# Patient Record
Sex: Female | Born: 1935
Health system: Southern US, Community
[De-identification: ages and names within clinical notes are randomized; demographics above are authoritative.]

## PROBLEM LIST (undated history)

## (undated) DIAGNOSIS — Z13820 Encounter for screening for osteoporosis: Secondary | ICD-10-CM

## (undated) DIAGNOSIS — E039 Hypothyroidism, unspecified: Secondary | ICD-10-CM

## (undated) DIAGNOSIS — J449 Chronic obstructive pulmonary disease, unspecified: Secondary | ICD-10-CM

## (undated) DIAGNOSIS — K219 Gastro-esophageal reflux disease without esophagitis: Secondary | ICD-10-CM

## (undated) DIAGNOSIS — I2510 Atherosclerotic heart disease of native coronary artery without angina pectoris: Secondary | ICD-10-CM

## (undated) DIAGNOSIS — I10 Essential (primary) hypertension: Secondary | ICD-10-CM

## (undated) DIAGNOSIS — D649 Anemia, unspecified: Secondary | ICD-10-CM

## (undated) DIAGNOSIS — K5790 Diverticulosis of intestine, part unspecified, without perforation or abscess without bleeding: Secondary | ICD-10-CM

## (undated) DIAGNOSIS — Z1231 Encounter for screening mammogram for malignant neoplasm of breast: Secondary | ICD-10-CM

## (undated) DIAGNOSIS — R918 Other nonspecific abnormal finding of lung field: Secondary | ICD-10-CM

## (undated) DIAGNOSIS — R0609 Other forms of dyspnea: Secondary | ICD-10-CM

## (undated) DIAGNOSIS — R1033 Periumbilical pain: Secondary | ICD-10-CM

## (undated) DIAGNOSIS — M1990 Unspecified osteoarthritis, unspecified site: Secondary | ICD-10-CM

## (undated) DIAGNOSIS — R059 Cough, unspecified: Secondary | ICD-10-CM

## (undated) HISTORY — DX: Atherosclerotic heart disease of native coronary artery without angina pectoris: I25.10

## (undated) HISTORY — PX: APPENDECTOMY: SHX54

## (undated) HISTORY — DX: Gastro-esophageal reflux disease without esophagitis: K21.9

## (undated) HISTORY — DX: Chronic obstructive pulmonary disease, unspecified: J44.9

## (undated) HISTORY — DX: Essential (primary) hypertension: I10

## (undated) HISTORY — PX: CHOLECYSTECTOMY: SHX55

## (undated) HISTORY — DX: Anemia, unspecified: D64.9

## (undated) HISTORY — DX: Diverticulosis of intestine, part unspecified, without perforation or abscess without bleeding: K57.90

## (undated) HISTORY — PX: TOTAL ABDOMINAL HYSTERECTOMY: SHX209

## (undated) HISTORY — PX: NISSEN FUNDOPLICATION: SHX2091

## (undated) HISTORY — PX: COLON SURGERY: SHX602

## (undated) HISTORY — PX: CORONARY ANGIOPLASTY WITH STENT PLACEMENT: SHX49

## (undated) HISTORY — DX: Hypothyroidism, unspecified: E03.9

---

## 1981-01-31 HISTORY — PX: BREAST SURGERY: SHX581

## 2001-04-09 LAB — HIV ANTIBODY (ROUTINE TESTING W REFLEX): HIV 1&2 Ab, 4th Generation: NEGATIVE

## 2004-02-05 LAB — TSH: TSH: 2.61 (ref <=5.90)

## 2006-07-21 NOTE — H&P (Unsigned)
[facility]   [address]   [place], [place]. [address]   [phone]     HISTORY AND PHYSICAL    NAME: Natalie Moon, Natalie Moon MR: [id number]  [place]  LOC: SEXEduardo Osier: [id number]  DOB: 21-Sep-1929 AGE: 73 PT: I  ADMIT: DSCH: MSV: SUR    DATE OF ADMISSION: 08/07/06    REASON FOR ADMISSION: Dysphagia related to a hiatal hernia with  paraesophageal component.    HISTORY: This is a 73 year old female who was originally referred to  our office by Dr. Jonell Cluck of gastroenterology for evaluation of a hiatal  hernia with a paraesophageal component. The patient had a CT scan done  on 06/14/06 which showed a moderate-sized hiatal hernia with a  paraesophageal component. She had an EGD done by Dr. Jonell Cluck the results  of which showed a hiatal hernia and gastritis. The patient then had a  barium esophagram done on 07/17/06 which showed a moderate-sized hiatal  hernia with paraesophageal component. The patient reports that she has  been having nausea and dysphagia. She says she can only eat a few bites  and then becomes full. She denies vomiting but regurgitates quite a bit.  She says that she eats and then she starts to cough and gets choked up  after eating. She regurgitates when she bends over or at night. She has  several episodes of laryngitis per year. She denies any problem with  bowel movement and apparently had a CT enterography as per Dr. Margarita Rana  office which showed no problems with the small bowel. The patient was  referred to our office for evaluation and I recommended a laparoscopic  possible open hiatal hernia repair as well as floppy Nissen  fundoplication.    PAST MEDICAL HISTORY: Significant for arthritis, gastroesophageal reflux  disease, hypertension, hyperlipidemia. She denies diabetes, coronary  artery disease, CVA or seizure disorder.    PAST SURGICAL HISTORY: She has had a tonsillectomy, hysterectomy, and  both of her ovaries removed later during the 70's. She has had a   cholecystectomy, colon resection in 2004 and surgery for a broken arm in  2006.    ALLERGIES: No known drug allergies. No reaction to penicillin.    MEDICATIONS:   1. Nexium   2. Celebrex   3. Atenolol   4. Fluoxetine   5. Zetia   6. ____________   7. Tramadol    She could not remember any of the doses.    She denies any tobacco or alcohol use.    General appearance is a well-developed, well-nourished elderly white  female presently in no acute distress. Heart sinus rhythm, S1 and S2  normal. The lungs are clear with no rales, rhonchi or wheezing noted at  this time. Abdomen was soft, mild epigastric tenderness. Well-healed  midline incision and well-healed lower abdominal incision. Active bowel  sounds were heard on auscultation.    ASSESSMENT: Hiatal hernia with paraesophageal component which is most  likely producing her nausea, dysphagia and regurgitation.    PLAN: I recommended laparoscopic possible open hiatal hernia repair with  a floppy Nissen fundoplication. I went through the procedures, the risks  of bleeding, infection, anesthesia, injury to the esophagus, stomach,  small bowel, colon and spleen. I told her that if the spleen were  injured it may require removal. I also went through some of the potential  problems after surgery such as gas-bloat syndrome, poor gastric emptying  and the potential need for further surgery. I went through a  post-operative diet recommending six small meals and avoidance of  carbonated beverages. The patient understood and wished to proceed and  the procedure has been scheduled for 08/07/06.                 __________________________________   Milda Smart. Dareen Piano, MD A           This is an unverified document unless signed by physician.    TID: cls DT: 07/21/2006 11:20 A  JOB: [id number] DOC#: [id number] DD: 07/21/2006     cc: Milda Smart. Dareen Piano, MD

## 2006-08-02 LAB — METABOLIC PANEL, BASIC
Anion gap: 7 (ref 7–16)
BUN: 19 MG/DL (ref 8–23)
CO2: 29 MMOL/L (ref 21–32)
Calcium: 9 MG/DL (ref 8.4–10.4)
Chloride: 100 MMOL/L (ref 98–107)
Creatinine: 0.9 MG/DL (ref 0.6–1.0)
GFR est AA: >60 mL/min/{1.73_m2} (ref >60)
GFR est non-AA: >60 mL/min/{1.73_m2} (ref >60)
Glucose: 82 MG/DL (ref 74–106)
Potassium: 3.8 MMOL/L (ref 3.5–5.1)
Sodium: 136 MMOL/L (ref 136–145)

## 2006-08-02 LAB — CBC W/O DIFF
HCT: 36.5 % (ref 35.6–45.0)
HGB: 12.5 g/dL (ref 11.7–15.0)
MCH: 32 PG (ref 26.1–32.9)
MCHC: 34.2 g/dL (ref 31.4–35.0)
MCV: 93.4 FL (ref 79.6–97.8)
MPV: 8 FL — ABNORMAL LOW (ref 9.3–12.9)
PLATELET: 235 10*3/uL (ref 140–440)
RBC: 3.91 M/uL (ref 3.86–5.18)
RDW: 13.4 % (ref 11.9–14.6)
WBC: 4.3 10*3/uL — ABNORMAL LOW (ref 4.5–10.5)

## 2006-08-07 ENCOUNTER — Inpatient Hospital Stay
Admit: 2006-08-07 | Discharge: 2006-08-10 | Disposition: A | Discharge status: Home / Self Care | Source: Home / Self Care | Attending: Surgery | Admitting: Surgery

## 2006-08-07 MED ORDER — HYDROMORPHONE 2 MG/ML INJECTION SOLUTION
2 mg/mL | INTRAMUSCULAR | Status: DC | PRN
Start: 2006-08-07 — End: 2006-08-07
  Administered 2006-08-07 (×4): via INTRAVENOUS

## 2006-08-07 MED ORDER — KETOROLAC TROMETHAMINE 30 MG/ML INJECTION
30 mg/mL (1 mL) | INTRAMUSCULAR | Status: DC | PRN
Start: 2006-08-07 — End: 2006-08-07
  Administered 2006-08-07: 17:00:00 via INTRAVENOUS

## 2006-08-07 MED ORDER — HYDROMORPHONE 2 MG/ML INJECTION SOLUTION
2 mg/mL | INTRAMUSCULAR | Status: DC | PRN
Start: 2006-08-07 — End: 2006-08-07
  Administered 2006-08-07 (×2): via INTRAVENOUS

## 2006-08-07 MED ORDER — KETOROLAC TROMETHAMINE 30 MG/ML INJECTION
30 mg/mL (1 mL) | Freq: Four times a day (QID) | INTRAMUSCULAR | Status: DC | PRN
Start: 2006-08-07 — End: 2006-08-09

## 2006-08-07 MED ORDER — LACTATED RINGERS IV
INTRAVENOUS | Status: DC
Start: 2006-08-07 — End: 2006-08-07

## 2006-08-07 MED ORDER — D5-1/2 NS & POTASSIUM CHLORIDE 20 MEQ/L IV
20 mEq/L | INTRAVENOUS | Status: DC
Start: 2006-08-07 — End: 2006-08-09
  Administered 2006-08-07 – 2006-08-09 (×3): via INTRAVENOUS

## 2006-08-07 MED ORDER — TRAMADOL 50 MG TAB
50 mg | Freq: Every day | ORAL | Status: DC
Start: 2006-08-07 — End: 2006-08-07

## 2006-08-07 MED ORDER — HYDROMORPHONE 2 MG/ML INJECTION SOLUTION
2 mg/mL | INTRAMUSCULAR | Status: DC | PRN
Start: 2006-08-07 — End: 2006-08-10
  Administered 2006-08-07 – 2006-08-09 (×6): via INTRAVENOUS

## 2006-08-07 MED ORDER — PANTOPRAZOLE 40 MG IV SOLR
40 mg | INTRAVENOUS | Status: DC
Start: 2006-08-07 — End: 2006-08-09
  Administered 2006-08-07 – 2006-08-09 (×2): via INTRAVENOUS

## 2006-08-07 MED ORDER — PHENOL-PHENOLATE SODIUM AEROSOL
Status: DC | PRN
Start: 2006-08-07 — End: 2006-08-10
  Administered 2006-08-07: via ORAL

## 2006-08-07 MED ORDER — SYNTHETIC CONJ ESTROGENS A 0.625 MG TAB
0.625 mg | Freq: Every day | ORAL | Status: DC
Start: 2006-08-07 — End: 2006-08-10
  Administered 2006-08-10: 13:00:00 via ORAL

## 2006-08-07 MED ORDER — OXYCODONE-ACETAMINOPHEN 5 MG-325 MG TAB
5-325 mg | ORAL | Status: DC | PRN
Start: 2006-08-07 — End: 2006-08-10
  Administered 2006-08-09 – 2006-08-10 (×3): via ORAL

## 2006-08-07 MED ORDER — ENOXAPARIN 40 MG/0.4 ML SUB-Q SYRINGE
40 mg/0.4 mL | Freq: Every day | SUBCUTANEOUS | Status: DC
Start: 2006-08-07 — End: 2006-08-10
  Administered 2006-08-07 – 2006-08-10 (×4): via SUBCUTANEOUS

## 2006-08-07 MED ORDER — CEFAZOLIN 1 GRAM SOLUTION FOR INJECTION
1 gram | Freq: Three times a day (TID) | INTRAMUSCULAR | Status: AC
Start: 2006-08-07 — End: 2006-08-08
  Administered 2006-08-07 – 2006-08-08 (×3): via INTRAVENOUS

## 2006-08-07 MED ORDER — LIDOCAINE (PF) 20 MG/ML (2 %) IV SYRINGE
100 mg/5 mL (2 %) | INTRAVENOUS | Status: DC | PRN
Start: 2006-08-07 — End: 2006-08-07

## 2006-08-07 MED ORDER — FLUOXETINE 20 MG CAP
20 mg | Freq: Two times a day (BID) | ORAL | Status: DC
Start: 2006-08-07 — End: 2006-08-10
  Administered 2006-08-08 (×2): via ORAL
  Administered 2006-08-09: 22:00:00
  Administered 2006-08-09 – 2006-08-10 (×3): via ORAL

## 2006-08-07 MED ORDER — HYDROCODONE-ACETAMINOPHEN 10 MG-500 MG TAB
10-500 mg | ORAL | Status: DC | PRN
Start: 2006-08-07 — End: 2006-08-07

## 2006-08-07 MED ORDER — OXYCODONE-ACETAMINOPHEN 5 MG-325 MG TAB
5-325 mg | ORAL | Status: DC | PRN
Start: 2006-08-07 — End: 2006-08-10

## 2006-08-07 MED ORDER — ATENOLOL 25 MG TAB
25 mg | Freq: Every day | ORAL | Status: DC
Start: 2006-08-07 — End: 2006-08-09
  Administered 2006-08-08: 14:00:00 via ORAL

## 2006-08-07 MED ORDER — PROMETHAZINE 25 MG/ML INJECTION
25 mg/mL | INTRAMUSCULAR | Status: DC | PRN
Start: 2006-08-07 — End: 2006-08-07

## 2006-08-07 MED ORDER — ATROPINE 0.4 MG/ML IJ SOLN
0.4 mg/mL | INTRAMUSCULAR | Status: DC | PRN
Start: 2006-08-07 — End: 2006-08-07

## 2006-08-07 MED ADMIN — cefazolin (ANCEF) 1 gram/50 mL IVPB: INTRAVENTRICULAR_CARDIAC | @ 12:00:00 | NDC 63323023765

## 2006-08-07 MED ADMIN — midazolam (VERSED) 1 mg/mL injection: INTRAVENOUS | @ 12:00:00 | NDC 66758001802

## 2006-08-07 MED FILL — HYDROMORPHONE 2 MG/ML INJECTION SOLUTION: 2 mg/mL | INTRAMUSCULAR | Qty: 1

## 2006-08-07 MED FILL — TRAMADOL 50 MG TAB: 50 mg | ORAL | Qty: 1

## 2006-08-07 MED FILL — SORE THROAT SPRAY: Qty: 177

## 2006-08-07 MED FILL — D5-1/2 NS & POTASSIUM CHLORIDE 20 MEQ/L IV: 20 mEq/L | INTRAVENOUS | Qty: 1000

## 2006-08-07 MED FILL — LOVENOX 40 MG/0.4 ML SUBCUTANEOUS SYRINGE: 40 mg/0.4 mL | SUBCUTANEOUS | Qty: 0.4

## 2006-08-07 MED FILL — SODIUM CHLORIDE 0.9 % IV PIGGY BACK: INTRAVENOUS | Qty: 50

## 2006-08-07 MED FILL — FLUOXETINE 20 MG CAP: 20 mg | ORAL | Qty: 1

## 2006-08-07 MED FILL — CEFAZOLIN 1 GRAM SOLUTION FOR INJECTION: 1 gram | INTRAMUSCULAR | Qty: 1000

## 2006-08-07 MED FILL — ATENOLOL 25 MG TAB: 25 mg | ORAL | Qty: 2

## 2006-08-07 MED FILL — CEFAZOLIN 1 GRAM/50 ML IN DEXTROSE (ISO-OSMOTIC) IVPB: 1 gram/50 mL | INTRAVENOUS | Qty: 1

## 2006-08-07 MED FILL — MIDAZOLAM 1 MG/ML IJ SOLN: 1 mg/mL | INTRAMUSCULAR | Qty: 2

## 2006-08-07 MED FILL — ROXICET 5 MG-325 MG TABLET: 5-325 mg | ORAL | Qty: 1

## 2006-08-07 MED FILL — PROTONIX 40 MG INTRAVENOUS SOLUTION: 40 mg | INTRAVENOUS | Qty: 40

## 2006-08-07 MED FILL — KETOROLAC TROMETHAMINE 30 MG/ML INJECTION: 30 mg/mL (1 mL) | INTRAMUSCULAR | Qty: 1

## 2006-08-07 MED FILL — ROXICET 5 MG-325 MG TABLET: 5-325 mg | ORAL | Qty: 2

## 2006-08-07 NOTE — Progress Notes (Unsigned)
[facility]   One [address]   [place], [place]. [address]   [phone]     OPERATIVE REPORT    NAME: Genie, Wenke MR: [id number]  Mason  LOC: [id number] SEX: F ACCT: [id number]  DOB: September 12, 1929 AGE: 73 PT: I  ADMIT: 08/07/2006 DSCH: MSV: MED    DATE OF PROCEDURE: 08/07/2006    PREOPERATIVE DIAGNOSIS: Hiatal hernia with a paraesophageal component  plus dysphagia.    PROCEDURE: Laparoscopic hiatal hernia repair with Nissen fundoplication  (floppy Nissen).    SURGEON: Milda Smart. Dareen Piano, MD    ANESTHESIA: General endotracheal anesthesia.    ESTIMATED BLOOD LOSS: 100 mL.    SPECIMENS: None.    INDICATIONS FOR PROCEDURE: This is a 73 year old female who was referred  to my office by Dr. Jonell Cluck of gastroenterology for evaluation of a  hiatal hernia with paraesophageal component. CT scan showed moderate  hiatal hernia with paraesophageal component. She had an  esophagogastroduodenoscopy done which showed a hiatal hernia and  gastritis. Barium swallow done showed a moderate sized hiatal hernia  with paraesophageal component. She had been having nausea as well as  dysphagia. She states she could eat only a few bites and then became  full. She regurgitated quite a bit but denied any vomiting. She was  mostly afraid to eat. I recommended laparoscopic, possible open, hiatal  hernia repair with Nissen fundoplication. I went through the procedure,  the risks of bleeding, infection, anesthesia, and injury to the  esophagus, stomach, spleen, liver, small bowel, and large bowel. I went  through the potential for swelling, distention, and poor emptying of the  stomach. I went through the potential for postoperative nausea and  vomiting. I went through the potential for conversion to an open  procedure. The patient agreed to the procedure, signed a consent form  and was scheduled for today.    DESCRIPTION OF PROCEDURE: The patient was brought to the operating room   and placed on the operating table in the supine position where general  endotracheal anesthesia was administered without complications. She  received a gram of Ancef as prophylactic antibiotic coverage. Foley  catheter was inserted by the nursing staff. The abdomen was prepped and  draped in the usual sterile manner. Incision was made superior into the  patient's left of the umbilicus overlying the left rectus abdominus  muscle. An Optiview trocar with a 0-degree 10 mm scope was placed into  the peritoneal cavity under direct vision. The abdomen was insufflated  to 15 mmHg using CO2 gas after which a 45-degree laparoscope was inserted  into the abdomen. Under direct vision we placed a 5 mm and a 10 mm  trocar in the right upper quadrant. This was followed by a 5 mm trocar  in the left upper quadrant. The 5 mm trocar was placed in the epigastric  region and then removed and through this tract a Nathanson liver  retractor was used to retract the left lobe of the liver throughout the  remainder of the procedure. The [facility] liver retractor was attached  to an Omni self-retaining retracting device. The patient had a large  hiatal hernia with approximately the upper one-third of the stomach up in  the thoracic cavity. Using the 5 mm Harmonic scalpel, I opened the  gastrohepatic ligament. I exposed the right cruz of the diaphragm and  divided the hiatal hernia sac along the 6 to 12 o'clock position using  the 5 mm Harmonic scalpel.    We  then turned our attention to the greater curvature of the stomach  dividing the gastrocolic ligament without problems. This was done with a  5 mm Harmonic scalpel. When we got up to the spleen one of the short  gastrics, even though we had used the Harmonic scalpel, continued to  bleed. I clipped this three times and the bleeding ceased. We then took  down the hiatal hernia sac from the 12 o'clock position down to the 6   o'clock position for circumferential take-down of the hiatal hernia sac.  Both the left and right cruz of the diaphragm were cleared of their  surrounding tissue. A 50 French bougie was placed by the nurse  anesthetist under direct vision. My assistant retracted the esophagus,  and I closed the hiatal hernia repair with 3-0 Ethibond sutures using an  intracorporeal knot tying technique. We then did a 360-degree Nissen  fundoplication. This was done by bringing the fundus posterior and doing  a 360-degree wrap. The first suture was taken with 0-0 Ethibond from the  fundus to the phrenoesophageal membrane to the fundus, and this is the  first suture. Two remaining sutures for a 3 cm wrap were completed all  with 0-0 Ethibond. We did a lap instrument and needle count, and they  were all found to be correct. We irrigated and checked for evidence of  bleeding. No bleeding was noted at any of the sites. The bougie was  removed under direct vision. The [facility] liver retractor was removed  under direct vision without evidence of bleeding. All of the trocars  were removed under direct vision without bleeding at the trocar sites.  The skin incisions were closed with surgical clips. The patient had  Band-Aids placed over each wound. The patient tolerated the entire  procedure well. She was extubated and brought to the recovery room in  stable condition.            ____________________________________David G. Dareen Piano, MD A     This is an unverified document unless signed by physician.    TID: rhj DT: 08/07/2006 7:39 P  JOB: [id number] DOC#: [id number] DD: 08/07/2006    cc: Milda Smart. Dareen Piano, MD   Wilford Corner, MD

## 2006-08-08 LAB — METABOLIC PANEL, BASIC
Anion gap: 4 — ABNORMAL LOW (ref 7–16)
BUN: 13 MG/DL (ref 8–23)
CO2: 31 MMOL/L (ref 21–32)
Calcium: 8.5 MG/DL (ref 8.4–10.4)
Chloride: 102 MMOL/L (ref 98–107)
Creatinine: 0.9 MG/DL (ref 0.6–1.0)
GFR est AA: >60 mL/min/{1.73_m2} (ref >60)
GFR est non-AA: >60 mL/min/{1.73_m2} (ref >60)
Glucose: 114 MG/DL — ABNORMAL HIGH (ref 74–106)
Potassium: 4.7 MMOL/L (ref 3.5–5.1)
Sodium: 137 MMOL/L (ref 136–145)

## 2006-08-08 LAB — CBC W/O DIFF
HCT: 32.6 % — ABNORMAL LOW (ref 35.6–45.0)
HGB: 11 g/dL — ABNORMAL LOW (ref 11.7–15.0)
MCH: 31.2 PG (ref 26.1–32.9)
MCHC: 33.7 g/dL (ref 31.4–35.0)
MCV: 92.8 FL (ref 79.6–97.8)
MPV: 7.9 FL — ABNORMAL LOW (ref 9.3–12.9)
PLATELET: 204 10*3/uL (ref 140–440)
RBC: 3.51 M/uL — ABNORMAL LOW (ref 3.86–5.18)
RDW: 13.5 % (ref 11.9–14.6)
WBC: 7 10*3/uL (ref 4.5–10.5)

## 2006-08-08 MED ORDER — ALPRAZOLAM 0.5 MG TAB
0.5 mg | Freq: Three times a day (TID) | ORAL | Status: DC | PRN
Start: 2006-08-08 — End: 2006-08-10
  Administered 2006-08-08: 17:00:00 via ORAL

## 2006-08-08 MED ORDER — VALSARTAN 80 MG TAB
80 mg | Freq: Every day | ORAL | Status: DC | PRN
Start: 2006-08-08 — End: 2006-08-10

## 2006-08-08 MED FILL — FLUOXETINE 20 MG CAP: 20 mg | ORAL | Qty: 1

## 2006-08-08 MED FILL — CEFAZOLIN 1 GRAM SOLUTION FOR INJECTION: 1 gram | INTRAMUSCULAR | Qty: 1000

## 2006-08-08 MED FILL — ATENOLOL 25 MG TAB: 25 mg | ORAL | Qty: 2

## 2006-08-08 MED FILL — HYDROMORPHONE 2 MG/ML INJECTION SOLUTION: 2 mg/mL | INTRAMUSCULAR | Qty: 1

## 2006-08-08 MED FILL — ALPRAZOLAM 0.5 MG TAB: 0.5 mg | ORAL | Qty: 2

## 2006-08-08 MED FILL — SODIUM CHLORIDE 0.9 % IV PIGGY BACK: INTRAVENOUS | Qty: 50

## 2006-08-08 MED FILL — LACTATED RINGERS IV: INTRAVENOUS | Qty: 1000

## 2006-08-08 MED FILL — D5-1/2 NS & POTASSIUM CHLORIDE 20 MEQ/L IV: 20 mEq/L | INTRAVENOUS | Qty: 1000

## 2006-08-08 MED FILL — BUPIVACAINE (PF) 0.5 % (5 MG/ML) IJ SOLN: 0.5 % (5 mg/mL) | INTRAMUSCULAR | Qty: 30

## 2006-08-08 MED FILL — LOVENOX 40 MG/0.4 ML SUBCUTANEOUS SYRINGE: 40 mg/0.4 mL | SUBCUTANEOUS | Qty: 0.4

## 2006-08-09 LAB — METABOLIC PANEL, BASIC
Anion gap: 7 (ref 7–16)
BUN: 8 MG/DL (ref 8–23)
CO2: 28 MMOL/L (ref 21–32)
Calcium: 8.2 MG/DL — ABNORMAL LOW (ref 8.4–10.4)
Chloride: 103 MMOL/L (ref 98–107)
Creatinine: 0.7 MG/DL (ref 0.6–1.0)
GFR est AA: >60 mL/min/{1.73_m2} (ref >60)
GFR est non-AA: >60 mL/min/{1.73_m2} (ref >60)
Glucose: 112 MG/DL — ABNORMAL HIGH (ref 74–106)
Potassium: 3.8 MMOL/L (ref 3.5–5.1)
Sodium: 138 MMOL/L (ref 136–145)

## 2006-08-09 LAB — CBC W/O DIFF
HCT: 29.4 % — ABNORMAL LOW (ref 35.6–45.0)
HGB: 10 g/dL — ABNORMAL LOW (ref 11.7–15.0)
MCH: 31.6 PG (ref 26.1–32.9)
MCHC: 34.1 g/dL (ref 31.4–35.0)
MCV: 92.8 FL (ref 79.6–97.8)
MPV: 8.3 FL — ABNORMAL LOW (ref 9.3–12.9)
PLATELET: 174 10*3/uL (ref 140–440)
RBC: 3.17 M/uL — ABNORMAL LOW (ref 3.86–5.18)
RDW: 13.6 % (ref 11.9–14.6)
WBC: 5.7 10*3/uL (ref 4.5–10.5)

## 2006-08-09 MED ADMIN — atenolol (TENORMIN) tablet 50 mg: ORAL | @ 13:00:00 | NDC 75834028200

## 2006-08-09 MED FILL — ATENOLOL 50 MG TAB: 50 mg | ORAL | Qty: 1

## 2006-08-09 MED FILL — LOVENOX 40 MG/0.4 ML SUBCUTANEOUS SYRINGE: 40 mg/0.4 mL | SUBCUTANEOUS | Qty: 0.4

## 2006-08-09 MED FILL — PROTONIX 40 MG INTRAVENOUS SOLUTION: 40 mg | INTRAVENOUS | Qty: 40

## 2006-08-09 MED FILL — FLUOXETINE 20 MG CAP: 20 mg | ORAL | Qty: 1

## 2006-08-09 MED FILL — OXYCODONE-ACETAMINOPHEN 5 MG-325 MG TAB: 5-325 mg | ORAL | Qty: 1

## 2006-08-09 MED FILL — ATENOLOL 25 MG TAB: 25 mg | ORAL | Qty: 2

## 2006-08-09 MED FILL — D5-1/2 NS & POTASSIUM CHLORIDE 20 MEQ/L IV: 20 mEq/L | INTRAVENOUS | Qty: 1000

## 2006-08-09 MED FILL — HYDROMORPHONE 2 MG/ML INJECTION SOLUTION: 2 mg/mL | INTRAMUSCULAR | Qty: 1

## 2006-08-10 MED ORDER — PANTOPRAZOLE 40 MG TAB, DELAYED RELEASE
40 mg | Freq: Every day | ORAL | Status: DC
Start: 2006-08-10 — End: 2006-08-10
  Administered 2006-08-10: 12:00:00 via ORAL

## 2006-08-10 MED ADMIN — zolpidem (AMBIEN) tablet 10 mg: ORAL | @ 02:00:00 | NDC 76420023360

## 2006-08-10 MED FILL — ZOLPIDEM 5 MG TAB: 5 mg | ORAL | Qty: 2

## 2006-08-10 MED FILL — LOVENOX 40 MG/0.4 ML SUBCUTANEOUS SYRINGE: 40 mg/0.4 mL | SUBCUTANEOUS | Qty: 0.4

## 2006-08-10 MED FILL — PROTONIX 40 MG TABLET,DELAYED RELEASE: 40 mg | ORAL | Qty: 1

## 2006-08-10 MED FILL — FLUOXETINE 20 MG CAP: 20 mg | ORAL | Qty: 1

## 2006-08-10 MED FILL — ATENOLOL 50 MG TAB: 50 mg | ORAL | Qty: 1

## 2006-08-10 MED FILL — OXYCODONE-ACETAMINOPHEN 5 MG-325 MG TAB: 5-325 mg | ORAL | Qty: 1

## 2006-08-11 LAB — GLUCOSE, POC: Glucose (POC): 110 mg/dL (ref 70–110)

## 2006-09-26 NOTE — Discharge Summary (Unsigned)
[facility]   One [address]   [place], [place]. [address]   [phone]     DISCHARGE SUMMARY    NAME: Natalie Moon, Natalie Moon MR: [id number]  LOC: 02 [id number] SEX: F ACCT: [id number]  DOB: 10/19/1929 AGE: 73 PT: I  ADMIT: 08/07/2006 DSCH: 08/10/2006 MSV: MED    ADMITTING DIAGNOSIS: Hiatal hernia with a paraesophageal component plus  dysphagia.    OPERATIONS/PROCEDURES: Laparoscopic hiatal hernia repair with Nissen  fundoplication.    FINDINGS AT THE TIME OF SURGERY: Hiatal hernia with one-third of the  stomach in the thoracic cavity.    POSTOPERATIVE DIAGNOSIS:  1. Hiatal hernia with paraesophageal component plus dysphagia.  2. Postoperative anxiety.    HISTORY OF PRESENT ILLNESS AND HOSPITAL COURSE: The patient is a  73 year old who I was referred for a large hiatal hernia. She was having  dysphagia. She went through the usual workup of  esophagogastroduodenoscopy, barium swallow, and she also had some other  tests done. I recommended repair of this. It was scheduled for  08/07/2006 and was done without problems. This was laparoscopic hiatal  hernia repair with Nissen fundoplication. After surgery, she reported  some incisional tenderness. There was no dysphagia, nausea, or vomiting  noted. I started her on clear liquids. I discontinued her oxygen. We  got her out of bed. We had her ambulate. Physical therapy consultation  was ordered. We renewed her home medications. She was quite tearful and  anxious the first day after surgery. She thought she was going to die.  We started her on some antianxiety medications. We had the chaplain see  her. I came by and saw her. I asked her family to come in and visit  with her. When I saw her the next day, she said that she felt like she  had a panic attack and felt better after taking some Ativan. On  postoperative day number two, I gave her a soft diet. I discontinued her  intravenous fluids. On postoperative day number three, she felt better.   She wanted to go home. She was sent home on 08/10/2006.    DISCHARGE INSTRUCTIONS: The patient is discharged to home.    DISCHARGE CONDITION: Stable.    DISCHARGE DIET: Six small meals per day.    FOLLOWUP INTERVAL: She is to follow up with me on 08/15/2006.    DISCHARGE MEDICATIONS:  1. Percocet.  2. Phenergan.  3. She was told to resume all of her home medications.    DISCHARGE ACTIVITY: She was told that she could walk, climb stairs, and  shower but no tub baths, swimming, or heavy lifting.                 __________________________________   Milda Smart. Dareen Piano, MD A     This is an unverified document unless signed by physician.    TID: ach DT: 09/26/2006 11:52 A  JOB: [id number] DOC#: [id number] DD: 09/15/2006    cc: Milda Smart. Dareen Piano, MD

## 2007-01-24 NOTE — H&P (Unsigned)
[facility] [place] [facility] [facility]   One [address]   [place], [place]. [address]   [phone]     HISTORY AND PHYSICAL    NAME: Natalie Moon, Natalie Moon MR: [id number]  Natalie Moon  LOC: EN SEXEduardo Osier: [id number]  DOB: [birth year] AGE: 73 PT: X  ADMIT: DSCH: MSV: GI    DATE OF ADMISSION: 02-08-07    REASON FOR ADMISSION: EGD secondary to dysphagia.    HISTORY:  This is a 73 year old female who was admitted in July 2008 secondary to  dysphagia. She had a large hiatal hernia with paraesophageal component  and underwent a laparoscopic hiatal hernia repair, Nissen fundoplication.  Since that time she's reported some intermittent dysphagia and inability  to eat large amounts at any one time. She's become quite concerned and I  recommended an EGD to further evaluate her esophagus and hiatal hernia  repair.    PAST MEDICAL HISTORY:   1. Arthritis.   2. Gastroesophageal reflux disease in the past.   3. Hypertension.   4. Hyperlipidemia.    She denies diabetes, coronary artery disease, CVA or seizure disorder.    PAST SURGICAL HISTORY:   1. Tonsillectomy.   2. Hysterectomy.   3. Both of her ovaries removed in the 1970's.   4. Cholecystectomy.   5. Colon resection in 2004.   6. Surgery for a broken arm in 2006.   7. Laparoscopic Nissen fundoplication with hiatal hernia repair in   July 2008.    ALLERGIES: No known drug allergies. No reaction to penicillin.  MEDICATIONS:  Nexium, Celebrex, atenolol, fluoxetine, Zetia, Tramadol. She did not  remember any of the doses.    SOCIAL HISTORY: She denies any tobacco or alcohol use.    General: well developed, well nourished elderly white female presently in  no acute distress. Heart: sinus rhythm, S1, S2 normal. Lungs are clear.  No rales, rhonchi or wheezes. Abdomen soft, non-tender, non-distended.  Well healed abdominal incisions. She had active bowel sounds on  auscultation.    ASSESSMENT: Dysphagia after hiatal hernia repair and Nissen  fundoplication.    PLAN:   I recommended an EGD to rule out stricture, recurrent hiatal hernia or  other problems with her esophagus and/or stomach. Patient was agreeable  and is scheduled for 02-08-07.                 __________________________________   Milda Smart. Dareen Piano, MD A           This is an unverified document unless signed by physician.    TID: [id number] DT: 01/24/2007 10:07 A  JOB: [id number] DOC#: [id number] DD: 01/24/2007     cc: Milda Smart. Dareen Piano, MD

## 2007-02-08 MED FILL — DEMEROL (PF) 100 MG/ML INJECTION SYRINGE: 100 mg/mL | INTRAMUSCULAR | Qty: 1

## 2007-02-08 NOTE — Op Note (Unsigned)
[facility]   One [address]   [place], [place]. [address]   [phone]     OPERATIVE REPORT    NAME: Natalie Moon, Natalie Moon MR: [id number]  Mason  LOC: EN SEXEduardo Osier: [id number]  DOB: 10-14-1929 AGE: 74 PT: X  ADMIT: DSCH: MSV: GI    DATE OF PROCEDURE: 02/08/2007    PREOPERATIVE DIAGNOSIS: Dysphagia after Nissen fundoplication and hiatal  hernia repair.    POSTOPERATIVE DIAGNOSIS: No evidence of any obstruction, stricture,  problems with the hernia repair, and she also had a gastric polyp in the  prepyloric channel.    SEDATION:  1. Demerol 100 mg.  2. Versed 5 mg.    HISTORY: This is a 74 year old female who I originally saw for a large  hiatal hernia and pain due to this hiatal hernia as well as  gastroesophageal reflux disease. I recommended repair. This was done in  08/2006. Intermittently since that time she has been having some  dysphagia. She says she cannot eat as much as she did prior to surgery  and has been concerned. I recommended that she have a  esophagogastroduodenoscopy to evaluate her hiatal hernia repair and wrap  to see if it was too tight. The patient agreed to the procedure. I went  through the risks of bleeding, infection, anesthesia, injury to the  esophagus or stomach, and potential injury to the duodenum. The patient  agreed to procedure, signed a consent form and was scheduled for  02/08/2007.    PROCEDURE IN DETAIL: The patient was brought to the endoscopy suite.  She was placed in room four in the left lateral decubitus position on the  endoscopy stretcher. The patient had two liters of nasal oxygen applied,  and we monitored her oxygen saturations and vital signs throughout the  procedure. The patient had Cetacaine spray administered to the posterior  pharynx times two. A bite guard was placed. She received sedation with  100 mg of Demerol and 5 mg of Versed given in divided doses. Once  sedation had taken effect, an Olympus GI-160 cm scope was advanced   through the mouth, the posterior pharynx, and esophagus. The scope was  advanced through the hiatal hernia repair and wrap without difficulty.  There was no evidence of obstruction, narrowing or other problems. The  scope was advanced into the stomach. The stomach was insufflated with  air. We then advanced it through the pylorus to the second portion of  the duodenum. There was no evidence of problems in the duodenum. There  was a gastric polyp in the prepyloric channel which was biopsied times  two with biopsy forceps. The scope was then retroflexed. The wrap  appeared to be intact. There was no evidence of any problems with this  on retroflexion of the scope. The scope was withdrawn. Once again it  passed easily through the hiatal hernia repair and fundoplication wrap.  There was no evidence of any abnormalities in the distal esophagus.  Overall everything appeared to be intact except for this gastric polyp.    PLAN: I recommended she follow up with me on 02/15/2007 for the biopsy  results. NO other abnormalities were noted.            ____________________________________David G. Dareen Piano, MD A     This is an unverified document unless signed by physician.    TID: rhj DT: 02/08/2007 9:02 A  JOB: [id number] DOC#: [id number] DD: 02/08/2007    cc: Milda Smart. Dareen Piano, MD  Karn Cassis, DO

## 2008-10-08 ENCOUNTER — Observation Stay
Admit: 2008-10-08 | Discharge: 2008-10-11 | Disposition: A | Discharge status: Home / Self Care | Source: Ambulatory Visit | Attending: Internal Medicine | Admitting: Internal Medicine

## 2008-10-08 LAB — METABOLIC PANEL, BASIC
Anion gap: 6 mmol/L — ABNORMAL LOW (ref 7–16)
BUN: 19 MG/DL (ref 8–23)
CO2: 27 MMOL/L (ref 21–32)
Calcium: 8.8 MG/DL (ref 8.4–10.4)
Chloride: 103 MMOL/L (ref 98–107)
Creatinine: 1 MG/DL (ref 0.6–1.0)
GFR est AA: >60 mL/min/{1.73_m2} (ref >60)
GFR est non-AA: 57 mL/min/{1.73_m2} — ABNORMAL LOW (ref >60)
Glucose: 97 MG/DL (ref 82–115)
Potassium: 4.8 MMOL/L (ref 3.5–5.1)
Sodium: 136 MMOL/L (ref 136–145)

## 2008-10-08 LAB — T4, FREE: T4, Free: 0.9 NG/DL (ref 0.9–1.76)

## 2008-10-08 LAB — CBC WITH AUTOMATED DIFF
ABS. BASOPHILS: 0 10*3/uL (ref 0.0–0.2)
ABS. EOSINOPHILS: 0 10*3/uL (ref 0.0–0.8)
ABS. IMM. GRANS.: 0 10*3/uL (ref 0.0–2.0)
ABS. LYMPHOCYTES: 0.9 10*3/uL (ref 0.5–4.6)
ABS. MONOCYTES: 0.4 10*3/uL (ref 0.1–1.3)
ABS. NEUTROPHILS: 4.8 10*3/uL (ref 1.7–8.2)
BASOPHILS: 0 % (ref 0.0–2.0)
EOSINOPHILS: 1 % (ref 0.5–7.8)
HCT: 37.3 % — ABNORMAL LOW (ref 37.6–48.3)
HGB: 11.9 g/dL (ref 11.7–15.0)
IMMATURE GRANULOCYTES: 0 % (ref 0.0–2.0)
LYMPHOCYTES: 14 % (ref 13–44)
MCH: 30.6 PG (ref 26.1–32.9)
MCHC: 31.9 g/dL (ref 31.4–35.0)
MCV: 95.9 FL (ref 79.6–97.8)
MONOCYTES: 7 % (ref 4.0–12.0)
MPV: 11.2 FL (ref 10.8–14.1)
NEUTROPHILS: 78 % (ref 43–78)
PLATELET: 230 10*3/uL (ref 140–440)
RBC: 3.89 M/uL (ref 3.86–5.18)
RDW: 13.6 % (ref 11.9–14.6)
WBC: 6.1 10*3/uL (ref 4.0–10.5)

## 2008-10-08 LAB — TROPONIN I: Troponin-I, Qt.: <0.04 NG/ML — ABNORMAL LOW (ref 0.04–0.05)

## 2008-10-08 MED ADMIN — pantoprazole (PROTONIX) injection 40 mg: INTRAVENOUS | @ 20:00:00 | NDC 00008092351

## 2008-10-08 MED ADMIN — dextrose 5% - 0.45% NaCl with KCl 30 mEq/L infusion: INTRAVENOUS | @ 20:00:00 | NDC 00409790309

## 2008-10-08 MED ADMIN — ioversol (OPTIRAY) 350 mg/mL contrast solution 70 mL: INTRAVENOUS | @ 21:00:00 | NDC 00019133311

## 2008-10-08 MED ADMIN — fluoxetine (PROZAC) capsule 20 mg: ORAL | @ 22:00:00 | NDC 51079097101

## 2008-10-08 MED ADMIN — cholecalciferol (vitamin d3) (VITAMIN D3) tablet 2,000 Units: ORAL | @ 20:00:00 | NDC 53191040901

## 2008-10-08 MED FILL — DEXTROSE 5%-1/2 NORMAL SALINE IV: INTRAVENOUS | Qty: 1000

## 2008-10-08 MED FILL — FLUOXETINE 20 MG CAP: 20 mg | ORAL | Qty: 1

## 2008-10-08 MED FILL — D5-1/2 NS & POTASSIUM CHLORIDE 30 MEQ/L IV: 30 mEq/L | INTRAVENOUS | Qty: 1000

## 2008-10-08 MED FILL — PROTONIX 40 MG INTRAVENOUS SOLUTION: 40 mg | INTRAVENOUS | Qty: 40

## 2008-10-08 MED FILL — CHOLECALCIFEROL (VITAMIN D3) 1,000 UNIT (25 MCG) TAB: ORAL | Qty: 2

## 2008-10-08 NOTE — H&P (Signed)
[facility] [place] [facility]   [address]   [place], [place]. [address]   [phone]     HISTORY AND PHYSICAL    NAME: Maritta, Kief MR: [id number]  [place]  LOC: [id number] SEX: F ACCT: [id number]  DOB: Oct 11, 1929 AGE: 75 PT: I  ADMIT: 10/08/2008 DSCH: MSV: MED      HISTORY OF PRESENT ILLNESS: The patient is a pleasant but anxious  75 year old Caucasian woman with multiple medical problems with an  overriding generalized anxiety and periodically worsening depressive  symptoms. She appears to have paroxysmal episodes of panic and near  syncopal episodes associated with profound weakness and diaphoresis.  While in the waiting room at [facility], she was noted early  this afternoon to have one of these episodes. During this episode, she  was tremulous, diaphoretic, anxious, with difficulty speaking because of  tremulous and shallow voice. A stat blood sugar done in the office showed  a glucose level of 77. She was given orange juice with little to no  improvement of her symptomatology. Her symptomatology continued until she  left by ambulance for [facility]. She had a gentleman friend  waiting for her in the car and we brought him into the office and  explained the situation and we have been in touch with her daughter as  well. During her evaluation, her blood pressure was normal at 136/82 and  a heart rate of 68. She had no neurological abnormalities noted other  than some tremulousness and hypophonia. She had no neurological deficit.  She had no seizure-like activity. Her cognition seemed to be slowed at  times. She was observed for a period of an hour and examined thoroughly.  She had no evidence of focal neurological deficit and no cardiovascular  signs other than a rapid respiratory rate of 28 and diaphoresis. The  patient had no complaint of anything specific and denies pain, nausea,  and any recent change or discontinuation of medication.     PERSONAL HABITS: No tobacco or alcohol use. No intravenous drug abuse.  Occupational history: Retired, no occupational exposures.    PAST MEDICAL HISTORY: Degenerative arthritis, sleep disturbance,  depression, anxiety, gastroesophageal reflux, hyperlipidemia, hormone  deficiency, diverticulosis, fibrocystic breast disease.    ALLERGIES: NO KNOWN DRUG ALLERGIES. THERE SEEMS TO BE SOME QUESTION AS  TO WHETHER SHE MIGHT BE ALLERGIC TO ASPIRIN OR HAS SOME ADVERSE EFFECT  FROM ASPIRIN. SHE IS NOT TAKING ASPIRIN AT THIS TIME.    She denies any chest pain, shortness of breath, or palpitations at this  time.    PAST SURGICAL HISTORY: Hysterectomy, cholecystectomy, appendectomy,  tonsillectomy, and adenoidectomy, partial colon resection.    FAMILY HISTORY: Noncontributory. No rheumatoid arthritis. No premature  heart disease, no coagulopathy.    REVIEW OF SYSTEMS: The patient experienced abrupt onset of anxiety,  diaphoresis, a sense of panic, and shortness of breath. She has denied  any headache, neck pain, or pain of any type. She denied any recent  change in bowel habits of urinary habits. No history of thyroid disease,  diabetes. No history of lung, liver, or kidney disease. No history of  stroke, seizure, or tremor. No history of dementia. There is psychiatric  history of depression and anxiety. There is a history of gastroesophageal  reflux disease, hyperlipidemia, and depression. She has not noticed any  chills or fever recently. She did have a similar episode approximately 2  to 3 weeks ago per patient that was unexplained. She called EMS and  EMS  recommended her for hospitalization and she declined.    MEDICATIONS  1. Celebrex 200 p.o. b.i.d.  2. Ambien 10 mg 1/2 to 1 p.o. at bedtime.  3. Ultram 50 mg 1 tablet to 2 tablets every 6 hours p.r.n. arthritis  pain.  4. Atenolol 50 mg p.o. daily.  5. Klonopin 0.5 mg p.o. b.i.d.  6. Nexium 40 mg p.o. daily.  7. Prozac 20 mg p.o. b.i.d.  8. Zetia 10 mg p.o. daily.     PHYSICAL EXAMINATION  VITAL SIGNS: Respirations 28, diaphoretic, repeat blood pressure 130/72,  heart rate 64.  GENERAL: No cyanosis. No jaundice. No JVD. Sitting at a 45-degree  angle.  HEART: Regular without rub, gallop, or murmur and no carotid bruits.  NECK: Supple.  HEENT: Pupils are equal and reactive to light. Extraocular muscles  intact. Nares patent without discharge. Oropharynx clear. There are moist  mucous membranes. No thyroid enlargement of tenderness. No cervical,  axillary, or inguinal lymphadenopathy.  ABDOMEN: Soft, nontender, nondistended. Active bowel sounds and no  abdominal bruit. No flank ecchymoses.  MUSCULOSKELETAL: There is no spinal tenderness. No joint swelling.  EXTREMITIES: There is some generalized lower extremity edema. She  complains of right thigh trauma after a fall in the recent past, but no  distal swelling.  SKIN: There is no petechiae. There are no signs of infection.  NEUROLOGIC: She is anxious but no rigors per se. No loss of  consciousness. Cranial nerves 3-7 intact. No focal deficit. Deep tendon  reflexes intact and symmetric. Initial gait and balance appear normal.  Her speech was initially normal and then as her anxiety increased, her  voice seemed to fade in volume.    Again, her blood sugar was 77 during this episode. Her vital signs are  normal except for rapid respiratory rate.      LUNGS: Clear on auscultation bilaterally without rub, wheeze, or  rhonchi.    IMPRESSION  1. Acute onset of presyncope, faintness, weakness and diaphoresis with  dyspnea of uncertain etiology. She has had spells of this in the past.  These paroxysmal episodes usually resolve after an hour. They were  described by the patient as panic and a problem with breathing. She had a  glucose tolerance test to see if she was prone to postprandial  hypoglycemia. There was no hypoglycemia on a 3-hour glucose tolerance  test and there was only minimal elevation of blood sugar at 2 hours of   147. There is no history of coagulopathy. There is a history of fall and  trauma to the right hip and thigh area. Because of her presyncope,  unexplained dyspnea, I have recommended CT of the chest to exclude the  possibility of insidious pulmonary embolism causing paroxysmal episodes  of dyspnea.  2. Panic and anxiety by history. No physiological cause noted at present.  She is on a good deal of serotonin-mimetic agents, which could be  problematic; however, she has been taking this for quite some time and no  dosage changes have occurred. Will consult psychiatry should symptoms  persist.  3. General osteoarthritis, multiple sites. Celebrex 200 mg p.o. b.i.d.  has been well-tolerated in the past and we will continue this as needed.  4. Gastroesophageal reflux, on Nexium 40 mg p.o. daily. This is not  available at the [facility] and we will begin Protonix.  5. Sleep disturbance for which she has been prescribed Ambien as an  outpatient. She may require some anxiolytics such as Ativan  or Atarax  along with her routine medications during hospitalization.  6. Hormone deficiency with a history of hysterectomy and advanced age.  She is not on hormone replacement therapy nor is she on Evista or any  other agents that I would suspect to cause anxiety, diaphoresis. I  suspect some of her symptoms could be related to hormone deficiency  although this would be a late onset and it is more likely that excess  serotonin-mimetic agents might be responsible.  7. History of cholecystectomy, appendectomy, tonsillectomy, and partial  colonic resection. No recent change in bowel habits.  8. See orders for plan of care described in more detail. See supportive  care and testing. I have contacted the nurse on the 5th floor where the  patient is in room 502, and he reports that a CT of the chest to rule out  PE was performed at 4:39 p.m. and there were no results reported on the   system or available for review at this time. I spoke to the nurse about  15 minutes ago and if there is any report soon, he will call. Begin  Lovenox 30 mg subcutaneously q.12 pending results.                Karn Cassis, DO     This is an unverified document unless signed by physician.    TID: [id number] DT: 10/08/2008 11:59 P  JOB: [id number] DOC#: [id number] DD: 10/08/2008     cc: Karn Cassis, DO

## 2008-10-08 NOTE — Progress Notes (Signed)
Contacted Dr. Vear Clock office, spoke with Dr. Vear Clock pertaining status of the patient. Patient has a high level of anxiety as evidenced by intermittent crying and reports from daughter of patient. Dr Vear Clock ordered celebrex 200 mg PO BID for pain, Ultram 50 mg 1 - 2 tablets every 4 hours PRN for pain, Ambien 10 mg PO at bedtime, Atarax 25 mg PO every 6 hours as needed for anxiety, Lovenox sub cutaneous injection 30 mg every 12 hours, and a psychiatric consult for panic and depression.

## 2008-10-09 LAB — FERRITIN: Ferritin: 15 NG/ML (ref 8–388)

## 2008-10-09 LAB — T3, FREE: T3,FREE: 2.7 pg/mL (ref 2.4–4.2)

## 2008-10-09 LAB — PTH INTACT
Calcium: 8.1 MG/DL — ABNORMAL LOW (ref 8.4–10.4)
PTH, Intact: 134.3 pg/mL — ABNORMAL HIGH (ref 14.0–72.0)

## 2008-10-09 LAB — PROGESTERONE: Progesterone: 0.45 ng/mL

## 2008-10-09 LAB — C REACTIVE PROTEIN, QT: C-Reactive protein: <0.2 MG/DL (ref 0.0–0.9)

## 2008-10-09 LAB — ESTRADIOL: Estradiol: 30.59 pg/mL

## 2008-10-09 LAB — IRON: Iron: 93 ug/dL (ref 35–150)

## 2008-10-09 LAB — LD: LD: 128 U/L (ref 110–210)

## 2008-10-09 LAB — PROLACTIN: Prolactin: 7.6 ng/mL

## 2008-10-09 MED ADMIN — enoxaparin (LOVENOX) injection 40 mg: SUBCUTANEOUS | @ 03:00:00 | NDC 00075062040

## 2008-10-09 MED ADMIN — celecoxib (CELEBREX) capsule 200 mg: ORAL | @ 22:00:00 | NDC 00025152534

## 2008-10-09 MED ADMIN — nuclear medicine isotope: @ 12:00:00 | NDC 88888888806

## 2008-10-09 MED ADMIN — tramadol (ULTRAM) tablet 50 mg: ORAL | @ 19:00:00 | NDC 51079099101

## 2008-10-09 MED ADMIN — atenolol (TENORMIN) tablet 50 mg: ORAL | @ 13:00:00 | NDC 62584046711

## 2008-10-09 MED ADMIN — pantoprazole (PROTONIX) injection 40 mg: INTRAVENOUS | @ 03:00:00 | NDC 00008092351

## 2008-10-09 MED ADMIN — cholecalciferol (vitamin d3) (VITAMIN D3) tablet 2,000 Units: ORAL | @ 13:00:00 | NDC 53191040901

## 2008-10-09 MED ADMIN — tramadol (ULTRAM) tablet 50 mg: ORAL | @ 03:00:00 | NDC 51079099101

## 2008-10-09 MED ADMIN — dextrose 5% - 0.45% NaCl with KCl 30 mEq/L infusion: INTRAVENOUS | @ 13:00:00 | NDC 00409790309

## 2008-10-09 MED ADMIN — celecoxib (CELEBREX) capsule 200 mg: ORAL | @ 13:00:00 | NDC 00025152534

## 2008-10-09 MED ADMIN — fluoxetine (PROZAC) capsule 20 mg: ORAL | @ 13:00:00 | NDC 51079097101

## 2008-10-09 MED ADMIN — pantoprazole (PROTONIX) injection 40 mg: INTRAVENOUS | @ 13:00:00 | NDC 00008092351

## 2008-10-09 MED ADMIN — lorazepam (ATIVAN) tablet 0.5 mg: ORAL | @ 03:00:00 | NDC 63739015410

## 2008-10-09 MED FILL — CELEBREX 200 MG CAPSULE: 200 mg | ORAL | Qty: 1

## 2008-10-09 MED FILL — D5-1/2 NS & POTASSIUM CHLORIDE 30 MEQ/L IV: 30 mEq/L | INTRAVENOUS | Qty: 1000

## 2008-10-09 MED FILL — FLUOXETINE 20 MG CAP: 20 mg | ORAL | Qty: 1

## 2008-10-09 MED FILL — LORAZEPAM 0.5 MG TAB: 0.5 mg | ORAL | Qty: 1

## 2008-10-09 MED FILL — LOVENOX 40 MG/0.4 ML SUBCUTANEOUS SYRINGE: 40 mg/0.4 mL | SUBCUTANEOUS | Qty: 1

## 2008-10-09 MED FILL — PROTONIX 40 MG INTRAVENOUS SOLUTION: 40 mg | INTRAVENOUS | Qty: 40

## 2008-10-09 MED FILL — CHOLECALCIFEROL (VITAMIN D3) 1,000 UNIT (25 MCG) TAB: ORAL | Qty: 2

## 2008-10-09 MED FILL — TRAMADOL 50 MG TAB: 50 mg | ORAL | Qty: 1

## 2008-10-09 MED FILL — ZOLPIDEM 10 MG TAB: 10 mg | ORAL | Qty: 1

## 2008-10-09 MED FILL — ATENOLOL 50 MG TAB: 50 mg | ORAL | Qty: 1

## 2008-10-09 NOTE — Progress Notes (Signed)
[facility]    Subjective:     Admission Date: 10/08/2008     Complaint:  Pt admitted yesterday with abrupt onset of anxiety, weakness, dyspnea, and cognitive change with near syncope.  Ct report to r/o PE not available to RN or myself late last night, nor this morning on Connect CC[facility]. Connect Care also shows H and P deficient but this was dictated via phone line yesterday.  Made nursing supervisor aware.  Spoke with pt today re meds annd Psych eval. To review serotonergic agent.  No apparent or reported change in dosing. Last dose of tramadol yesterday was early AM with breakfast and diaphoretic episode occurred early afternoon.  Pt complains of generalized arthralgias in addition to the above.    Current facility-administered medications   Medication Dose Route Frequency   ??? influenza vaccine tr-s 09 (PF) injection 0.5 mL  0.5 mL IntraMUSCular PRIOR TO DISCHARGE   ??? pantoprazole (PROTONIX) injection 40 mg  40 mg IntraVENous Q12H   ??? fluoxetine (PROZAC) capsule 20 mg  20 mg Oral BID   ??? atenolol (TENORMIN) tablet 50 mg  50 mg Oral DAILY   ??? dextrose 5% - 0.45% NaCl with KCl 30 mEq/L infusion    IntraVENous CONTINUOUS   ??? lorazepam (ATIVAN) injection 0.5 mg  0.5 mg IntraVENous Q6H PRN   ??? lorazepam (ATIVAN) tablet 0.5 mg  0.5 mg Oral Q6H PRN   ??? cholecalciferol (vitamin d3) (VITAMIN D3) tablet 2,000 Units  2,000 Units Oral DAILY   ??? ioversol (OPTIRAY) 350 mg/mL contrast solution 70 mL  70 mL IntraVENous RAD ONCE   ??? celecoxib (CELEBREX) capsule 200 mg  200 mg Oral BID   ??? tramadol (ULTRAM) tablet 50 mg  50 mg Oral Q4H PRN   ??? tramadol (ULTRAM) tablet 100 mg  100 mg Oral Q4H PRN   ??? zolpidem (AMBIEN) tablet 10 mg  10 mg Oral QHS PRN   ??? hydrOXYzine (VISTARIL) capsule 25 mg  25 mg Oral Q6H PRN   ??? enoxaparin (LOVENOX) injection 40 mg  40 mg SubCUTAneous Q12H   ??? DISCONTD: dextrose 5 % - 0.45% NaCl 1,000 mL with potassium chloride 30 mEq infusion    IntraVENous CONTINUOUS    ??? DISCONTD: zolpidem (AMBIEN) tablet 5 mg  5 mg Oral QHS PRN         Review of Systems:  Pertinent items are noted in HPI.    Objective:     BP 138/56   Pulse 56   Temp 97.6 ??F (36.4 ??C)   Resp 20   Ht 5\' 5"  (1.651 m)   Wt 163 lb 1.6 oz (73.982 kg)   SpO2 100%    Intake and Output:   In: 720 (225 P.O. 495 I.V.)  Out: 1250 (1250 Urine)         Chest tube IN:    Chest tube OUT    NG Tube IN:    NG Tube OUT:    Urine void OUT: Urine Voided: 150 ml (10/09/08 0357)  Urine cath OUT:    IV IN:  I.V.: 495 mL (10/09/08 0300)  TPN IN:    Feeding tube IN:      Physical Exam:     Data Review:     Recent Results (from the past 24 hour(s))   TROPONIN I    Collection Time    10/08/08  1:00 PM   Component Value Range   ??? Troponin-I, Qt. <0.04 (*) 0.04 - 0.05 (NG/ML)  T4, FREE    Collection Time    10/08/08  1:00 PM   Component Value Range   ??? T4, Free 0.9  0.9 - 1.76 (NG/DL)   CBC WITH AUTOMATED DIFF    Collection Time    10/08/08  1:00 PM   Component Value Range   ??? WBC 6.1  4.0 - 10.5 (K/uL)   ??? RBC 3.89  3.86 - 5.18 (M/uL)   ??? HGB 11.9  11.7 - 15.0 (g/dL)   ??? HCT 37.3 (*) 37.6 - 48.3 (%)   ??? MCV 95.9  79.6 - 97.8 (FL)   ??? MCH 30.6  26.1 - 32.9 (PG)   ??? MCHC 31.9  31.4 - 35.0 (g/dL)   ??? RDW 13.6  11.9 - 14.6 (%)   ??? PLATELET 230  140 - 440 (K/uL)   ??? MPV 11.2  10.8 - 14.1 (FL)   ??? DF AUTOMATED     ??? NEUTROPHILS 78  43 - 78 (%)   ??? LYMPHOCYTES 14  13 - 44 (%)   ??? MONOCYTES 7  4.0 - 12.0 (%)   ??? EOSINOPHILS 1  0.5 - 7.8 (%)   ??? BASOPHILS 0  0.0 - 2.0 (%)   ??? ABSOLUTE NEUTS 4.8  1.7 - 8.2 (K/UL)   ??? ABSOLUTE LYMPHS 0.9  0.5 - 4.6 (K/UL)   ??? ABSOLUTE MONOS 0.4  0.1 - 1.3 (K/UL)   ??? ABSOLUTE EOSINS 0.0  0.0 - 0.8 (K/UL)   ??? ABSOLUTE BASOS 0.0  0.0 - 0.2 (K/UL)   ??? IMM. GRANS. 0.0  0.0 - 2.0 (%)   ??? ABS. IMM. GRANS. 0.0  0.0 - 2.0 (K/UL)   METABOLIC PANEL, BASIC    Collection Time    10/08/08  1:00 PM   Component Value Range   ??? Sodium 136  136 - 145 (MMOL/L)   ??? Potassium 4.8  3.5 - 5.1 (MMOL/L)   ??? Chloride 103  98 - 107 (MMOL/L)    ??? CO2 27  21 - 32 (MMOL/L)   ??? Anion gap 6 (*) 7 - 16 (mmol/L)   ??? Glucose 97  82 - 115 (MG/DL)   ??? BUN 19  8 - 23 (MG/DL)   ??? Creatinine 1.0  0.6 - 1.0 (MG/DL)   ??? GFR est AA >60  >60 (ml/min/1.24m2)   ??? GFR est non-AA 57 (*) >60 (ml/min/1.63m2)   ??? Calcium 8.8  8.4 - 10.4 (MG/DL)         Images: pending      Hemodynamics:       PAP:             Wedge:         CVP:             CO:                CI:                  SVR:             PVR:          Oxygen Therapy:  Oxygen Therapy  Pulse (Heart Rate): 56  (10/09/08 0354)  O2 Sat (%): 100 % (10/09/08 0354)  O2 Device: Nasal cannula (10/09/08 0354)    Ventilator:         Assessment:     Patient Active Hospital Problem List:   * No active hospital problems. *  Plan:     Check labs:  ESR    Check cultures:  Urine    Check radiology:  CT chest with contrast  Therapeutic: Continue current  oxygen  intravenous fluids  medications  sedatives     Consult:  psychiatry    Activity: OOB in Chair    Disposition and Family: Unchanged.           Await study results and resolve problems with reporting , Connect Care, and dictation system.  Contact radiologist when available to retreive urgent testing to r/o PE.  Psych consult.  Still high anxiety but no overt physical signs at bedrest with O2 and supportive care.  Evaluate arthralgias.

## 2008-10-09 NOTE — Progress Notes (Signed)
Problem: Interdisciplinary Rounds  Goal: Interdisciplinary Rounds  Interdisciplinary team rounds were held 10/09/2008 with the following team members:Care Management, Nursing, Nutrition and Pharmacy and the patient.    Plan of care discussed. See clinical pathway and/or care plan for interventions and desired outcomes.  Weakness, Dsypnea, Syncope  Anxiety  Psych consult r/t anxiety  o2 an 2l   CT of chest to r/o P/E; hiatal hernia  Bone scan r/t leg pain

## 2008-10-09 NOTE — Progress Notes (Signed)
Psychiatry  Consult    Subjective:     Date of Evaluation:  10/09/2008    Reason for Referral:  Natalie Moon was referred to the examiners from Dr Shirlee Limerick for depresion.    History of Presenting Problem: 71 ys old widow female,lives alone.Pt has a long history of anxiety and depression,she has been treated with multiple medications.Sh state that during the last 6 months it has worsened,pt has lots of difficulties with daughter who pt states that wants her placed in a nursing home.Pt has been feeling tired tremulous,no energy she feels unwanted unloved and despondent.      Past Medical History   Diagnosis Date   ??? Hypertension    ??? Psychiatric disorder      anxiety and depression   ??? COPD    ??? Arrhythmia         No family history on file.   History   Substance Use Topics   ??? Tobacco Use: Never   ??? Alcohol Use: No       Past Surgical History   Procedure Date   ??? Hx hysterectomy    ??? Abdomen surgery proc unlisted      colon resection   ??? Hx cholecystectomy    ??? Hx appendectomy    ??? Hx tonsillectomy         Prior to Admission medications    Medication Sig Start Date End Date Taking? Authorizing Provider   celecoxib (CELEBREX) 200 mg capsule Take 200 mg by mouth daily. 10/08/08  Yes Historical Provider   clonazepam (KLONOPIN) 0.5 mg tablet Take 0.5 mg by mouth two (2) times a day. 10/08/08  Yes Historical Provider   atenolol (TENORMIN) 50 mg tablet take 50 mg by mouth daily.    Yes Historical Provider   AMBIEN 10 mg tablet Take 5 mg by mouth nightly as needed.   Yes Historical Provider   tramadol (ULTRAM) 50 mg tablet Take 50 mg by mouth every six (6) hours as needed. 10/08/08  Yes Historical Provider   ZETIA 10 mg tablet take 10 mg by mouth daily.    Yes Historical Provider   NEXIUM 40 mg capsule take 40 mg by mouth daily.    Yes Historical Provider   PROZAC 20 mg capsule take 20 mg by mouth two (2) times a day.    Yes Historical Provider       No Known Allergies       Objective:      Patient Vitals in the past 8 hrs:   BP Temp Pulse Resp SpO2   10/09/08 1700 150/70 mmHg 98 ??F (36.7 ??C) 58  20  96 %   10/09/08 1300 157/71 mmHg 97.7 ??F (36.5 ??C) 60  20  94 %         Mental Status exam: WNL except for    Sensorium  oriented to time, place and person   Orientation person, place, time/date and situation   Relations cooperative   Eye Contact appropriate   Appearance:  age appropriate   Motor Behavior:  hypoactive   Speech:  hypoverbal   Vocabulary average   Thought Process: tangential   Thought Content free of delusions, free of hallucinations and preoccupations   Suicidal ideations none   Homicidal ideations none   Mood:  anxious, depressed, labile and sad   Affect:  anxious, depressed and irritable   Memory recent  adequate   Memory remote:  adequate   Concentration:  adequate  Abstraction:  abstract   Insight:  fair   Reliability good   Judgment:  fair       Clinical Interview: Pt is depressed despondant,she feels tired no energy.Pt is however coherent and relevant her thinking is clear.Can not sleep,aprehensivePt is not suicidal.      Impression: Major depression recurrent.     Patient Active Hospital Problem List:   * No active hospital problems. *         Plan:Wellbutrin and celexa.     Recommendations for Treatment/Conditions:  Psychiatric treatment recommended while in hospital    Referral To:    Ofiice Dr Kateri Plummer.    Competency Statement:   At the current time, the patient is competent to make informed consent regarding their current medical care and discharge planning and/or financial decisions.

## 2008-10-09 NOTE — Progress Notes (Signed)
Clinical sent for review with EHR, Dr. Judie Petit. Natalie Moon, patient does not meet inpatient status .  After discussion between Dr. Francoise Ceo and Dr Virgina Norfolk status changed to Observation, Condition Code 44.  Order written and note placed in CNE.  Milford Cage RN

## 2008-10-10 LAB — ANA BY MULTIPLEX FLOW IA, QL
ANA, Direct: NOT DETECTED
ANA: NOT DETECTED

## 2008-10-10 LAB — LYME AB, IGG & IGM BY WB
Lyme Ab, IgG, W. blot: NEGATIVE
Lyme Ab, IgM, W. blot: NEGATIVE

## 2008-10-10 LAB — ALDOLASE: Aldolase: 3.8 U/L (ref 1.5–8.1)

## 2008-10-10 MED ADMIN — buPROPion SR (WELLBUTRIN SR) tablet 100 mg: ORAL | @ 01:00:00 | NDC 00591354060

## 2008-10-10 MED ADMIN — celecoxib (CELEBREX) capsule 200 mg: ORAL | @ 13:00:00 | NDC 00025152534

## 2008-10-10 MED ADMIN — pantoprazole (PROTONIX) injection 40 mg: INTRAVENOUS | @ 01:00:00 | NDC 00008092351

## 2008-10-10 MED ADMIN — citalopram (CELEXA) tablet 10 mg: ORAL | @ 13:00:00 | NDC 00093474119

## 2008-10-10 MED ADMIN — dextrose 5% - 0.45% NaCl with KCl 30 mEq/L infusion: INTRAVENOUS | @ 13:00:00 | NDC 00409790309

## 2008-10-10 MED ADMIN — buPROPion SR (WELLBUTRIN SR) tablet 100 mg: ORAL | @ 23:00:00 | NDC 00591354060

## 2008-10-10 MED ADMIN — celecoxib (CELEBREX) capsule 200 mg: ORAL | @ 23:00:00 | NDC 00025152534

## 2008-10-10 MED ADMIN — enoxaparin (LOVENOX) injection 40 mg: SUBCUTANEOUS | @ 01:00:00 | NDC 00075062040

## 2008-10-10 MED ADMIN — pantoprazole (PROTONIX) injection 40 mg: INTRAVENOUS | @ 13:00:00 | NDC 00008092351

## 2008-10-10 MED ADMIN — buPROPion SR (WELLBUTRIN SR) tablet 100 mg: ORAL | @ 13:00:00 | NDC 00591354060

## 2008-10-10 MED ADMIN — cholecalciferol (vitamin d3) (VITAMIN D3) tablet 2,000 Units: ORAL | @ 13:00:00 | NDC 53191040901

## 2008-10-10 MED ADMIN — atenolol (TENORMIN) tablet 50 mg: ORAL | @ 13:00:00 | NDC 62584046711

## 2008-10-10 MED ADMIN — dextrose 5% - 0.45% NaCl with KCl 30 mEq/L infusion: INTRAVENOUS | @ 01:00:00 | NDC 00409790309

## 2008-10-10 MED FILL — D5-1/2 NS & POTASSIUM CHLORIDE 30 MEQ/L IV: 30 mEq/L | INTRAVENOUS | Qty: 1000

## 2008-10-10 MED FILL — CITALOPRAM 20 MG TAB: 20 mg | ORAL | Qty: 1

## 2008-10-10 MED FILL — PROTONIX 40 MG INTRAVENOUS SOLUTION: 40 mg | INTRAVENOUS | Qty: 40

## 2008-10-10 MED FILL — ATENOLOL 50 MG TAB: 50 mg | ORAL | Qty: 1

## 2008-10-10 MED FILL — CHOLECALCIFEROL (VITAMIN D3) 1,000 UNIT (25 MCG) TAB: ORAL | Qty: 2

## 2008-10-10 MED FILL — BUPROPION SR 100 MG TAB: 100 mg | ORAL | Qty: 1

## 2008-10-10 MED FILL — LOVENOX 40 MG/0.4 ML SUBCUTANEOUS SYRINGE: 40 mg/0.4 mL | SUBCUTANEOUS | Qty: 1

## 2008-10-10 MED FILL — CELEBREX 200 MG CAPSULE: 200 mg | ORAL | Qty: 1

## 2008-10-10 NOTE — Progress Notes (Signed)
[place] Pt. Last Name: [place]  [facility] Pt. First Name: [address] MR#: [id number] / Admit#: [id number]   [place], [place] [address] DOB: 1929-07-25 / Age: 75  Attn.: Karn Cassis  Location: 95 - [id number]        Case Management - Progress Note  Initial Open Date: 10/10/2008   Case Manager: Milford Cage, RN    Initial Open Date: 10/10/2008  Social Worker: Harrold Donath LMSW    Expected Date of Discharge:   Transferred From:   ECF Bed Held Until:   Bed Held By:     Power of Attorney:   POA/Guardian/Conservator Capacity:   Primary Caregiver: Everlene Other [phone]  Living Arrangements: Own Home    Source of Income:   Payee:   Psychosocial History: Behavioral Health History  Cultural/Religious/Language Issues:   Education Level:   ADLS/Current Living Arrangements Issues: Normally manages most ADL's Lives   alone     Past Providers: none reported    Will patient perform self care at discharge? Y    Anticipated Discharge Disposition Goal: Return to admission address    Assessment/Plan:     10/10/2008 12:37P LMSW met with pt yesterday afternoon. Provided pt with   handout about her observation staus for her admission. Pt lives alone.   Normally manages ADL's with minimal assistance per her report. Reported hx of   depression/anxiety that is treated with oral medication. Pt also seen   yesterday by psychiatrist. Reviewed this consult this am and noted his   recommendations/changes in her medication treatment regarding her   depression/anxiety. Pt plan is for D/C home. Will follow and assist with any   supportive referrals as orders/recommendations are recieved form MD or   requested by pt. Harrold Donath, LMSW         Resources at Discharge:           Service Providers at Discharge:

## 2008-10-10 NOTE — Discharge Summary (Signed)
[facility]   [address]   [place], [place]. [address]   [phone]     DISCHARGE SUMMARY    NAME: Natalie Moon, Natalie Moon MR: [id number]  LOC: [id number] SEX: F ACCT: [id number]  DOB: April 04, 1929 AGE: 75 PT: T  ADMIT: 10/08/2008 DSCH: MSV: MED      HISTORY: The patient is a presently anxious Caucasian woman with  multiple medical problems who has been having abrupt paroxysmal spells  causing sudden weakness, anxiety, shortness of breath, cognitive changes  and profound diaphoresis followed by near fainting. These episodes nearly  always include this constellation of symptoms. The onset of symptoms  appear unprovoked, although she does have an overriding general anxiety  and has been diagnosed as having depression with anxiety. During  hospitalization she was evaluated by Psychiatrist Stanford Breed who changed  her medications from Prozac 20 mg twice a day to Wellbutrin SR 100 mg  twice a day along with Celexa. Of note, she also takes some Ultram, and  we discussed precautions with this medication that she apparently has  tolerated and has given her a great deal of relief with minimal  symptomatology for her arthralgias and myalgias that she deals with on a  daily basis. A bone scan during hospitalization showed degenerative  changes of the knees bilaterally, severe degenerative changes of the mid  cervical spine. While she complained of some right hip status post fall,  there was no evidence of fracture or significant degenerative change or  osteolytic or blastic change of the pelvis or hip.    Studies were reviewed relative to the last 30 days via [facility], and  it was noted that the patient had had surgery for hiatal hernia with less  than optimal results as there was persistent small hiatal hernia with  esophageal spasm by upper GI, and this was also noted on her CT scan of  the chest that was performed to rule out pulmonary embolism during  admission.     While she complained of no chest pain, it was apparent that she was short  of breath and diaphoretic. Her symptoms were not precipitated by  exertional activity, however, her troponin level was mildly elevated.  There were no electrocardiographic changes with diagnostic of MI. No  arrhythmias. She has been suspect to have an arrhythmia in the past and  has had event monitoring. She has been placed on Tenormin 50 mg daily yet  continues to have these paroxysmal spells.    One of these spells was witnessed while she was observed for over an hour  in the office prior to admission. She had profound cognitive changes with  slowing of her cognition, tachypnea, and may have had her presyncope due  to hyperventilation. During this episode, she was noted to have a  relatively normal electrocardiogram with no arrhythmia or ST/T changes  diagnostic of ischemia. Her respiratory rate was 28. There was no wheeze  auscultated on exam. She has been diagnosed as having asthma, and has  been on an Advair inhaler on a regular basis. She has never smoked. She  has not had any recent true allergies, although she has been suspect as  an allergy and asthma in the past. She denies any irritation of the  inhaler to her mouth as she has periodic hoarseness. She denies any  dyspeptic symptoms. She denies any post nasal drainage or nasal drainage.      By lab, she was noted to have an elevated PTH  of 134.3 and a low calcium  level of 8.1. Because of her recurrent hoarseness and abnormal  parathyroid findings, I discussed with the patient and family the  possibility of having an ENT evaluation in the near future.    On CT of the chest, she was found to have coronary calcifications. Again,  her troponin was noted to be positive. Aspirin was added to her regimen  upon discharge as Lovenox discontinued. The CT also showed some reticular  nodular changes and right upper lung field scarring. No one recalls a   history of pneumonia. We will place an intradermal PPD in the office upon  follow up.    Recently in the office, she was noted to have a severe vitamin D  deficiency, mild anemia, and a positive D-Dimer. Again, a CT of the chest  to rule out pulmonary embolism was negative. She had no lower extremity  findings to suggest deep venous thrombosis.    During hospitalization, Dr. Allene Dillon recommended that the patient's status  be observation and this was ordered per her prompting and expertise.    DISCHARGE MEDICATIONS  1. Tenormin 50 mg p.o. daily.  2. Ambien 10 mg 1/2 to 1 full tablet at bedtime.  3. Wellbutrin SR 100 p.o. twice a day.  4. Celebrex 100 p.o. twice a day.  5. Vitamin D3 2000 units daily with food.  6. Celexa 10 mg p.o. q.a.m. with food.  7. Nexium 40 mg daily.  8. Enteric coated baby aspirin 3 times weekly on Monday, Wednesday and  Friday.  9. Tramadol 50 mg 1-2 tablets every 6 hours p.r.n. severe pain. I have  asked them to pay particular attention to the patient's response in  regards to any spells that may occur after the ingestion of Tramadol as  there is some suspicion, yet unlikely, that Tramadol may be precipitating  symptoms. The patient believes she has had these spells prior to the  Tramadol use.    Again, as mentioned above, a bone scan was performed, a CT of the chest.  Also on record, is an upper GI/barium swallow that showed failed hiatal  hernia surgery with distal esophageal spasm.    The lab data showed that she did have markedly low progesterone and  estrogen levels as well as a low prolactin level although this level was  not absent. An a.m. cortisol level was normal. Her thyroid function tests  were within normal limits. Again, some lab data was recently performed in  the office prior to admission, and all of this except the elevated  D-Dimer as mentioned was considered noncontributory.    The patient is being discharged with family. The daughter is taking her   to live with them for a week or so. Precautions discussed. Access to care  discussed. She was given an influenza vaccination during her stay. I also  discussed further cardiac testing including repeat event monitoring and  coronary artery assessment in the near future as it appears her symptoms  seem to continue to go unexplained.    Psychiatric evaluation appeared to be somewhat vague. She is suspect to  have depression and anxiety. Panic disorder was not mentioned.    IMPRESSION  1. Suspected subendocardial or myocardial infarction with elevated  troponin level and coronary calcifications noted by CT of the chest.  Aspirin added. It is listed that she has an allergy to aspirin, but she  has been told to avoid aspirin and nonsteroidal agents relative to her  gastrointestinal  problems. She will continue to take her proton pump  inhibitor, which I think will afford her some protection, and the aspirin  will afford her some stroke protection relative to coronary artery  disease and a paroxysmal rhythm should this be her case.  2. Elevated parathyroid hormone level with low serum calcium. We will  repeat this study in the office before pursuing the possibility of one of  her parathyroid being dysfunctional.  3. I believe she carries the diagnosis of osteoporosis but is not on any  oral therapy for this.  4. Diagnosis of asthma for which she is taking an Advair inhaler daily  and apparently tolerating this well without tremulousness or anxiety  relative to her inhaler. She denies Advair inhaler causing her  hoarseness. ENT evaluation with [facility] to be scheduled for  visualization of vocal cords and possibly parathyroid intervention.  5. Hormone deficiency with low estrogen, progesterone and prolactin.  6. Right upper lung field scarring incidentally noted by CT of the chest.  Consider intradermal PPD as an outpatient.  7. Esophageal spasm and hiatal hernia with failed repair of hiatal  hernia.   8. History of mild anemia and gastric biopsy in the past by Dr. Dareen Piano  during hiatal hernia surgery was unremarkable.  9. Degenerative arthritis of the bilateral knees and ankle.  10. Severe degenerative changes of the mid cervical spine, presently  asymptomatic. However, consideration for MRI with premedication might be  considered relative to diaphragmatic innervation.  11. Severe vitamin deficiency.  12. Sleep difficulty for which she has tolerated Ambien well in the  past.                Karn Cassis, DO     This is an unverified document unless signed by physician.    TID: [id number] DT: 10/10/2008 9:33 P  JOB: [id number] DOC#: [id number] DD: 10/10/2008    cc: Karn Cassis, DO

## 2008-10-10 NOTE — Procedures (Signed)
[facility]                            [address]                            [place], [place] [address]                                [phone]                                 PROCEDURE NOTE    NAME:  Natalie Moon, Natalie Moon                              MR:  [id number]  Mason  LOC:  [id number]             SEX:  F                 ACCT:  [id number]  DOB:  02-Jun-1929            AGE:  75                PT:  T  ADMIT:  10/08/2008          DSCH:  10/10/2008       MSV:  MED      REQUESTING PHYSICIAN:  Dr. Venia Carbon.    FINDINGS   Spirometry shows decreased FVC at 70% predicted. FEV1 is decreased at 79% predicted. FEV1/FVC ratio is normal. Spirogram appears normal.    Lung volume shows a normal TLC at 92% predicted. RV is normal at 118%  predicted. FRC is normal at 96% predicted.     Diffusion is normal.    Impression:  Normal  Spirometry,lung olumes and diffusion are normal as well                  Anitra Lauth, MD                This is an unverified document unless signed by physician.    TID:  [id number]              DIC ID:  [id number]          DT:  10/14/2008  9:18 A  JOB:  [id number]        DOC#:  [id number]           DD:  10/13/2008    cc:   Anitra Lauth, MD        Karn Cassis, DO

## 2008-10-11 LAB — RICKETTSIAL FEVER ABS
RMSF, IgG: <1:64 {titer}
RMSF, IgM: <1:64 {titer}

## 2008-10-11 LAB — TESTOSTERONE, FREE, FEMALE/CHILD: Testosterone,Free,Female/Child: 2.4 pg/mL (ref 0.6–3.8)

## 2008-10-11 LAB — CYCLIC CITRUL PEPTIDE AB, IGG: Cyclic Citrullinated Peptide Ab: 2 units (ref <=19)

## 2008-10-14 NOTE — Procedures (Signed)
[facility]                            [address]                            [place] [address]                                [phone]                                 PROCEDURE NOTE    NAME:  Natalie Moon, Natalie Moon                              MR:  [id number]  Mason  LOC:  [id number]  [id number]             SEX:  F                 ACCT:  [id number]  DOB:  03/25/1929            AGE:  75                PT:  T  ADMIT:  10/08/2008          DSCH:  10/10/2008       MSV:  MED      REQUESTING PHYSICIAN:  Dr. Philips.    FINDINGS   Spirometry shows decreased FVC at 70% predicted. FEV1 is decreased at 79% predicted. FEV1/FVC ratio is normal. Spirogram appears normal.    Lung volume shows a normal TLC at 92% predicted. RV is normal at 118%  predicted. FRC is normal at 96% predicted.     Diffusion is normal.    Impression:  Normal  Spirometry,lung olumes and diffusion are normal as well                  Syris Brookens, MD                This is an unverified document unless signed by physician.    TID:  [id number]              DIC ID:  [id number]          DT:  10/14/2008  9:18 A  JOB:  [id number]        DOC#:  [id number]           DD:  10/13/2008    cc:   Stefani Baik, MD        Ronald S Phillips, DO

## 2008-10-15 LAB — CORTISOL, AM: Cortisol, a.m.: 8.3 ug/dL (ref 6–28)

## 2009-07-11 LAB — CBC WITH AUTOMATED DIFF
ABS. BASOPHILS: 0 10*3/uL (ref 0.0–0.2)
ABS. EOSINOPHILS: 0.1 10*3/uL (ref 0.0–0.8)
ABS. IMM. GRANS.: 0 10*3/uL (ref 0.0–2.0)
ABS. LYMPHOCYTES: 1.2 10*3/uL (ref 0.5–4.6)
ABS. MONOCYTES: 0.8 10*3/uL (ref 0.1–1.3)
ABS. NEUTROPHILS: 5.7 10*3/uL (ref 1.7–8.2)
BASOPHILS: 0 % (ref 0.0–2.0)
EOSINOPHILS: 1 % (ref 0.5–7.8)
HCT: 37.6 % (ref 35.8–46.3)
HGB: 12.2 g/dL (ref 11.7–15.4)
IMMATURE GRANULOCYTES: 0.1 % (ref 0.0–2.0)
LYMPHOCYTES: 15 % (ref 13–44)
MCH: 29.5 PG (ref 26.1–32.9)
MCHC: 32.4 g/dL (ref 31.4–35.0)
MCV: 91 FL (ref 79.6–97.8)
MONOCYTES: 10 % (ref 4.0–12.0)
MPV: 11 FL (ref 10.8–14.1)
NEUTROPHILS: 74 % (ref 43–78)
PLATELET: 203 10*3/uL (ref 150–450)
RBC: 4.13 M/uL (ref 4.05–5.25)
RDW: 14.6 % (ref 11.9–14.6)
WBC: 7.7 10*3/uL (ref 4.3–11.1)

## 2009-07-11 LAB — METABOLIC PANEL, COMPREHENSIVE
A-G Ratio: 0.8 — ABNORMAL LOW (ref 1.2–3.5)
ALT (SGPT): 47 U/L (ref 39–65)
AST (SGOT): 45 U/L — ABNORMAL HIGH (ref 15–37)
Albumin: 3.5 g/dL (ref 3.2–4.6)
Alk. phosphatase: 132 U/L (ref 50–136)
Anion gap: 9 mmol/L (ref 7–16)
BUN: 26 MG/DL — ABNORMAL HIGH (ref 8–23)
Bilirubin, total: 0.4 MG/DL (ref 0.2–1.1)
CO2: 25 MMOL/L (ref 23–32)
Calcium: 9 MG/DL (ref 8.3–10.4)
Chloride: 103 MMOL/L (ref 98–107)
Creatinine: 1.1 MG/DL (ref 0.6–1.5)
GFR est AA: >60 mL/min/{1.73_m2} (ref >60)
GFR est non-AA: 51 mL/min/{1.73_m2} — ABNORMAL LOW (ref >60)
Globulin: 4.4 g/dL — ABNORMAL HIGH (ref 2.3–3.5)
Glucose: 98 MG/DL (ref 65–100)
Potassium: 4.6 MMOL/L (ref 3.5–5.1)
Protein, total: 7.9 g/dL (ref 6.3–8.2)
Sodium: 137 MMOL/L (ref 136–145)

## 2009-07-11 LAB — POC CARDIAC MARKERS W BNP
BNP: 94 pg/mL (ref 0.0–100.0)
CK-MB: 1 ng/mL (ref 0.0–8.0)
Myoglobin: 103 ng/mL (ref 0–170)
Troponin-I: <0.05 ng/mL (ref 0.00–0.30)

## 2009-07-11 MED ORDER — HYDROCODONE-ACETAMINOPHEN 5 MG-500 MG TAB
5-500 mg | ORAL_TABLET | Freq: Four times a day (QID) | ORAL | Status: AC | PRN
Start: 2009-07-11 — End: 2009-07-18

## 2009-07-11 NOTE — ED Provider Notes (Addendum)
HPI Comments: 61 wf who lives alone had a bm this am and suddenly became dizzy, sweaty and started to pass out and twisted her right ankle at that time. She denies chest pain, sob coughing or dysuria.    Patient is a 76 y.o. female presenting with dizziness.   Dizziness   Associated symptoms include dizziness.        Past Medical History   Diagnosis Date   ??? Hypertension    ??? Psychiatric disorder      anxiety and depression   ??? COPD    ??? Arrhythmia           Past Surgical History   Procedure Date   ??? Hx tonsillectomy    ??? Hx hysterectomy    ??? Abdomen surgery proc unlisted      colon resection   ??? Hx cholecystectomy    ??? Hx appendectomy            No family history on file.     History   Social History   ??? Marital Status: Widowed     Spouse Name: N/A     Number of Children: N/A   ??? Years of Education: N/A   Occupational History   ??? Not on file.   Social History Main Topics   ??? Smoking status: Never Smoker    ??? Smokeless tobacco: Not on file   ??? Alcohol Use: No   ??? Drug Use: No   ??? Sexually Active:    Other Topics Concern   ??? Not on file   Social History Narrative   ??? No narrative on file                    ALLERGIES: Review of patient's allergies indicates no known allergies.      Review of Systems   Neurological: Positive for dizziness.   All other systems reviewed and are negative.        Filed Vitals:    07/11/09 0940 07/11/09 0947   BP: 132/60 121/57   Pulse: 52 62   Temp: 97.3 ??F (36.3 ??C)    Resp: 22    Height: 5\' 5"  (1.651 m)    Weight: 165 lb (74.844 kg)    SpO2: 95%               Physical Exam   Nursing note and vitals reviewed.  Constitutional: She is oriented to person, place, and time. She appears well-developed and well-nourished.   HENT:   Head: Normocephalic and atraumatic.   Right Ear: External ear normal.   Left Ear: External ear normal.   Nose: Nose normal.   Mouth/Throat: Oropharynx is clear and moist.   Eyes: Conjunctivae and EOM are normal. Pupils are equal, round, and reactive to light.    Neck: Normal range of motion. Neck supple.   Cardiovascular: Normal rate, regular rhythm, normal heart sounds and intact distal pulses.    Pulmonary/Chest: Effort normal and breath sounds normal.   Abdominal: Soft.   Musculoskeletal: Normal range of motion. She exhibits tenderness. She exhibits no edema.        Feet:      Neurological: She is alert and oriented to person, place, and time. She has normal reflexes. She displays normal reflexes. No cranial nerve deficit. She exhibits normal muscle tone. Coordination normal.   Skin: Skin is warm and dry.        MDM    Splint, Short Leg   Date/Time:  07/11/2009 10:53 AM  Performed by: attending  Pre-procedure re-eval: Immediately prior to the procedure, the patient was reevaluated and found suitable for the planned procedure and any planned medications.  Location details: right leg and right foot  Splint type: short leg and ankle stirrup (post and stirrup)  Approach: posterior  Supplies used: Ortho-Glass  Post-procedure: The splinted body part was neurovascularly unchanged following the procedure.  Patient tolerance: Patient tolerated the procedure well with no immediate complications.  My total time at bedside, performing this procedure was 16-30 minutes.

## 2009-07-11 NOTE — ED Notes (Signed)
Pt discharged  to home. Pt teaching rt discharge and followup instructions and rx x 1 provided. Pt voiced understanding.

## 2009-07-11 NOTE — ED Notes (Signed)
Report given to Geanie Berlin, RN

## 2009-07-11 NOTE — ED Notes (Signed)
Distal fibula fracture, non displaced.

## 2009-07-11 NOTE — ED Notes (Signed)
Pt states she has a set of crutches and a walker at home.

## 2009-07-11 NOTE — ED Notes (Signed)
ua collected via I/O cath, pt then placed on bedpan, <10 ml urine returned in bedpan, pt positioned for comfort

## 2009-07-13 LAB — EKG, 12 LEAD, INITIAL
Atrial Rate: 52 {beats}/min
Calculated P Axis: 76 degrees
Calculated R Axis: 63 degrees
Calculated T Axis: 49 degrees
P-R Interval: 184 ms
Q-T Interval: 442 ms
QRS Duration: 82 ms
QTC Calculation (Bezet): 411 ms
Ventricular Rate: 52 {beats}/min

## 2010-04-14 LAB — CBC WITH AUTOMATED DIFF
ABS. BASOPHILS: 0 10*3/uL (ref 0.0–0.2)
ABS. EOSINOPHILS: 0.1 10*3/uL (ref 0.0–0.8)
ABS. LYMPHOCYTES: 1.2 10*3/uL (ref 0.5–4.6)
ABS. MONOCYTES: 0.5 10*3/uL (ref 0.1–1.3)
ABS. NEUTROPHILS: 3.1 10*3/uL (ref 1.7–8.2)
BASOPHILS: 0 % (ref 0.0–2.0)
EOSINOPHILS: 1 % (ref 0.5–7.8)
HCT: 35.2 % — ABNORMAL LOW (ref 35.8–46.3)
HGB: 11.6 g/dL — ABNORMAL LOW (ref 11.7–15.4)
LYMPHOCYTES: 25 % (ref 13–44)
MCH: 30.7 PG (ref 26.1–32.9)
MCHC: 33 g/dL (ref 31.4–35.0)
MCV: 93.1 FL (ref 79.6–97.8)
MONOCYTES: 10 % (ref 4.0–12.0)
MPV: 9.6 FL — ABNORMAL LOW (ref 10.8–14.1)
NEUTROPHILS: 64 % (ref 43–78)
PLATELET: 202 10*3/uL (ref 150–450)
RBC: 3.78 M/uL — ABNORMAL LOW (ref 4.05–5.25)
RDW: 13.9 % (ref 11.9–14.6)
WBC: 4.8 10*3/uL (ref 4.3–11.1)

## 2010-04-14 LAB — METABOLIC PANEL, BASIC
Anion gap: 4 mmol/L — ABNORMAL LOW (ref 7–16)
BUN: 16 MG/DL (ref 8–23)
CO2: 32 MMOL/L (ref 23–32)
Calcium: 8.8 MG/DL (ref 8.3–10.4)
Chloride: 103 MMOL/L (ref 98–107)
Creatinine: 1.1 MG/DL (ref 0.6–1.5)
GFR est AA: >60 mL/min/{1.73_m2} (ref >60)
GFR est non-AA: 51 mL/min/{1.73_m2} — ABNORMAL LOW (ref >60)
Glucose: 93 MG/DL (ref 65–100)
Potassium: 3.9 MMOL/L (ref 3.5–5.1)
Sodium: 139 MMOL/L (ref 136–145)

## 2010-04-14 NOTE — Progress Notes (Signed)
Pre-procedure instructions complete with patient for heart catheterization scheduled for 04-19-10 . Patient verbalizes understanding and all questions answered. Copy of instructions given to patient.    Medications reviewed.Instructed to bring all medications in labeled containers day of procedure.  Instructed to take routine medication am of procedure and aspirin 325mg  .

## 2010-04-19 LAB — METABOLIC PANEL, BASIC
Anion gap: 9 mmol/L (ref 7–16)
BUN: 15 MG/DL (ref 8–23)
CO2: 29 MMOL/L (ref 23–32)
Calcium: 9.3 MG/DL (ref 8.3–10.4)
Chloride: 103 MMOL/L (ref 98–107)
Creatinine: 1 MG/DL (ref 0.6–1.5)
GFR est AA: >60 mL/min/{1.73_m2} (ref >60)
GFR est non-AA: 57 mL/min/{1.73_m2} — ABNORMAL LOW (ref >60)
Glucose: 107 MG/DL — ABNORMAL HIGH (ref 65–100)
Potassium: 4 MMOL/L (ref 3.5–5.1)
Sodium: 141 MMOL/L (ref 136–145)

## 2010-04-19 MED ORDER — NITROGLYCERIN 0.2MG/ML SYRINGE
0.2 mg/mL | Freq: Once | INTRAMUSCULAR | Status: AC
Start: 2010-04-19 — End: 2010-04-19
  Administered 2010-04-19: 19:00:00 via INTRA_ARTERIAL

## 2010-04-19 MED ORDER — FENTANYL CITRATE (PF) 50 MCG/ML IJ SOLN
50 mcg/mL | INTRAMUSCULAR | Status: DC | PRN
Start: 2010-04-19 — End: 2010-04-19
  Administered 2010-04-19 (×3): via INTRAVENOUS

## 2010-04-19 MED ORDER — IODIXANOL 320 MG/ML IV SOLN
320 mg iodine/mL | Freq: Once | INTRAVENOUS | Status: DC
Start: 2010-04-19 — End: 2010-04-19

## 2010-04-19 MED ORDER — VERAPAMIL 2.5 MG/ML IV
2.5 mg/mL | Freq: Once | INTRAVENOUS | Status: AC
Start: 2010-04-19 — End: 2010-04-19
  Administered 2010-04-19: 19:00:00 via INTRA_ARTERIAL

## 2010-04-19 MED ORDER — MIDAZOLAM 1 MG/ML IJ SOLN
1 mg/mL | INTRAMUSCULAR | Status: DC | PRN
Start: 2010-04-19 — End: 2010-04-19

## 2010-04-19 MED ORDER — CLOPIDOGREL 300 MG TAB
300 mg | Freq: Once | ORAL | Status: AC
Start: 2010-04-19 — End: 2010-04-19
  Administered 2010-04-19: 16:00:00 via ORAL

## 2010-04-19 MED ORDER — HEPARIN (PORCINE) IN NS (PF) 2,000 UNIT/1,000 ML IV
2000 unit/1,000 mL | INTRAVENOUS | Status: DC
Start: 2010-04-19 — End: 2010-04-19
  Administered 2010-04-19: 18:00:00 via INTRAVENOUS

## 2010-04-19 MED ORDER — MIDAZOLAM 1 MG/ML IJ SOLN
1 mg/mL | INTRAMUSCULAR | Status: DC | PRN
Start: 2010-04-19 — End: 2010-04-19
  Administered 2010-04-19 (×3): via INTRAVENOUS

## 2010-04-19 MED ORDER — IOVERSOL 350 MG/ML IV SOLN
350 mg iodine/mL | Freq: Once | INTRAVENOUS | Status: AC
Start: 2010-04-19 — End: 2010-04-19
  Administered 2010-04-19: 20:00:00

## 2010-04-19 MED ORDER — SODIUM CHLORIDE 0.9 % IV
INTRAVENOUS | Status: DC
Start: 2010-04-19 — End: 2010-04-19

## 2010-04-19 MED ORDER — HEPARIN (PORCINE) 10,000 UNIT/ML IJ SOLN
10000 unit/mL | Freq: Once | INTRAMUSCULAR | Status: AC
Start: 2010-04-19 — End: 2010-04-19
  Administered 2010-04-19 (×2): via INTRA_ARTERIAL

## 2010-04-19 MED ORDER — DIAZEPAM 5 MG TAB
5 mg | Freq: Once | ORAL | Status: AC
Start: 2010-04-19 — End: 2010-04-19
  Administered 2010-04-19: 16:00:00 via ORAL

## 2010-04-19 MED ORDER — SODIUM CHLORIDE 0.9 % IV
INTRAVENOUS | Status: DC
Start: 2010-04-19 — End: 2010-04-19
  Administered 2010-04-19: 16:00:00 via INTRAVENOUS

## 2010-04-19 MED ADMIN — 0.9% sodium chloride infusion: INTRAVENOUS | @ 22:00:00 | NDC 00409798309

## 2010-04-19 NOTE — Progress Notes (Signed)
Pt admitted to room 2226, alert and oriented x4. Denies needs or complaints. Oriented to room, staff, call light, and bed. NS infusion continues at 75 ml/hr. Right wrist TR Band inflated. Will remove 2 ml and check for bleeding. Dual skin assessment completed with Standley Dakins Rn. Instructed to keep arm straight and still and to call for assistance.

## 2010-04-19 NOTE — Progress Notes (Signed)
Site oozing; 4 ml additional air inflated; oozing stopped; site without bleeding/hematoma

## 2010-04-19 NOTE — Progress Notes (Signed)
Transported to floor by Tawnya Crook and Malachy Mood.

## 2010-04-19 NOTE — Progress Notes (Signed)
TRANSFER - IN REPORT:    Verbal report received from Gabrielle Dare, RN on Natalie Moon  being received from [facility] for routine progression of care      Report consisted of patient???s Situation, Background, Assessment and   Recommendations(SBAR).     Information from the following report(s) SBAR, Kardex, Procedure Summary, Intake/Output, MAR, Accordion and Recent Results was reviewed with the receiving nurse.    Opportunity for questions and clarification was provided.      Assessment completed upon patient???s arrival to unit and care assumed.     Dual skin assessment complete with Chevis Pretty, RN. No redness or breakdown noted.

## 2010-04-19 NOTE — Progress Notes (Signed)
Attempt to remove 3 ml from tr band and pt immediately began to ooze from site. 1 ml replaced and oozing cesed will leave for now. Total of 4 ml removed so far.

## 2010-04-19 NOTE — Progress Notes (Signed)
TRANSFER - OUT REPORT:    Verbal report given to Standley Dakins RN on Natalie Moon  being transferred to stepdown unit room 2226 for routine progression of care       Report consisted of patient???s Situation, Background, Assessment and   Recommendations(SBAR).     Information from the following report(s) Procedure Summary, MAR and Recent Results was reviewed with the receiving nurse.    Opportunity for questions and clarification was provided.

## 2010-04-19 NOTE — Progress Notes (Signed)
TRANSFER - OUT REPORT:    Verbal report given to Angie, RN on Natalie Moon  being transferred to [facility] for routine progression of care       Report consisted of patient???s Situation, Background, Assessment and   Recommendations(SBAR).     Information from the following report(s) SBAR, Procedure Summary, MAR and Recent Results was reviewed with the receiving nurse.    Opportunity for questions and clarification was provided.      LHC/PCI Performed by Dr Daleen Snook  One drug eluting stent placed in the RCA  6 fr short sheath right radial  TR Band with 13 ml's  Versed 4 mg IV  Fentanyl 150 mcg IV  Heparin 4000 units IV  ACT last checked @ 1511 , result greater than 400

## 2010-04-19 NOTE — Procedures (Signed)
Brief Cardiac Catheterization Procedure Note    Patient: Natalie Moon Hearty MRN: [id number]  SSN: [id number]    Date of Birth: 12/27/1929  Age: 77 y.o.  Sex: female      Date of Procedure: 04/19/2010     Pre-procedure Diagnosis: Coronary Artery Disease    Post-procedure Diagnosis: Coronary Artery Disease    Procedure: Left Heart Catheterization with Percutaneous Coronary Intervention    Performed By: Pola Furno E Donjuan Robison, MD     Anesthesia: Moderate Sedation    Estimated Blood Loss: Less than 10 mL      Specimens: None    Findings: LV is normal. LMC minor disease  LAD with mid 40-50% diffuse disease. Circumflex minor disease. RCA ostial 80% with mid 40%.      3.5x15 Xience to ostial RCA   IVUS guidance good angio and ivus appearance.     Complications: None    Implants: None    Recommendations: ASA PLAVIX one year.     Signed By: Salinda Snedeker E Slaton Reaser, MD     April 19, 2010

## 2010-04-19 NOTE — Progress Notes (Signed)
Report received from Tiana Bottoms Cath Lab RN. Procedural findings communicated. Intra procedural  medication administration reviewed. Progression of care discussed.     Patient received into CPRU [place] 1 post sheath removal.     Access site without bleeding or swelling yes    Dressing dry and intact yes    Patient instructed to limit movement to right upper extremity    Routine post procedural vital signs and site assessment initiated yes

## 2010-04-19 NOTE — Progress Notes (Signed)
Patient received into lab for left heart cath. Patient was identified using name and date of birth. Pertinent information was reviewed including allergies, history, medications and lab work. Patient states that she has no questions concerning procedure. Signed consent is on chart as well current history and physical. IV access was checked for patency.

## 2010-04-19 NOTE — Procedures (Signed)
Brief Cardiac Catheterization Procedure Note    Patient: Natalie Moon MRN: [id number]  SSN: [id number]    Date of Birth: August 27, 1929  Age: 77 y.o.  Sex: female      Date of Procedure: 04/19/2010     Pre-procedure Diagnosis: Coronary Artery Disease    Post-procedure Diagnosis: Coronary Artery Disease    Procedure: Left Heart Catheterization with Percutaneous Coronary Intervention    Performed By: Seabron Spates, MD     Anesthesia: Moderate Sedation    Estimated Blood Loss: Less than 10 mL      Specimens: None    Findings: LV is normal. LMC minor disease  LAD with mid 40-50% diffuse disease. Circumflex minor disease. RCA ostial 80% with mid 40%.      3.5x15 Xience to ostial RCA   IVUS guidance good angio and ivus appearance.     Complications: None    Implants: None    Recommendations: ASA PLAVIX one year.     Signed By: Seabron Spates, MD     April 19, 2010

## 2010-04-20 DIAGNOSIS — R9439 Abnormal result of other cardiovascular function study: Secondary | ICD-10-CM | POA: Insufficient documentation

## 2010-04-20 DIAGNOSIS — Z955 Presence of coronary angioplasty implant and graft: Secondary | ICD-10-CM | POA: Insufficient documentation

## 2010-04-20 DIAGNOSIS — I10 Essential (primary) hypertension: Secondary | ICD-10-CM | POA: Insufficient documentation

## 2010-04-20 DIAGNOSIS — I2510 Atherosclerotic heart disease of native coronary artery without angina pectoris: Secondary | ICD-10-CM | POA: Insufficient documentation

## 2010-04-20 DIAGNOSIS — E785 Hyperlipidemia, unspecified: Secondary | ICD-10-CM | POA: Insufficient documentation

## 2010-04-20 LAB — METABOLIC PANEL, BASIC
Anion gap: 2 mmol/L — ABNORMAL LOW (ref 7–16)
BUN: 16 MG/DL (ref 8–23)
CO2: 29 MMOL/L (ref 23–32)
Calcium: 8.5 MG/DL (ref 8.3–10.4)
Chloride: 108 MMOL/L — ABNORMAL HIGH (ref 98–107)
Creatinine: 0.9 MG/DL (ref 0.6–1.5)
GFR est AA: >60 mL/min/{1.73_m2} (ref >60)
GFR est non-AA: >60 mL/min/{1.73_m2} (ref >60)
Glucose: 96 MG/DL (ref 65–100)
Potassium: 3.9 MMOL/L (ref 3.5–5.1)
Sodium: 139 MMOL/L (ref 136–145)

## 2010-04-20 LAB — CBC WITH AUTOMATED DIFF
ABS. BASOPHILS: 0 10*3/uL (ref 0.0–0.2)
ABS. EOSINOPHILS: 0.1 10*3/uL (ref 0.0–0.8)
ABS. IMM. GRANS.: 0 10*3/uL (ref 0.0–0.5)
ABS. LYMPHOCYTES: 1.3 10*3/uL (ref 0.5–4.6)
ABS. MONOCYTES: 0.4 10*3/uL (ref 0.1–1.3)
ABS. NEUTROPHILS: 2.2 10*3/uL (ref 1.7–8.2)
BASOPHILS: 0 % (ref 0.0–2.0)
EOSINOPHILS: 2 % (ref 0.5–7.8)
HCT: 32.9 % — ABNORMAL LOW (ref 35.8–46.3)
HGB: 10.5 g/dL — ABNORMAL LOW (ref 11.7–15.4)
IMMATURE GRANULOCYTES: 0.3 % (ref 0.0–5.0)
LYMPHOCYTES: 33 % (ref 13–44)
MCH: 29.7 PG (ref 26.1–32.9)
MCHC: 31.9 g/dL (ref 31.4–35.0)
MCV: 92.9 FL (ref 79.6–97.8)
MONOCYTES: 10 % (ref 4.0–12.0)
MPV: 10.5 FL — ABNORMAL LOW (ref 10.8–14.1)
NEUTROPHILS: 55 % (ref 43–78)
PLATELET: 188 10*3/uL (ref 150–450)
RBC: 3.54 M/uL — ABNORMAL LOW (ref 4.05–5.25)
RDW: 14.1 % (ref 11.9–14.6)
WBC: 4 10*3/uL — ABNORMAL LOW (ref 4.3–11.1)

## 2010-04-20 LAB — GLUCOSE, POC: Glucose (POC): 145 mg/dL — ABNORMAL HIGH (ref 65–100)

## 2010-04-20 MED ORDER — ASPIRIN 325 MG TAB
325 mg | ORAL_TABLET | Freq: Every day | ORAL | Status: DC
Start: 2010-04-20 — End: 2012-05-10

## 2010-04-20 MED ORDER — NITROGLYCERIN 0.4 MG SUBLINGUAL TAB
0.4 mg | ORAL_TABLET | SUBLINGUAL | Status: DC | PRN
Start: 2010-04-20 — End: 2012-09-07

## 2010-04-20 MED ORDER — CLOPIDOGREL 75 MG TAB
75 mg | ORAL_TABLET | Freq: Every day | ORAL | Status: DC
Start: 2010-04-20 — End: 2013-05-03

## 2010-04-20 MED ADMIN — pantoprazole (PROTONIX) tablet 40 mg: ORAL | @ 12:00:00 | NDC 00008060704

## 2010-04-20 MED ADMIN — HYDROcodone-acetaminophen (NORCO) 5-325 mg per tablet 2 Tab: ORAL | @ 02:00:00 | NDC 00406036562

## 2010-04-20 MED ADMIN — citalopram (CELEXA) tablet 10 mg: ORAL | @ 13:00:00 | NDC 65162005210

## 2010-04-20 MED ADMIN — morphine injection 2 mg: INTRAVENOUS | @ 09:00:00 | NDC 00409176230

## 2010-04-20 MED ADMIN — levothyroxine (SYNTHROID) tablet 50 mcg: ORAL | @ 12:00:00 | NDC 00074455211

## 2010-04-20 MED ADMIN — valsartan (DIOVAN) tablet 80 mg: ORAL | @ 13:00:00 | NDC 00078035861

## 2010-04-20 MED ADMIN — aspirin (ASPIRIN) tablet 325 mg: ORAL | @ 13:00:00 | NDC 66553000101

## 2010-04-20 MED ADMIN — potassium chloride (K-DUR, KLOR-CON) SR tablet 40 mEq: ORAL | @ 13:00:00 | NDC 00245005889

## 2010-04-20 MED ADMIN — clopidogrel (PLAVIX) tablet 75 mg: ORAL | @ 20:00:00 | NDC 82009002190

## 2010-04-20 MED ADMIN — nebivolol (BYSTOLIC) tablet 5 mg: ORAL | @ 13:00:00 | NDC 00456140511

## 2010-04-20 MED ADMIN — clopidogrel (PLAVIX) tablet 75 mg: ORAL | @ 13:00:00 | NDC 63653117103

## 2010-04-20 MED ADMIN — ezetimibe (ZETIA) tablet 10 mg: ORAL | @ 13:00:00 | NDC 82009002405

## 2010-04-20 NOTE — Progress Notes (Signed)
Pain relieved. Instructed to call for reoccurrance. Voiced understanding.

## 2010-04-20 NOTE — Progress Notes (Signed)
Discharge instructions given to patient and family. Education given on anticoagulants, incision care, and post cath plan of care. Informed of follow up appointments. Removed peripheral IV and cardiac monitor. Dressing to right radial cath site intact and clean. Opportunity given for questions. Patient discharged to home with no complaints and no acute distress.

## 2010-04-20 NOTE — Progress Notes (Addendum)
discharge today.   Atypical pain overnight. Feels better this am.   F/U in 2 weeks or so.  EKG good

## 2010-04-20 NOTE — Discharge Summary (Signed)
Physician Discharge Summary     Patient ID:  Natalie Moon  [id number]  77 y.o.  09/30/1929    Admit date: 04/19/2010    Discharge date and time: 3/20 at 3:00pm    Admitting Physician: Seabron Spates, MD     Discharge Physician: Bettey Mare Mykelle Cockerell, PA-C for Dr.Cebe    Admission Diagnoses: ABN NUKE  ABN NUKE    Discharge Diagnoses:   Patient Active Problem List   Diagnoses Date Noted   ??? Abnormal stress test 04/20/2010   ??? CAD (coronary artery disease) 04/20/2010   ??? HTN (hypertension) 04/20/2010   ??? Depression 04/20/2010   ??? Dyslipidemia 04/20/2010   ??? GERD (gastroesophageal reflux disease) 04/20/2010   ??? Hypothyroidism 04/20/2010   ??? Status post coronary artery stent placement 04/20/2010       Cardiology Procedures this admission:  Left heart catheterization with percutaneous coronary intervention          Consults: none    Hospital Course: Pt presented to office to follow up results of carotid duplex and stress test- which was abnormal demonstrating inferolateral ischemia.  She had been having weakness and fatigue with near syncope and postural hypotension.  Cardiac catheterization with possible PCI was felt indicated. Cardiac Risk Factors included hypertension, dyslipidemia and family history of CAD.  EKG was normal. Pt was electively admitted to cardiac telemetry for further evaluation and treatment.     Pt was taken to the cath lab on 3/19  to undergo LHC/PCi by Dr.Cebe.  Radial access was used. The study demonstrated normal LV function, there was minor disease in the LAD with mid 40-50% diffuse disease. Circumflex had minor disease. RCA had an ostial 80% lesion with mid 40%stenosis. A 3.5x15 Xience stent was placed in the ostial RCA using IVUS guidance, which also demonstrated good IVUS and angiographic appearance.      Following the procedure, the patient was transferred back to cardiac telemetry for continued monitoring. Overnight the patient did well with no problems. On cardiac telemetry, sinus  was noted. The patient has increased activity, ambulating in hall without difficulty. PO intake is tolerated. The patient was seen by Dr. Daleen Snook today during rounds and felt to be stable for discharge home.       Discharge Exam: BP 144/64   Pulse 65   Temp 97.1 ??F (36.2 ??C)   Resp 17   Ht 5\' 5"  (1.651 m)   Wt 172 lb (78.019 kg)   BMI 28.62 kg/m2   SpO2 95%       Please see Dr.Cebe's progress note today for exam.  Right radial site clean, dry and intact without hematoma     Recent Results (from the past 24 hour(s))   GLUCOSE, POC    Collection Time    04/19/10  9:13 PM       Component Value Range    POC GLUCOSE 145 (*) 65 - 100 (mg/dL)   METABOLIC PANEL, BASIC    Collection Time    04/20/10  4:56 AM       Component Value Range    Sodium 139  136 - 145 (MMOL/L)    Potassium 3.9  3.5 - 5.1 (MMOL/L)    Chloride 108 (*) 98 - 107 (MMOL/L)    CO2 29  23 - 32 (MMOL/L)    Anion gap 2 (*) 7 - 16 (mmol/L)    Glucose 96  65 - 100 (MG/DL)    BUN 16  8 - 23 (MG/DL)  Creatinine 0.9  0.6 - 1.5 (MG/DL)    GFR est AA >16  >10 (ml/min/1.23m2)    GFR est non-AA >60  >60 (ml/min/1.46m2)    Calcium 8.5  8.3 - 10.4 (MG/DL)   CBC WITH AUTOMATED DIFF    Collection Time    04/20/10  4:56 AM       Component Value Range    WBC 4.0 (*) 4.3 - 11.1 (K/uL)    RBC 3.54 (*) 4.05 - 5.25 (M/uL)    HGB 10.5 (*) 11.7 - 15.4 (g/dL)    HCT 96.0 (*) 45.4 - 46.3 (%)    MCV 92.9  79.6 - 97.8 (FL)    MCH 29.7  26.1 - 32.9 (PG)    MCHC 31.9  31.4 - 35.0 (g/dL)    RDW 09.8  11.9 - 14.7 (%)    PLATELET 188  150 - 450 (K/uL)    MPV 10.5 (*) 10.8 - 14.1 (FL)    DF AUTOMATED      NEUTROPHILS 55  43 - 78 (%)    LYMPHOCYTES 33  13 - 44 (%)    MONOCYTES 10  4.0 - 12.0 (%)    EOSINOPHILS 2  0.5 - 7.8 (%)    BASOPHILS 0  0.0 - 2.0 (%)    IMMATURE GRANULOCYTES 0.3  0.0 - 5.0 (%)     ABS. NEUTROPHILS 2.2  1.7 - 8.2 (K/UL)    ABS. LYMPHOCYTES 1.3  0.5 - 4.6 (K/UL)    ABS. MONOCYTES 0.4  0.1 - 1.3 (K/UL)    ABS. EOSINOPHILS 0.1  0.0 - 0.8 (K/UL)    ABS. BASOPHILS 0.0  0.0 - 0.2 (K/UL)    ABS. IMM. GRANS. 0.0  0.0 - 0.5 (K/UL)     Disposition: home    Patient Instructions:   Patient advised to follow a low cholesterol, low saturated fat diet.  Activity is as tolerated without restriction. Ok to shower. If any bleeding from site, hold pressure x 15 min and call [phone].    Referenced discharge instructions provided by nursing for diet and activity.    Follow-up with [facility], Dr.Cebe on April 9 at 3:45 at [facility] office  Follow-up with PCP, Dr.Patel   In 2-3 weeks.    Current Discharge Medication List      START taking these medications    Details   nitroglycerin (NITROSTAT) 0.4 mg SL tablet 1 Tab by SubLINGual route every five (5) minutes as needed for Chest Pain.  Qty: 100 Tab, Refills: prn      Plavix 75 mg - Take one tablet daily- DO not skip any doses!!!     CONTINUE these medications which have CHANGED    Details   aspirin (ASPIRIN) 325 mg tablet Take 1 Tab by mouth daily.  Qty: 30 Tab, Refills: 11         CONTINUE these medications which have NOT CHANGED    Details   ERGOCALCIFEROL, VITAMIN D2, (VITAMIN D2 PO) Take 1.25 mg by mouth every seven (7) days.        nebivolol (BYSTOLIC) 5 mg tablet Take 5 mg by mouth daily.        valsartan (DIOVAN) 80 mg tablet Take  by mouth daily.        citalopram (CELEXA) 10 mg tablet Take  by mouth daily.        levothyroxine (SYNTHROID) 50 mcg tablet Take  by mouth Daily (before breakfast).        temazepam (  RESTORIL) 15 mg capsule Take  by mouth nightly as needed.        celecoxib (CELEBREX) 200 mg capsule Take 200 mg by mouth daily.      ZETIA 10 mg tablet take 10 mg by mouth daily.       NEXIUM 40 mg capsule take 40 mg by mouth daily.              Prescriptions provided.  SignedJeryl Columbia, PA-C  04/20/2010  2:52 PM     [id number] Zadie Cleverly, MD

## 2010-04-20 NOTE — Procedures (Signed)
[facility] [place]  [facility]                            [address]                           [place], [place]. [address]                                [phone]               [facility] - CATH LAB REPORT    NAME:  Runell, Kovich                          MR:  [id number]  LOC:  SD  [id number]             SEX:  F               ACCT:  [id number]  DOB:  05/03/1929            AGE:  77              PT:  T  ADMIT:  04/19/2010          DSCH:                 MSV:  MED      NAME OF PROCEDURE:  1. Left heart catheterization.  2. Coronary arteriograms.  3. Left ventriculogram.  4. Percutaneous transluminal coronary angioplasty stenting, right  coronary artery.  5. Intravascular ultrasound.    CLINICAL: A 77 year old female with a history of coronary disease who has  had worsening chest pain and an abnormal nuclear medicine stress test  suggesting inferior lateral ischemia. Diagnostic catheterization has  been recommended for further evaluation and possibly coronary  intervention. Risks and options discussed.    PROCEDURE: Brought to the cardiac catheterization lab fasting. A right  radial approach taken after demonstration of satisfactory Allen's test.  Right radial artery was cannulated using modified Seldinger  approach. The catheterization performed using a 6-French Terumo sheath.  The left coronary was injected with a Tiger catheter and the right  coronary with a JR5 catheter. Coronary intervention was done with a  Williams right guide catheter, BHW wire, a 3.0 x 50 Trek  balloon, a 3.5  x 15 Xience drug-eluting stent was deployed to 18 atmospheres at the  ostium of the right coronary artery. Intravascular ultrasound with [facility] system was performed pre and post stent deployment  and revealed a calcific ostial stenosis with a reference lumen of less  than 2 mm and a distal reference vessel 4 mm sq. She tolerated the   procedure well. Contrast was Optiray. Heparin was anticoagulation with  ACT guidance. The patient was pretreated with Plavix and aspirin.    HEMODYNAMICS: Central aortic pressure was 140/80. LV pressure was  140/18.    LEFT VENTRICULOGRAM: Done in RAO 30-degree projection with the JR5  catheter. This revealed normal regional contractility. Ejection fraction  55%.    CORONARY ARTERIOGRAMS: The left and right coronaries were moderately  calcified.    The left main coronary contained minor irregularity, gave rise to the LAD  and circumflex vessels.    There was diffuse calcification and irregularity in the LAD with proximal  eccentric 30% stenosis before the first diagonal. The first diagonal was  moderately large in size. After the first diagonal, there was an  eccentric 30% stenosis. It was free of significant disease. The mid LAD  had a 40% stenosis. It was fairly focal and continued on the cardiac apex  relatively free of significant disease.    The circumflex coronary artery was moderate in size. There was minor  irregularity in the circumflex throughout its course, but no high-grade  stenosis in a moderately large mid posterior lateral system.    Right coronary artery was a dominant vessel supplying the PDA  circulation. There was 80% ostial stenosis. There was diffuse  irregularity throughout the trunk of the right coronary artery, which was  good caliber. There was a focal 40% mid vessel stenosis. There was  moderate calcification at the ostium of the right coronary artery.    In light of the patient's ischemia and her nuclear stress imaging, we  proceeded to coronary intervention. The proximal right coronary artery  was evaluated by intravascular ultrasound and this did reveal an  approximately 80% area of stenosis with moderate calcification, but not  circumferential. It was elected to stent with a 3.5 Xience stent after a  3.0 balloon proved to be satisfactory in expansion of the calcific   segment. We then placed the stent with approximately 2 mm into the aortic  lumen and deployed initially at 10 atmospheres and then withdrew the  balloon and continued to cover the proximal 10 mm of the stent and  inflated up to 18 atmospheres. Intravascular ultrasound was performed and  revealed a marked improvement in the lumen of the right coronary ostium  with a reference lumen of 3.5 to 4 mm and a fairly symmetrical expansion  of the Xience drug-eluting stent. Angiography confirmed a satisfactory  appearance of the stented segment with less than 10% residual stenosis  and good inflow and outflow appearance of the right coronary artery with  TIMI-3 flow to a fairly large distal bed.    IMPRESSION:  1. Coronary disease as described.  2. Successful percutaneous intervention stenting using intravascular  ultrasound guidance of the ostial right coronary artery after  demonstration of the inferior lateral ischemia by nuclear stress testing.    3. Normal left ventricular systolic function.                Seabron Spates, MD                This is an unverified document unless signed by physician.    TID:  [id number]                                      DT:  04/20/2010 12:38 A  JOB:  [id number]        DOC#:  [id number]           DD:  04/19/2010    cc:   Seabron Spates, MD

## 2010-04-20 NOTE — Discharge Summary (Signed)
Patient and discharge formulated with K Mcfadden PA

## 2010-04-20 NOTE — Progress Notes (Signed)
C/O chest discomfort on left side without radiation. Denies any SOB, nausea or EkG changes. Medicated for pain.

## 2010-04-20 NOTE — Procedures (Signed)
[facility] [place]  [facility]                            [address]                           [place], [place] [address]                                [phone]               [facility] - CATH LAB REPORT    NAME:  Natalie Moon, Natalie Moon                          MR:  [id number]  LOC:  SD  [id number]             SEX:  F               ACCT:  [id number]  DOB:  03/20/1929            AGE:  77              PT:  T  ADMIT:  04/19/2010          DSCH:                 MSV:  MED      NAME OF PROCEDURE:  1. Left heart catheterization.  2. Coronary arteriograms.  3. Left ventriculogram.  4. Percutaneous transluminal coronary angioplasty stenting, right  coronary artery.  5. Intravascular ultrasound.    CLINICAL: A 77 year old female with a history of coronary disease who has  had worsening chest pain and an abnormal nuclear medicine stress test  suggesting inferior lateral ischemia. Diagnostic catheterization has  been recommended for further evaluation and possibly coronary  intervention. Risks and options discussed.    PROCEDURE: Brought to the cardiac catheterization lab fasting. A right  radial approach taken after demonstration of satisfactory Allen's test.  Right radial artery was cannulated using modified Seldinger  approach. The catheterization performed using a 6-French Terumo sheath.  The left coronary was injected with a Tiger catheter and the right  coronary with a JR5 catheter. Coronary intervention was done with a  Williams right guide catheter, BHW wire, a 3.0 x 50 Trek  balloon, a 3.5  x 15 Xience drug-eluting stent was deployed to 18 atmospheres at the  ostium of the right coronary artery. Intravascular ultrasound with [facility] system was performed pre and post stent deployment  and revealed a calcific ostial stenosis with a reference lumen of less  than 2 mm and a distal reference vessel 4 mm sq. She tolerated the  procedure well. Contrast was Optiray. Heparin was  anticoagulation with  ACT guidance. The patient was pretreated with Plavix and aspirin.    HEMODYNAMICS: Central aortic pressure was 140/80. LV pressure was  140/18.    LEFT VENTRICULOGRAM: Done in RAO 30-degree projection with the JR5  catheter. This revealed normal regional contractility. Ejection fraction  55%.    CORONARY ARTERIOGRAMS: The left and right coronaries were moderately  calcified.    The left main coronary contained minor irregularity, gave rise to the LAD  and circumflex vessels.    There was diffuse calcification and irregularity in the LAD with proximal  eccentric 30% stenosis before the first diagonal. The first diagonal was  moderately large in size. After the first diagonal, there was an  eccentric 30% stenosis. It was free of significant disease. The mid LAD  had a 40% stenosis. It was fairly focal and continued on the cardiac apex  relatively free of significant disease.    The circumflex coronary artery was moderate in size. There was minor  irregularity in the circumflex throughout its course, but no high-grade  stenosis in a moderately large mid posterior lateral system.    Right coronary artery was a dominant vessel supplying the PDA  circulation. There was 80% ostial stenosis. There was diffuse  irregularity throughout the trunk of the right coronary artery, which was  good caliber. There was a focal 40% mid vessel stenosis. There was  moderate calcification at the ostium of the right coronary artery.    In light of the patient's ischemia and her nuclear stress imaging, we  proceeded to coronary intervention. The proximal right coronary artery  was evaluated by intravascular ultrasound and this did reveal an  approximately 80% area of stenosis with moderate calcification, but not  circumferential. It was elected to stent with a 3.5 Xience stent after a  3.0 balloon proved to be satisfactory in expansion of the calcific  segment. We then placed the stent with approximately 2 mm into the  aortic  lumen and deployed initially at 10 atmospheres and then withdrew the  balloon and continued to cover the proximal 10 mm of the stent and  inflated up to 18 atmospheres. Intravascular ultrasound was performed and  revealed a marked improvement in the lumen of the right coronary ostium  with a reference lumen of 3.5 to 4 mm and a fairly symmetrical expansion  of the Xience drug-eluting stent. Angiography confirmed a satisfactory  appearance of the stented segment with less than 10% residual stenosis  and good inflow and outflow appearance of the right coronary artery with  TIMI-3 flow to a fairly large distal bed.    IMPRESSION:  1. Coronary disease as described.  2. Successful percutaneous intervention stenting using intravascular  ultrasound guidance of the ostial right coronary artery after  demonstration of the inferior lateral ischemia by nuclear stress testing.    3. Normal left ventricular systolic function.                Seabron Spates, MD                This is an unverified document unless signed by physician.    TID:  [id number]                                      DT:  04/20/2010 12:38 A  JOB:  [id number]        DOC#:  [id number]           DD:  04/19/2010    cc:   Seabron Spates, MD

## 2010-04-21 LAB — EKG, 12 LEAD, INITIAL
Atrial Rate: 63 {beats}/min
Atrial Rate: 65 {beats}/min
Calculated P Axis: 61 degrees
Calculated P Axis: 64 degrees
Calculated R Axis: 31 degrees
Calculated R Axis: 49 degrees
Calculated T Axis: 22 degrees
Calculated T Axis: 48 degrees
Diagnosis: NORMAL
Diagnosis: NORMAL
P-R Interval: 160 ms
P-R Interval: 180 ms
Q-T Interval: 422 ms
Q-T Interval: 438 ms
QRS Duration: 82 ms
QRS Duration: 92 ms
QTC Calculation (Bezet): 438 ms
QTC Calculation (Bezet): 448 ms
Ventricular Rate: 63 {beats}/min
Ventricular Rate: 65 {beats}/min

## 2010-05-04 LAB — METABOLIC PANEL, BASIC
Anion gap: 8 mmol/L (ref 7–16)
BUN: 24 MG/DL — ABNORMAL HIGH (ref 8–23)
CO2: 24 MMOL/L (ref 23–32)
Calcium: 8.3 MG/DL (ref 8.3–10.4)
Chloride: 104 MMOL/L (ref 98–107)
Creatinine: 1.3 MG/DL (ref 0.6–1.5)
GFR est AA: 51 mL/min/{1.73_m2} — ABNORMAL LOW (ref >60)
GFR est non-AA: 42 mL/min/{1.73_m2} — ABNORMAL LOW (ref >60)
Glucose: 126 MG/DL — ABNORMAL HIGH (ref 65–100)
Potassium: 4.1 MMOL/L (ref 3.5–5.1)
Sodium: 136 MMOL/L (ref 136–145)

## 2010-05-04 LAB — CBC WITH AUTOMATED DIFF
ABS. BASOPHILS: 0.1 10*3/uL (ref 0.0–0.2)
ABS. EOSINOPHILS: 0.1 10*3/uL (ref 0.0–0.8)
ABS. LYMPHOCYTES: 0.5 10*3/uL (ref 0.5–4.6)
ABS. MONOCYTES: 0.4 10*3/uL (ref 0.1–1.3)
ABS. NEUTROPHILS: 3.9 10*3/uL (ref 1.7–8.2)
BAND NEUTROPHILS: 18 % — ABNORMAL HIGH (ref 0–10)
BASOPHILS: 1 % (ref 0–2)
EOSINOPHILS: 1 % (ref 1–8)
HCT: 36.3 % (ref 35.8–46.3)
HGB: 11.8 g/dL (ref 11.7–15.4)
LYMPHOCYTES: 9 % — ABNORMAL LOW (ref 16–44)
MCH: 30.3 PG (ref 26.1–32.9)
MCHC: 32.5 g/dL (ref 31.4–35.0)
MCV: 93.1 FL (ref 79.6–97.8)
METAMYELOCYTES: 1 %
MONOCYTES: 7 % (ref 3–9)
MPV: 11.2 FL (ref 10.8–14.1)
NEUTROPHILS: 63 % (ref 47–75)
PLATELET COMMENTS: ADEQUATE
PLATELET: 189 10*3/uL (ref 150–450)
RBC: 3.9 M/uL — ABNORMAL LOW (ref 4.05–5.25)
RDW: 14.1 % (ref 11.9–14.6)
WBC: 5 10*3/uL (ref 4.3–11.1)

## 2010-05-04 LAB — URINE MICROSCOPIC
Casts: 0 /LPF
Crystals, urine: 0 /LPF
RBC: 0 /HPF

## 2010-05-04 LAB — POC CARDIAC MARKERS W BNP
BNP: 49 pg/mL (ref 0.0–100.0)
CK-MB: <1 ng/mL (ref 0.0–8.0)
Myoglobin: 46 ng/mL (ref 0–170)
Troponin-I: 0.06 ng/mL (ref 0.00–0.30)

## 2010-05-04 MED ORDER — CEPHALEXIN 500 MG CAP
500 mg | ORAL | Status: AC
Start: 2010-05-04 — End: 2010-05-04
  Administered 2010-05-04: 10:00:00 via ORAL

## 2010-05-04 MED ORDER — CEPHALEXIN 500 MG CAP
500 mg | ORAL_CAPSULE | Freq: Four times a day (QID) | ORAL | Status: AC
Start: 2010-05-04 — End: 2010-05-11

## 2010-05-04 NOTE — ED Notes (Signed)
C/o bilateral arm pain radiating into right shoulder. Stent placed by dr Daleen Snook 2weeks ago. Also c/o feeling "cold" last pm

## 2010-05-04 NOTE — ED Notes (Signed)
States she was feeling cold and unable to get warm.  Then began to breath "real hard", that's when patient noted bilateral arm pain.
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
## 2010-05-04 NOTE — ED Notes (Signed)
Resting quietly.  Family at bedside.  No distress noted.  Report given to Rosezena Sensor RN.

## 2010-05-05 LAB — CULTURE, URINE
Culture result:: >100000
Culture: >100000

## 2010-05-17 LAB — HM COLONOSCOPY

## 2010-09-09 LAB — METABOLIC PANEL, BASIC
Anion gap: 7 mmol/L (ref 7–16)
BUN: 21 MG/DL (ref 8–23)
CO2: 29 MMOL/L (ref 21–32)
Calcium: 8.8 MG/DL (ref 8.3–10.4)
Chloride: 107 MMOL/L (ref 98–107)
Creatinine: 1.2 MG/DL — ABNORMAL HIGH (ref 0.6–1.0)
GFR est AA: 56 mL/min/{1.73_m2} — ABNORMAL LOW (ref >60)
GFR est non-AA: 46 mL/min/{1.73_m2} — ABNORMAL LOW (ref >60)
Glucose: 89 MG/DL (ref 65–100)
Potassium: 3.9 MMOL/L (ref 3.5–5.1)
Sodium: 143 MMOL/L (ref 136–145)

## 2010-09-09 LAB — POC TROPONIN: Troponin-I (POC): 0.07 ng/ml (ref 0.0–0.08)

## 2010-09-09 MED ORDER — AMLODIPINE 10 MG TAB
10 mg | ORAL | Status: AC
Start: 2010-09-09 — End: 2010-09-09
  Administered 2010-09-09: 05:00:00 via ORAL

## 2010-09-09 NOTE — ED Notes (Signed)
.  I have reviewed discharge instructions with the patient.  The patient verbalized understanding. Pt/family advised is s/s are not improved in 12-24 hours, OR if at any time they feel their condition is worsening, they should seek re-evaluation by their PMD or ER.
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
## 2010-09-09 NOTE — ED Notes (Addendum)
C/o left sided nonradiating chest pain and nausea. Onset approx 2200 this evening while playing dice. Denies shortness of breath or diaphoresis. Followed by dr Daleen Snook. Stent placed approx 3months ago. C/o tingling lips and left side. No facial droop slurred speech or weakness noted with grips

## 2010-09-10 LAB — EKG, 12 LEAD, INITIAL
Atrial Rate: 59 {beats}/min
Calculated P Axis: 85 degrees
Calculated R Axis: 64 degrees
Calculated T Axis: 53 degrees
P-R Interval: 158 ms
Q-T Interval: 428 ms
QRS Duration: 84 ms
QTC Calculation (Bezet): 423 ms
Ventricular Rate: 59 {beats}/min

## 2010-12-27 NOTE — Progress Notes (Signed)
Outpatient Rehab at [facility] [address]  [address], [address] [place], [place] [address]  Phone: [phone]   Fax: [phone]  Outpatient PHYSICAL THERAPY: Initial Assessment  Fall Risk Score: 3 (? 5 = High Risk)  Treatment Diagnosis: Abnormal involuntary movements; abnormality of gait  REFERRING PHYSICIAN: Dr. Allena Katz   MD Orders: Evaluate and treat   Return Physician Appointment: Unknown   MEDICAL/REFERRING DIAGNOSIS: Frequent falls  DATE OF ONSET: Chronic- years    PRIOR LEVEL OF FUNCTION: Independent   PRECAUTIONS/ALLERGIES: NKDA  ASSESSMENT:  ????????This section established at most recent assessment??????????  Patient presents with imbalance, decreased lower extremity strength, and impaired gait related to chronic debility.  Patient is at higher fall risk based on Berg Balance Scale.  Patient would benefit from physical therapy to improve strength, improve balance and gait to maximize safety with mobility and activities of daily living.   PROBLEM LIST (Impairments causing functional limitations):  1. Decreased Strength affecting function  2. Other Imbalance and impaired gait affecting function  GOALS: (Goals have been discussed and agreed upon with patient.)  SHORT-TERM FUNCTIONAL GOALS: Time Frame: 4 weeks  1. Patient will be independent with home exercise program to improve balance and decrease fall risk.  2. Patient will increase score on Berg Balance Scale to greater than or equal to 30/56 demonstrating improved balance and decreased fall risk with daily activities.  DISCHARGE GOALS: Time Frame: 12 weeks  1. Patient will increase score on Berg Balance Scale to greater than or equal to 40/56 demonstrating improved balance and decreased fall risk with daily activities.  2. Patient will increase lower extremity strength to greater than or equal to 4/5 to improve ease with mobility.    3. Patient will report increased ease with overall mobility due to improved strength and improved balance.   REHABILITATION POTENTIAL FOR STATED GOALS: GoodPLAN OF CARE:  INTERVENTIONS PLANNED: (Benefits and precautions of physical therapy have been discussed with the patient.)  1. balance exercise  2. gait training  3. therapeutic exercise/strengthening  TREATMENT PLAN EFFECTIVE DATES: 12/27/2010 TO 03/21/2011  FREQUENCY/DURATION: Follow patient twice daily for 12 weeks to address above goals.  Regarding Jenniger Figiel Falco's therapy, I certify that the treatment plan above will be carried out by a therapist or under their direction.  Thank you for this referral,  Atziry Baranski Gevena Barre, PT     Referring Physician Signature:          Date           SUBJECTIVE:  History of Present Injury/Illness (Reason for Referral): Patient complains of imbalance and weakness.  Patient uses a single point cane for ambulation.  Patient has had no falls since March 2012.  Patient reports having falls prior to March due to cardiac issues.  Patient had stent placed in heart in March which helped prevent patient from passing out and falling.  Patient has had no falls since March.  Patient rates bilateral hip pain and back pain as 6/10 due to arthritis.  Patient recently moved into [facility] for assistance.   Present Symptoms: Imbalance, impaired gait, bilateral lower extremity weakness  Pain Intensity 1: 6  Dominant Side:   Past Medical History: Stent in heart, arthritis, HTN, hepatitis C, COPD, appendectomy, cholecystectomy, hysterectomy, part of colon removed, colon cancer, peptic ulcer disease, thyroid disease, GERD, right arm surgery, tonsillectomy.   Current Medications: See medication list in paper chart.    Date Last Reviewed: 12/27/2010  Social History/Home Situation:  Lives alone at [facility] History: Retired   OBJECTIVE:  GROSS PRESENTATION/POSTURE: Within normal limits  FUNCTIONAL MOBILITY:   ?? Bed Mobility:  Independent      ?? Transfers: Independent        LE STRENGTH:         LLE Strength  L Hip Flexion: 4-  L Hip ABduction: 4-  L Knee Flexion: 3+  L Knee Extension: 4  L Ankle Dorsiflexion: 3  L Ankle Plantar Flexion: 4+    RLE Strength  R Hip Flexion: 3+  R Hip ABduction: 2+  R Knee Flexion: 2-  R Knee Extension: 3+  R Ankle Dorsiflexion: 4-  R Ankle Plantar Flexion: 4-     SENSATION: Decreased sensation right lower extremity     POSTURAL CONTROL & BALANCE OBJECTIVE MEASURES:    Berg Balance Scale:  26/56.    (A score less than 45/56 indicates high risk of falls)    Comments: Patient relies on upper extremity support for sit to stand transitions.  Patient unable to pick up an object from the floor or perform alternate foot taps on step without upper extremity support due to imbalance.     Dynamic Gait Index: Not tested    Smart Equitest Sensory Organization Test:  Not tested.    QUALITY OF GAIT: Patient ambulates with single point cane demonstrating decreased heel strike on initial contact with decreased step length and decreased gait speed.  Patient corrected regarding proper sequencing with ambulation and single point cane. Patient had to rest after minimal walk due to shortness of breath and fatigue.  O2:97 and HR:67 after ambulation.     OBJECTIVE MEASURES: see above.   TREATMENT:    (In addition to Assessment/Re-Assessment sessions the following treatments were rendered)  Initial Evaluation: 60 minutes  Manual Therapy (     ):     HEP: Patient given home exercise program consisting of balance exercises.  ___________________________________________________  Treatment Assessment:  Patient fatigues and becomes short of breath very quickly with minimal exertion.  Blood pressure taken before evaluation was 136/52.  Progression/Medical Necessity:   ?? Patient is expected to demonstrate progress in strength and balance to improve safety during activities of daily living.   Compliance with Program/Exercises: Will assess as treatment progresses.   Reason for Continuation of Services/Other Comments:  ?? Patient continues to require skilled intervention due to higher fall risk with daily activities.  Recommendations/Intent for next treatment session: "Treatment next visit will focus on balance exercises and strengthening exercises".    Total Treatment Duration:  PT Patient Time In/Time Out  Time In: 1353  Time Out: 1453    Jaeda Bruso D Teodoro Kil, PT

## 2010-12-28 NOTE — Progress Notes (Signed)
Ambulatory/Rehab Services H2 Model Falls Risk Assessment    Risk Factor Pts. ??   Confusion/Disorientation/Impulsivity  []    4 ??   Symptomatic Depression  []   2 ??   Altered Elimination  []   1 ??   Dizziness/Vertigo  [x]   1 ??   Gender (Female)  []   1 ??   Any administered antiepileptics (anticonvulsants):  []   2 ??   Any administered benzodiazepines:  [x]   1 ??   Visual Impairment (specify):  []   1 ??   Portable Oxygen Use  []   1 ??   Orthostatic ? BP  []   1 ??   History of Recent Falls (within 3 mos.)  []   5     Ability to Rise from Chair (choose one) Pts. ??   Ability to rise in a single movement  []   0 ??   Pushes up, successful in one attempt  [x]   1 ??   Multiple attempts, but successful  []   3 ??   Unable to rise without assistance  []   4   Total: (5 or greater = High Risk) 3     Falls Prevention Plan:   []                Physical Limitations to Exercise (specify):   []                Mobility Assistance Device (type):   []                Exercise/Equipment Adaptation (specify):    ??2010 [employer] All Rights Reserved. [place] Patent #7,[id number]. Federal Law prohibits the replication, distribution or use without written permission from [employer]

## 2010-12-31 NOTE — Progress Notes (Signed)
Outpatient Rehab at [facility] [address]  [address], [address] [place], [place] [address]  Phone: [phone]   Fax: [phone]  Outpatient PHYSICAL THERAPY: Daily Note  Fall Risk Score: 3 (? 5 = High Risk)  Treatment Diagnosis: Abnormal involuntary movements; abnormality of gait   REFERRING PHYSICIAN: Dr. Allena Katz   MD Orders: Evaluate and treat   Return Physician Appointment: Unknown   MEDICAL/REFERRING DIAGNOSIS: Frequent falls  DATE OF ONSET: Chronic- years    PRIOR LEVEL OF FUNCTION: Independent   PRECAUTIONS/ALLERGIES: NKDA  ASSESSMENT:  ????????This section established at most recent assessment??????????  Patient presents with imbalance, decreased lower extremity strength, and impaired gait related to chronic debility.  Patient is at higher fall risk based on Berg Balance Scale.  Patient would benefit from physical therapy to improve strength, improve balance and gait to maximize safety with mobility and activities of daily living.   PROBLEM LIST (Impairments causing functional limitations):  1. Decreased Strength affecting function  2. Other Imbalance and impaired gait affecting function  GOALS: (Goals have been discussed and agreed upon with patient.)  SHORT-TERM FUNCTIONAL GOALS: Time Frame: 4 weeks  1. Patient will be independent with home exercise program to improve balance and decrease fall risk.  2. Patient will increase score on Berg Balance Scale to greater than or equal to 30/56 demonstrating improved balance and decreased fall risk with daily activities.  DISCHARGE GOALS: Time Frame: 12 weeks  1. Patient will increase score on Berg Balance Scale to greater than or equal to 40/56 demonstrating improved balance and decreased fall risk with daily activities.  2. Patient will increase lower extremity strength to greater than or equal to 4/5 to improve ease with mobility.    3. Patient will report increased ease with overall mobility due to improved strength and improved balance.   REHABILITATION POTENTIAL FOR STATED GOALS: GoodPLAN OF CARE:  INTERVENTIONS PLANNED: (Benefits and precautions of physical therapy have been discussed with the patient.)  1. balance exercise  2. gait training  3. therapeutic exercise/strengthening  TREATMENT PLAN EFFECTIVE DATES: 12/27/2010 TO 03/21/2011  FREQUENCY/DURATION: Follow patient twice daily for 12 weeks to address above goals.       SUBJECTIVE:  Patient reports she was standing in line at the grocery store and felt like she was going to pass out.  Patient seeing primary care doctor and cardiologist next week and is going to tell them about this episode.    History of Present Injury/Illness (Reason for Referral): Patient complains of imbalance and weakness.  Patient uses a single point cane for ambulation.  Patient has had no falls since March 2012.  Patient reports having falls prior to March due to cardiac issues.  Patient had stent placed in heart in March which helped prevent patient from passing out and falling.  Patient has had no falls since March.  Patient rates bilateral hip pain and back pain as 6/10 due to arthritis.  Patient recently moved into [facility] for assistance.   Present Symptoms: Imbalance, impaired gait, bilateral lower extremity weakness  Pain Intensity 1: 6  Dominant Side:   Past Medical History: Stent in heart, arthritis, HTN, hepatitis C, COPD, appendectomy, cholecystectomy, hysterectomy, part of colon removed, colon cancer, peptic ulcer disease, thyroid disease, GERD, right arm surgery, tonsillectomy.   Current Medications: See medication list in paper chart.    Date Last Reviewed: 12/31/2010  Social History/Home Situation: Lives alone at [employer] History:  Retired   OBJECTIVE:  GROSS PRESENTATION/POSTURE: Within normal limits  FUNCTIONAL MOBILITY:  ?? Bed Mobility:  Independent       ?? Transfers: Independent        LE STRENGTH:                    SENSATION: Decreased sensation right lower extremity     POSTURAL CONTROL & BALANCE OBJECTIVE MEASURES:    Berg Balance Scale:  26/56.    (A score less than 45/56 indicates high risk of falls)    Comments: Patient relies on upper extremity support for sit to stand transitions.  Patient unable to pick up an object from the floor or perform alternate foot taps on step without upper extremity support due to imbalance.     Dynamic Gait Index: Not tested    Smart Equitest Sensory Organization Test:  Not tested.    QUALITY OF GAIT: Patient ambulates with single point cane demonstrating decreased heel strike on initial contact with decreased step length and decreased gait speed.  Patient corrected regarding proper sequencing with ambulation and single point cane. Patient had to rest after minimal walk due to shortness of breath and fatigue.  O2:97 and HR:67 after ambulation.     OBJECTIVE MEASURES: see above.   TREATMENT:    (In addition to Assessment/Re-Assessment sessions the following treatments were rendered)  Neuromuscular Re-education: (15 Minutes):  Exercise/activities per grid below to improve balance.  Required minimal verbal cues to promote dynamic balance in standing.  Balance/Vestibular Treatment:   Activity   Date  12/31/10 Date Date Date Date Date   Activity/Exercise   Sets/reps/equipment Sets/reps/  equipment Sets/reps/  equipment Sets/reps/  equipment Sets/reps/  equipment Sets/reps/  equipment   Walking with head turns             Walking with head up & down             Step ups             Step taps             Marching   2 laps        Sidestepping   2 laps        Crossovers           [place]           Walking  backwards             Tandem walking           Weaving in/out of cones             Picking up cones             Sports cord             [employer] ball           Figure 8s            Circles right/left            Walking with 360 degree turns           Spirals           Weight shifting:    Left & Right             Weight shifting:  Forward & Backward              Static Standing Balance  Standing with feet apart             Standing with feet together             Standing with feet semitandem           Standing with feet tandem           Single leg stance           X1/X2 Viewing exercises             Hallpike-Dix testing for BPPV (Benign Paroxysmal Positional Vertigo)             Brandt-Daroff exercises           Canalith Repositioning treatment/Epley Maneuver  for BPPV (Benign Paroxysmal Positional Vertigo)           Smart Equitest Training: See scanned report.                   Manual Therapy (     ):     HEP: Patient given home exercise program consisting of balance exercises.  ___________________________________________________  Treatment Assessment:  Patient felt lightheaded and like she was going to pass out after marching and sidestepping.  Blood pressure was taken and it was 125/67.  Patient was sent home early from therapy due to not feeling well.  Progression/Medical Necessity:   ?? Patient is expected to demonstrate progress in strength and balance to improve safety during activities of daily living.  Compliance with Program/Exercises: Will assess as treatment progresses.   Reason for Continuation of Services/Other Comments:  ?? Patient continues to require skilled intervention due to higher fall risk with daily activities.  Recommendations/Intent for next treatment session: "Treatment next visit will focus on balance exercises and strengthening exercises".    Total Treatment Duration:  PT Patient Time In/Time Out  Time In: 1115  Time Out: 1130    Sajan Cheatwood D Teodoro Kil, PT

## 2011-01-04 NOTE — Progress Notes (Signed)
Outpatient Rehab at [facility] [address]  [address], [address] [place], [place] [address]  Phone: [phone]   Fax: [phone]  OUTPATIENT DAILY NOTE    NAME/AGE/GENDER: Natalie Moon is a 77 y.o. female.     DATE: 01/04/2011    Patient cancelled for appointment today due to illness.  Will plan to follow up on next scheduled visit.    Luetta Piazza Gevena Barre, PT

## 2011-01-07 LAB — MRI/CT POC CREATININE
Creatinine (POC): 1.1 MG/DL — ABNORMAL HIGH (ref 0.6–1.0)
GFRAA, POC: >60 mL/min/{1.73_m2} (ref >60)
GFRNA, POC: 51 mL/min/{1.73_m2} — ABNORMAL LOW (ref >60)

## 2011-01-07 MED ORDER — DIATRIZOATE MEGLUMINE & SODIUM 66 %-10 % ORAL SOLN
66-10 % | Freq: Once | ORAL | Status: AC
Start: 2011-01-07 — End: 2011-01-07
  Administered 2011-01-07: 17:00:00 via ORAL

## 2011-01-07 MED ORDER — SODIUM CHLORIDE 0.9% BOLUS IV
0.9 % | Freq: Once | INTRAVENOUS | Status: AC
Start: 2011-01-07 — End: 2011-01-07
  Administered 2011-01-07: 17:00:00 via INTRAVENOUS

## 2011-01-07 MED ORDER — SALINE PERIPHERAL FLUSH PRN
Freq: Once | INTRAMUSCULAR | Status: AC
Start: 2011-01-07 — End: 2011-01-07
  Administered 2011-01-07: 17:00:00

## 2011-01-07 MED ORDER — IOVERSOL 350 MG/ML IV SOLN
350 mg iodine/mL | Freq: Once | INTRAVENOUS | Status: AC
Start: 2011-01-07 — End: 2011-01-07
  Administered 2011-01-07: 17:00:00 via INTRAVENOUS

## 2011-01-07 NOTE — Progress Notes (Signed)
Patient did not receive PT this AM due to patient no showed .  Majel Homer, PT  01/07/2011

## 2011-01-12 NOTE — Progress Notes (Signed)
[facility] [address], [address] [place], [place] [address]  Phone: [phone]   Fax: [phone]  Outpatient PHYSICAL THERAPY: Discharge  Fall Risk Score: 3 (? 5 = High Risk)     REFERRING PHYSICIAN: Dr. Allena Katz   MD Orders: Evaluate and treat   Return Physician Appointment: Unknown   MEDICAL/REFERRING DIAGNOSIS: Frequent falls  DATE OF ONSET: Chronic- years    PRIOR LEVEL OF FUNCTION: Independent   PRECAUTIONS/ALLERGIES: NKDA  ASSESSMENT:  ????????This section established at most recent assessment??????????  Patient attended two scheduled physical therapy appointments from 12/27/10 to 12/31/10.  Patient cancelled next scheduled appointment and then no showed for next appointment.  Patient did not call to schedule any further appointments.  Problems and goals were unable to be reassessed.  Patient discharged from physical therapy at this time.    PROBLEM LIST (Impairments causing functional limitations):  1. Decreased Strength affecting function  2. Other Imbalance and impaired gait affecting function  GOALS: (Goals have been discussed and agreed upon with patient.)  SHORT-TERM FUNCTIONAL GOALS:   1. Patient will be independent with home exercise program to improve balance and decrease fall risk. Goal unable to be reassessed.   2. Patient will increase score on Berg Balance Scale to greater than or equal to 30/56 demonstrating improved balance and decreased fall risk with daily activities. Goal unable to be reassessed.  DISCHARGE GOALS:   1. Patient will increase score on Berg Balance Scale to greater than or equal to 40/56 demonstrating improved balance and decreased fall risk with daily activities.  Goal unable to be reassessed.   2. Patient will increase lower extremity strength to greater than or equal to 4/5 to improve ease with mobility.  Goal unable to be reassessed.    3. Patient will report increased ease with overall mobility due to improved strength and improved balance.  Goal unable to be reassessed.   PLAN OF CARE:  INTERVENTIONS PLANNED: (Benefits and precautions of physical therapy have been discussed with the patient.)  1. balance exercise  2. gait training  3. therapeutic exercise/strengthening  FREQUENCY/DURATION: Patient discharged from physical therapy.      SUBJECTIVE:  History of Present Injury/Illness (Reason for Referral): Patient complains of imbalance and weakness.  Patient uses a single point cane for ambulation.  Patient has had no falls since March 2012.  Patient reports having falls prior to March due to cardiac issues.  Patient had stent placed in heart in March which helped prevent patient from passing out and falling.  Patient has had no falls since March.  Patient rates bilateral hip pain and back pain as 6/10 due to arthritis.  Patient recently moved into [facility] for assistance.   Present Symptoms: Imbalance, impaired gait, bilateral lower extremity weakness     Dominant Side:   Past Medical History: Stent in heart, arthritis, HTN, hepatitis C, COPD, appendectomy, cholecystectomy, hysterectomy, part of colon removed, colon cancer, peptic ulcer disease, thyroid disease, GERD, right arm surgery, tonsillectomy.   Current Medications: See medication list in paper chart.      Social History/Home Situation: Lives alone at [employer] History: Retired   OBJECTIVE:  GROSS PRESENTATION/POSTURE: Within normal limits  FUNCTIONAL MOBILITY:  ?? Bed Mobility:  Independent      ?? Transfers: Independent        LE STRENGTH:  SENSATION: Decreased sensation right lower extremity     POSTURAL CONTROL & BALANCE OBJECTIVE MEASURES:    Berg Balance Scale:  26/56.    (A score less than 45/56 indicates high risk of falls)     Comments: Patient relies on upper extremity support for sit to stand transitions.  Patient unable to pick up an object from the floor or perform alternate foot taps on step without upper extremity support due to imbalance.     Dynamic Gait Index: Not tested    Smart [employer] Sensory Organization Test:  Not tested.    QUALITY OF GAIT: Patient ambulates with single point cane demonstrating decreased heel strike on initial contact with decreased step length and decreased gait speed.  Patient corrected regarding proper sequencing with ambulation and single point cane. Patient had to rest after minimal walk due to shortness of breath and fatigue.  O2:97 and HR:67 after ambulation.     OBJECTIVE MEASURES: see above.   Natalie Moon Natalie Moon, PT

## 2011-03-15 LAB — METABOLIC PANEL, COMPREHENSIVE
A-G Ratio: 0.8 — ABNORMAL LOW (ref 1.2–3.5)
ALT (SGPT): 21 U/L (ref 12–65)
AST (SGOT): 32 U/L (ref 15–37)
Albumin: 3.1 g/dL — ABNORMAL LOW (ref 3.2–4.6)
Alk. phosphatase: 98 U/L (ref 50–136)
Anion gap: 8 mmol/L (ref 7–16)
BUN: 23 MG/DL (ref 8–23)
Bilirubin, total: 0.4 MG/DL (ref 0.2–1.1)
CO2: 25 MMOL/L (ref 21–32)
Calcium: 7.9 MG/DL — ABNORMAL LOW (ref 8.3–10.4)
Chloride: 106 MMOL/L (ref 98–107)
Creatinine: 1.08 MG/DL — ABNORMAL HIGH (ref 0.6–1.0)
GFR est AA: >60 mL/min/{1.73_m2} (ref >60)
GFR est non-AA: 52 mL/min/{1.73_m2} — ABNORMAL LOW (ref >60)
Globulin: 3.7 g/dL — ABNORMAL HIGH (ref 2.3–3.5)
Glucose: 119 MG/DL — ABNORMAL HIGH (ref 65–100)
Potassium: 4.3 MMOL/L (ref 3.5–5.1)
Protein, total: 6.8 g/dL (ref 6.3–8.2)
Sodium: 139 MMOL/L (ref 136–145)

## 2011-03-15 LAB — CBC WITH AUTOMATED DIFF
ABS. BASOPHILS: 0 10*3/uL (ref 0.0–0.2)
ABS. EOSINOPHILS: 0.1 10*3/uL (ref 0.0–0.8)
ABS. IMM. GRANS.: 0 10*3/uL (ref 0.0–0.5)
ABS. LYMPHOCYTES: 0.8 10*3/uL (ref 0.5–4.6)
ABS. MONOCYTES: 0.3 10*3/uL (ref 0.1–1.3)
ABS. NEUTROPHILS: 3 10*3/uL (ref 1.7–8.2)
BASOPHILS: 0 % (ref 0.0–2.0)
EOSINOPHILS: 1 % (ref 0.5–7.8)
HCT: 31.3 % — ABNORMAL LOW (ref 35.8–46.3)
HGB: 10.2 g/dL — ABNORMAL LOW (ref 11.7–15.4)
IMMATURE GRANULOCYTES: 0 % (ref 0.0–5.0)
LYMPHOCYTES: 20 % (ref 13–44)
MCH: 30.5 PG (ref 26.1–32.9)
MCHC: 32.6 g/dL (ref 31.4–35.0)
MCV: 93.7 FL (ref 79.6–97.8)
MONOCYTES: 7 % (ref 4.0–12.0)
MPV: 10.7 FL — ABNORMAL LOW (ref 10.8–14.1)
NEUTROPHILS: 72 % (ref 43–78)
PLATELET: 193 10*3/uL (ref 150–450)
RBC: 3.34 M/uL — ABNORMAL LOW (ref 4.05–5.25)
RDW: 14.4 % (ref 11.9–14.6)
WBC: 4.1 10*3/uL — ABNORMAL LOW (ref 4.3–11.1)

## 2011-03-15 LAB — POC TROPONIN: Troponin-I (POC): 0 ng/ml (ref 0.0–0.08)

## 2011-03-15 NOTE — ED Notes (Signed)
Patient is without distress remains alert discharged to home with family

## 2011-03-15 NOTE — ED Notes (Signed)
Per EMS, patient complaining of dizziness, chest pain, nausea

## 2011-03-15 NOTE — ED Notes (Signed)
Dr Verlon Au at bedside to evaluate patient. No acute distress noted

## 2011-03-15 NOTE — ED Notes (Signed)
Patient voided on the bedpan again spilling over the sides.  Bed changed, patient provided with clean dry sheets and skin care.  Patient will awaiting repeat troponin

## 2011-03-15 NOTE — ED Notes (Signed)
Bedside report to doris rn

## 2011-03-15 NOTE — ED Notes (Signed)
Labs drawn and sent. Assessment complete. No acute distress noted.

## 2011-03-16 LAB — URINALYSIS W/O MICRO
Bilirubin: NEGATIVE
Blood: NEGATIVE
Glucose: NEGATIVE MG/DL
Ketone: NEGATIVE MG/DL
Nitrites: NEGATIVE
Protein: NEGATIVE MG/DL
Specific gravity: 1.01 (ref 1.001–1.023)
Urobilinogen: 0.2 EU/DL (ref 0.2–1.0)
pH (UA): 7 (ref 5.0–9.0)

## 2011-03-16 LAB — URINE MICROSCOPIC
Casts: 0 /LPF
Crystals, urine: 0 /LPF
Mucus: 0 /LPF

## 2011-03-16 LAB — EKG, 12 LEAD, INITIAL
Atrial Rate: 58 {beats}/min
Calculated P Axis: 69 degrees
Calculated R Axis: 47 degrees
Calculated T Axis: 53 degrees
P-R Interval: 172 ms
Q-T Interval: 422 ms
QRS Duration: 78 ms
QTC Calculation (Bezet): 414 ms
Ventricular Rate: 58 {beats}/min

## 2011-03-16 LAB — POC TROPONIN: Troponin-I (POC): 0 ng/ml (ref 0.0–0.08)

## 2011-03-16 MED ORDER — MECLIZINE 25 MG TAB
25 mg | ORAL_TABLET | Freq: Three times a day (TID) | ORAL | Status: AC | PRN
Start: 2011-03-16 — End: 2011-06-13
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
## 2011-06-03 ENCOUNTER — Encounter

## 2011-06-04 LAB — IRON,TIBC AND FERRITIN PANEL: Ferritin: 17

## 2011-11-14 DIAGNOSIS — F419 Anxiety disorder, unspecified: Secondary | ICD-10-CM | POA: Insufficient documentation

## 2011-11-14 DIAGNOSIS — M1990 Unspecified osteoarthritis, unspecified site: Secondary | ICD-10-CM | POA: Insufficient documentation

## 2012-01-11 NOTE — Telephone Encounter (Signed)
Pharmacy called wanting to refill pts Synthriod dr refilled 01/10/12 didn't go through I called pharmacy back to fix per dr.
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
## 2012-06-29 ENCOUNTER — Encounter

## 2012-07-03 NOTE — Telephone Encounter (Signed)
I am faxing over note stating pt has copd ands needs to be relocated at [facility] she currently lives above a smoker.cv

## 2012-07-19 NOTE — ED Notes (Signed)
Reports left hip pain onset about two days ago, states that she does not think she fell or injured it, pain just keeps increasing over past two days, increases with ambulation. Denies urinary symptoms except that it seems darker in color
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
## 2012-07-20 MED ADMIN — HYDROcodone-acetaminophen (NORCO) 7.5-325 mg per tablet 1 Tab: ORAL | @ 04:00:00 | NDC 00406036623

## 2012-07-20 MED FILL — HYDROCODONE-ACETAMINOPHEN 7.5 MG-325 MG TAB: ORAL | Qty: 1

## 2012-07-20 NOTE — ED Notes (Signed)
I have reviewed discharge instructions with the patient.  The patient verbalized understanding.

## 2012-07-20 NOTE — ED Notes (Signed)
No Fx. Does have some DJD L-spine, esp L-4
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
## 2012-08-16 LAB — AMB POC COMPLETE CBC,AUTOMATED ENTER

## 2012-08-16 LAB — AMB POC URINALYSIS DIP STICK AUTO W/O MICRO
Bilirubin (UA POC): NEGATIVE
Blood (UA POC): NEGATIVE
Glucose (UA POC): NEGATIVE mg/dL
Ketones (UA POC): NEGATIVE
Leukocyte esterase (UA POC): NEGATIVE
Nitrites (UA POC): NEGATIVE
Protein (UA POC): NEGATIVE
Specific gravity (UA POC): 1.02 (ref 1.001–1.035)
Urobilinogen (UA POC): 1 (ref 0.2–1)
pH (UA POC): 7 (ref 4.6–8.0)

## 2012-08-17 LAB — METABOLIC PANEL, COMPREHENSIVE
A-G Ratio: 1.4 (ref 1.1–2.5)
ALT (SGPT): 10 IU/L (ref 0–32)
AST (SGOT): 14 IU/L (ref 0–40)
Albumin: 4 g/dL (ref 3.5–4.7)
Alk. phosphatase: 97 IU/L (ref 39–117)
BUN/Creatinine ratio: 19 (ref 11–26)
BUN: 20 mg/dL (ref 8–27)
Bilirubin, total: 0.2 mg/dL (ref 0.0–1.2)
CO2: 26 mmol/L (ref 18–29)
Calcium: 9.2 mg/dL (ref 8.6–10.2)
Chloride: 104 mmol/L (ref 97–108)
Creatinine: 1.08 mg/dL — ABNORMAL HIGH (ref 0.57–1.00)
GFR est AA: 56 mL/min/{1.73_m2} — ABNORMAL LOW (ref >59)
GFR est non-AA: 49 mL/min/{1.73_m2} — ABNORMAL LOW (ref >59)
GLOBULIN, TOTAL: 2.8 g/dL (ref 1.5–4.5)
Glucose: 100 mg/dL — ABNORMAL HIGH (ref 65–99)
Potassium: 4.9 mmol/L (ref 3.5–5.2)
Protein, total: 6.8 g/dL (ref 6.0–8.5)
Sodium: 142 mmol/L (ref 134–144)

## 2012-08-17 LAB — LIPID PANEL
Cholesterol, total: 224 mg/dL — ABNORMAL HIGH (ref 100–199)
HDL Cholesterol: 90 mg/dL (ref >39)
LDL, calculated: 103 mg/dL — ABNORMAL HIGH (ref 0–99)
Triglyceride: 153 mg/dL — ABNORMAL HIGH (ref 0–149)
VLDL, calculated: 31 mg/dL (ref 5–40)

## 2012-08-17 LAB — TSH 3RD GENERATION: TSH: 1.64 u[IU]/mL (ref 0.450–4.500)

## 2012-08-17 LAB — T4 (THYROXINE): T4, Total: 5.2 ug/dL (ref 4.5–12.0)
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
## 2013-02-18 LAB — EKG, 12 LEAD, INITIAL
Atrial Rate: 57 {beats}/min
Calculated P Axis: 66 degrees
Calculated R Axis: 40 degrees
Calculated T Axis: 37 degrees
P-R Interval: 160 ms
Q-T Interval: 442 ms
QRS Duration: 80 ms
QTC Calculation (Bezet): 430 ms
Ventricular Rate: 57 {beats}/min

## 2013-02-18 LAB — CBC WITH AUTOMATED DIFF
ABS. BASOPHILS: 0 10*3/uL (ref 0.0–0.2)
ABS. EOSINOPHILS: 0 10*3/uL (ref 0.0–0.8)
ABS. IMM. GRANS.: 0 10*3/uL (ref 0.0–0.5)
ABS. LYMPHOCYTES: 0.6 10*3/uL (ref 0.5–4.6)
ABS. MONOCYTES: 0.5 10*3/uL (ref 0.1–1.3)
ABS. NEUTROPHILS: 5.3 10*3/uL (ref 1.7–8.2)
BASOPHILS: 0 % (ref 0.0–2.0)
EOSINOPHILS: 1 % (ref 0.5–7.8)
HCT: 33.1 % — ABNORMAL LOW (ref 35.8–46.3)
HGB: 10.4 g/dL — ABNORMAL LOW (ref 11.7–15.4)
IMMATURE GRANULOCYTES: 0.2 % (ref 0.0–5.0)
LYMPHOCYTES: 9 % — ABNORMAL LOW (ref 13–44)
MCH: 30.5 PG (ref 26.1–32.9)
MCHC: 31.4 g/dL (ref 31.4–35.0)
MCV: 97.1 FL (ref 79.6–97.8)
MONOCYTES: 7 % (ref 4.0–12.0)
MPV: 11.1 FL (ref 10.8–14.1)
NEUTROPHILS: 83 % — ABNORMAL HIGH (ref 43–78)
PLATELET: 206 10*3/uL (ref 150–450)
RBC: 3.41 M/uL — ABNORMAL LOW (ref 4.05–5.25)
RDW: 14.9 % — ABNORMAL HIGH (ref 11.9–14.6)
WBC: 6.4 10*3/uL (ref 4.3–11.1)

## 2013-02-18 LAB — METABOLIC PANEL, COMPREHENSIVE
A-G Ratio: 1 — ABNORMAL LOW (ref 1.2–3.5)
ALT (SGPT): 16 U/L (ref 12–65)
AST (SGOT): 19 U/L (ref 15–37)
Albumin: 3.5 g/dL (ref 3.2–4.6)
Alk. phosphatase: 95 U/L (ref 50–136)
Anion gap: 5 mmol/L — ABNORMAL LOW (ref 7–16)
BUN: 17 MG/DL (ref 8–23)
Bilirubin, total: 0.3 MG/DL (ref 0.2–1.1)
CO2: 30 mmol/L (ref 21–32)
Calcium: 9 MG/DL (ref 8.3–10.4)
Chloride: 105 mmol/L (ref 98–107)
Creatinine: 1.2 MG/DL — ABNORMAL HIGH (ref 0.6–1.0)
GFR est AA: 55 mL/min/{1.73_m2} — ABNORMAL LOW (ref >60)
GFR est non-AA: 46 mL/min/{1.73_m2} — ABNORMAL LOW (ref >60)
Globulin: 3.5 g/dL (ref 2.3–3.5)
Glucose: 99 mg/dL (ref 65–100)
Potassium: 4.5 mmol/L (ref 3.5–5.1)
Protein, total: 7 g/dL (ref 6.3–8.2)
Sodium: 140 mmol/L (ref 136–145)
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
## 2013-02-18 NOTE — ED Notes (Signed)
Back from Radiology at this time.

## 2013-02-18 NOTE — ED Notes (Signed)
The patient was given their discharge instructions and  was not given prescriptions.   The  patient verbalized understanding and had no additional questions. The patient was alert and was discharged via Ambulatory, without additional complaints at time of discharge.  No apparent distress noted
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
## 2013-03-25 ENCOUNTER — Encounter

## 2013-03-25 NOTE — Progress Notes (Signed)
bmd only,cv

## 2013-03-26 NOTE — Progress Notes (Signed)
[name]  Evans Levee C. Allena KatzPatel, M.D.  Deloris PingSiddesha Arashinagundi, M.D.  [address]  [place] [id number]  Ph No:  [phone]  Fax:  [phone]      Office Note      Patient: Natalie Moon  MRN:  [id number]  Date: 03/26/2013        Bone density was performed on 03/25/2013. Patient's  Dual femur score is  -0.3  AP spine  -0.2.  Bone density reportedly normal   She  is    advised to stay with the calcium, vitamin D supplement and exercise program.  Please see scanned copy of this document.        Natalie KernSudhirkumar C Artyom Stencel, MD

## 2013-05-03 MED ORDER — PRAVASTATIN 40 MG TAB
40 mg | ORAL_TABLET | Freq: Every evening | ORAL | Status: DC
Start: 2013-05-03 — End: 2014-05-28

## 2013-05-03 MED ORDER — TEMAZEPAM 15 MG CAP
15 mg | ORAL_CAPSULE | Freq: Every evening | ORAL | Status: DC | PRN
Start: 2013-05-03 — End: 2013-05-03

## 2013-05-03 MED ORDER — CLOPIDOGREL 75 MG TAB
75 mg | ORAL_TABLET | Freq: Every day | ORAL | Status: DC
Start: 2013-05-03 — End: 2014-05-28

## 2013-05-03 MED ORDER — ESOMEPRAZOLE MAGNESIUM 40 MG CAP, DELAYED RELEASE
40 mg | ORAL_CAPSULE | Freq: Every day | ORAL | Status: DC
Start: 2013-05-03 — End: 2014-03-12

## 2013-05-03 MED ORDER — VALSARTAN 160 MG TAB
160 mg | ORAL_TABLET | Freq: Every day | ORAL | Status: DC
Start: 2013-05-03 — End: 2014-02-07

## 2013-05-03 MED ORDER — CELECOXIB 200 MG CAP
200 mg | ORAL_CAPSULE | ORAL | Status: DC
Start: 2013-05-03 — End: 2013-07-11

## 2013-05-03 MED ORDER — TEMAZEPAM 15 MG CAP
15 mg | ORAL_CAPSULE | Freq: Every evening | ORAL | Status: DC | PRN
Start: 2013-05-03 — End: 2013-07-11

## 2013-05-03 MED ORDER — CITALOPRAM 20 MG TAB
20 mg | ORAL_TABLET | Freq: Every day | ORAL | Status: DC
Start: 2013-05-03 — End: 2013-07-11

## 2013-05-03 MED ORDER — LEVOTHYROXINE 50 MCG TAB
50 mcg | ORAL_TABLET | ORAL | Status: DC
Start: 2013-05-03 — End: 2013-08-09
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
## 2013-05-08 LAB — AMB POC URINALYSIS DIP STICK AUTO W/O MICRO
Bilirubin (UA POC): NEGATIVE
Blood (UA POC): NEGATIVE
Glucose (UA POC): NEGATIVE mg/dL
Ketones (UA POC): NEGATIVE
Nitrites (UA POC): NEGATIVE
Protein (UA POC): 30
Protein (UA POC): NORMAL mg/dL
Protein Total Urine: NEGATIVE
Protein-Creat Ratio: 300 mg/g
Specific gravity (UA POC): 1.025 (ref 1.001–1.035)
Urobilinogen (UA POC): 0.2 (ref 0.2–1)
pH (UA POC): 6.5 (ref 4.6–8.0)

## 2013-05-08 LAB — AMB POC COMPLETE CBC,AUTOMATED ENTER
ABS. GRANS (POC): 2.9 10*3/uL (ref 1.4–6.5)
ABS. LYMPHS (POC): 1.1 10*3/uL — AB (ref 1.2–3.4)
ABS. MONOS (POC): 0.3 10*3/uL (ref 0.1–0.6)
GRANULOCYTES (POC): 67.3 % (ref 42.2–75.2)
HCT (POC): 35.4 % (ref 35–60)
HGB (POC): 11.5 g/dL (ref 11–18)
LYMPHOCYTES (POC): 24.8 % (ref 20.5–51.1)
MCH (POC): 30.5 pg (ref 27–31)
MCHC (POC): 32.5 g/dL — AB (ref 33–37)
MCV (POC): 94.1 fL (ref 80–99.9)
MONOCYTES (POC): 7.9 % (ref 1.7–9.3)
MPV (POC): 8.7 fL (ref 7.8–11)
PLATELET (POC): 205 10*3/uL (ref 150–450)
RBC (POC): 3.76 10*6/uL — AB (ref 4–6)
RDW (POC): 13.9 % — AB (ref 11.6–13.7)
WBC (POC): 4.3 10*3/uL — AB (ref 4.5–10.5)

## 2013-05-09 LAB — LIPID PANEL
Cholesterol, total: 195 mg/dL (ref 100–199)
HDL Cholesterol: 81 mg/dL (ref >39)
LDL, calculated: 87 mg/dL (ref 0–99)
Triglyceride: 136 mg/dL (ref 0–149)
VLDL, calculated: 27 mg/dL (ref 5–40)

## 2013-05-09 LAB — METABOLIC PANEL, COMPREHENSIVE
A-G Ratio: 1.5 (ref 1.1–2.5)
ALT (SGPT): 8 IU/L (ref 0–32)
AST (SGOT): 18 IU/L (ref 0–40)
Albumin: 4.1 g/dL (ref 3.5–4.7)
Alk. phosphatase: 89 IU/L (ref 39–117)
BUN/Creatinine ratio: 18 (ref 11–26)
BUN: 20 mg/dL (ref 8–27)
Bilirubin, total: 0.3 mg/dL (ref 0.0–1.2)
CO2: 28 mmol/L (ref 18–29)
Calcium: 9.3 mg/dL (ref 8.7–10.3)
Chloride: 103 mmol/L (ref 97–108)
Creatinine: 1.12 mg/dL — ABNORMAL HIGH (ref 0.57–1.00)
GFR est AA: 53 mL/min/{1.73_m2} — ABNORMAL LOW (ref >59)
GFR est non-AA: 46 mL/min/{1.73_m2} — ABNORMAL LOW (ref >59)
GLOBULIN, TOTAL: 2.8 g/dL (ref 1.5–4.5)
Glucose: 105 mg/dL — ABNORMAL HIGH (ref 65–99)
Potassium: 4.6 mmol/L (ref 3.5–5.2)
Protein, total: 6.9 g/dL (ref 6.0–8.5)
Sodium: 142 mmol/L (ref 134–144)

## 2013-05-09 LAB — T4 (THYROXINE): T4, Total: 4.8 ug/dL (ref 4.5–12.0)

## 2013-05-09 LAB — TSH 3RD GENERATION: TSH: 2.48 u[IU]/mL (ref 0.450–4.500)

## 2013-07-11 MED ORDER — TEMAZEPAM 15 MG CAP
15 mg | ORAL_CAPSULE | Freq: Every evening | ORAL | Status: DC | PRN
Start: 2013-07-11 — End: 2013-08-01

## 2013-07-11 MED ORDER — DULOXETINE 30 MG CAP, DELAYED RELEASE
30 mg | ORAL_CAPSULE | Freq: Every day | ORAL | Status: DC
Start: 2013-07-11 — End: 2013-07-12

## 2013-07-11 MED ORDER — CELECOXIB 200 MG CAP
200 mg | ORAL_CAPSULE | ORAL | Status: DC
Start: 2013-07-11 — End: 2013-10-11
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
## 2013-07-12 MED ORDER — DULOXETINE 30 MG CAP, DELAYED RELEASE
30 mg | ORAL_CAPSULE | Freq: Every day | ORAL | Status: DC
Start: 2013-07-12 — End: 2013-08-09

## 2013-08-01 MED ORDER — TEMAZEPAM 15 MG CAP
15 mg | ORAL_CAPSULE | ORAL | Status: DC
Start: 2013-08-01 — End: 2013-10-11

## 2013-08-01 NOTE — Telephone Encounter (Signed)
Orders Placed This Encounter   ??? temazepam (RESTORIL) 15 mg capsule     Sig: TAKE ONE CAPSULE BY MOUTH EVERY NIGHT AS NEEDED FOR SLEEP     Dispense:  30 Cap     Refill:  2     Not to exceed 3 additional fills before 10/30/2013     Per dr

## 2013-08-09 MED ORDER — DULOXETINE 60 MG CAP, DELAYED RELEASE
60 mg | ORAL_CAPSULE | Freq: Every day | ORAL | Status: DC
Start: 2013-08-09 — End: 2014-02-07

## 2013-08-09 MED ORDER — LEVOTHYROXINE 50 MCG TAB
50 mcg | ORAL_TABLET | ORAL | Status: DC
Start: 2013-08-09 — End: 2013-10-11

## 2013-08-09 NOTE — Progress Notes (Signed)
[name]  Yerlin Gasparyan C. Posey Pronto, M.D.  [name], M.D.  [place], [place]  Ph No:  [phone]  Fax:  [phone]      CHIEF COMPLAINT  Follow-up visit      HISTORY OF PRESENT ILLNESS: Ms. Kangas is a 80 y.o. female with past medical history hypertension and DJD, depression, anxiety.  She is seen in office today stating that she is worried about her son's illness.  She also had some joint pain.  She was given Celebrex as well as Cymbalta, but she says Cymbalta has not helped her.  She would like to increase the dose of medication to see if it helps her control her depression.  She is taking Celexa.  That has not been working very well.  No chest pain, no nausea, no vomiting.        Past Medical History   Diagnosis Date   ??? Hypertension    ??? COPD    ??? Arrhythmia    ??? GERD (gastroesophageal reflux disease)    ??? CAD (coronary artery disease) 04/20/2010   ??? PUD (peptic ulcer disease)    ??? Thyroid disease    ??? Cancer ([place])      colon cancer with resection   ??? Psychiatric disorder      anxiety and depression   ??? Anemia 11/14/2011   ??? DJD (degenerative joint disease) 11/14/2011   ??? Depression      Family History   Problem Relation Age of Onset   ??? Heart Disease Mother    ??? Stroke Father    ??? Bleeding Prob Maternal Aunt    ??? Heart Disease Maternal Aunt    ??? Breast Cancer Maternal Aunt    ??? Bleeding Prob Maternal Uncle    ??? Heart Disease Maternal Uncle    ??? Heart Disease Maternal Grandmother    ??? Heart Disease Maternal Grandfather    ??? Breast Cancer Sister 33   ??? Breast Cancer Child 69   ??? Breast Cancer Paternal Aunt    ??? Breast Cancer Other 34     her son   ??? Breast Cancer Maternal Aunt    ??? Breast Cancer Maternal Aunt    ??? Breast Cancer Paternal Aunt    ??? Breast Cancer Paternal Aunt      History     Social History   ??? Marital Status: WIDOWED     Spouse Name: N/A     Number of Children: N/A   ??? Years of Education: N/A      Social History Main Topics   ??? Smoking status: Never Smoker    ??? Smokeless tobacco: Not on file   ??? Alcohol Use: No   ??? Drug Use: No   ??? Sexual Activity: Not on file     Other Topics Concern   ??? Not on file     Social History Narrative         MEDICATIONS: Current outpatient prescriptions: DULoxetine (CYMBALTA) 60 mg capsule, Take 1 Cap by mouth daily., Disp: 90 Cap, Rfl: 2;  levothyroxine (SYNTHROID) 50 mcg tablet, TAKE 1 TABLET BY MOUTH EVERY DAY, Disp: 90 Tab, Rfl: 3;  temazepam (RESTORIL) 15 mg capsule, TAKE ONE CAPSULE BY MOUTH EVERY NIGHT AS NEEDED FOR SLEEP, Disp: 30 Cap, Rfl: 2;  celecoxib (CELEBREX) 200 mg capsule, TAKE ONE CAPSULE BY MOUTH EVERY DAY, Disp: 90 Cap, Rfl: 3  valsartan (DIOVAN) 160 mg tablet, Take  1 Tab by mouth daily., Disp: 90 Tab, Rfl: 3;  esomeprazole (NEXIUM) 40 mg capsule, Take 1 Cap by mouth daily., Disp: 90 Cap, Rfl: 3;  clopidogrel (PLAVIX) 75 mg tablet, Take 1 Tab by mouth daily., Disp: 30 Tab, Rfl: 11;  nitroglycerin (NITROSTAT) 0.4 mg SL tablet, 1 Tab by SubLINGual route every five (5) minutes as needed for Chest Pain., Disp: 100 Tab, Rfl: prn  pravastatin (PRAVACHOL) 40 mg tablet, Take 1 Tab by mouth nightly., Disp: 90 Tab, Rfl: 3      REVIEW OF SYSTEMS:  GENERAL: Negative weakness, negative fatigue, native malaise, negative chills, negative fever, negative night sweats, negative allergies.  INTEGUMENTARY: Negative rash, negative jaundice.  HEMATOPOIETIC: Negative bleeding, negative lymph node enlargement, negative bruisability.  NEUROLOGIC: Negative headaches, negative syncope, negative seizures, negative weakness, negative tremor. No history of strokes, no history of other neurologic conditions.  EYES: Negative visual changes, negative diplopia, negative scotomata, negative impaired vision.  EARS: Negative tinnitus, negative vertigo, negative hearing impairment.  NOSE AND THROAT: Negative postnasal drip, negative sore throat.   CARDIOVASCULAR: Negative chest pain, negative dyspnea on exertion, negative palpations, negative edema. No history of heart attack, no history of arrhythmias.  RESPIRATORY: No history of shortness of breath, no history of asthma, no history of chronic obstructive pulmonary disease, no history of obstructive sleep apnea.  GASTROINTESTINAL: Negative dysphagia, negative nausea, negative vomiting, negative hematemesis, negative abdominal pain.  GENITOURINARY: Negative frequency, negative urgency, negative dysuria, negative incontinence. No history of STDs.   MUSCULOSKELETAL: Negative myalgia, negative joint pain, negative stiffness, negative weakness, negative back pain.  PSYCHIATRIC: No history of psychiatric disorder.  ENDOCRINE: No history of alopecia, palpitations, sweats, dry skin, muscle weakness, fatigue.        PHYSICAL EXAM  Vitals - BP 119/65 mmHg   Pulse 80   Ht _0  (1.651 m)   Wt 180 lb (81.647 kg)   BMI 29.95 kg/m2   General appearance - alert, well appearing, and in no distress  Mental status - alert, oriented to person, place, and time  Eyes - pupils equal and reactive, extraocular eye movements intact  Nose - normal and patent, no erythema, discharge or polyps  Mouth - mucous membranes moist, pharynx normal without lesions  Neck - supple, no significant adenopathy  Chest - clear to auscultation, no wheezes, rales or rhonchi, symmetric air entry  Heart - normal rate, regular rhythm, normal S1, S2,systolic murmur   Abdomen - soft, nontender, liver and spleen nonpalpable  Back exam - range of motion, limited secondary to DJD  Neurological - alert, oriented, normal speech, no focal findings or movement disorder noted  Musculoskeletal - DJD changes noted  Extremities - no edema noted  Skin - normal coloration and turgor, no rashes, no suspicious skin lesions noted    LABS   Results for orders placed or performed in visit on 05/08/13   LIPID PANEL   Result Value Ref Range     Cholesterol, total 195 100 - 199 mg/dL    Triglyceride 136 0 - 149 mg/dL    HDL Cholesterol 81 >39 mg/dL    VLDL, calculated 27 5 - 40 mg/dL    LDL, calculated 87 0 - 99 mg/dL   METABOLIC PANEL, COMPREHENSIVE   Result Value Ref Range    Glucose 105 (H) 65 - 99 mg/dL    BUN 20 8 - 27 mg/dL    Creatinine 1.12 (H) 0.57 - 1.00 mg/dL    GFR est non-AA 46 (L) >59  mL/min/1.73    GFR est AA 53 (L) >59 mL/min/1.73    BUN/Creatinine ratio 18 11 - 26    Sodium 142 134 - 144 mmol/L    Potassium 4.6 3.5 - 5.2 mmol/L    Chloride 103 97 - 108 mmol/L    CO2 28 18 - 29 mmol/L    Calcium 9.3 8.7 - 10.3 mg/dL    Protein, total 6.9 6.0 - 8.5 g/dL    Albumin 4.1 3.5 - 4.7 g/dL    GLOBULIN, TOTAL 2.8 1.5 - 4.5 g/dL    A-G Ratio 1.5 1.1 - 2.5    Bilirubin, total 0.3 0.0 - 1.2 mg/dL    Alk. phosphatase 89 39 - 117 IU/L    AST 18 0 - 40 IU/L    ALT 8 0 - 32 IU/L   T4   Result Value Ref Range    T4 4.8 4.5 - 12.0 ug/dL   TSH, 3RD GENERATION   Result Value Ref Range    TSH 2.480 0.450 - 4.500 uIU/mL   AMB POC COMPLETE CBC,AUTOMATED ENTER   Result Value Ref Range    WBC (POC) 4.3 (A) 4.5 - 10.5 10^3/ul    LYMPHOCYTES (POC) 24.8 20.5 - 51.1 %    MONOCYTES (POC) 7.9 1.7 - 9.3 %    GRANULOCYTES (POC) 67.3 42.2 - 75.2 %    ABS. LYMPHS (POC) 1.1 (A) 1.2 - 3.4 10^3/ul    ABS. MONOS (POC) 0.3 0.1 - 0.6 10^3/ul    ABS. GRANS (POC) 2.9 1.4 - 6.5 10^3/ul    RBC (POC) 3.76 (A) 4 - 6 10^6/ul    HGB (POC) 11.5 11 - 18 g/dL    HCT (POC) 35.4 35 - 60 %    MCV (POC) 94.1 80 - 99.9 fL    MCH (POC) 30.5 27 - 31 pg    MCHC (POC) 32.5 (A) 33 - 37 g/dL    RDW (POC) 13.9 (A) 11.6 - 13.7 %    PLATELET (POC) 205 150 - 450 10^3/ul    MPV (POC) 8.7 7.8 - 11 fL   AMB POC URINALYSIS DIP STICK AUTO W/O MICRO (CIM)   Result Value Ref Range    Color (UA POC) Yellow     Clarity (UA POC) Clear     Glucose (UA POC) Negative Negative mg/dL    Bilirubin (UA POC) Negative Negative    Ketones (UA POC) Negative Negative    Specific gravity (UA POC) 1.025 1.001 - 1.035     Blood (UA POC) Negative Negative    pH (UA POC) 6.5 4.6 - 8.0    Protein (UA POC) 30  Negative    Urobilinogen (UA POC) 0.2 mg/dL 0.2 - 1    Nitrites (UA POC) Negative Negative    Leukocyte esterase (UA POC) Trace Negative    Protein Total Urine negative     Protein-Creat Ratio 300 mg/g    Protein (UA POC) normal Negative mg/dL            IMPRESSION     ICD-9-CM    1. Essential hypertension 401.9    2. Unspecified hypothyroidism 244.9    3. Depression 311    4. Dyslipidemia 272.4    5. Gastroesophageal reflux disease without esophagitis 530.81          PLAN : I discussed with patient.  I have started her on Cymbalta during last visit, she was taking 30 mg daily.  I have increased the dose of  Cymbalta 60 mg daily.  She is advised to stop her Celexa.  She is also advised to monitor her blood pressure reading.  She will continue the diet and exercise program.  She orders and medication list.  We need to have better control her blood pressure reading.  She will continue with thyroid medication.  Continue with her Synthroid therapy.        Zenovia Jarred, MD          Dictated using voice recognition software. Proofread, but unrecognized voice recognition errors may exist.

## 2013-10-11 LAB — AMB POC COMPLETE CBC,AUTOMATED ENTER
ABS. GRANS (POC): 3.4 10*3/uL (ref 1.4–6.5)
ABS. LYMPHS (POC): 0.7 10*3/uL — AB (ref 1.2–3.4)
ABS. MONOS (POC): 0.2 10*3/uL (ref 0.1–0.6)
GRANULOCYTES (POC): 78.6 % — AB (ref 42.2–75.2)
HCT (POC): 34.4 % — AB (ref 35–60)
HGB (POC): 11.2 g/dL (ref 11–18)
LYMPHOCYTES (POC): 15.9 % — AB (ref 20.5–51.1)
MCH (POC): 30 pg (ref 27–31)
MCHC (POC): 32.4 g/dL — AB (ref 33–37)
MCV (POC): 92.5 fL (ref 80–99.9)
MONOCYTES (POC): 5.5 % (ref 1.7–9.3)
MPV (POC): 8.2 fL (ref 7.8–11)
PLATELET (POC): 204 10*3/uL (ref 150–450)
RBC (POC): 3.72 10*6/uL — AB (ref 4–6)
RDW (POC): 14.3 % — AB (ref 11.6–13.7)
WBC (POC): 4.3 10*3/uL — AB (ref 4.5–10.5)

## 2013-10-11 MED ORDER — TEMAZEPAM 15 MG CAP
15 mg | ORAL_CAPSULE | ORAL | Status: DC
Start: 2013-10-11 — End: 2014-02-07

## 2013-10-11 MED ORDER — LEVOTHYROXINE 50 MCG TAB
50 mcg | ORAL_TABLET | ORAL | Status: DC
Start: 2013-10-11 — End: 2014-06-26

## 2013-10-11 MED ORDER — CELECOXIB 200 MG CAP
200 mg | ORAL_CAPSULE | ORAL | Status: DC
Start: 2013-10-11 — End: 2014-06-26
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
## 2013-10-12 LAB — METABOLIC PANEL, COMPREHENSIVE
A-G Ratio: 1.6 (ref 1.1–2.5)
ALT (SGPT): 8 IU/L (ref 0–32)
AST (SGOT): 18 IU/L (ref 0–40)
Albumin: 4.2 g/dL (ref 3.5–4.7)
Alk. phosphatase: 90 IU/L (ref 39–117)
BUN/Creatinine ratio: 11 (ref 11–26)
BUN: 13 mg/dL (ref 8–27)
Bilirubin, total: 0.2 mg/dL (ref 0.0–1.2)
CO2: 25 mmol/L (ref 18–29)
Calcium: 9.2 mg/dL (ref 8.7–10.3)
Chloride: 101 mmol/L (ref 97–108)
Creatinine: 1.18 mg/dL — ABNORMAL HIGH (ref 0.57–1.00)
GFR est AA: 50 mL/min/{1.73_m2} — ABNORMAL LOW (ref >59)
GFR est non-AA: 43 mL/min/{1.73_m2} — ABNORMAL LOW (ref >59)
GLOBULIN, TOTAL: 2.7 g/dL (ref 1.5–4.5)
Glucose: 71 mg/dL (ref 65–99)
Potassium: 4.1 mmol/L (ref 3.5–5.2)
Protein, total: 6.9 g/dL (ref 6.0–8.5)
Sodium: 142 mmol/L (ref 134–144)

## 2013-10-12 LAB — SED RATE (ESR): Sed rate (ESR): 23 mm/hr (ref 0–40)

## 2013-10-12 LAB — LIPID PANEL
Cholesterol, total: 190 mg/dL (ref 100–199)
HDL Cholesterol: 88 mg/dL (ref >39)
LDL, calculated: 78 mg/dL (ref 0–99)
Triglyceride: 121 mg/dL (ref 0–149)
VLDL, calculated: 24 mg/dL (ref 5–40)

## 2013-10-12 LAB — TSH 3RD GENERATION: TSH: 1.7 u[IU]/mL (ref 0.450–4.500)

## 2013-10-12 LAB — T4 (THYROXINE): T4, Total: 5.9 ug/dL (ref 4.5–12.0)

## 2013-10-12 LAB — T3, FREE: Triiodothyronine (T3), free: 2.2 pg/mL (ref 2.0–4.4)

## 2013-10-14 LAB — AMB POC URINALYSIS DIP STICK AUTO W/O MICRO
Bilirubin (UA POC): NEGATIVE
Blood (UA POC): NEGATIVE
Glucose (UA POC): NEGATIVE mg/dL
Ketones (UA POC): NEGATIVE
Nitrites (UA POC): NEGATIVE
Protein (UA POC): NEGATIVE
Specific gravity (UA POC): 1.01 (ref 1.001–1.035)
Urobilinogen (UA POC): 0.2 (ref 0.2–1)
pH (UA POC): 5 (ref 4.6–8.0)

## 2013-10-28 MED ORDER — LEVOTHYROXINE 50 MCG TAB
50 mcg | ORAL_TABLET | ORAL | Status: DC
Start: 2013-10-28 — End: 2013-11-01

## 2013-11-01 ENCOUNTER — Ambulatory Visit: Admit: 2013-11-01 | Discharge: 2013-11-01 | Payer: MEDICARE | Attending: Specialist | Primary: Specialist

## 2013-11-01 DIAGNOSIS — E785 Hyperlipidemia, unspecified: Secondary | ICD-10-CM

## 2013-11-01 NOTE — Progress Notes (Signed)
Are you allergic to eggs? NO    Have you ever had a serious reaction to any vaccine in the past? NO    Allergies on file:  No Known Allergies    Was the patient given a printed handout on the flu vaccine? YES
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
## 2013-12-24 ENCOUNTER — Inpatient Hospital Stay: Admit: 2013-12-24 | Payer: MEDICARE | Primary: Specialist

## 2013-12-24 DIAGNOSIS — R0600 Dyspnea, unspecified: Secondary | ICD-10-CM

## 2013-12-24 LAB — CBC WITH AUTOMATED DIFF
ABS. BASOPHILS: 0 10*3/uL (ref 0.0–0.2)
ABS. EOSINOPHILS: 0.1 10*3/uL (ref 0.0–0.8)
ABS. LYMPHOCYTES: 1 10*3/uL (ref 0.5–4.6)
ABS. MONOCYTES: 0.4 10*3/uL (ref 0.1–1.3)
ABS. NEUTROPHILS: 3.4 10*3/uL (ref 1.7–8.2)
BASOPHILS: 0 % (ref 0.0–2.0)
EOSINOPHILS: 2 % (ref 0.5–7.8)
HCT: 35.1 % — ABNORMAL LOW (ref 35.8–46.3)
HGB: 11.1 g/dL — ABNORMAL LOW (ref 11.7–15.4)
LYMPHOCYTES: 20 % (ref 13–44)
MCH: 30.2 PG (ref 26.1–32.9)
MCHC: 31.6 g/dL (ref 31.4–35.0)
MCV: 95.6 FL (ref 79.6–97.8)
MONOCYTES: 9 % (ref 4.0–12.0)
MPV: 10.4 FL — ABNORMAL LOW (ref 10.8–14.1)
NEUTROPHILS: 69 % (ref 43–78)
PLATELET: 224 10*3/uL (ref 150–450)
RBC: 3.67 M/uL — ABNORMAL LOW (ref 4.05–5.25)
RDW: 14.4 % (ref 11.9–14.6)
WBC: 4.9 10*3/uL (ref 4.3–11.1)

## 2013-12-24 LAB — METABOLIC PANEL, BASIC
Anion gap: 4 mmol/L — ABNORMAL LOW (ref 7–16)
BUN: 20 MG/DL (ref 8–23)
CO2: 30 mmol/L (ref 21–32)
Calcium: 8.5 MG/DL (ref 8.3–10.4)
Chloride: 104 mmol/L (ref 98–107)
Creatinine: 1.21 MG/DL (ref 0.6–1.5)
GFR est AA: 55 mL/min/{1.73_m2} — ABNORMAL LOW (ref >60)
GFR est non-AA: 45 mL/min/{1.73_m2} — ABNORMAL LOW (ref >60)
Glucose: 106 mg/dL — ABNORMAL HIGH (ref 65–100)
Potassium: 4.1 mmol/L (ref 3.5–5.1)
Sodium: 138 mmol/L (ref 136–145)

## 2013-12-24 LAB — BNP: BNP: 20 pg/mL

## 2014-01-03 NOTE — Progress Notes (Signed)
Patient pre-assessment complete for heart cath/poss scheduled for 01/06/14 arrival time 0800 for hydration. Patient verified with dob.  Patient instructed to bring all home meds with her the day of procedure in their labeled containers. NPO status reinforced. Patient instructed to take plavix, valsartan, cymbalta, and levothyroxine the morning of her procedure. Patient verbalizes understanding of all instructions. Patient also given contact info for the Cath Lab should they have any further questions. Pt has no other questions at this time.

## 2014-01-06 ENCOUNTER — Inpatient Hospital Stay: Admit: 2014-01-06 | Payer: MEDICARE | Attending: Cardiovascular Disease | Primary: Specialist

## 2014-01-06 DIAGNOSIS — I2510 Atherosclerotic heart disease of native coronary artery without angina pectoris: Secondary | ICD-10-CM

## 2014-01-06 LAB — METABOLIC PANEL, COMPREHENSIVE
A-G Ratio: 0.9 — ABNORMAL LOW (ref 1.2–3.5)
ALT (SGPT): 25 U/L (ref 12–65)
AST (SGOT): 27 U/L (ref 15–37)
Albumin: 3.6 g/dL (ref 3.2–4.6)
Alk. phosphatase: 140 U/L — ABNORMAL HIGH (ref 50–136)
Anion gap: 6 mmol/L — ABNORMAL LOW (ref 7–16)
BUN: 17 MG/DL (ref 8–23)
Bilirubin, total: 0.4 MG/DL (ref 0.2–1.1)
CO2: 28 mmol/L (ref 21–32)
Calcium: 8.3 MG/DL (ref 8.3–10.4)
Chloride: 107 mmol/L (ref 98–107)
Creatinine: 1.19 MG/DL — ABNORMAL HIGH (ref 0.6–1.0)
GFR est AA: 56 mL/min/{1.73_m2} — ABNORMAL LOW (ref >60)
GFR est non-AA: 46 mL/min/{1.73_m2} — ABNORMAL LOW (ref >60)
Globulin: 4.2 g/dL — ABNORMAL HIGH (ref 2.3–3.5)
Glucose: 103 mg/dL — ABNORMAL HIGH (ref 65–100)
Potassium: 4.1 mmol/L (ref 3.5–5.1)
Protein, total: 7.8 g/dL (ref 6.3–8.2)
Sodium: 141 mmol/L (ref 136–145)

## 2014-01-06 LAB — CBC W/O DIFF
HCT: 35.5 % — ABNORMAL LOW (ref 35.8–46.3)
HGB: 11.3 g/dL — ABNORMAL LOW (ref 11.7–15.4)
MCH: 30.1 PG (ref 26.1–32.9)
MCHC: 31.8 g/dL (ref 31.4–35.0)
MCV: 94.7 FL (ref 79.6–97.8)
MPV: 10.4 FL — ABNORMAL LOW (ref 10.8–14.1)
PLATELET: 217 10*3/uL (ref 150–450)
RBC: 3.75 M/uL — ABNORMAL LOW (ref 4.05–5.25)
RDW: 14.5 % (ref 11.9–14.6)
WBC: 4.5 10*3/uL (ref 4.3–11.1)

## 2014-01-06 LAB — EKG, 12 LEAD, INITIAL
Atrial Rate: 74 {beats}/min
Calculated P Axis: 76 degrees
Calculated R Axis: 40 degrees
Calculated T Axis: 59 degrees
Diagnosis: NORMAL
P-R Interval: 148 ms
Q-T Interval: 386 ms
QRS Duration: 72 ms
QTC Calculation (Bezet): 428 ms
Ventricular Rate: 74 {beats}/min

## 2014-01-06 MED ORDER — SODIUM CHLORIDE 0.9 % IV
INTRAVENOUS | Status: DC
Start: 2014-01-06 — End: 2014-01-06
  Administered 2014-01-06: 14:00:00 via INTRAVENOUS

## 2014-01-06 MED ORDER — NITROGLYCERIN 0.2MG/ML SYRINGE
0.2 mg/mL | Freq: Once | INTRAMUSCULAR | Status: AC | PRN
Start: 2014-01-06 — End: 2014-01-06
  Administered 2014-01-06: 17:00:00 via INTRACORONARY

## 2014-01-06 MED ORDER — ASPIRIN 81 MG CHEWABLE TAB
81 mg | Freq: Once | ORAL | Status: AC
Start: 2014-01-06 — End: 2014-01-06
  Administered 2014-01-06: 14:00:00 via ORAL

## 2014-01-06 MED ORDER — NITROGLYCERIN 0.2MG/ML SYRINGE
0.2 mg/mL | INTRAMUSCULAR | Status: AC
Start: 2014-01-06 — End: ?

## 2014-01-06 MED ORDER — MIDAZOLAM 1 MG/ML IJ SOLN
1 mg/mL | Freq: Once | INTRAMUSCULAR | Status: DC | PRN
Start: 2014-01-06 — End: 2014-01-06
  Administered 2014-01-06 (×3): via INTRAVENOUS

## 2014-01-06 MED ORDER — SODIUM CHLORIDE 0.9 % IJ SYRG
INTRAMUSCULAR | Status: DC | PRN
Start: 2014-01-06 — End: 2014-01-06

## 2014-01-06 MED ORDER — IOPAMIDOL 76 % IV SOLN
370 mg iodine /mL (76 %) | Freq: Once | INTRAVENOUS | Status: AC
Start: 2014-01-06 — End: 2014-01-06
  Administered 2014-01-06: 17:00:00 via INTRAVENOUS

## 2014-01-06 MED ORDER — DIAZEPAM 5 MG TAB
5 mg | Freq: Once | ORAL | Status: AC
Start: 2014-01-06 — End: 2014-01-06
  Administered 2014-01-06: 14:00:00 via ORAL

## 2014-01-06 MED ORDER — FENTANYL CITRATE (PF) 50 MCG/ML IJ SOLN
50 mcg/mL | INTRAMUSCULAR | Status: AC
Start: 2014-01-06 — End: ?

## 2014-01-06 MED ORDER — FENTANYL CITRATE (PF) 50 MCG/ML IJ SOLN
50 mcg/mL | INTRAMUSCULAR | Status: DC | PRN
Start: 2014-01-06 — End: 2014-01-06
  Administered 2014-01-06: 17:00:00 via INTRAVENOUS

## 2014-01-06 MED ORDER — DIAZEPAM 5 MG TAB
5 mg | ORAL | Status: AC
Start: 2014-01-06 — End: ?

## 2014-01-06 MED ORDER — SODIUM CHLORIDE 0.9 % IJ SYRG
Freq: Three times a day (TID) | INTRAMUSCULAR | Status: DC
Start: 2014-01-06 — End: 2014-01-06

## 2014-01-06 MED ORDER — HEPARIN (PORCINE) 10,000 UNIT/ML IJ SOLN
10000 unit/mL | INTRAMUSCULAR | Status: AC
Start: 2014-01-06 — End: ?

## 2014-01-06 MED ORDER — NITROGLYCERIN 0.2MG/ML SYRINGE
0.2 mg/mL | Freq: Once | INTRAMUSCULAR | Status: AC
Start: 2014-01-06 — End: 2014-01-06
  Administered 2014-01-06: 17:00:00 via INTRA_ARTERIAL

## 2014-01-06 MED ORDER — HEPARIN (PORCINE) IN NS (PF) 2,000 UNIT/1,000 ML IV
2000 unit/1,000 mL | INTRAVENOUS | Status: DC
Start: 2014-01-06 — End: 2014-01-06
  Administered 2014-01-06: 16:00:00 via INTRA_ARTERIAL

## 2014-01-06 MED ORDER — HEPARIN (PORCINE) IN NS (PF) 2,000 UNIT/1,000 ML IV
2000 unit/1,000 mL | INTRAVENOUS | Status: AC
Start: 2014-01-06 — End: ?

## 2014-01-06 MED ORDER — ACETAMINOPHEN 325 MG TABLET
325 mg | ORAL | Status: DC | PRN
Start: 2014-01-06 — End: 2014-01-06

## 2014-01-06 MED ORDER — MIDAZOLAM 1 MG/ML IJ SOLN
1 mg/mL | INTRAMUSCULAR | Status: AC
Start: 2014-01-06 — End: ?

## 2014-01-06 MED ORDER — ASPIRIN 81 MG CHEWABLE TAB
81 mg | ORAL | Status: AC
Start: 2014-01-06 — End: ?

## 2014-01-06 MED ORDER — LIDOCAINE HCL 2 % (20 MG/ML) IJ SOLN
20 mg/mL (2 %) | INTRAMUSCULAR | Status: DC | PRN
Start: 2014-01-06 — End: 2014-01-06
  Administered 2014-01-06: 17:00:00 via INTRADERMAL

## 2014-01-06 MED ORDER — LIDOCAINE HCL 2 % (20 MG/ML) IJ SOLN
20 mg/mL (2 %) | INTRAMUSCULAR | Status: AC
Start: 2014-01-06 — End: ?

## 2014-01-06 MED ORDER — SODIUM CHLORIDE 0.9 % IV
INTRAVENOUS | Status: DC
Start: 2014-01-06 — End: 2014-01-06

## 2014-01-06 MED ORDER — HEPARIN (PORCINE) 10,000 UNIT/ML IJ SOLN
10000 unit/mL | Freq: Once | INTRAMUSCULAR | Status: AC
Start: 2014-01-06 — End: 2014-01-06
  Administered 2014-01-06: 17:00:00 via INTRAVENOUS

## 2014-01-06 MED ORDER — HYDROMORPHONE (PF) 1 MG/ML IJ SOLN
1 mg/mL | INTRAMUSCULAR | Status: DC | PRN
Start: 2014-01-06 — End: 2014-01-06

## 2014-01-06 MED FILL — XYLOCAINE 20 MG/ML (2 %) INJECTION SOLUTION: 20 mg/mL (2 %) | INTRAMUSCULAR | Qty: 20

## 2014-01-06 MED FILL — ASPIRIN 81 MG CHEWABLE TAB: 81 mg | ORAL | Qty: 4

## 2014-01-06 MED FILL — HEPARIN (PORCINE) IN NS (PF) 2,000 UNIT/1,000 ML IV: 2000 unit/1,000 mL | INTRAVENOUS | Qty: 1000

## 2014-01-06 MED FILL — NITROGLYCERIN 0.2MG/ML SYRINGE: 0.2 mg/mL | INTRAMUSCULAR | Qty: 4

## 2014-01-06 MED FILL — MIDAZOLAM 1 MG/ML IJ SOLN: 1 mg/mL | INTRAMUSCULAR | Qty: 2

## 2014-01-06 MED FILL — FENTANYL CITRATE (PF) 50 MCG/ML IJ SOLN: 50 mcg/mL | INTRAMUSCULAR | Qty: 2

## 2014-01-06 MED FILL — DIAZEPAM 5 MG TAB: 5 mg | ORAL | Qty: 1

## 2014-01-06 MED FILL — HEPARIN (PORCINE) 10,000 UNIT/ML IJ SOLN: 10000 unit/mL | INTRAMUSCULAR | Qty: 1

## 2014-01-06 NOTE — Procedures (Signed)
Brief Cardiac Procedure Note    Patient: Natalie Moon MRN: [id number]  SSN: [id number]    Date of Birth: 09/21/1929  Age: 80 y.o.  Sex: female      Date of Procedure: 01/06/2014     Pre-procedure Diagnosis: Coronary Artery Disease    Post-procedure Diagnosis: Coronary Artery Disease    Procedure: Left Heart Catheterization    -IFR   RCA     Brief Description of Procedure: LV and Cors   IFR Verada wire   RCA  ostial; and mid     Performed By: Tyshea Imel E Shaquille Murdy, MD     Assistants: no    Anesthesia: Moderate Sedation    Estimated Blood Loss: Less than 10 mL      Specimens: None    Implants: None    Findings: LV nl     LAD 30% proximal and 30% midvessel   RCA ostial 50% instent      40-50% mid        Verada .96 proximal   and   .94 mid         Complications: None    Recommendations: Continue medical therapy.    Signed By: Dashae Wilcher E Zoltan Genest, MD     January 06, 2014

## 2014-01-06 NOTE — Procedures (Signed)
[facility]                            [address]                           [place] [id number]                                [phone]               [facility]    NAME:  Natalie Moon, Natalie Moon                          MR:  [id number]  LOC:  CCL [id number]            SEX:  F               ACCT:  [id number]  DOB:  [birth year]            AGE:  80              PT:  O  ADMIT:  01/06/2014          DSCH:  01/06/2014     MSV:        DATE OF PROCEDURE: 01/06/2014    CLINICAL: This is an 80 year old female with a history of coronary  disease, coronary stenting. She has had a progressive pattern of  shortness of breath, weakness, and fatigue that has been severely  limiting, now disabling, also has been responsive to nitroglycerin. A  diagnostic catheterization has been recommended for further evaluation  and possibly a coronary intervention. Risks and options discussed.    PROCEDURE: Brought to the cardiac catheterization lab fasting. A right  radial approach was taken after demonstration of satisfactory Allen's  test. The right radial artery was cannulated using modified Seldinger  approach. Catheterization was performed with a 6-French Terumo sheath, a  multipurpose catheter, standard radial artery cocktail, additional 3000  units of heparin were administered for the iFR Verrata wire assessment.  This was in the right coronary artery. Contrast was Isovue 370. Catheters  were a multipurpose catheter, JR4 catheter and a 5-French Launcher right  guide. She tolerated the procedure well. At the conclusion, the radial  artery sheath was withdrawn. Local hemostasis obtained with a radial  artery compression band. She was returned to the recovery area in stable  and good condition. Distal perfusion intact.    HEMODYNAMICS: Central aortic pressure was 140/80. LV pressure was  140/12.     A left ventriculogram done in RAO 30-degree projection revealed normal  contractility, regional wall motion, and left ventricular ejection  fraction at 55%.    CORONARY ARTERIOGRAMS: The left main coronary was normal. There was  moderate calcification in the LAD. There was minor irregularity, less  than 20% stenosis proximally. The first diagonal was free of significant  disease. There was another 20% to 30% stenosis in the LAD which was a  satisfactory caliber after the diagonal. The LAD continued to the cardiac  apex free of significant disease.    The circumflex coronary artery was a large system a 20 large caliber  vessel, 20% to 30% ostial narrowing that was focal, then minor  irregularity in the trunk, very  large distal posterolateral branch and a  smaller continuation of the circumflex were free of significant disease.    The right coronary artery had evidence of proximal stenting. This was  ostial, there appeared to be some stent extruding from the ostium. There  was a 50% ostial narrowing, then diffuse irregularity in the mid stent,  30% stenosis. Distally there are minor irregularity in a smaller PDA type  system and also 40% stenosis in the smaller vessel which was codominant  with the circumflex.    In light of the findings, the patient's symptoms, right coronary was  further assessed with an iFR wire. This was provided with a 5-French  guide catheter, Launcher, and then ostial iFR was 0.96 and the distal iFR  after the mid vessel 30% to 50% lesion was also 0.94. This did not  suggest hemodynamic flow limitation.    IMPRESSION:  1. Coronary disease as described with a negative iFR evaluation of the  right coronary ostial stenting and mid vessel stenosis.  2. Normal left ventricular systolic function.    PLAN: Recommend risk factor modification and medical management.                Seabron SpatesJohn E Aleiyah Halpin, MD                This is an unverified document unless signed by physician.     TID:  [id number]                                      DT:  01/06/2014 04:23 P  JOB:  [id number]           DOC#:  [id number]           DD:  01/06/2014    cc:   Seabron SpatesJohn E Ashlee Bewley, MD        Stevie KernSudhirkumar C Patel, MD

## 2014-01-06 NOTE — Progress Notes (Signed)
Patient up to bedside, vital signs stable. Patient ambulated to bathroom without difficulty. Patient voided without difficulty.     [id number] Discharge instructions and home medications reviewed with patient. Time allowed for questions and answers.    1420  Peripheral IV site dc'd without difficulty with tip intact.     1433 Patient discharged to home with family.

## 2014-01-06 NOTE — Progress Notes (Signed)
Pt arrived, ambulated to room with no visible problems, planned LHC for Dr Cebe.  Consent signed, Procedure discussed with pt all questions answered voiced understanding.     Medications and history discussed with pt.    Pt prepped per orders

## 2014-01-06 NOTE — Progress Notes (Signed)
Report received from Rachel Lee Cath Lab RN. Procedural findings communicated. Intra procedural  medication administration reviewed. Progression of care discussed.     Patient received into [facility] [place] [facility] post sheath removal.     Access site without bleeding or swelling yes    Dressing dry and intact yes    Patient instructed to limit movement to right upper extremity    Routine post procedural vital signs and site assessment initiated yes

## 2014-01-06 NOTE — Other (Signed)
TRANSFER - OUT REPORT:    LHC  Dr. Daleen Snookebe  RRA access    Versed 4mg , fentanyl 50mcg, heparin 3000 units IV  iFR of RCA, negative.  Medical management  R band with 12ml air right wrist    Verbal report given to Angie RN(name) on Kelli ChurnDorothy M Heizer  being transferred to cpru(unit) for routine progression of care       Report consisted of patient???s Situation, Background, Assessment and   Recommendations(SBAR).     Information from the following report(s) Procedure Summary was reviewed with the receiving nurse.    Lines:   Peripheral IV 01/06/14 Right Antecubital (Active)       Peripheral IV 01/06/14 Left Antecubital (Active)        Opportunity for questions and clarification was provided.      Patient transported with:   [employer]

## 2014-01-06 NOTE — Procedures (Signed)
[facility]                            [address]                           [place], [place]. [id number]                                [phone]               [facility]    NAME:  Natalie Moon, Natalie Moon                          MR:  [id number]  LOC:  CCL [id number]            SEX:  F               ACCT:  [id number]  DOB:  1929-08-03            AGE:  80              PT:  O  ADMIT:  01/06/2014          DSCH:  01/06/2014     MSV:        DATE OF PROCEDURE: 01/06/2014    CLINICAL: This is an 80 year old female with a history of coronary  disease, coronary stenting. She has had a progressive pattern of  shortness of breath, weakness, and fatigue that has been severely  limiting, now disabling, also has been responsive to nitroglycerin. A  diagnostic catheterization has been recommended for further evaluation  and possibly a coronary intervention. Risks and options discussed.    PROCEDURE: Brought to the cardiac catheterization lab fasting. A right  radial approach was taken after demonstration of satisfactory Allen's  test. The right radial artery was cannulated using modified Seldinger  approach. Catheterization was performed with a 6-French Terumo sheath, a  multipurpose catheter, standard radial artery cocktail, additional 3000  units of heparin were administered for the iFR Verrata wire assessment.  This was in the right coronary artery. Contrast was Isovue 370. Catheters  were a multipurpose catheter, JR4 catheter and a 5-French Launcher right  guide. She tolerated the procedure well. At the conclusion, the radial  artery sheath was withdrawn. Local hemostasis obtained with a radial  artery compression band. She was returned to the recovery area in stable  and good condition. Distal perfusion intact.    HEMODYNAMICS: Central aortic pressure was 140/80. LV pressure was  140/12.    A left ventriculogram done in RAO 30-degree projection  revealed normal  contractility, regional wall motion, and left ventricular ejection  fraction at 55%.    CORONARY ARTERIOGRAMS: The left main coronary was normal. There was  moderate calcification in the LAD. There was minor irregularity, less  than 20% stenosis proximally. The first diagonal was free of significant  disease. There was another 20% to 30% stenosis in the LAD which was a  satisfactory caliber after the diagonal. The LAD continued to the cardiac  apex free of significant disease.    The circumflex coronary artery was a large system a 20 large caliber  vessel, 20% to 30% ostial narrowing that was focal, then minor  irregularity in the trunk, very  large distal posterolateral branch and a  smaller continuation of the circumflex were free of significant disease.    The right coronary artery had evidence of proximal stenting. This was  ostial, there appeared to be some stent extruding from the ostium. There  was a 50% ostial narrowing, then diffuse irregularity in the mid stent,  30% stenosis. Distally there are minor irregularity in a smaller PDA type  system and also 40% stenosis in the smaller vessel which was codominant  with the circumflex.    In light of the findings, the patient's symptoms, right coronary was  further assessed with an iFR wire. This was provided with a 5-French  guide catheter, Launcher, and then ostial iFR was 0.96 and the distal iFR  after the mid vessel 30% to 50% lesion was also 0.94. This did not  suggest hemodynamic flow limitation.    IMPRESSION:  1. Coronary disease as described with a negative iFR evaluation of the  right coronary ostial stenting and mid vessel stenosis.  2. Normal left ventricular systolic function.    PLAN: Recommend risk factor modification and medical management.                Seabron SpatesJohn E Keyshla Tunison, MD                This is an unverified document unless signed by physician.    TID:  [id number]                                      DT:  01/06/2014 04:23 P  JOB:  [id number]            DOC#:  [id number]           DD:  01/06/2014    cc:   Seabron SpatesJohn E Telisha Zawadzki, MD        Stevie KernSudhirkumar C Patel, MD

## 2014-01-06 NOTE — Procedures (Signed)
Brief Cardiac Procedure Note    Patient: Natalie BoysDorothy M Wey MRN: [id number]  SSN: [id number]    Date of Birth: 03/12/1929  Age: 80 y.o.  Sex: female      Date of Procedure: 01/06/2014     Pre-procedure Diagnosis: Coronary Artery Disease    Post-procedure Diagnosis: Coronary Artery Disease    Procedure: Left Heart Catheterization    -IFR   RCA     Brief Description of Procedure: LV and Cors   IFR Verada wire   RCA  ostial; and mid     Performed By: Seabron SpatesJOHN E Faviola Klare, MD     Assistants: no    Anesthesia: Moderate Sedation    Estimated Blood Loss: Less than 10 mL      Specimens: None    Implants: None    Findings: LV nl     LAD 30% proximal and 30% midvessel   RCA ostial 50% instent      40-50% mid        [place] .96 proximal   and   .94 mid         Complications: None    Recommendations: Continue medical therapy.    Signed By: Seabron SpatesJOHN E Ramsie Ostrander, MD     January 06, 2014

## 2014-02-07 ENCOUNTER — Ambulatory Visit: Admit: 2014-02-07 | Discharge: 2014-02-07 | Payer: MEDICARE | Attending: Specialist | Primary: Specialist

## 2014-02-07 DIAGNOSIS — R1013 Epigastric pain: Secondary | ICD-10-CM

## 2014-02-07 LAB — AMB POC URINALYSIS DIP STICK AUTO W/O MICRO
Bilirubin (UA POC): NEGATIVE
Blood (UA POC): NEGATIVE
Glucose (UA POC): NEGATIVE mg/dL
Ketones (UA POC): NEGATIVE
Nitrites (UA POC): NEGATIVE
Protein (UA POC): NEGATIVE
Specific gravity (UA POC): 1.02 (ref 1.001–1.035)
Urobilinogen (UA POC): 0.2 (ref 0.2–1)
pH (UA POC): 5.5 (ref 4.6–8.0)

## 2014-02-07 LAB — AMB POC COMPLETE CBC,AUTOMATED ENTER
ABS. GRANS (POC): 3.7 10*3/uL (ref 1.4–6.5)
ABS. LYMPHS (POC): 1 10*3/uL — AB (ref 1.2–3.4)
ABS. MONOS (POC): 0.4 10*3/uL (ref 0.1–0.6)
GRANULOCYTES (POC): 71.9 % (ref 42.2–75.2)
HCT (POC): 35.3 % (ref 35–60)
HGB (POC): 11.4 g/dL (ref 11–18)
LYMPHOCYTES (POC): 20.1 % — AB (ref 20.5–51.1)
MCH (POC): 29.8 pg (ref 27–31)
MCHC (POC): 32.4 g/dL — AB (ref 33–37)
MCV (POC): 91.9 fL (ref 80–99.9)
MONOCYTES (POC): 8 % (ref 1.7–9.3)
MPV (POC): 8.6 fL (ref 7.8–11)
PLATELET (POC): 217 10*3/uL (ref 150–450)
RBC (POC): 3.84 10*6/uL — AB (ref 4–6)
RDW (POC): 14.9 % — AB (ref 11.6–13.7)
WBC (POC): 5.1 10*3/uL (ref 4.5–10.5)

## 2014-02-07 LAB — AMB POC HELICOBACTER PYLORI: H. pylori (POC): POSITIVE

## 2014-02-07 MED ORDER — TEMAZEPAM 15 MG CAP
15 mg | ORAL_CAPSULE | ORAL | Status: DC
Start: 2014-02-07 — End: 2014-05-28

## 2014-02-07 MED ORDER — VALSARTAN 160 MG TAB
160 mg | ORAL_TABLET | Freq: Every day | ORAL | Status: DC
Start: 2014-02-07 — End: 2014-03-12

## 2014-02-07 MED ORDER — SUCRALFATE 1 GRAM TAB
1 gram | ORAL_TABLET | Freq: Four times a day (QID) | ORAL | Status: DC
Start: 2014-02-07 — End: 2014-12-05

## 2014-02-07 MED ORDER — DULOXETINE 60 MG CAP, DELAYED RELEASE
60 mg | ORAL_CAPSULE | Freq: Every day | ORAL | Status: DC
Start: 2014-02-07 — End: 2014-03-12

## 2014-02-07 NOTE — Progress Notes (Signed)
[name]  Jenai Scaletta C. Posey Pronto, M.D.  [name], M.D.  [place], [place]  Ph No:  [phone]  Fax:  [phone]      CHIEF COMPLAINT  Follow-up      HISTORY OF PRESENT ILLNESS: Ms. Oyster is a 81 y.o. female with past medical history COPD, hypertension, GERD, history of hypothyroidism, history of anxiety, depression.  She is seen in office today stating that she is here for checkup.  She is still worried about her son.  She also had a cardiac catheterization recently.  No chest pain, no nausea, no vomiting or diarrhea.  No palpitation.    She is also having pain in her epigastric area.  She says not getting better.  She has been taking her Nexium on a daily basis        Past Medical History   Diagnosis Date   ??? Hypertension    ??? COPD    ??? Arrhythmia    ??? GERD (gastroesophageal reflux disease)    ??? CAD (coronary artery disease) 04/20/2010   ??? PUD (peptic ulcer disease)    ??? Thyroid disease    ??? Cancer ([place])      colon cancer with resection   ??? Psychiatric disorder      anxiety and depression   ??? Anemia 11/14/2011   ??? DJD (degenerative joint disease) 11/14/2011   ??? Depression    ??? Asthma    ??? Seizures ([place])      Family History   Problem Relation Age of Onset   ??? Heart Disease Mother    ??? Stroke Father    ??? Bleeding Prob Maternal Aunt    ??? Heart Disease Maternal Aunt    ??? Breast Cancer Maternal Aunt    ??? Bleeding Prob Maternal Uncle    ??? Heart Disease Maternal Uncle    ??? Heart Disease Maternal Grandmother    ??? Heart Disease Maternal Grandfather    ??? Breast Cancer Sister 23   ??? Breast Cancer Child 65   ??? Breast Cancer Paternal Aunt    ??? Breast Cancer Other 23     her son   ??? Breast Cancer Maternal Aunt    ??? Breast Cancer Maternal Aunt    ??? Breast Cancer Paternal Aunt    ??? Breast Cancer Paternal Aunt      History     Social History   ??? Marital Status: WIDOWED     Spouse Name: N/A     Number of Children: N/A   ??? Years of Education: N/A      Social History Main Topics   ??? Smoking status: Never Smoker    ??? Smokeless tobacco: Not on file   ??? Alcohol Use: No   ??? Drug Use: No   ??? Sexual Activity: Not on file     Other Topics Concern   ??? Not on file     Social History Narrative         MEDICATIONS:   Current outpatient prescriptions:   ???  DULoxetine (CYMBALTA) 60 mg capsule, Take 1 Cap by mouth daily., Disp: 90 Cap, Rfl: 2  ???  valsartan (DIOVAN) 160 mg tablet, Take 1 Tab by mouth daily., Disp: 90 Tab, Rfl: 3  ???  temazepam (RESTORIL) 15 mg capsule, TAKE ONE CAPSULE BY MOUTH EVERY NIGHT AS NEEDED FOR SLEEP, Disp: 30 Cap, Rfl: 2  ???  levothyroxine (SYNTHROID) 50 mcg tablet, TAKE  1 TABLET BY MOUTH EVERY DAY, Disp: 90 Tab, Rfl: 3  ???  celecoxib (CELEBREX) 200 mg capsule, TAKE ONE CAPSULE BY MOUTH EVERY DAY, Disp: 90 Cap, Rfl: 3  ???  esomeprazole (NEXIUM) 40 mg capsule, Take 1 Cap by mouth daily., Disp: 90 Cap, Rfl: 3  ???  pravastatin (PRAVACHOL) 40 mg tablet, Take 1 Tab by mouth nightly., Disp: 90 Tab, Rfl: 3  ???  clopidogrel (PLAVIX) 75 mg tablet, Take 1 Tab by mouth daily., Disp: 30 Tab, Rfl: 11  ???  nitroglycerin (NITROSTAT) 0.4 mg SL tablet, 1 Tab by SubLINGual route every five (5) minutes as needed for Chest Pain., Disp: 100 Tab, Rfl: prn      REVIEW OF SYSTEMS:  GENERAL: Negative weakness, negative fatigue, native malaise, negative chills, negative fever, negative night sweats, negative allergies.  INTEGUMENTARY: Negative rash, negative jaundice.  HEMATOPOIETIC: Negative bleeding, negative lymph node enlargement, negative bruisability.  NEUROLOGIC: Negative headaches, negative syncope, negative seizures, negative weakness, negative tremor. No history of strokes, no history of other neurologic conditions.  EYES: Negative visual changes, negative diplopia, negative scotomata, negative impaired vision.  EARS: Negative tinnitus, negative vertigo, negative hearing impairment.  NOSE AND THROAT: Negative postnasal drip, negative sore throat.   CARDIOVASCULAR: Negative chest pain, negative dyspnea on exertion, negative palpations, negative edema. No history of heart attack, no history of arrhythmias.  RESPIRATORY: No history of shortness of breath, no history of asthma, no history of chronic obstructive pulmonary disease, no history of obstructive sleep apnea.  GASTROINTESTINAL: positive for epigastric pain  GENITOURINARY: Negative frequency, negative urgency, negative dysuria, negative incontinence. No history of STDs.   MUSCULOSKELETAL: Negative myalgia, negative joint pain, negative stiffness, negative weakness, negative back pain.  PSYCHIATRIC: No history of psychiatric disorder.  ENDOCRINE: No history of alopecia, palpitations, sweats, dry skin, muscle weakness, fatigue.        PHYSICAL EXAM  Vitals - BP 136/74 mmHg   Ht _0  (1.651 m)   Wt 180 lb (81.647 kg)   BMI 29.95 kg/m2   General appearance - alert, well appearing, and in no distress  Mental status - alert, oriented to person, place, and time  Eyes - pupils equal and reactive, extraocular eye movements intact  Nose - normal and patent, no erythema, discharge or polyps  Mouth - mucous membranes moist, pharynx normal without lesions  Neck - supple, no significant adenopathy  Chest - clear to auscultation, no wheezes, rales or rhonchi, symmetric air entry  Heart - normal rate, regular rhythm, normal S1, S2,systolic murmur   Abdomen - soft, nontender, obese.  Liver, spleen not palpable  Back exam - range of motion limited  Neurological - alert, oriented, normal speech, no focal findings or movement disorder noted  Musculoskeletal - DJD changes noted  Extremities - no edema noted  Skin - normal coloration and turgor, no rashes, no suspicious skin lesions noted    LABS   Results for orders placed or performed during the hospital encounter of 16/10/96   METABOLIC PANEL, COMPREHENSIVE   Result Value Ref Range    Sodium 141 136 - 145 mmol/L    Potassium 4.1 3.5 - 5.1 mmol/L     Chloride 107 98 - 107 mmol/L    CO2 28 21 - 32 mmol/L    Anion gap 6 (L) 7 - 16 mmol/L    Glucose 103 (H) 65 - 100 mg/dL    BUN 17 8 - 23 MG/DL    Creatinine 1.19 (H) 0.6 - 1.0 MG/DL  GFR est AA 56 (L) >60 ml/min/1.12m    GFR est non-AA 46 (L) >60 ml/min/1.730m   Calcium 8.3 8.3 - 10.4 MG/DL    Bilirubin, total 0.4 0.2 - 1.1 MG/DL    ALT 25 12 - 65 U/L    AST 27 15 - 37 U/L    Alk. phosphatase 140 (H) 50 - 136 U/L    Protein, total 7.8 6.3 - 8.2 g/dL    Albumin 3.6 3.2 - 4.6 g/dL    Globulin 4.2 (H) 2.3 - 3.5 g/dL    A-G Ratio 0.9 (L) 1.2 - 3.5     CBC W/O DIFF   Result Value Ref Range    WBC 4.5 4.3 - 11.1 K/uL    RBC 3.75 (L) 4.05 - 5.25 M/uL    HGB 11.3 (L) 11.7 - 15.4 g/dL    HCT 35.5 (L) 35.8 - 46.3 %    MCV 94.7 79.6 - 97.8 FL    MCH 30.1 26.1 - 32.9 PG    MCHC 31.8 31.4 - 35.0 g/dL    RDW 14.5 11.9 - 14.6 %    PLATELET 217 150 - 450 K/uL    MPV 10.4 (L) 10.8 - 14.1 FL   EKG, 12 LEAD, INITIAL   Result Value Ref Range    Systolic BP  mmHg    Diastolic BP  mmHg    Ventricular Rate 74 BPM    Atrial Rate 74 BPM    P-R Interval 148 ms    QRS Duration 72 ms    Q-T Interval 386 ms    QTC Calculation (Bezet) 428 ms    Calculated P Axis 76 degrees    Calculated R Axis 40 degrees    Calculated T Axis 59 degrees    Diagnosis       Normal sinus rhythm  Normal ECG  When compared with ECG of 18-Feb-2013 13:36,  No significant change was found  Confirmed by GRABARCZYK  MD (UC), MARK A ([id number]) on 01/06/2014 12:48:32 PM             IMPRESSION     ICD-10-CM ICD-9-CM    1. Abdominal pain, epigastric R10.13 789.06 AMB POC COMPLETE CBC,AUTOMATED ENTER      AMB POC URINALYSIS DIP STICK AUTO W/O MICRO (CIM)      COLLECTION VENOUS BLOOD,VENIPUNCTURE      AMB POC HELICOBACTER PYLORI      METABOLIC PANEL, COMPREHENSIVE      MAGNESIUM      AMYLASE      LIPASE   2. Benign essential HTN I10 401.1 MAGNESIUM   3. Insomnia G47.00 780.52    4. Depression F32.9 311    5. Dyslipidemia E78.5 272.4     6. Coronary artery disease involving native coronary artery without angina pectoris I25.10 414.01          PLAN : I discussed with patient, reviewed the findings, will have blood drawn including amylase, mild lipase and magnesium.  We will have follow-up visit with me.  I have also added Carafate 1 g 3-4 times in a daily along with her Nexium.  She is also advised to monitor dietary regimen.  Avoid all spicy food.  Follow-up is recommended.  Continue the medication.  See orders.        SUZenovia JarredMD          Dictated using voice recognition software. Proofread, but unrecognized voice recognition errors may exist.

## 2014-02-08 LAB — METABOLIC PANEL, COMPREHENSIVE
A-G Ratio: 1.2 (ref 1.1–2.5)
ALT (SGPT): 22 IU/L (ref 0–32)
AST (SGOT): 30 IU/L (ref 0–40)
Albumin: 4.1 g/dL (ref 3.5–4.7)
Alk. phosphatase: 137 IU/L — ABNORMAL HIGH (ref 39–117)
BUN/Creatinine ratio: 19 (ref 11–26)
BUN: 21 mg/dL (ref 8–27)
Bilirubin, total: 0.2 mg/dL (ref 0.0–1.2)
CO2: 25 mmol/L (ref 18–29)
Calcium: 9.3 mg/dL (ref 8.7–10.3)
Chloride: 101 mmol/L (ref 97–108)
Creatinine: 1.11 mg/dL — ABNORMAL HIGH (ref 0.57–1.00)
GFR est AA: 53 mL/min/{1.73_m2} — ABNORMAL LOW (ref >59)
GFR est non-AA: 46 mL/min/{1.73_m2} — ABNORMAL LOW (ref >59)
GLOBULIN, TOTAL: 3.4 g/dL (ref 1.5–4.5)
Glucose: 70 mg/dL (ref 65–99)
Potassium: 4.7 mmol/L (ref 3.5–5.2)
Protein, total: 7.5 g/dL (ref 6.0–8.5)
Sodium: 139 mmol/L (ref 134–144)

## 2014-02-08 LAB — MAGNESIUM: Magnesium: 2.6 mg/dL — ABNORMAL HIGH (ref 1.6–2.3)

## 2014-02-08 LAB — AMYLASE: Amylase: 61 U/L (ref 31–124)

## 2014-02-08 LAB — LIPASE: Lipase: 27 U/L (ref 0–59)

## 2014-02-12 MED ORDER — AMOXICILLIN 500 MG CAP
500 mg | ORAL_CAPSULE | Freq: Three times a day (TID) | ORAL | Status: AC
Start: 2014-02-12 — End: 2014-02-22

## 2014-02-12 MED ORDER — CLARITHROMYCIN 500 MG TAB
500 mg | ORAL_TABLET | Freq: Two times a day (BID) | ORAL | Status: AC
Start: 2014-02-12 — End: 2014-02-22

## 2014-02-12 NOTE — Telephone Encounter (Signed)
Orders Placed This Encounter   ??? clarithromycin (BIAXIN) 500 mg tablet     Sig: Take 1 Tab by mouth two (2) times a day for 10 days.     Dispense:  20 Tab     Refill:  0   ??? amoxicillin (AMOXIL) 500 mg capsule     Sig: Take 1 Cap by mouth three (3) times daily for 10 days.     Dispense:  30 Cap     Refill:  0     Per dr, I spoke to pt,cv

## 2014-03-12 ENCOUNTER — Ambulatory Visit: Admit: 2014-03-12 | Discharge: 2014-03-12 | Payer: MEDICARE | Attending: Specialist | Primary: Specialist

## 2014-03-12 DIAGNOSIS — I10 Essential (primary) hypertension: Secondary | ICD-10-CM

## 2014-03-12 MED ORDER — DULOXETINE 60 MG CAP, DELAYED RELEASE
60 mg | ORAL_CAPSULE | Freq: Every day | ORAL | Status: DC
Start: 2014-03-12 — End: 2014-06-26

## 2014-03-12 MED ORDER — VALSARTAN 160 MG TAB
160 mg | ORAL_TABLET | Freq: Every day | ORAL | Status: DC
Start: 2014-03-12 — End: 2014-12-05
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
## 2014-05-28 ENCOUNTER — Ambulatory Visit: Admit: 2014-05-28 | Discharge: 2014-05-28 | Payer: MEDICARE | Attending: Specialist | Primary: Specialist

## 2014-05-28 DIAGNOSIS — E785 Hyperlipidemia, unspecified: Secondary | ICD-10-CM

## 2014-05-28 MED ORDER — PRAVASTATIN 40 MG TAB
40 mg | ORAL_TABLET | Freq: Every evening | ORAL | Status: DC
Start: 2014-05-28 — End: 2014-12-05

## 2014-05-28 MED ORDER — TEMAZEPAM 15 MG CAP
15 mg | ORAL_CAPSULE | ORAL | Status: DC
Start: 2014-05-28 — End: 2014-06-26

## 2014-05-28 MED ORDER — CLOPIDOGREL 75 MG TAB
75 mg | ORAL_TABLET | Freq: Every day | ORAL | Status: DC
Start: 2014-05-28 — End: 2014-12-05
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
## 2014-06-11 ENCOUNTER — Encounter: Attending: Specialist | Primary: Specialist

## 2014-06-13 ENCOUNTER — Ambulatory Visit: Admit: 2014-06-13 | Discharge: 2014-06-13 | Payer: MEDICARE | Attending: Specialist | Primary: Specialist

## 2014-06-13 DIAGNOSIS — M79605 Pain in left leg: Secondary | ICD-10-CM

## 2014-06-13 MED ORDER — METHYLPREDNISOLONE 80 MG/ML SUSP FOR INJECTION
80 mg/mL | Freq: Once | INTRAMUSCULAR | Status: AC
Start: 2014-06-13 — End: 2014-06-13
  Administered 2014-06-13: 18:00:00 via INTRAMUSCULAR
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
## 2014-06-26 ENCOUNTER — Ambulatory Visit: Admit: 2014-06-26 | Discharge: 2014-06-26 | Payer: MEDICARE | Attending: Specialist | Primary: Specialist

## 2014-06-26 DIAGNOSIS — Z0000 Encounter for general adult medical examination without abnormal findings: Secondary | ICD-10-CM

## 2014-06-26 MED ORDER — DULOXETINE 60 MG CAP, DELAYED RELEASE
60 mg | ORAL_CAPSULE | Freq: Every day | ORAL | Status: DC
Start: 2014-06-26 — End: 2014-12-05

## 2014-06-26 MED ORDER — CELECOXIB 200 MG CAP
200 mg | ORAL_CAPSULE | ORAL | Status: DC
Start: 2014-06-26 — End: 2014-12-05

## 2014-06-26 MED ORDER — LEVOTHYROXINE 50 MCG TAB
50 mcg | ORAL_TABLET | ORAL | Status: DC
Start: 2014-06-26 — End: 2014-12-05

## 2014-06-26 MED ORDER — TEMAZEPAM 15 MG CAP
15 mg | ORAL_CAPSULE | ORAL | Status: DC
Start: 2014-06-26 — End: 2014-12-05
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
## 2014-06-26 NOTE — Progress Notes (Signed)
I called rx into [facility] in [place]  cv

## 2014-06-28 ENCOUNTER — Emergency Department: Admit: 2014-06-29 | Payer: MEDICARE | Primary: Specialist

## 2014-06-28 ENCOUNTER — Emergency Department: Payer: MEDICARE | Primary: Specialist

## 2014-06-28 DIAGNOSIS — R042 Hemoptysis: Secondary | ICD-10-CM

## 2014-06-28 NOTE — ED Notes (Signed)
C/o coughing up bright red blood. Onset yesterday. Denies hx of same. Denies smoking. Hx copd. sats 98% on arrival. Denies pain on arrival. Admits to shortness of breath however states is normal.

## 2014-06-29 ENCOUNTER — Emergency Department: Admit: 2014-06-29 | Payer: MEDICARE | Primary: Specialist

## 2014-06-29 ENCOUNTER — Inpatient Hospital Stay
Admit: 2014-06-29 | Discharge: 2014-06-29 | Disposition: A | Discharge status: Home / Self Care | Payer: MEDICARE | Attending: Emergency Medicine

## 2014-06-29 LAB — METABOLIC PANEL, COMPREHENSIVE
A-G Ratio: 0.8 — ABNORMAL LOW (ref 1.2–3.5)
ALT (SGPT): 19 U/L (ref 12–65)
AST (SGOT): 22 U/L (ref 15–37)
Albumin: 3.4 g/dL (ref 3.2–4.6)
Alk. phosphatase: 115 U/L (ref 50–136)
Anion gap: 3 mmol/L — ABNORMAL LOW (ref 7–16)
BUN: 26 MG/DL — ABNORMAL HIGH (ref 8–23)
Bilirubin, total: 0.4 MG/DL (ref 0.2–1.1)
CO2: 29 mmol/L (ref 21–32)
Calcium: 8.4 MG/DL (ref 8.3–10.4)
Chloride: 107 mmol/L (ref 98–107)
Creatinine: 1.13 MG/DL — ABNORMAL HIGH (ref 0.6–1.0)
GFR est AA: 59 mL/min/{1.73_m2} — ABNORMAL LOW (ref >60)
GFR est non-AA: 49 mL/min/{1.73_m2} — ABNORMAL LOW (ref >60)
Globulin: 4.1 g/dL — ABNORMAL HIGH (ref 2.3–3.5)
Glucose: 114 mg/dL — ABNORMAL HIGH (ref 65–100)
Potassium: 4.7 mmol/L (ref 3.5–5.1)
Protein, total: 7.5 g/dL (ref 6.3–8.2)
Sodium: 139 mmol/L (ref 136–145)

## 2014-06-29 LAB — PROTHROMBIN TIME + INR
INR: 0.9 (ref 0.9–1.2)
Prothrombin time: 10 s (ref 9.6–12.0)

## 2014-06-29 LAB — CBC WITH AUTOMATED DIFF
ABS. BASOPHILS: 0 10*3/uL (ref 0.0–0.2)
ABS. EOSINOPHILS: 0.1 10*3/uL (ref 0.0–0.8)
ABS. IMM. GRANS.: 0 10*3/uL (ref 0.0–0.5)
ABS. LYMPHOCYTES: 1.1 10*3/uL (ref 0.5–4.6)
ABS. MONOCYTES: 0.6 10*3/uL (ref 0.1–1.3)
ABS. NEUTROPHILS: 5.4 10*3/uL (ref 1.7–8.2)
BASOPHILS: 0 % (ref 0.0–2.0)
EOSINOPHILS: 1 % (ref 0.5–7.8)
HCT: 35.6 % — ABNORMAL LOW (ref 35.8–46.3)
HGB: 11.3 g/dL — ABNORMAL LOW (ref 11.7–15.4)
IMMATURE GRANULOCYTES: 0.4 % (ref 0.0–5.0)
LYMPHOCYTES: 16 % (ref 13–44)
MCH: 30.1 PG (ref 26.1–32.9)
MCHC: 31.7 g/dL (ref 31.4–35.0)
MCV: 94.9 FL (ref 79.6–97.8)
MONOCYTES: 9 % (ref 4.0–12.0)
MPV: 10.9 FL (ref 10.8–14.1)
NEUTROPHILS: 74 % (ref 43–78)
PLATELET: 215 10*3/uL (ref 150–450)
RBC: 3.75 M/uL — ABNORMAL LOW (ref 4.05–5.25)
RDW: 15 % — ABNORMAL HIGH (ref 11.9–14.6)
WBC: 7.3 10*3/uL (ref 4.3–11.1)

## 2014-06-29 LAB — PTT: aPTT: 23.3 s — ABNORMAL LOW (ref 23.5–31.7)

## 2014-06-29 MED ORDER — ONDANSETRON 8 MG TAB, RAPID DISSOLVE
8 mg | ORAL | Status: AC
Start: 2014-06-29 — End: 2014-06-29
  Administered 2014-06-29: 05:00:00 via ORAL

## 2014-06-29 MED ORDER — LEVOFLOXACIN 750 MG TAB
750 mg | ORAL_TABLET | Freq: Every day | ORAL | Status: DC
Start: 2014-06-29 — End: 2014-07-14

## 2014-06-29 MED ORDER — OXYCODONE 5 MG TAB
5 mg | ORAL | Status: AC
Start: 2014-06-29 — End: 2014-06-29
  Administered 2014-06-29: 05:00:00 via ORAL

## 2014-06-29 MED ORDER — BENZONATATE 200 MG CAP
200 mg | ORAL_CAPSULE | Freq: Three times a day (TID) | ORAL | Status: AC | PRN
Start: 2014-06-29 — End: 2014-07-06

## 2014-06-29 MED ORDER — LEVOFLOXACIN 250 MG TAB
250 mg | ORAL | Status: AC
Start: 2014-06-29 — End: 2014-06-29
  Administered 2014-06-29: 07:00:00 via ORAL

## 2014-06-29 MED ORDER — SALINE PERIPHERAL FLUSH PRN
Freq: Once | INTRAMUSCULAR | Status: AC
Start: 2014-06-29 — End: 2014-06-29
  Administered 2014-06-29: 06:00:00

## 2014-06-29 MED ORDER — HYDROCODONE 10 MG-CHLORPHENIRAMINE 8 MG/5 ML ORAL SUSP EXTEND.REL 12HR
10-8 mg/5 mL | Freq: Two times a day (BID) | ORAL | Status: DC | PRN
Start: 2014-06-29 — End: 2014-07-14

## 2014-06-29 MED ORDER — IOPAMIDOL 76 % IV SOLN
370 mg iodine /mL (76 %) | Freq: Once | INTRAVENOUS | Status: AC
Start: 2014-06-29 — End: 2014-06-29
  Administered 2014-06-29: 06:00:00 via INTRAVENOUS

## 2014-06-29 MED ORDER — SODIUM CHLORIDE 0.9% BOLUS IV
0.9 % | Freq: Once | INTRAVENOUS | Status: AC
Start: 2014-06-29 — End: 2014-06-29
  Administered 2014-06-29: 06:00:00 via INTRAVENOUS

## 2014-06-29 MED ORDER — BENZONATATE 100 MG CAP
100 mg | ORAL | Status: AC
Start: 2014-06-29 — End: 2014-06-29
  Administered 2014-06-29: 07:00:00 via ORAL

## 2014-06-29 MED FILL — LEVOFLOXACIN 500 MG TAB: 500 mg | ORAL | Qty: 1

## 2014-06-29 MED FILL — OXYCODONE 5 MG TAB: 5 mg | ORAL | Qty: 2

## 2014-06-29 MED FILL — BENZONATATE 100 MG CAP: 100 mg | ORAL | Qty: 2

## 2014-06-29 MED FILL — ONDANSETRON 8 MG TAB, RAPID DISSOLVE: 8 mg | ORAL | Qty: 1

## 2014-06-29 NOTE — ED Notes (Signed)
Report received from Lorrie RN

## 2014-06-29 NOTE — ED Notes (Signed)
Report given to Ashley Rn
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
## 2014-07-03 ENCOUNTER — Ambulatory Visit: Admit: 2014-07-03 | Discharge: 2014-07-03 | Payer: MEDICARE | Attending: Specialist | Primary: Specialist

## 2014-07-03 DIAGNOSIS — R918 Other nonspecific abnormal finding of lung field: Secondary | ICD-10-CM
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
## 2014-07-11 ENCOUNTER — Encounter

## 2014-07-14 ENCOUNTER — Encounter: Primary: Specialist

## 2014-07-14 ENCOUNTER — Ambulatory Visit: Admit: 2014-07-14 | Discharge: 2014-07-14 | Payer: MEDICARE | Attending: Internal Medicine | Primary: Specialist

## 2014-07-14 DIAGNOSIS — R918 Other nonspecific abnormal finding of lung field: Secondary | ICD-10-CM

## 2014-07-14 LAB — AMB POC SPIROMETRY
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
## 2014-07-14 NOTE — Addendum Note (Signed)
Addended by: Lyda Jester on: 07/14/2014 02:13 PM      Modules accepted: Orders

## 2014-07-25 ENCOUNTER — Inpatient Hospital Stay: Admit: 2014-07-25 | Payer: MEDICARE | Attending: Internal Medicine | Primary: Specialist

## 2014-07-25 DIAGNOSIS — R918 Other nonspecific abnormal finding of lung field: Secondary | ICD-10-CM

## 2014-07-28 ENCOUNTER — Encounter: Attending: Specialist | Primary: Specialist

## 2014-07-28 NOTE — Telephone Encounter (Signed)
CT reviewed from 6/24, 1 month s/p antibiotics and stopping Plavix.  Opacity was in area of bronchiectasis in right upper lobe and is improving.  If patient is still coughing, I would obtain sputum cultures prior to next appointment.  I called and left a message for her to call us back to review results of this scan.  Harriett RushJulia G Merdith Boyd, MD  May CT (hemoptysis)      June CT (no hemoptysis, still with cough)

## 2014-07-30 ENCOUNTER — Inpatient Hospital Stay: Admit: 2014-07-30 | Payer: MEDICARE | Primary: Specialist

## 2014-07-30 ENCOUNTER — Encounter

## 2014-07-30 DIAGNOSIS — R05 Cough: Secondary | ICD-10-CM

## 2014-08-01 ENCOUNTER — Ambulatory Visit: Admit: 2014-08-01 | Discharge: 2014-08-01 | Payer: MEDICARE | Attending: Specialist | Primary: Specialist

## 2014-08-01 DIAGNOSIS — I10 Essential (primary) hypertension: Secondary | ICD-10-CM

## 2014-08-01 LAB — CULTURE, RESPIRATORY/SPUTUM/BRONCH W GRAM STAIN
GRAM STAIN: 0
GRAM STAIN: 0
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
## 2014-08-08 ENCOUNTER — Ambulatory Visit: Admit: 2014-08-08 | Discharge: 2014-08-08 | Payer: MEDICARE | Attending: Internal Medicine | Primary: Specialist

## 2014-08-08 DIAGNOSIS — R059 Cough, unspecified: Secondary | ICD-10-CM

## 2014-08-08 MED ORDER — HYDROCODONE-HOMATROPINE 5 MG-1.5 MG/5 ML (5 ML) ORAL SOLUTION
Freq: Two times a day (BID) | ORAL | Status: DC | PRN
Start: 2014-08-08 — End: 2014-12-05

## 2014-08-08 MED ORDER — NITROGLYCERIN 0.4 MG SUBLINGUAL TAB
0.4 mg | ORAL_TABLET | SUBLINGUAL | Status: DC | PRN
Start: 2014-08-08 — End: 2015-04-20
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
## 2014-08-14 NOTE — Telephone Encounter (Signed)
Called to notify patient of PI*MZ result.  She was not home, so message left.  Harriett RushJulia G Laylia Mui, MD

## 2014-08-14 NOTE — Telephone Encounter (Signed)
Spoke w patient who called back about result.  Harriett RushJulia G Nahom Carfagno, MD

## 2014-08-14 NOTE — Progress Notes (Signed)
Quick Note:        Note that patient is MZ and has normal alpha-1 antitrypsin protein levels (limited risk for lung disease). The Z allele is associated with cirrhosis, but patients with PI*ZZ are at greatest risk. Should make sure GI doc is aware of her PI*MZ status in light of cirrhotic changes on CT Chest.    Natalie RushJulia G Kalecia Hartney, MD        ______

## 2014-08-14 NOTE — Progress Notes (Signed)
After the phone conversation between the patient and Dr. Suzie PortelaPayne regarding her Alpha-1 results, I have printed her results and a Alpha-1 brochure and mailed to the patient per Dr. Rhunette CroftPayne's request. Luan MooreKrystal L Jeromey Kruer, RMA

## 2014-08-14 NOTE — Progress Notes (Signed)
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  This may contribute to her risk for cirrhosis as the Z allele is known to misfold in the ER of hepatocytes.    Harriett RushJulia G Jakaiden Fill, MD

## 2014-12-05 ENCOUNTER — Ambulatory Visit: Admit: 2014-12-05 | Discharge: 2014-12-05 | Payer: MEDICARE | Attending: Specialist | Primary: Specialist

## 2014-12-05 DIAGNOSIS — I10 Essential (primary) hypertension: Secondary | ICD-10-CM

## 2014-12-05 LAB — AMB POC COMPLETE CBC,AUTOMATED ENTER
ABS. GRANS (POC): 6.4 10*3/uL (ref 1.4–6.5)
ABS. LYMPHS (POC): 1.9 10*3/uL (ref 1.2–3.4)
ABS. MONOS (POC): 0.7 10*3/uL — AB (ref 0.1–0.6)
GRANULOCYTES (POC): 70.9 % (ref 42.2–75.2)
HCT (POC): 34.3 % — AB (ref 35–60)
HGB (POC): 10.8 g/dL — AB (ref 11–18)
LYMPHOCYTES (POC): 21.5 % (ref 20.5–51.1)
MCH (POC): 28.2 pg (ref 27–31)
MCHC (POC): 31.5 g/dL — AB (ref 33–37)
MCV (POC): 89.6 fL (ref 80–99.9)
MONOCYTES (POC): 7.6 % (ref 1.7–9.3)
MPV (POC): 8 fL (ref 7.8–11)
PLATELET (POC): 216 10*3/uL (ref 150–450)
RBC (POC): 3.83 10*6/uL — AB (ref 4–6)
RDW (POC): 15.8 % — AB (ref 11.6–13.7)
WBC (POC): 9 10*3/uL (ref 4.5–10.5)

## 2014-12-05 LAB — AMB POC URINALYSIS DIP STICK AUTO W/O MICRO
Bilirubin (UA POC): NEGATIVE
Blood (UA POC): NEGATIVE
Glucose (UA POC): NEGATIVE mg/dL
Ketones (UA POC): NEGATIVE
Nitrites (UA POC): NEGATIVE
Protein (UA POC): NEGATIVE
Specific gravity (UA POC): 1.02 (ref 1.001–1.035)
Urobilinogen (UA POC): 0.2 (ref 0.2–1)
pH (UA POC): 5.5 (ref 4.6–8.0)

## 2014-12-05 MED ORDER — VALSARTAN 160 MG TAB
160 mg | ORAL_TABLET | Freq: Every day | ORAL | 3 refills | Status: DC
Start: 2014-12-05 — End: 2015-02-06

## 2014-12-05 MED ORDER — PRAVASTATIN 40 MG TAB
40 mg | ORAL_TABLET | Freq: Every evening | ORAL | 3 refills | Status: DC
Start: 2014-12-05 — End: 2015-02-06

## 2014-12-05 MED ORDER — TEMAZEPAM 15 MG CAP
15 mg | ORAL_CAPSULE | ORAL | 3 refills | Status: DC
Start: 2014-12-05 — End: 2015-02-06

## 2014-12-05 MED ORDER — LEVOTHYROXINE 50 MCG TAB
50 mcg | ORAL_TABLET | ORAL | 3 refills | Status: DC
Start: 2014-12-05 — End: 2015-02-06

## 2014-12-05 MED ORDER — CELECOXIB 200 MG CAP
200 mg | ORAL_CAPSULE | ORAL | 3 refills | Status: DC
Start: 2014-12-05 — End: 2015-02-06

## 2014-12-05 MED ORDER — DULOXETINE 60 MG CAP, DELAYED RELEASE
60 mg | ORAL_CAPSULE | Freq: Every day | ORAL | 3 refills | Status: DC
Start: 2014-12-05 — End: 2015-02-06

## 2014-12-05 MED ORDER — CLOPIDOGREL 75 MG TAB
75 mg | ORAL_TABLET | Freq: Every day | ORAL | 3 refills | Status: DC
Start: 2014-12-05 — End: 2015-02-06
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
## 2014-12-05 NOTE — Progress Notes (Signed)
Are you allergic to eggs? NO   Have you ever had a serious reaction to any vaccine in the past? NO   Allergies on file:   No Known Allergies   Was the patient given a printed handout on the flu vaccine? YES

## 2014-12-06 LAB — LIPID PANEL
Cholesterol, total: 235 mg/dL — ABNORMAL HIGH (ref 100–199)
HDL Cholesterol: 79 mg/dL (ref >39)
LDL, calculated: 121 mg/dL — ABNORMAL HIGH (ref 0–99)
Triglyceride: 175 mg/dL — ABNORMAL HIGH (ref 0–149)
VLDL, calculated: 35 mg/dL (ref 5–40)

## 2014-12-06 LAB — METABOLIC PANEL, COMPREHENSIVE
A-G Ratio: 1.3 (ref 1.1–2.5)
ALT (SGPT): 8 IU/L (ref 0–32)
AST (SGOT): 20 IU/L (ref 0–40)
Albumin: 3.9 g/dL (ref 3.5–4.7)
Alk. phosphatase: 102 IU/L (ref 39–117)
BUN/Creatinine ratio: 18 (ref 11–26)
BUN: 24 mg/dL (ref 8–27)
Bilirubin, total: 0.2 mg/dL (ref 0.0–1.2)
CO2: 26 mmol/L (ref 18–29)
Calcium: 9.2 mg/dL (ref 8.7–10.3)
Chloride: 100 mmol/L (ref 97–106)
Creatinine: 1.31 mg/dL — ABNORMAL HIGH (ref 0.57–1.00)
GFR est AA: 43 mL/min/{1.73_m2} — ABNORMAL LOW (ref >59)
GFR est non-AA: 38 mL/min/{1.73_m2} — ABNORMAL LOW (ref >59)
GLOBULIN, TOTAL: 2.9 g/dL (ref 1.5–4.5)
Glucose: 103 mg/dL — ABNORMAL HIGH (ref 65–99)
Potassium: 4.6 mmol/L (ref 3.5–5.2)
Protein, total: 6.8 g/dL (ref 6.0–8.5)
Sodium: 140 mmol/L (ref 136–144)

## 2014-12-15 LAB — PROTIME-INR: INR: 1 (ref 0.9–1.1)

## 2015-02-06 ENCOUNTER — Ambulatory Visit: Admit: 2015-02-06 | Discharge: 2015-02-06 | Payer: MEDICARE | Attending: Specialist | Primary: Specialist

## 2015-02-06 DIAGNOSIS — I10 Essential (primary) hypertension: Secondary | ICD-10-CM

## 2015-02-06 MED ORDER — DULOXETINE 60 MG CAP, DELAYED RELEASE
60 mg | ORAL_CAPSULE | Freq: Every day | ORAL | 3 refills | Status: DC
Start: 2015-02-06 — End: 2015-09-11

## 2015-02-06 MED ORDER — VALSARTAN 160 MG TAB
160 mg | ORAL_TABLET | Freq: Every day | ORAL | 3 refills | Status: DC
Start: 2015-02-06 — End: 2015-09-11

## 2015-02-06 MED ORDER — TEMAZEPAM 15 MG CAP
15 mg | ORAL_CAPSULE | ORAL | 3 refills | Status: DC
Start: 2015-02-06 — End: 2015-04-20

## 2015-02-06 MED ORDER — PRAVASTATIN 40 MG TAB
40 mg | ORAL_TABLET | Freq: Every evening | ORAL | 3 refills | Status: DC
Start: 2015-02-06 — End: 2015-09-11

## 2015-02-06 MED ORDER — LEVOTHYROXINE 50 MCG TAB
50 mcg | ORAL_TABLET | ORAL | 3 refills | Status: DC
Start: 2015-02-06 — End: 2016-03-05

## 2015-02-06 MED ORDER — CLOPIDOGREL 75 MG TAB
75 mg | ORAL_TABLET | Freq: Every day | ORAL | 3 refills | Status: DC
Start: 2015-02-06 — End: 2015-09-11

## 2015-02-06 MED ORDER — CELECOXIB 200 MG CAP
200 mg | ORAL_CAPSULE | ORAL | 3 refills | Status: DC
Start: 2015-02-06 — End: 2015-05-27
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
## 2015-03-03 ENCOUNTER — Ambulatory Visit: Admit: 2015-03-03 | Discharge: 2015-03-03 | Payer: MEDICARE | Attending: Internal Medicine | Primary: Specialist

## 2015-03-03 DIAGNOSIS — J479 Bronchiectasis, uncomplicated: Secondary | ICD-10-CM

## 2015-03-03 LAB — AMB POC SPIROMETRY

## 2015-03-03 MED ORDER — ALBUTEROL SULFATE HFA 90 MCG/ACTUATION AEROSOL INHALER
90 mcg/actuation | RESPIRATORY_TRACT | 11 refills | Status: DC | PRN
Start: 2015-03-03 — End: 2017-04-25

## 2015-03-03 MED ORDER — UMECLIDINIUM 62.5 MCG-VILANTEROL 25 MCG/ACTUATION POWDR FOR INHALATION
Freq: Every day | RESPIRATORY_TRACT | 5 refills | Status: DC
Start: 2015-03-03 — End: 2016-09-19
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
## 2015-03-04 ENCOUNTER — Encounter

## 2015-03-05 ENCOUNTER — Encounter

## 2015-03-24 ENCOUNTER — Inpatient Hospital Stay: Admit: 2015-03-24 | Payer: MEDICARE | Attending: Internal Medicine | Primary: Specialist

## 2015-03-24 DIAGNOSIS — R0609 Other forms of dyspnea: Secondary | ICD-10-CM

## 2015-04-07 LAB — PROTIME-INR: PROTIME: 1 — AB (ref 10.0–13.8)

## 2015-04-09 LAB — IRON,TIBC AND FERRITIN PANEL
%SAT: 15
Iron: 51
TIBC: 349
UIBC: 298

## 2015-04-09 LAB — VITAMIN B12: Vitamin B-12: 476

## 2015-04-10 ENCOUNTER — Encounter: Attending: Specialist | Primary: Specialist

## 2015-04-20 ENCOUNTER — Ambulatory Visit: Admit: 2015-04-20 | Discharge: 2015-04-20 | Payer: MEDICARE | Attending: Specialist | Primary: Specialist

## 2015-04-20 DIAGNOSIS — I10 Essential (primary) hypertension: Secondary | ICD-10-CM

## 2015-04-20 MED ORDER — TEMAZEPAM 15 MG CAP
15 mg | ORAL_CAPSULE | Freq: Every evening | ORAL | 3 refills | Status: DC
Start: 2015-04-20 — End: 2015-07-29

## 2015-04-20 MED ORDER — DIPHTH,PERTUSSIS(ACELL),TETANUS 2.5 LF UNIT-8 MCG-5 LF/0.5 ML IM SUSP
Freq: Once | INTRAMUSCULAR | 0 refills | Status: AC
Start: 2015-04-20 — End: 2015-04-20

## 2015-04-20 MED ORDER — FERROUS FUMARATE 324 MG (106 MG IRON) TABLET
324 mg (106 mg iron) | ORAL_TABLET | Freq: Every day | ORAL | 4 refills | Status: DC
Start: 2015-04-20 — End: 2016-05-16

## 2015-04-20 MED ORDER — NITROGLYCERIN 0.4 MG SUBLINGUAL TAB
0.4 mg | ORAL_TABLET | SUBLINGUAL | 99 refills | Status: DC | PRN
Start: 2015-04-20 — End: 2016-05-16

## 2015-04-20 MED ORDER — ZOSTER VACCINE LIVE (PF) 19,400 UNIT SUB-Q SOLN
19400 unit/0.65 mL | Freq: Once | SUBCUTANEOUS | 0 refills | Status: AC
Start: 2015-04-20 — End: 2015-04-20
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
## 2015-05-15 NOTE — Telephone Encounter (Signed)
Last office visit was 04/29/13. Pt will need to schedule appointment for surgical clearance. I called and lmom for pt to call back.

## 2015-05-15 NOTE — Telephone Encounter (Signed)
Cardiac Clearance      Surgical, Procedural, or Medication Clearance      Physician or Practice Requesting Clearance: Dr Lenon CurtMichael Rickoff / [facility]  Contact Person: only Dr Jonell Cluckickoff listed  Contact Phone Number: [phone]  Fax Number: [phone]  Date of Surgery/Procedure: not scheduled yet  Type of Surgery or Procedure:  Colonoscopy / liver biopsy  Type of Anesthesia: ?   Medication to Hold: plavix  Days to Hold: 5 days prior, 14 day post pending biopsy

## 2015-05-18 DIAGNOSIS — R0600 Dyspnea, unspecified: Secondary | ICD-10-CM | POA: Insufficient documentation

## 2015-05-25 ENCOUNTER — Ambulatory Visit: Admit: 2015-05-25 | Discharge: 2015-05-25 | Payer: MEDICARE | Attending: Cardiovascular Disease | Primary: Specialist

## 2015-05-25 DIAGNOSIS — Z01818 Encounter for other preprocedural examination: Secondary | ICD-10-CM
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
## 2015-05-27 ENCOUNTER — Ambulatory Visit: Admit: 2015-05-27 | Discharge: 2015-05-27 | Payer: MEDICARE | Attending: Specialist | Primary: Specialist

## 2015-05-27 DIAGNOSIS — I2510 Atherosclerotic heart disease of native coronary artery without angina pectoris: Secondary | ICD-10-CM

## 2015-05-27 LAB — AMB POC COMPLETE CBC,AUTOMATED ENTER
ABS. GRANS (POC): 3.4 10*3/uL (ref 1.4–6.5)
ABS. LYMPHS (POC): 0.9 10*3/uL — AB (ref 1.2–3.4)
ABS. MONOS (POC): 0.2 10*3/uL (ref 0.1–0.6)
GRANULOCYTES (POC): 75.7 % — AB (ref 42.2–75.2)
HCT (POC): 37 % (ref 35–60)
HGB (POC): 11.3 g/dL (ref 11–18)
LYMPHOCYTES (POC): 19.1 % — AB (ref 20.5–51.1)
MCH (POC): 28.5 pg (ref 27–31)
MCHC (POC): 30.7 g/dL — AB (ref 33–37)
MCV (POC): 93 fL (ref 80–99.9)
MONOCYTES (POC): 5.2 % (ref 1.7–9.3)
MPV (POC): 8.5 fL (ref 7.8–11)
PLATELET (POC): 186 10*3/uL (ref 150–450)
RBC (POC): 3.98 10*6/uL — AB (ref 4–6)
RDW (POC): 17 % — AB (ref 11.6–13.7)
WBC (POC): 4.5 10*3/uL (ref 4.5–10.5)

## 2015-05-27 LAB — AMB POC URINALYSIS DIP STICK AUTO W/O MICRO
Bilirubin (UA POC): NEGATIVE
Glucose (UA POC): NEGATIVE mg/dL
Ketones (UA POC): NEGATIVE
Nitrites (UA POC): NEGATIVE
Specific gravity (UA POC): 1.02 (ref 1.001–1.035)
Urobilinogen (UA POC): 0.2 (ref 0.2–1)
pH (UA POC): 7 (ref 4.6–8.0)
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
## 2015-05-28 LAB — METABOLIC PANEL, COMPREHENSIVE
A-G Ratio: 1.4 (ref 1.2–2.2)
ALT (SGPT): 8 IU/L (ref 0–32)
AST (SGOT): 19 IU/L (ref 0–40)
Albumin: 4.3 g/dL (ref 3.5–4.7)
Alk. phosphatase: 92 IU/L (ref 39–117)
BUN/Creatinine ratio: 15 (ref 12–28)
BUN: 17 mg/dL (ref 8–27)
Bilirubin, total: 0.2 mg/dL (ref 0.0–1.2)
CO2: 25 mmol/L (ref 18–29)
Calcium: 9.3 mg/dL (ref 8.7–10.3)
Chloride: 103 mmol/L (ref 96–106)
Creatinine: 1.11 mg/dL — ABNORMAL HIGH (ref 0.57–1.00)
GFR est AA: 53 mL/min/{1.73_m2} — ABNORMAL LOW (ref >59)
GFR est non-AA: 46 mL/min/{1.73_m2} — ABNORMAL LOW (ref >59)
GLOBULIN, TOTAL: 3 g/dL (ref 1.5–4.5)
Glucose: 92 mg/dL (ref 65–99)
Potassium: 5.1 mmol/L (ref 3.5–5.2)
Protein, total: 7.3 g/dL (ref 6.0–8.5)
Sodium: 141 mmol/L (ref 134–144)

## 2015-06-18 NOTE — Telephone Encounter (Signed)
Calling to get clearance from note on 05/15/15 , patient was seen on 05/25/15 and Plavix was not addressed.[place] call or fax [phone]

## 2015-07-29 ENCOUNTER — Ambulatory Visit: Admit: 2015-07-29 | Discharge: 2015-07-29 | Payer: MEDICARE | Attending: Specialist | Primary: Specialist

## 2015-07-29 DIAGNOSIS — I10 Essential (primary) hypertension: Secondary | ICD-10-CM

## 2015-07-29 MED ORDER — TEMAZEPAM 15 MG CAP
15 mg | ORAL_CAPSULE | Freq: Every evening | ORAL | 3 refills | Status: DC
Start: 2015-07-29 — End: 2015-09-11
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
## 2015-08-25 ENCOUNTER — Encounter: Attending: Specialist | Primary: Specialist

## 2015-09-11 ENCOUNTER — Ambulatory Visit: Admit: 2015-09-11 | Discharge: 2015-09-11 | Payer: MEDICARE | Attending: Specialist | Primary: Specialist

## 2015-09-11 DIAGNOSIS — Z0000 Encounter for general adult medical examination without abnormal findings: Secondary | ICD-10-CM

## 2015-09-11 MED ORDER — TEMAZEPAM 15 MG CAP
15 mg | ORAL_CAPSULE | Freq: Every evening | ORAL | 3 refills | Status: DC
Start: 2015-09-11 — End: 2016-01-12

## 2015-09-11 MED ORDER — VALSARTAN 160 MG TAB
160 mg | ORAL_TABLET | Freq: Every day | ORAL | 3 refills | Status: DC
Start: 2015-09-11 — End: 2016-04-14

## 2015-09-11 MED ORDER — DULOXETINE 60 MG CAP, DELAYED RELEASE
60 mg | ORAL_CAPSULE | Freq: Every day | ORAL | 3 refills | Status: DC
Start: 2015-09-11 — End: 2016-01-12

## 2015-09-11 MED ORDER — CLOPIDOGREL 75 MG TAB
75 mg | ORAL_TABLET | Freq: Every day | ORAL | 3 refills | Status: DC
Start: 2015-09-11 — End: 2016-04-14
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
## 2015-09-14 ENCOUNTER — Emergency Department: Admit: 2015-09-15 | Payer: MEDICARE | Primary: Specialist

## 2015-09-14 DIAGNOSIS — K5733 Diverticulitis of large intestine without perforation or abscess with bleeding: Principal | ICD-10-CM

## 2015-09-14 LAB — LIPID PANEL
Cholesterol: 235 — AB (ref 0–200)
HDL: 79 — AB (ref 35–70)
LDL Cholesterol: 121
Triglycerides: 175 — AB (ref 40–160)

## 2015-09-14 NOTE — ED Notes (Signed)
"  I just went to the bathroom and there was blood .  My son has cancer and I just dont know.  My husband dies 15 years ago.  My stomach just aches a little"
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
## 2015-09-14 NOTE — ED Notes (Signed)
Patient's cousin Olegario MessierKathy requests a call at [phone] if patient is to be DCed.

## 2015-09-14 NOTE — ED Triage Notes (Signed)
PT arrived to ED c/o bright red bleeding in her stool. Pt states she has a Hx of polyps in her colon. Pt states she has been having discomfort in her abdomen for the past 3 weeks.

## 2015-09-15 ENCOUNTER — Encounter: Payer: MEDICARE | Primary: Specialist

## 2015-09-15 ENCOUNTER — Inpatient Hospital Stay
Admit: 2015-09-15 | Discharge: 2015-09-16 | Disposition: A | Discharge status: Home / Self Care | Payer: MEDICARE | Attending: Nephrology | Admitting: Nephrology

## 2015-09-15 DIAGNOSIS — K921 Melena: Secondary | ICD-10-CM | POA: Insufficient documentation

## 2015-09-15 DIAGNOSIS — Z7902 Long term (current) use of antithrombotics/antiplatelets: Secondary | ICD-10-CM | POA: Insufficient documentation

## 2015-09-15 DIAGNOSIS — R1032 Left lower quadrant pain: Secondary | ICD-10-CM | POA: Insufficient documentation

## 2015-09-15 LAB — TYPE AND SCREEN
ABO/Rh: O POS
Antibody Screen: NEGATIVE

## 2015-09-15 LAB — METABOLIC PANEL, COMPREHENSIVE
A-G Ratio: 0.8 — ABNORMAL LOW (ref 1.2–3.5)
ALT (SGPT): 18 U/L (ref 12–65)
AST (SGOT): 36 U/L (ref 15–37)
Albumin: 3.3 g/dL (ref 3.2–4.6)
Alk. phosphatase: 110 U/L (ref 50–136)
Anion gap: 8 mmol/L (ref 7–16)
BUN: 24 MG/DL — ABNORMAL HIGH (ref 8–23)
Bilirubin, total: 0.5 MG/DL (ref 0.2–1.1)
CO2: 28 mmol/L (ref 21–32)
Calcium: 8.8 MG/DL (ref 8.3–10.4)
Chloride: 105 mmol/L (ref 98–107)
Creatinine: 1.12 MG/DL — ABNORMAL HIGH (ref 0.6–1.0)
GFR est AA: 60 mL/min/{1.73_m2} — ABNORMAL LOW (ref >60)
GFR est non-AA: 49 mL/min/{1.73_m2} — ABNORMAL LOW (ref >60)
Globulin: 4.1 g/dL — ABNORMAL HIGH (ref 2.3–3.5)
Glucose: 103 mg/dL — ABNORMAL HIGH (ref 65–100)
Potassium: 5.1 mmol/L (ref 3.5–5.1)
Protein, total: 7.4 g/dL (ref 6.3–8.2)
Sodium: 141 mmol/L (ref 136–145)

## 2015-09-15 LAB — CBC WITH AUTOMATED DIFF
ABS. BASOPHILS: 0 10*3/uL (ref 0.0–0.2)
ABS. BASOPHILS: 0 10*3/uL (ref 0.0–0.2)
ABS. EOSINOPHILS: 0.2 10*3/uL (ref 0.0–0.8)
ABS. EOSINOPHILS: 0.1 10*3/uL (ref 0.0–0.8)
ABS. IMM. GRANS.: 0 10*3/uL (ref 0.0–0.5)
ABS. IMM. GRANS.: 0 10*3/uL (ref 0.0–0.5)
ABS. LYMPHOCYTES: 1.4 10*3/uL (ref 0.5–4.6)
ABS. LYMPHOCYTES: 1 10*3/uL (ref 0.5–4.6)
ABS. MONOCYTES: 0.5 10*3/uL (ref 0.1–1.3)
ABS. MONOCYTES: 0.5 10*3/uL (ref 0.1–1.3)
ABS. NEUTROPHILS: 3.1 10*3/uL (ref 1.7–8.2)
ABS. NEUTROPHILS: 2.6 10*3/uL (ref 1.7–8.2)
BASOPHILS: 0 % (ref 0.0–2.0)
BASOPHILS: 1 % (ref 0.0–2.0)
EOSINOPHILS: 3 % (ref 0.5–7.8)
EOSINOPHILS: 3 % (ref 0.5–7.8)
HCT: 32.9 % — ABNORMAL LOW (ref 35.8–46.3)
HCT: 31.6 % — ABNORMAL LOW (ref 35.8–46.3)
HGB: 11.1 g/dL — ABNORMAL LOW (ref 11.7–15.4)
HGB: 10.3 g/dL — ABNORMAL LOW (ref 11.7–15.4)
IMMATURE GRANULOCYTES: 0.4 % (ref 0.0–5.0)
IMMATURE GRANULOCYTES: 0.2 % (ref 0.0–5.0)
LYMPHOCYTES: 26 % (ref 13–44)
LYMPHOCYTES: 24 % (ref 13–44)
MCH: 31.6 PG (ref 26.1–32.9)
MCH: 30.5 PG (ref 26.1–32.9)
MCHC: 33.7 g/dL (ref 31.4–35.0)
MCHC: 32.6 g/dL (ref 31.4–35.0)
MCV: 93.7 FL (ref 79.6–97.8)
MCV: 93.5 FL (ref 79.6–97.8)
MONOCYTES: 9 % (ref 4.0–12.0)
MONOCYTES: 11 % (ref 4.0–12.0)
MPV: 11.1 FL (ref 10.8–14.1)
MPV: 10.5 FL — ABNORMAL LOW (ref 10.8–14.1)
NEUTROPHILS: 62 % (ref 43–78)
NEUTROPHILS: 61 % (ref 43–78)
PLATELET: 191 10*3/uL (ref 150–450)
PLATELET: 159 10*3/uL (ref 150–450)
RBC: 3.51 M/uL — ABNORMAL LOW (ref 4.05–5.25)
RBC: 3.38 M/uL — ABNORMAL LOW (ref 4.05–5.25)
RDW: 13.8 % (ref 11.9–14.6)
RDW: 13.7 % (ref 11.9–14.6)
WBC: 5.1 10*3/uL (ref 4.3–11.1)
WBC: 4.2 10*3/uL — ABNORMAL LOW (ref 4.3–11.1)

## 2015-09-15 LAB — TYPE & SCREEN
ABO/Rh(D): O POS
Antibody screen: NEGATIVE

## 2015-09-15 LAB — HGB & HCT
HCT: 32 % — ABNORMAL LOW (ref 35.8–46.3)
HGB: 10.6 g/dL — ABNORMAL LOW (ref 11.7–15.4)

## 2015-09-15 LAB — URINE MICROSCOPIC
Casts: 0 /lpf
Crystals, urine: 0 /LPF
Mucus: 0 /lpf
RBC: 0 /hpf

## 2015-09-15 LAB — IRON: Iron: 59 ug/dL (ref 35–150)

## 2015-09-15 LAB — PROTHROMBIN TIME + INR
INR: 0.9 (ref 0.9–1.2)
Prothrombin time: 10 s (ref 9.6–12.0)

## 2015-09-15 MED ORDER — UMECLIDINIUM 62.5 MCG-VILANTEROL 25 MCG/ACTUATION POWDR FOR INHALATION
Freq: Every day | RESPIRATORY_TRACT | Status: DC
Start: 2015-09-15 — End: 2015-09-15
  Administered 2015-09-15: 13:00:00 via RESPIRATORY_TRACT

## 2015-09-15 MED ORDER — LACTOBACILLUS ACIDOPH & BULGAR 1 MILLION CELL TAB
1 million cell | Freq: Every day | ORAL | Status: DC
Start: 2015-09-15 — End: 2015-09-16
  Administered 2015-09-15 – 2015-09-16 (×2): via ORAL

## 2015-09-15 MED ORDER — ALBUTEROL SULFATE 0.083 % (0.83 MG/ML) SOLN FOR INHALATION
2.5 mg /3 mL (0.083 %) | Freq: Four times a day (QID) | RESPIRATORY_TRACT | Status: DC
Start: 2015-09-15 — End: 2015-09-16
  Administered 2015-09-15 – 2015-09-16 (×3): via RESPIRATORY_TRACT

## 2015-09-15 MED ORDER — METRONIDAZOLE IN SODIUM CHLORIDE (ISO-OSM) 500 MG/100 ML IV PIGGY BACK
500 mg/100 mL | Freq: Three times a day (TID) | INTRAVENOUS | Status: DC
Start: 2015-09-15 — End: 2015-09-15

## 2015-09-15 MED ORDER — TIOTROPIUM BROMIDE 18 MCG CAPS WITH INHALATION DEVICE
18 mcg | Freq: Every day | RESPIRATORY_TRACT | Status: DC
Start: 2015-09-15 — End: 2015-09-16
  Administered 2015-09-16 (×2): via RESPIRATORY_TRACT

## 2015-09-15 MED ORDER — IOPAMIDOL 76 % IV SOLN
370 mg iodine /mL (76 %) | Freq: Once | INTRAVENOUS | Status: AC
Start: 2015-09-15 — End: 2015-09-14
  Administered 2015-09-15: 03:00:00 via INTRAVENOUS

## 2015-09-15 MED ORDER — SODIUM CHLORIDE 0.9 % IJ SYRG
Freq: Three times a day (TID) | INTRAMUSCULAR | Status: DC
Start: 2015-09-15 — End: 2015-09-16
  Administered 2015-09-15 – 2015-09-16 (×2): via INTRAVENOUS

## 2015-09-15 MED ORDER — ZOLPIDEM 5 MG TAB
5 mg | Freq: Every evening | ORAL | Status: DC | PRN
Start: 2015-09-15 — End: 2015-09-16
  Administered 2015-09-15: 07:00:00 via ORAL

## 2015-09-15 MED ORDER — SODIUM CHLORIDE 0.9% BOLUS IV
0.9 % | Freq: Once | INTRAVENOUS | Status: AC
Start: 2015-09-15 — End: 2015-09-15
  Administered 2015-09-15: 03:00:00 via INTRAVENOUS

## 2015-09-15 MED ORDER — ALBUTEROL SULFATE HFA 90 MCG/ACTUATION AEROSOL INHALER
90 mcg/actuation | RESPIRATORY_TRACT | Status: DC | PRN
Start: 2015-09-15 — End: 2015-09-16

## 2015-09-15 MED ORDER — LORAZEPAM 1 MG TAB
1 mg | ORAL | Status: DC | PRN
Start: 2015-09-15 — End: 2015-09-16
  Administered 2015-09-15: 22:00:00 via ORAL

## 2015-09-15 MED ORDER — SODIUM CHLORIDE 0.9 % IJ SYRG
INTRAMUSCULAR | Status: DC | PRN
Start: 2015-09-15 — End: 2015-09-16

## 2015-09-15 MED ORDER — LEVOFLOXACIN IN D5W 750 MG/150 ML IV PIGGY BACK
750 mg/150 mL | Freq: Every day | INTRAVENOUS | Status: DC
Start: 2015-09-15 — End: 2015-09-15
  Administered 2015-09-15: 14:00:00 via INTRAVENOUS

## 2015-09-15 MED ORDER — ONDANSETRON (PF) 4 MG/2 ML INJECTION
4 mg/2 mL | INTRAMUSCULAR | Status: DC | PRN
Start: 2015-09-15 — End: 2015-09-16

## 2015-09-15 MED ORDER — NALOXONE 0.4 MG/ML INJECTION
0.4 mg/mL | INTRAMUSCULAR | Status: DC | PRN
Start: 2015-09-15 — End: 2015-09-16

## 2015-09-15 MED ORDER — METRONIDAZOLE IN SODIUM CHLORIDE (ISO-OSM) 500 MG/100 ML IV PIGGY BACK
500 mg/100 mL | Freq: Two times a day (BID) | INTRAVENOUS | Status: DC
Start: 2015-09-15 — End: 2015-09-15

## 2015-09-15 MED ORDER — METRONIDAZOLE 500 MG TAB
500 mg | Freq: Three times a day (TID) | ORAL | Status: DC
Start: 2015-09-15 — End: 2015-09-16
  Administered 2015-09-15 – 2015-09-16 (×4): via ORAL

## 2015-09-15 MED ORDER — SODIUM CHLORIDE 0.9 % IV
INTRAVENOUS | Status: DC
Start: 2015-09-15 — End: 2015-09-16
  Administered 2015-09-15 – 2015-09-16 (×2): via INTRAVENOUS

## 2015-09-15 MED ORDER — DIPHENHYDRAMINE HCL 50 MG/ML IJ SOLN
50 mg/mL | INTRAMUSCULAR | Status: DC | PRN
Start: 2015-09-15 — End: 2015-09-16

## 2015-09-15 MED ORDER — METRONIDAZOLE IN SODIUM CHLORIDE (ISO-OSM) 500 MG/100 ML IV PIGGY BACK
500 mg/100 mL | INTRAVENOUS | Status: AC
Start: 2015-09-15 — End: 2015-09-15
  Administered 2015-09-15: 05:00:00 via INTRAVENOUS

## 2015-09-15 MED ORDER — SALINE PERIPHERAL FLUSH PRN
Freq: Once | INTRAMUSCULAR | Status: AC
Start: 2015-09-15 — End: 2015-09-14
  Administered 2015-09-15: 03:00:00

## 2015-09-15 MED ORDER — LEVOFLOXACIN IN D5W 750 MG/150 ML IV PIGGY BACK
750 mg/150 mL | INTRAVENOUS | Status: DC
Start: 2015-09-15 — End: 2015-09-16

## 2015-09-15 MED FILL — METRONIDAZOLE IN SODIUM CHLORIDE (ISO-OSM) 500 MG/100 ML IV PIGGY BACK: 500 mg/100 mL | INTRAVENOUS | Qty: 100

## 2015-09-15 MED FILL — ALBUTEROL SULFATE 0.083 % (0.83 MG/ML) SOLN FOR INHALATION: 2.5 mg /3 mL (0.083 %) | RESPIRATORY_TRACT | Qty: 1

## 2015-09-15 MED FILL — METRONIDAZOLE 500 MG TAB: 500 mg | ORAL | Qty: 1

## 2015-09-15 MED FILL — SODIUM CHLORIDE 0.9 % IV: INTRAVENOUS | Qty: 1000

## 2015-09-15 MED FILL — LEVOFLOXACIN IN D5W 750 MG/150 ML IV PIGGY BACK: 750 mg/150 mL | INTRAVENOUS | Qty: 150

## 2015-09-15 MED FILL — ZOLPIDEM 5 MG TAB: 5 mg | ORAL | Qty: 1

## 2015-09-15 MED FILL — LORAZEPAM 1 MG TAB: 1 mg | ORAL | Qty: 1

## 2015-09-15 MED FILL — FLORANEX 1 MILLION CELL TABLET: 1 million cell | ORAL | Qty: 2

## 2015-09-15 NOTE — Progress Notes (Signed)
Problem: Falls - Risk of  Goal: *Absence of Falls  Document Schmid Fall Risk and appropriate interventions in the flowsheet.   Outcome: Progressing Towards Goal  Fall Risk Interventions:  Mobility Interventions: Patient to call before getting OOB           Medication Interventions: Bed/chair exit alarm, Patient to call before getting OOB     Elimination Interventions: Call light in reach, Patient to call for help with toileting needs     History of Falls Interventions: Door open when patient unattended, Evaluate medications/consider consulting pharmacy
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
## 2015-09-15 NOTE — Progress Notes (Signed)
QUICK NOTE on ADMIT Day:  Patient seen on admit day. Has Lower abdominal pain.  Patient seen by GI. No plans to scope in immediate future. Outpatient GI f/u in 6-8 weeks for colonoscopy.  - clear diet, advance as tolerated  - Abx levaquin IV and flagyl PO for diverticulitis  - dispo pending tolerating diet with minimal pain

## 2015-09-15 NOTE — ED Notes (Signed)
Resting quietly with eyes closed.   No distress noted.
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
## 2015-09-15 NOTE — ED Notes (Signed)
Pt ambulated to restroom with nurse assistance.  Pt back in bed with family at bedside.

## 2015-09-15 NOTE — Progress Notes (Signed)
Primary Nurse Lysle RubensJessica E Ayres and Carolin GuernseyF. Perkins, RN performed a dual skin assessment on this patient. Ecchymosis on patients R knee r t fall a few weeks ago (per patient). All other skin CDI  Braden score is 21

## 2015-09-15 NOTE — Other (Signed)
TRANSFER - OUT REPORT:    Verbal report given to receiving RN on Kelli ChurnDorothy M Scherman  being transferred to room 219 for routine progression of care       Report consisted of patient???s Situation, Background, Assessment and   Recommendations(SBAR).     Information from the following report(s) SBAR, ED Summary and Recent Results was reviewed with the receiving nurse.    Lines:   Peripheral IV 09/15/15 Right Antecubital (Active)   Site Assessment Clean, dry, & intact 09/15/2015 11:10 AM   Phlebitis Assessment 0 09/15/2015 11:10 AM   Infiltration Assessment 0 09/15/2015 11:10 AM   Dressing Status Clean, dry, & intact 09/15/2015 11:10 AM        Opportunity for questions and clarification was provided.      Patient transported with:   [employer]

## 2015-09-15 NOTE — Progress Notes (Signed)
TRANSFER - IN REPORT:    Verbal report received from Jaymie RN(name) on Natalie Moon  being received from ED(unit) for routine progression of care      Report consisted of patient???s Situation, Background, Assessment and   Recommendations(SBAR).     Information from the following report(s) SBAR, Kardex, ED Summary, [facility] and Recent Results was reviewed with the receiving nurse.    Opportunity for questions and clarification was provided.      Assessment completed upon patient???s arrival to unit and care assumed.

## 2015-09-15 NOTE — ED Notes (Signed)
Dr. Salley HewsFyock at pt bedside.
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
## 2015-09-16 LAB — CBC WITH AUTOMATED DIFF
ABS. BASOPHILS: 0 10*3/uL (ref 0.0–0.2)
ABS. EOSINOPHILS: 0.1 10*3/uL (ref 0.0–0.8)
ABS. IMM. GRANS.: 0 10*3/uL (ref 0.0–0.5)
ABS. LYMPHOCYTES: 0.6 10*3/uL (ref 0.5–4.6)
ABS. MONOCYTES: 0.4 10*3/uL (ref 0.1–1.3)
ABS. NEUTROPHILS: 3 10*3/uL (ref 1.7–8.2)
BASOPHILS: 1 % (ref 0.0–2.0)
EOSINOPHILS: 2 % (ref 0.5–7.8)
HCT: 31.8 % — ABNORMAL LOW (ref 35.8–46.3)
HGB: 10.4 g/dL — ABNORMAL LOW (ref 11.7–15.4)
IMMATURE GRANULOCYTES: 0 % (ref 0.0–5.0)
LYMPHOCYTES: 14 % (ref 13–44)
MCH: 30.6 PG (ref 26.1–32.9)
MCHC: 32.7 g/dL (ref 31.4–35.0)
MCV: 93.5 FL (ref 79.6–97.8)
MONOCYTES: 11 % (ref 4.0–12.0)
MPV: 10.5 FL — ABNORMAL LOW (ref 10.8–14.1)
NEUTROPHILS: 72 % (ref 43–78)
PLATELET: 183 10*3/uL (ref 150–450)
RBC: 3.4 M/uL — ABNORMAL LOW (ref 4.05–5.25)
RDW: 13.9 % (ref 11.9–14.6)
WBC: 4.2 10*3/uL — ABNORMAL LOW (ref 4.3–11.1)

## 2015-09-16 LAB — METABOLIC PANEL, BASIC
Anion gap: 6 mmol/L — ABNORMAL LOW (ref 7–16)
BUN: 13 MG/DL (ref 8–23)
CO2: 27 mmol/L (ref 21–32)
Calcium: 8.3 MG/DL (ref 8.3–10.4)
Chloride: 112 mmol/L — ABNORMAL HIGH (ref 98–107)
Creatinine: 0.99 MG/DL (ref 0.6–1.0)
GFR est AA: >60 mL/min/{1.73_m2} (ref >60)
GFR est non-AA: 57 mL/min/{1.73_m2} — ABNORMAL LOW (ref >60)
Glucose: 100 mg/dL (ref 65–100)
Potassium: 4.1 mmol/L (ref 3.5–5.1)
Sodium: 145 mmol/L (ref 136–145)

## 2015-09-16 MED ORDER — LACTOBACILLUS ACIDOPH & BULGAR 1 MILLION CELL TAB
1 million cell | ORAL_TABLET | Freq: Every day | ORAL | 1 refills | Status: DC
Start: 2015-09-16 — End: 2016-01-12

## 2015-09-16 MED ORDER — CIPROFLOXACIN 500 MG TAB
500 mg | ORAL_TABLET | Freq: Two times a day (BID) | ORAL | 0 refills | Status: DC
Start: 2015-09-16 — End: 2015-11-12

## 2015-09-16 MED ORDER — METRONIDAZOLE 500 MG TAB
500 mg | ORAL_TABLET | Freq: Three times a day (TID) | ORAL | 0 refills | Status: DC
Start: 2015-09-16 — End: 2015-11-12

## 2015-09-16 MED ORDER — CIPROFLOXACIN 500 MG TAB
500 mg | Freq: Two times a day (BID) | ORAL | Status: DC
Start: 2015-09-16 — End: 2015-09-16
  Administered 2015-09-16: 14:00:00 via ORAL

## 2015-09-16 MED FILL — ALBUTEROL SULFATE 0.083 % (0.83 MG/ML) SOLN FOR INHALATION: 2.5 mg /3 mL (0.083 %) | RESPIRATORY_TRACT | Qty: 1

## 2015-09-16 MED FILL — METRONIDAZOLE 500 MG TAB: 500 mg | ORAL | Qty: 1

## 2015-09-16 MED FILL — FLORANEX 1 MILLION CELL TABLET: 1 million cell | ORAL | Qty: 2

## 2015-09-16 MED FILL — CIPROFLOXACIN 500 MG TAB: 500 mg | ORAL | Qty: 1

## 2015-09-16 MED FILL — SPIRIVA WITH HANDIHALER 18 MCG AND INHALATION CAPSULES: 18 mcg | RESPIRATORY_TRACT | Qty: 5

## 2015-09-16 NOTE — Progress Notes (Signed)
GI DAILY PROGRESS NOTE    Admit Date:  09/14/2015    Today's Date:  09/16/2015    CC:  Rectal bleeding    Subjective:     Patient denies passing any blood per rectum overnight. She is asking to go home today. Reports some abdominal discomfort. Denies any nausea/vomiting. Tolerated clears yesterday.     Medications:   Current Facility-Administered Medications   Medication Dose Route Frequency   ??? sodium chloride (NS) flush 5-10 mL  5-10 mL IntraVENous Q8H   ??? sodium chloride (NS) flush 5-10 mL  5-10 mL IntraVENous PRN   ??? naloxone (NARCAN) injection 0.4 mg  0.4 mg IntraVENous PRN   ??? diphenhydrAMINE (BENADRYL) injection 12.5 mg  12.5 mg IntraVENous Q4H PRN   ??? ondansetron (ZOFRAN) injection 4 mg  4 mg IntraVENous Q4H PRN   ??? LORazepam (ATIVAN) tablet 1 mg  1 mg Oral Q4H PRN   ??? zolpidem (AMBIEN) tablet 5 mg  5 mg Oral QHS PRN   ??? 0.9% sodium chloride infusion  75 mL/hr IntraVENous CONTINUOUS   ??? albuterol (PROVENTIL HFA, VENTOLIN HFA, PROAIR HFA) inhaler 2 Puff  2 Puff Inhalation Q4H PRN   ??? Lactobacillus Acidoph & Bulgar (FLORANEX) tablet 2 Tab  2 Tab Oral DAILY   ??? metroNIDAZOLE (FLAGYL) tablet 500 mg  500 mg Oral TID   ??? tiotropium (SPIRIVA) inhalation capsule 18 mcg  1 Cap Inhalation DAILY    And   ??? albuterol (PROVENTIL VENTOLIN) nebulizer solution 2.5 mg  2.5 mg Nebulization Q6H RT   ??? [START ON 09/17/2015] levoFLOXacin (LEVAQUIN) 750 mg in D5W IVPB  750 mg IntraVENous Q48H       Review of Systems:  ROS was obtained, with pertinent positives as listed above.  No chest pain or SOB.    Diet: NPO     Objective:   Vitals:  Visit Vitals   ??? BP 131/64   ??? Pulse 74   ??? Temp 98.2 ??F (36.8 ??C)   ??? Resp 17   ??? Ht 5\' 3"  (1.6 m)   ??? Wt 81.6 kg (180 lb)   ??? SpO2 96%   ??? Breastfeeding No   ??? BMI 31.89 kg/m2     Intake/Output:  08/16 0701 - 08/16 1900  In: 151 [I.V.:151]  Out: -   08/14 1901 - 08/16 0700  In: 1003 [I.V.:1003]  Out: 300 [Urine:300]  Exam:  General appearance: alert, cooperative, no distress   Lungs: clear to auscultation bilaterally anteriorly  Heart: regular rate and rhythm  Abdomen: soft, non-tender. Bowel sounds normal. No masses, no organomegaly  Extremities: extremities normal, atraumatic, no cyanosis or edema  Neuro:  alert and oriented    Data Review (Labs):    Recent Labs      09/16/15   0622  09/15/15   1604  09/15/15   0438  09/14/15   2115   WBC  4.2*   --   4.2*  5.1   HGB  10.4*  10.6*  10.3*  11.1*   HCT  31.8*  32.0*  31.6*  32.9*   PLT  183   --   159  191   MCV  93.5   --   93.5  93.7   NA   --    --    --   141   K   --    --    --   5.1   CL   --    --    --  105   CO2   --    --    --   28   BUN   --    --    --   24*   CREA   --    --    --   1.12*   CA   --    --    --   8.8   GLU   --    --    --   103*   AP   --    --    --   110   SGOT   --    --    --   36   ALT   --    --    --   18   TBILI   --    --    --   0.5   ALB   --    --    --   3.3   TP   --    --    --   7.4   PTP   --    --    --   10.0   INR   --    --    --   0.9     CT of A/P 09/14/15  IMPRESSION  IMPRESSION:  ??  1. Sigmoid diverticulosis. Minimal fat stranding adjacent to the sigmoid colon,  raising the question of mild diverticulitis. No perforation or abscess.  ??  2. Moderate stool volume in the colon. Hiatal hernia. No bowel obstruction.  ??  3. No hydronephrosis.    Assessment:     Principal Problem:    Left lower quadrant pain (09/15/2015)    Active Problems:    Bloody stool (09/15/2015)      Long term (current) use of antithrombotics/antiplatelets (09/15/2015)      Personal history of colon cancer (09/15/2015)    82 yo female with personal hx of colon cancer is seen today in consultation for evaluation of a 3 day hx of diffuse abdominal pain with red rectal bleeding which began last evening.  Hgb has dropped one gram overnight and she reports one additional stool with blood since arrival to the hospital.  CT scan was suggestive of a mild sigmoid diverticulitis and  she has been placed on Flagyl.  Plavix is on hold.  Last colonoscopy was in 2013 with repeat exam recommended for 3 years.  ??  09/16/15: HGB is stable at 10.4 compared to 10.6 on 8/15.  Plan:   1. We have arranged for outpatient colonoscopy in 6-8 weeks with Dr. Jonell Cluckickoff, her primary GI.   2. 7 days of antibiotics for diverticulitis.  3. Discharge soon    Horald Polleniffany N Tywan Siever, NP    Patient is seen and examined in collaboration with Dr. Salley HewsFyock  Assessment and plan as per Dr. Salley HewsFyock.

## 2015-09-16 NOTE — Progress Notes (Signed)
END OF SHIFT NOTE:    INTAKE/OUTPUT  08/15 0701 - 08/16 0700  In: 1003 [I.V.:1003]  Out: 300 [Urine:300]  Voiding: YES  Catheter: NO  Drain:              Flatus: Patient does have flatus present.    Stool:  0 occurrences.    Characteristics:  Stool Assessment  Stool Appearance: Bloody  Stool Amount: Small    Emesis: 0 occurrences.    Characteristics:        VITAL SIGNS  Patient Vitals for the past 12 hrs:   Temp Pulse Resp BP SpO2   09/16/15 0400 98.1 ??F (36.7 ??C) 65 18 138/64 95 %   09/16/15 0000 97.8 ??F (36.6 ??C) 78 18 134/62 93 %   09/15/15 2000 98.1 ??F (36.7 ??C) 83 18 145/68 92 %   09/15/15 1952 - - - - 94 %       Pain Assessment  Pain Intensity 1: 5 (09/15/15 1315)  Pain Location 1: Abdomen  Pain Intervention(s) 1: Declines, Rest, Emotional support  Patient Stated Pain Goal: 0    Ambulating  Yes    Shift report will be given to oncoming nurse at the bedside.    Elsie Saashristine A Tull, RN

## 2015-09-16 NOTE — Telephone Encounter (Signed)
Cardiac Clearance      Surgical, Procedural, or Medication Clearance      Physician or Practice Requesting Clearance: Dr. Carola Frostickogg  Contact Person: Gabriel EaringNicole  Contact Phone Number: [phone]  Fax Number: [phone]  Date of Surgery/Procedure: TBD  Type of Surgery or Procedure:  colonoscopy  Type of Anesthesia: unknown   Medication to Hold: plavix  Days to Hold: 5 days prior and up to 14 post

## 2015-09-16 NOTE — Telephone Encounter (Signed)
OK to hold plavix as outlined for procedure .  resume as as safe post

## 2015-09-16 NOTE — Progress Notes (Signed)
Discharge instructions and prescriptions given and reviewed with pt, verbalizes understanding, pt discharged home with family.

## 2015-09-16 NOTE — Discharge Summary (Signed)
Hospitalist Discharge Summary     Patient ID:  Natalie Moon  [id number]  82 y.o.  [birth year]  Admit date: 09/14/2015  9:07 PM  Discharge date and time: 09/16/2015  Attending: Jacinto Halim, MD  PCP:  Stevie Kern, MD  Treatment Team: Attending Provider: Jacinto Halim, MD; Consulting Provider: Shon Millet, MD; Utilization Review: Camille Bal, RN; Care Manager: Jeannie Fend, RN    Principal Diagnosis Left lower quadrant pain   Principal Problem:    Left lower quadrant pain (09/15/2015)    Active Problems:    Bloody stool (09/15/2015)      Long term (current) use of antithrombotics/antiplatelets (09/15/2015)      Personal history of colon cancer (09/15/2015)         From H&P:  "82 y.o. female who presents with bloody stool x 1 episode. She reports this is her first instance of same. She has an extensive history of abdominal surgery, but had been without recent problems. She has underwent colonoscopy procedure before. She takes Plavix, but hadn't had any issues. She is accompanied by her female friend in ED at time of exam."    Hospital Course:  The patient was admitted to the floor and started on IV Levaquin and Flagyl. Plavix was held. GI evaluated the patient and recommend monitoring and an outpatient colonoscopy in 6-8 weeks once the diverticulitis was resolved. The patient had not more bloody stools and felt good. At that point, she was discharged in stable condition to complete 7 days of antibiotics.    Significant Diagnostic Studies:       Labs: Results:       Chemistry Recent Labs      09/16/15   0622  09/14/15   2115   GLU  100  103*   NA  145  141   K  4.1  5.1   CL  112*  105   CO2  27  28   BUN  13  24*   CREA  0.99  1.12*   CA  8.3  8.8   AGAP  6*  8   AP   --   110   TP   --   7.4   ALB   --   3.3   GLOB   --   4.1*   AGRAT   --   0.8*      CBC w/Diff Recent Labs      09/16/15   0622  09/15/15   1604  09/15/15   0438  09/14/15   2115   WBC  4.2*   --   4.2*  5.1    RBC  3.40*   --   3.38*  3.51*   HGB  10.4*  10.6*  10.3*  11.1*   HCT  31.8*  32.0*  31.6*  32.9*   PLT  183   --   159  191   GRANS  72   --   61  62   LYMPH  14   --   24  26   EOS  2   --   3  3      Cardiac Enzymes No results for input(s): CPK, CKND1, MYO in the last 72 hours.    No lab exists for component: CKRMB, TROIP   Coagulation Recent Labs      09/14/15   2115   PTP  10.0   INR  0.9  Lipid Panel Lab Results   Component Value Date/Time    Cholesterol, total 235 12/05/2014 01:37 PM    HDL Cholesterol 79 12/05/2014 01:37 PM    LDL, calculated 121 12/05/2014 01:37 PM    VLDL, calculated 35 12/05/2014 01:37 PM    Triglyceride 175 12/05/2014 01:37 PM      BNP No results for input(s): BNPP in the last 72 hours.   Liver Enzymes Recent Labs      09/14/15   2115   TP  7.4   ALB  3.3   AP  110   SGOT  36      Thyroid Studies Lab Results   Component Value Date/Time    T4, Total 5.9 10/11/2013 10:04 AM    TSH 1.700 10/11/2013 10:04 AM            Discharge Exam:  Visit Vitals   ??? BP 131/64   ??? Pulse 74   ??? Temp 98.2 ??F (36.8 ??C)   ??? Resp 17   ??? Ht 5\' 3"  (1.6 m)   ??? Wt 81.6 kg (180 lb)   ??? SpO2 96%   ??? Breastfeeding No   ??? BMI 31.89 kg/m2     General appearance: alert, cooperative, no distress, appears stated age  Lungs: clear to auscultation bilaterally  Heart: regular rate and rhythm, S1, S2 normal, no murmur, click, rub or gallop  Abdomen: soft, non-tender. Bowel sounds normal. No masses,  no organomegaly  Extremities: no cyanosis or edema  Neurologic: Grossly normal    Disposition: home  Discharge Condition: stable  Patient Instructions:   Current Discharge Medication List      START taking these medications    Details   ciprofloxacin HCl (CIPRO) 500 mg tablet Take 1 Tab by mouth every twelve (12) hours.  Qty: 12 Tab, Refills: 0      Lactobacillus Acidoph & Bulgar (FLORANEX) 1 million cell tab tablet Take 2 Tabs by mouth daily.  Qty: 60 Tab, Refills: 1       metroNIDAZOLE (FLAGYL) 500 mg tablet Take 1 Tab by mouth three (3) times daily.  Qty: 18 Tab, Refills: 0         CONTINUE these medications which have NOT CHANGED    Details   temazepam (RESTORIL) 15 mg capsule Take 1 Cap by mouth nightly. Max Daily Amount: 15 mg.  Qty: 30 Cap, Refills: 3    Comments: Not to exceed 3 additional fills before 10/30/2013      valsartan (DIOVAN) 160 mg tablet Take 1 Tab by mouth daily.  Qty: 90 Tab, Refills: 3      clopidogrel (PLAVIX) 75 mg tab Take 1 Tab by mouth daily.  Qty: 90 Tab, Refills: 3    Associated Diagnoses: Coronary artery disease involving native coronary artery of native heart without angina pectoris      DULoxetine (CYMBALTA) 60 mg capsule Take 1 Cap by mouth daily.  Qty: 90 Cap, Refills: 3      celecoxib (CELEBREX) 200 mg capsule Take 200 mg by mouth daily.      nitroglycerin (NITROSTAT) 0.4 mg SL tablet 1 Tab by SubLINGual route every five (5) minutes as needed for Chest Pain.  Qty: 100 Tab, Refills: prn      ferrous fumarate (HEMOCYTE) 324 mg (106 mg iron) tab Take 1 Each by mouth daily (after dinner).  Qty: 90 Tab, Refills: 4      umeclidinium-vilanterol (ANORO ELLIPTA) 62.5-25 mcg/actuation inhaler Take 1 Puff by inhalation daily.  Qty: 1 Inhaler, Refills: 5      albuterol (PROVENTIL HFA, VENTOLIN HFA, PROAIR HFA) 90 mcg/actuation inhaler Take 2 Puffs by inhalation every four (4) hours as needed for Wheezing.  Qty: 1 Inhaler, Refills: 11      levothyroxine (SYNTHROID) 50 mcg tablet TAKE 1 TABLET BY MOUTH EVERY DAY  Qty: 90 Tab, Refills: 3      esomeprazole (NEXIUM) 20 mg capsule Take  by mouth daily.             Activity: Activity as tolerated  Diet: Cardiac Diet  Wound Care: None needed    Follow-up  ??   Dr. Allena KatzPatel in one week  ?? Dr. Jonell Cluckickoff in 4 weeks  Time spent to discharge patient 35 minutes  Signed:  Cammy CopaWoodson E Myrtha Tonkovich, DO  09/16/2015  9:58 AM

## 2015-09-16 NOTE — Progress Notes (Addendum)
Spoke to [name] in room 219 about discharge planning.  Her son is here to pick her up.  No discharge needs identified.  Previous note about MOON letter and CC 44 was in error.  [name] is inpatient status.

## 2015-09-16 NOTE — Discharge Summary (Signed)
Discharge Summary by Cammy Coparenshaw, Chyrl Elwell E, DO at 09/16/15 [id number]                Author: Cammy Coparenshaw, Hillman Attig E, DO  Service: Internal Medicine  Author Type: Physician       Filed: 09/16/15 1005  Date of Service: 09/16/15 0958  Status: Signed          Editor: Cammy Coparenshaw, Lakeva Hollon E, DO (Physician)                         Hospitalist Discharge Summary        Patient ID:   Natalie Moon   [id number]   82 y.o.   01/17/1930   Admit date: 09/14/2015  9:07 PM   Discharge date and time: 09/16/2015   Attending: Jacinto HalimJonathan W McNeely, MD   PCP:  Stevie KernSudhirkumar C Patel, MD   Treatment Team: Attending Provider: Jacinto HalimJonathan W McNeely, MD; Consulting Provider: Shon Millethristopher J Fyock, MD; Utilization Review: Camille BalSusan M Holliday, RN; Care Manager: Jeannie FendJeffery L Jackson,  RN      Principal Diagnosis Left lower quadrant pain    Principal Problem:     Left lower quadrant pain (09/15/2015)      Active Problems:     Bloody stool (09/15/2015)        Long term (current) use of antithrombotics/antiplatelets (09/15/2015)        Personal history of colon cancer (09/15/2015)             From H&P:   "82 y.o. female who presents with bloody stool x 1 episode. She reports this is her first instance of same. She has an extensive history of abdominal surgery, but had been without recent problems.  She has underwent colonoscopy procedure before. She takes Plavix, but hadn't had any issues. She is accompanied by her female friend in ED at time of exam."      Hospital Course:   The patient was admitted to the floor and started on IV Levaquin and Flagyl. Plavix was held. GI evaluated the patient and recommend monitoring and an outpatient colonoscopy  in 6-8 weeks once the diverticulitis was resolved. The patient had not more bloody stools and felt good. At that point, she was discharged in stable condition to complete 7 days of antibiotics.      Significant Diagnostic Studies:             Labs:  Results:                  Chemistry  Recent Labs          09/16/15    0622    09/14/15    2115      GLU   100   103*      NA   145   141      K   4.1   5.1      CL   112*   105      CO2   27   28      BUN   13   24*      CREA   0.99   1.12*      CA   8.3   8.8      AGAP   6*   8      AP    --    110      TP    --    7.4  ALB    --    3.3      GLOB    --    4.1*      AGRAT    --    0.8*                 CBC w/Diff  Recent Labs          09/16/15    0622   09/15/15    1604   09/15/15    0438   09/14/15    2115      WBC   4.2*    --    4.2*   5.1      RBC   3.40*    --    3.38*   3.51*      HGB   10.4*   10.6*   10.3*   11.1*      HCT   31.8*   32.0*   31.6*   32.9*      PLT   183    --    159   191      GRANS   72    --    61   62      LYMPH   14    --    24   26      EOS   2    --    3   3                 Cardiac Enzymes  No results for input(s): CPK, CKND1, MYO in the last 72 hours.      No lab exists for component: CKRMB, TROIP        Coagulation  Recent Labs          09/14/15    2115      PTP   10.0      INR   0.9               Lipid Panel  Lab Results      Component  Value  Date/Time        Cholesterol, total  235  12/05/2014 01:37 PM        HDL Cholesterol  79  12/05/2014 01:37 PM        LDL, calculated  121  12/05/2014 01:37 PM        VLDL, calculated  35  12/05/2014 01:37 PM        Triglyceride  175  12/05/2014 01:37 PM              BNP  No results for input(s): BNPP in the last 72 hours.        Liver Enzymes  Recent Labs          09/14/15    2115      TP   7.4      ALB   3.3      AP   110      SGOT   36              Thyroid Studies  Lab Results      Component  Value  Date/Time        T4, Total  5.9  10/11/2013 10:04 AM        TSH  1.700  10/11/2013 10:04 AM  Discharge Exam:     Visit Vitals         ?  BP  131/64     ?  Pulse  74     ?  Temp  98.2 ??F (36.8 ??C)     ?  Resp  17     ?  Ht  5\' 3"  (1.6 m)     ?  Wt  81.6 kg (180 lb)     ?  SpO2  96%     ?  Breastfeeding  No         ?  BMI  31.89 kg/m2        General appearance: alert, cooperative, no distress, appears  stated age   Lungs: clear to auscultation bilaterally   Heart: regular rate and rhythm, S1, S2 normal, no murmur, click, rub or gallop   Abdomen: soft, non-tender. Bowel sounds normal. No masses,  no organomegaly   Extremities: no cyanosis or edema   Neurologic: Grossly normal      Disposition: home   Discharge Condition: stable   Patient Instructions:      Current Discharge Medication List              START taking these medications          Details        ciprofloxacin HCl (CIPRO) 500 mg tablet  Take 1 Tab by mouth every twelve (12) hours.   Qty: 12 Tab, Refills:  0               Lactobacillus Acidoph & Bulgar (FLORANEX) 1 million cell tab tablet  Take 2 Tabs by mouth daily.   Qty: 60 Tab, Refills:  1               metroNIDAZOLE (FLAGYL) 500 mg tablet  Take 1 Tab by mouth three (3) times daily.   Qty: 18 Tab, Refills:  0                     CONTINUE these medications which have NOT CHANGED          Details        temazepam (RESTORIL) 15 mg capsule  Take 1 Cap by mouth nightly. Max Daily Amount: 15 mg.   Qty: 30 Cap, Refills:  3          Comments: Not to exceed 3 additional fills before 10/30/2013               valsartan (DIOVAN) 160 mg tablet  Take 1 Tab by mouth daily.   Qty: 90 Tab, Refills:  3               clopidogrel (PLAVIX) 75 mg tab  Take 1 Tab by mouth daily.   Qty: 90 Tab, Refills:  3          Associated Diagnoses: Coronary artery disease involving native coronary artery of native heart  without angina pectoris               DULoxetine (CYMBALTA) 60 mg capsule  Take 1 Cap by mouth daily.   Qty: 90 Cap, Refills:  3               celecoxib (CELEBREX) 200 mg capsule  Take 200 mg by mouth daily.               nitroglycerin (NITROSTAT) 0.4 mg SL tablet  1 Tab by SubLINGual route every five (5)  minutes as needed for Chest Pain.   Qty: 100 Tab, Refills:  prn               ferrous fumarate (HEMOCYTE) 324 mg (106 mg iron) tab  Take 1 Each by mouth daily (after dinner).   Qty: 90 Tab, Refills:  4                umeclidinium-vilanterol (ANORO ELLIPTA) 62.5-25 mcg/actuation inhaler  Take 1 Puff by inhalation daily.   Qty: 1 Inhaler, Refills:  5               albuterol (PROVENTIL HFA, VENTOLIN HFA, PROAIR HFA) 90 mcg/actuation inhaler  Take 2 Puffs by inhalation every four (4) hours as needed for Wheezing.   Qty: 1 Inhaler, Refills:  11               levothyroxine (SYNTHROID) 50 mcg tablet  TAKE 1 TABLET BY MOUTH EVERY DAY   Qty: 90 Tab, Refills:  3               esomeprazole (NEXIUM) 20 mg capsule  Take  by mouth daily.                         Activity: Activity as tolerated   Diet: Cardiac Diet   Wound Care: None needed      Follow-up   ??    Dr. Allena Katz in one week   ??  Dr. Jonell Cluck in 4 weeks   Time spent to discharge patient 35 minutes   Signed:   Cammy Copa, DO   09/16/2015   9:58 AM

## 2015-09-17 NOTE — Telephone Encounter (Signed)
I called and explained Dr.Cebe's response.

## 2015-09-17 NOTE — Telephone Encounter (Signed)
CALLED PT TO FLU AFTER D/C FROM [facility] FOR BLOOD IN STOOL . PT SAYS SHE IS DOING PRETTY GOOD , STOMACH IS STILL BOTHERING HER BUT SHE IS ON 3 ABX FROM HOSP. AND WILL KEEP FLU APPT ON 23RD

## 2015-09-23 ENCOUNTER — Ambulatory Visit: Admit: 2015-09-23 | Discharge: 2015-09-23 | Payer: MEDICARE | Attending: Specialist | Primary: Specialist

## 2015-09-23 DIAGNOSIS — K5731 Diverticulosis of large intestine without perforation or abscess with bleeding: Secondary | ICD-10-CM

## 2015-09-23 LAB — AMB POC COMPLETE CBC,AUTOMATED ENTER
ABS. GRANS (POC): 3.6 10*3/uL (ref 1.4–6.5)
ABS. LYMPHS (POC): 0.9 10*3/uL — AB (ref 1.2–3.4)
ABS. MONOS (POC): 0.3 10*3/uL (ref 0.1–0.6)
GRANULOCYTES (POC): 75.1 % (ref 42.2–75.2)
HCT (POC): 34.3 % — AB (ref 35–60)
HGB (POC): 11.8 g/dL (ref 11–18)
LYMPHOCYTES (POC): 19.5 % — AB (ref 20.5–51.1)
MCH (POC): 32.4 pg — AB (ref 27–31)
MCHC (POC): 34.5 g/dL (ref 33–37)
MCV (POC): 93.9 fL (ref 80–99.9)
MONOCYTES (POC): 5.4 % (ref 1.7–9.3)
MPV (POC): 8.6 fL (ref 7.8–11)
PLATELET (POC): 216 10*3/uL (ref 150–450)
RBC (POC): 3.65 10*6/uL — AB (ref 4–6)
RDW (POC): 14.1 % — AB (ref 11.6–13.7)
WBC (POC): 4.8 10*3/uL (ref 4.5–10.5)
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
## 2015-09-29 ENCOUNTER — Encounter: Attending: Psychiatry | Primary: Specialist

## 2015-11-04 ENCOUNTER — Inpatient Hospital Stay: Admit: 2015-11-04 | Primary: Specialist

## 2015-11-04 HISTORY — PX: COLONOSCOPY: SHX5424

## 2015-11-12 ENCOUNTER — Ambulatory Visit: Admit: 2015-11-12 | Discharge: 2015-11-12 | Payer: MEDICARE | Attending: Specialist | Primary: Specialist

## 2015-11-12 DIAGNOSIS — I10 Essential (primary) hypertension: Secondary | ICD-10-CM
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
## 2015-12-01 LAB — POCT ERYTHROCYTE SEDIMENTATION RATE, NON-AUTOMATED: Sed Rate: 20

## 2016-01-12 ENCOUNTER — Ambulatory Visit: Admit: 2016-01-12 | Discharge: 2016-01-12 | Payer: MEDICARE | Attending: Specialist | Primary: Specialist

## 2016-01-12 DIAGNOSIS — I10 Essential (primary) hypertension: Secondary | ICD-10-CM

## 2016-01-12 MED ORDER — LACTOBACILLUS ACIDOPH & BULGAR 1 MILLION CELL TAB
1 million cell | ORAL_TABLET | Freq: Every day | ORAL | 1 refills | Status: DC
Start: 2016-01-12 — End: 2016-04-14

## 2016-01-12 MED ORDER — DULOXETINE 60 MG CAP, DELAYED RELEASE
60 mg | ORAL_CAPSULE | Freq: Every day | ORAL | 3 refills | Status: DC
Start: 2016-01-12 — End: 2016-04-14

## 2016-01-12 MED ORDER — TEMAZEPAM 15 MG CAP
15 mg | ORAL_CAPSULE | Freq: Every evening | ORAL | 3 refills | Status: DC
Start: 2016-01-12 — End: 2016-04-14

## 2016-01-12 NOTE — ACP (Advance Care Planning) (Signed)
Advanced Care Planning was discussed with patient.  YES   Patient states that they have a Living Will.  YES  Patient advised to bring a copy of Living Will for Medical Records.  YES  Patient advised on the advantages of having a Living Will and having a copy in their Medical Records.  YES  Patient provided with Advanced Care Planning information.   NO

## 2016-01-12 NOTE — Progress Notes (Signed)
Temazepam Rx left here, I called into [facility]   cv
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
## 2016-02-26 ENCOUNTER — Encounter

## 2016-02-26 MED ORDER — CELECOXIB 200 MG CAP
200 mg | ORAL_CAPSULE | Freq: Every day | ORAL | 0 refills | Status: DC
Start: 2016-02-26 — End: 2016-04-14

## 2016-03-07 MED ORDER — LEVOTHYROXINE 50 MCG TAB
50 mcg | ORAL_TABLET | ORAL | 1 refills | Status: DC
Start: 2016-03-07 — End: 2017-04-25

## 2016-04-14 ENCOUNTER — Ambulatory Visit: Admit: 2016-04-14 | Discharge: 2016-04-14 | Payer: MEDICARE | Attending: Specialist | Primary: Specialist

## 2016-04-14 DIAGNOSIS — F331 Major depressive disorder, recurrent, moderate: Secondary | ICD-10-CM | POA: Insufficient documentation

## 2016-04-14 DIAGNOSIS — I2510 Atherosclerotic heart disease of native coronary artery without angina pectoris: Secondary | ICD-10-CM

## 2016-04-14 LAB — AMB POC URINALYSIS DIP STICK AUTO W/O MICRO
Glucose (UA POC): NEGATIVE mg/dL
Nitrites (UA POC): NEGATIVE
Specific gravity (UA POC): 1.03 (ref 1.001–1.035)
Urobilinogen (UA POC): 0.2 (ref 0.2–1)
pH (UA POC): 5.5 (ref 4.6–8.0)

## 2016-04-14 LAB — AMB POC COMPLETE CBC,AUTOMATED ENTER
ABS. GRANS (POC): 3.8 10*3/uL (ref 1.4–6.5)
ABS. LYMPHS (POC): 1.2 10*3/uL (ref 1.2–3.4)
ABS. MONOS (POC): 0.2 10*3/uL (ref 0.1–0.6)
GRANULOCYTES (POC): 73.7 % (ref 42.2–75.2)
HCT (POC): 36.4 % (ref 35–60)
HGB (POC): 11.7 g/dL (ref 11–18)
LYMPHOCYTES (POC): 23 % (ref 20.5–51.1)
MCH (POC): 30.1 pg (ref 27–31)
MCHC (POC): 32.3 g/dL — AB (ref 33–37)
MCV (POC): 93.4 fL (ref 80–99.9)
MONOCYTES (POC): 3.3 % (ref 1.7–9.3)
MPV (POC): 8.9 fL (ref 7.8–11)
PLATELET (POC): 215 10*3/uL (ref 150–450)
RBC (POC): 3.9 10*6/uL — AB (ref 4–6)
RDW (POC): 15.3 % — AB (ref 11.6–13.7)
WBC (POC): 5.1 10*3/uL (ref 4.5–10.5)

## 2016-04-14 MED ORDER — TEMAZEPAM 15 MG CAP
15 mg | ORAL_CAPSULE | Freq: Every evening | ORAL | 3 refills | Status: DC
Start: 2016-04-14 — End: 2017-04-25

## 2016-04-14 MED ORDER — LACTOBACILLUS ACIDOPH & BULGAR 1 MILLION CELL TAB
1 million cell | ORAL_TABLET | Freq: Every day | ORAL | 1 refills | Status: DC
Start: 2016-04-14 — End: 2016-09-19

## 2016-04-14 MED ORDER — CLOPIDOGREL 75 MG TAB
75 mg | ORAL_TABLET | Freq: Every day | ORAL | 3 refills | Status: DC
Start: 2016-04-14 — End: 2017-03-15

## 2016-04-14 MED ORDER — VALSARTAN 160 MG TAB
160 mg | ORAL_TABLET | Freq: Every day | ORAL | 3 refills | Status: DC
Start: 2016-04-14 — End: 2016-09-13

## 2016-04-14 MED ORDER — DULOXETINE 60 MG CAP, DELAYED RELEASE
60 mg | ORAL_CAPSULE | Freq: Every day | ORAL | 3 refills | Status: DC
Start: 2016-04-14 — End: 2017-04-25

## 2016-04-14 MED ORDER — CELECOXIB 200 MG CAP
200 mg | ORAL_CAPSULE | Freq: Every day | ORAL | 0 refills | Status: DC
Start: 2016-04-14 — End: 2016-08-25
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
## 2016-04-15 LAB — METABOLIC PANEL, COMPREHENSIVE
A-G Ratio: 1.5 (ref 1.2–2.2)
ALT (SGPT): 12 IU/L (ref 0–32)
AST (SGOT): 18 IU/L (ref 0–40)
Albumin: 4.2 g/dL (ref 3.5–4.7)
Alk. phosphatase: 103 IU/L (ref 39–117)
BUN/Creatinine ratio: 14 (ref 12–28)
BUN: 17 mg/dL (ref 8–27)
Bilirubin, total: 0.2 mg/dL (ref 0.0–1.2)
CO2: 23 mmol/L (ref 18–29)
Calcium: 8.7 mg/dL (ref 8.7–10.3)
Chloride: 100 mmol/L (ref 96–106)
Creatinine: 1.25 mg/dL — ABNORMAL HIGH (ref 0.57–1.00)
GFR est AA: 46 mL/min/{1.73_m2} — ABNORMAL LOW (ref >59)
GFR est non-AA: 40 mL/min/{1.73_m2} — ABNORMAL LOW (ref >59)
GLOBULIN, TOTAL: 2.8 g/dL (ref 1.5–4.5)
Glucose: 100 mg/dL — ABNORMAL HIGH (ref 65–99)
Potassium: 4.3 mmol/L (ref 3.5–5.2)
Protein, total: 7 g/dL (ref 6.0–8.5)
Sodium: 140 mmol/L (ref 134–144)

## 2016-04-15 LAB — LIPID PANEL
Cholesterol, total: 240 mg/dL — ABNORMAL HIGH (ref 100–199)
HDL Cholesterol: 85 mg/dL (ref >39)
LDL, calculated: 117 mg/dL — ABNORMAL HIGH (ref 0–99)
Triglyceride: 188 mg/dL — ABNORMAL HIGH (ref 0–149)
VLDL, calculated: 38 mg/dL (ref 5–40)

## 2016-04-15 LAB — TSH 3RD GENERATION: TSH: 3.98 u[IU]/mL (ref 0.450–4.500)

## 2016-04-15 LAB — MAGNESIUM: Magnesium: 2.3 mg/dL (ref 1.6–2.3)

## 2016-04-15 LAB — T4 (THYROXINE): T4, Total: 5.1 ug/dL (ref 4.5–12.0)

## 2016-05-16 ENCOUNTER — Ambulatory Visit: Admit: 2016-05-16 | Discharge: 2016-05-16 | Payer: MEDICARE | Attending: Specialist | Primary: Specialist

## 2016-05-16 DIAGNOSIS — J479 Bronchiectasis, uncomplicated: Secondary | ICD-10-CM | POA: Insufficient documentation

## 2016-05-16 DIAGNOSIS — I10 Essential (primary) hypertension: Secondary | ICD-10-CM

## 2016-05-16 MED ORDER — FERROUS FUMARATE 324 MG (106 MG IRON) TABLET
324 mg (106 mg iron) | ORAL_TABLET | Freq: Every day | ORAL | 4 refills | Status: DC
Start: 2016-05-16 — End: 2017-04-25

## 2016-05-16 MED ORDER — NITROGLYCERIN 0.4 MG SUBLINGUAL TAB
0.4 mg | SUBLINGUAL | 5 refills | Status: AC | PRN
Start: 2016-05-16 — End: ?
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
## 2016-05-25 ENCOUNTER — Ambulatory Visit: Admit: 2016-05-25 | Discharge: 2016-05-25 | Payer: MEDICARE | Attending: Cardiovascular Disease | Primary: Specialist

## 2016-05-25 DIAGNOSIS — I10 Essential (primary) hypertension: Secondary | ICD-10-CM
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
## 2016-08-23 ENCOUNTER — Encounter

## 2016-08-23 LAB — BASIC METABOLIC PANEL
Creatinine: 1.1 (ref 0.5–1.1)
Glucose: 9410
Potassium: 4.8 (ref 3.4–5.3)
Sodium: 141 (ref 137–147)

## 2016-08-23 LAB — HEPATIC FUNCTION PANEL
ALT: 10 (ref 7–35)
AST: 21 (ref 13–35)
Alkaline Phosphatase: 103 (ref 25–125)
Bilirubin, Total: 0.3

## 2016-08-23 LAB — CBC AND DIFFERENTIAL
HCT: 36 (ref 36–46)
Hemoglobin: 11.3 — AB (ref 12.0–16.0)
Neutrophils Absolute: 3
Platelets: 225 (ref 150–399)
WBC: 5

## 2016-08-25 MED ORDER — CELECOXIB 200 MG CAP
200 mg | ORAL_CAPSULE | Freq: Every day | ORAL | 0 refills | Status: DC
Start: 2016-08-25 — End: 2016-09-19

## 2016-09-03 ENCOUNTER — Inpatient Hospital Stay: Admit: 2016-09-03 | Payer: MEDICARE | Attending: Gastroenterology | Primary: Specialist

## 2016-09-03 DIAGNOSIS — R1033 Periumbilical pain: Secondary | ICD-10-CM

## 2016-09-03 LAB — CREATININE, POC
Creatinine (POC): 1.1 mg/dL (ref 0.8–1.5)
GFRAA, POC: >60 mL/min/{1.73_m2} (ref >60)
GFRNA, POC: 50 mL/min/{1.73_m2} — ABNORMAL LOW (ref >60)

## 2016-09-03 MED ORDER — DIATRIZOATE MEGLUMINE & SODIUM 66 %-10 % ORAL SOLN
66-10 % | Freq: Once | ORAL | Status: AC
Start: 2016-09-03 — End: 2016-09-03
  Administered 2016-09-03: 15:00:00 via ORAL

## 2016-09-03 MED ORDER — SALINE PERIPHERAL FLUSH PRN
Freq: Once | INTRAMUSCULAR | Status: AC
Start: 2016-09-03 — End: 2016-09-03
  Administered 2016-09-03: 15:00:00

## 2016-09-03 MED ORDER — IOPAMIDOL 76 % IV SOLN
370 mg iodine /mL (76 %) | Freq: Once | INTRAVENOUS | Status: AC
Start: 2016-09-03 — End: 2016-09-03
  Administered 2016-09-03: 15:00:00 via INTRAVENOUS

## 2016-09-03 MED ORDER — SODIUM CHLORIDE 0.9% BOLUS IV
0.9 % | Freq: Once | INTRAVENOUS | Status: AC
Start: 2016-09-03 — End: 2016-09-03
  Administered 2016-09-03: 15:00:00 via INTRAVENOUS

## 2016-09-12 ENCOUNTER — Encounter: Payer: MEDICARE | Attending: Specialist | Primary: Specialist

## 2016-09-13 MED ORDER — LOSARTAN 100 MG TAB
100 mg | ORAL_TABLET | Freq: Every day | ORAL | 0 refills | Status: DC
Start: 2016-09-13 — End: 2016-09-19

## 2016-09-19 ENCOUNTER — Ambulatory Visit: Admit: 2016-09-19 | Discharge: 2016-09-19 | Payer: MEDICARE | Attending: Specialist | Primary: Specialist

## 2016-09-19 DIAGNOSIS — Z0000 Encounter for general adult medical examination without abnormal findings: Secondary | ICD-10-CM

## 2016-09-19 MED ORDER — LACTOBACILLUS ACIDOPH & BULGAR 1 MILLION CELL TAB
1 million cell | ORAL_TABLET | Freq: Every day | ORAL | 1 refills | Status: DC
Start: 2016-09-19 — End: 2017-04-25

## 2016-09-19 MED ORDER — CELECOXIB 200 MG CAP
200 mg | ORAL_CAPSULE | Freq: Every day | ORAL | 0 refills | Status: DC
Start: 2016-09-19 — End: 2017-02-02

## 2016-09-19 MED ORDER — UMECLIDINIUM 62.5 MCG-VILANTEROL 25 MCG/ACTUATION POWDR FOR INHALATION
Freq: Every day | RESPIRATORY_TRACT | 5 refills | Status: AC
Start: 2016-09-19 — End: ?

## 2016-09-19 MED ORDER — LOSARTAN 100 MG TAB
100 mg | ORAL_TABLET | Freq: Every day | ORAL | 4 refills | Status: DC
Start: 2016-09-19 — End: 2017-11-06

## 2016-09-19 NOTE — Patient Instructions (Signed)
Medicare Wellness Visit, Female     The best way to live healthy is to have a lifestyle where you eat a well-balanced diet, exercise regularly, limit alcohol use, and quit all forms of tobacco/nicotine, if applicable.   Regular preventive services are another way to keep healthy. Preventive services (vaccines, screening tests, monitoring & exams) can help personalize your care plan, which helps you manage your own care. Screening tests can find health problems at the earliest stages, when they are easiest to treat.   [facility] follows the current, evidence-based guidelines published by the [employer] [employer] (USPSTF) when recommending preventive services for our patients. Because we follow these guidelines, sometimes recommendations change over time as research supports it. (For example, mammograms used to be recommended annually. Even though Medicare will still pay for an annual mammogram, the newer guidelines recommend a mammogram every two years for women of average risk.)  Of course, you and your doctor may decide to screen more often for some diseases, based on your risk and your health status.   Preventive services for you include:  - Medicare offers their members a free annual wellness visit, which is time for you and your primary care provider to discuss and plan for your preventive service needs. Take advantage of this benefit every year!  -All adults over the age of 74 should receive the recommended pneumonia vaccines. Current USPSTF guidelines recommend a series of two vaccines for the best pneumonia protection.   -All adults should have a flu vaccine yearly and a tetanus vaccine every 10 years. All adults age 55 and older should receive a shingles vaccine once in their lifetime.    -A bone mass density test is recommended when a woman turns 65 to screen for osteoporosis. This test is only recommended one time, as a screening.  Some providers will use this same test as a disease monitoring tool if you already have osteoporosis.  -All adults age 79-70 who are overweight should have a diabetes screening test once every three years.   -Other screening tests and preventive services for persons with diabetes include: an eye exam to screen for diabetic retinopathy, a kidney function test, a foot exam, and stricter control over your cholesterol.   -Cardiovascular screening for adults with routine risk involves an electrocardiogram (ECG) at intervals determined by your doctor.   -Colorectal cancer screenings should be done for adults age 6-75 with no increased risk factors for colorectal cancer.  There are a number of acceptable methods of screening for this type of cancer. Each test has its own benefits and drawbacks. Discuss with your doctor what is most appropriate for you during your annual wellness visit. The different tests include: colonoscopy (considered the best screening method), a fecal occult blood test, a fecal DNA test, and sigmoidoscopy.  -Breast cancer screenings are recommended every other year for women of normal risk, age 69-74.  -Cervical cancer screenings for women over age 60 are only recommended with certain risk factors.   -All adults born between [birth year] and 1963 should be screened once for Hepatitis C.     Here is a list of your current Health Maintenance items (your personalized list of preventive services) with a due date:  Health Maintenance Due   Topic Date Due   ??? Flu Vaccine  08/31/2016   ??? Annual Well Visit  09/11/2016
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
## 2016-12-01 ENCOUNTER — Ambulatory Visit: Admit: 2016-12-01 | Discharge: 2016-12-01 | Payer: MEDICARE | Attending: Specialist | Primary: Specialist

## 2016-12-01 DIAGNOSIS — T148XXA Other injury of unspecified body region, initial encounter: Secondary | ICD-10-CM

## 2016-12-01 LAB — AMB POC COMPLETE CBC,AUTOMATED ENTER
ABS. GRANS (POC): 3.1 10*3/uL (ref 1.4–6.5)
ABS. LYMPHS (POC): 1.2 10*3/uL (ref 1.2–3.4)
ABS. MONOS (POC): 0.3 10*3/uL (ref 0.1–0.6)
GRANULOCYTES (POC): 68.3 % (ref 42.2–75.2)
HCT (POC): 37.2 % (ref 35–60)
HGB (POC): 11.6 g/dL (ref 11–18)
LYMPHOCYTES (POC): 25.9 % (ref 20.5–51.1)
MCH (POC): 29 pg (ref 27–31)
MCHC (POC): 31.3 g/dL — AB (ref 33–37)
MCV (POC): 92.8 fL (ref 80–99.9)
MONOCYTES (POC): 5.8 % (ref 1.7–9.3)
MPV (POC): 8.6 fL (ref 7.8–11)
PLATELET (POC): 268 10*3/uL (ref 150–450)
RBC (POC): 4.01 10*6/uL (ref 4–6)
RDW (POC): 14.4 % — AB (ref 11.6–13.7)
WBC (POC): 4.5 10*3/uL (ref 4.5–10.5)

## 2016-12-01 LAB — AMB POC URINALYSIS DIP STICK AUTO W/O MICRO
Bilirubin (UA POC): NEGATIVE
Glucose (UA POC): NEGATIVE mg/dL
Ketones (UA POC): NEGATIVE
Nitrites (UA POC): NEGATIVE
Protein (UA POC): NEGATIVE
Specific gravity (UA POC): 1.015 (ref 1.001–1.035)
Urobilinogen (UA POC): 0.2 (ref 0.2–1)
pH (UA POC): 5.5 (ref 4.6–8.0)
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
## 2016-12-02 LAB — MAGNESIUM: Magnesium: 2.2 mg/dL (ref 1.6–2.3)

## 2016-12-02 LAB — METABOLIC PANEL, COMPREHENSIVE
A-G Ratio: 1.3 (ref 1.2–2.2)
ALT (SGPT): 11 IU/L (ref 0–32)
AST (SGOT): 19 IU/L (ref 0–40)
Albumin: 3.9 g/dL (ref 3.5–4.7)
Alk. phosphatase: 102 IU/L (ref 39–117)
BUN/Creatinine ratio: 17 (ref 12–28)
BUN: 19 mg/dL (ref 8–27)
Bilirubin, total: 0.4 mg/dL (ref 0.0–1.2)
CO2: 24 mmol/L (ref 20–29)
Calcium: 8.9 mg/dL (ref 8.7–10.3)
Chloride: 104 mmol/L (ref 96–106)
Creatinine: 1.12 mg/dL — ABNORMAL HIGH (ref 0.57–1.00)
GFR est AA: 52 mL/min/{1.73_m2} — ABNORMAL LOW (ref >59)
GFR est non-AA: 45 mL/min/{1.73_m2} — ABNORMAL LOW (ref >59)
GLOBULIN, TOTAL: 3 g/dL (ref 1.5–4.5)
Glucose: 91 mg/dL (ref 65–99)
Potassium: 4.5 mmol/L (ref 3.5–5.2)
Protein, total: 6.9 g/dL (ref 6.0–8.5)
Sodium: 142 mmol/L (ref 134–144)

## 2016-12-08 ENCOUNTER — Inpatient Hospital Stay: Admit: 2016-12-08 | Payer: MEDICARE | Primary: Specialist

## 2016-12-08 DIAGNOSIS — R2689 Other abnormalities of gait and mobility: Secondary | ICD-10-CM

## 2016-12-08 DIAGNOSIS — T148XXA Other injury of unspecified body region, initial encounter: Secondary | ICD-10-CM

## 2016-12-08 NOTE — Other (Signed)
Natalie Moon  DOB: 1929-11-26  [place]  Secondary: [name] Medicare [facility]* [facility] at [facility]  [address], [address], [place] [address]  Phone:[phone]   Fax:[phone]          OUTPATIENT PHYSICAL THERAPY:Initial Assessment 12/08/2016   ICD-10: Treatment Diagnosis:   ?? Other abnormalities of gait and mobility (R26.89)  ?? Difficulty in walking, not elsewhere classified (R26.2)  Precautions/Allergies:   Shellfish derived   Blood clots, COPD, falling, and severe arthritis  MD Orders: Evaluate and Treat MEDICAL/REFERRING DIAGNOSIS:  Hematoma of skin [T14.8XXA]  History of fall [Z91.81]   DATE OF ONSET: Chronic  REFERRING PHYSICIAN: Stevie Kern, MD  RETURN PHYSICIAN APPOINTMENT: TBD   INITIAL ASSESSMENT:  Natalie Moon presents with decreased mobility and fall risk.  After discussing with patient, she agreed she would benefit from physical therapy to improve above deficits.  Please sign this plan of treatment if you concur.  Thank you for the opportunity to serve this patient.       PROBLEM LIST (Impacting functional limitations):  1. Decreased Strength  2. Decreased ADL/Functional Activities  3. Decreased Ambulation Ability/Technique  4. Decreased Balance  5. Decreased Activity Tolerance INTERVENTIONS PLANNED:  1. Balance Exercise  2. Gait Training  3. Home Exercise Program (HEP)  4. Neuromuscular Re-education/Strengthening  5. Range of Motion (ROM)  6. Therapeutic Activites  7. Therapeutic Exercise/Strengthening   TREATMENT PLAN:  Effective Dates: 12/08/2016 TO 03/08/2017 (90 days).  Frequency/Duration: 2 times a week for 90 Day(s)  GOALS: (Goals have been discussed and agreed upon with patient.)  Short-Term Functional Goals: Time Frame: 30 days  1. Patient will be independent with home exercise program without exacerbation of symptoms or cueing needed.  Discharge Goals: Time Frame: 90 days   1. Patient will be independent with all ADLs with minimal fear of falling and loss of balance with daily tasks.  2. Patient will report no fear avoidance with social or recreational activities due to fear of falling.  3. Patient will score greater than or equal to 39/56 on Berg Balance Scale with minimal effect of imbalance or fatigue on patient's ability to manage every day life activities.   4. Patient will score greater than or equal to 19/24 on Dynamic Gait Index with minimal effect of imbalance or fatigue on patient's ability to manage every day life activities.  Rehabilitation Potential For Stated Goals: Good  Regarding Natalie Moon's therapy, I certify that the treatment plan above will be carried out by a therapist or under their direction.  Thank you for this referral,  Allene Dillon, PT     Referring Physician Signature: Stevie Kern, MD            Date                  The information in this section was collected on 12/08/2016 (except where otherwise noted).  HISTORY:   History of Present Injury/Illness (Reason for Referral):  Patient reports she has a history of blood clots, COPD, falling, and severe arthritis.  She reports that her most recent fall was about a week ago. She reports she fell into the TV table and then onto the floor.  She reports she had to have help getting up. She reports that if she stands for too long, like waiting in line at the grocery store, she might pass out.  She report she has to have  help getting up from the dinning chair at her assisted living community.  She reports she would like to improve her overall endurance and mobility/strength.  Past Medical History/Comorbidities:   Natalie Moon  has a past medical history of Anemia, Arrhythmia, Asthma, CAD (coronary artery disease), Cancer (HCC), Cirrhosis of liver without ascites (HCC), COPD, Depression, DJD (degenerative joint disease), Dyspnea, GERD (gastroesophageal reflux disease), Hypertension, Psychiatric  disorder, PUD (peptic ulcer disease), Seizures (HCC), and Thyroid disease.  Natalie Moon  has a past surgical history that includes hx orthopaedic; hx tonsillectomy; pr abdomen surgery proc unlisted; hx cholecystectomy; hx appendectomy; hx hysterectomy; hx other surgical; hx heart catheterization; and hx colonoscopy (11/04/2015).  Social History/Living Environment:   Home Environment: Private residence  Living Alone: No  Support Systems: Family member(s), Friends \\ neighbors  Prior Level of Function/Work/Activity:  Independent  Dominant Side:         RIGHT  Personal Factors:          Sex:  female        Age:  83 y.o.     Ambulatory/Rehab Services H2 Model Falls Risk Assessment    Risk Factors:       (5)  History of Recent Falls [w/in 3 months] Ability to Rise from Chair:       (1)  Pushes up, successful in one attempt    Falls Prevention Plan:       No modifications necessary   Total: (5 or greater = High Risk): 6    ??2010 [employer] All Rights Reserved. [employer] Patent [phone]#7,282,031. Federal Law prohibits the replication, distribution or use without written permission from [employer]     Current Medications:       Current Outpatient Medications:   ???  Lactobacillus Acidoph & Bulgar (FLORANEX) 1 million cell tab tablet, Take 2 Tabs by mouth daily., Disp: 60 Tab, Rfl: 1  ???  celecoxib (CELEBREX) 200 mg capsule, Take 1 Cap by mouth daily., Disp: 90 Cap, Rfl: 0  ???  umeclidinium-vilanterol (ANORO ELLIPTA) 62.5-25 mcg/actuation inhaler, Take 1 Puff by inhalation daily., Disp: 1 Inhaler, Rfl: 5  ???  losartan (COZAAR) 100 mg tablet, Take 1 Tab by mouth daily. Needs appt for additional refills, Disp: 90 Tab, Rfl: 4  ???  ferrous fumarate (HEMOCYTE) 324 mg (106 mg iron) tab, Take 1 Each by mouth daily (after dinner)., Disp: 90 Tab, Rfl: 4  ???  nitroglycerin (NITROSTAT) 0.4 mg SL tablet, 1 Tab by SubLINGual route every five (5) minutes as needed for Chest Pain., Disp: 1 Bottle, Rfl: 5   ???  DULoxetine (CYMBALTA) 60 mg capsule, Take 1 Cap by mouth daily., Disp: 90 Cap, Rfl: 3  ???  clopidogrel (PLAVIX) 75 mg tab, Take 1 Tab by mouth daily., Disp: 90 Tab, Rfl: 3  ???  temazepam (RESTORIL) 15 mg capsule, Take 1 Cap by mouth nightly. Max Daily Amount: 15 mg., Disp: 30 Cap, Rfl: 3  ???  levothyroxine (SYNTHROID) 50 mcg tablet, TAKE 1 TABLET BY MOUTH EVERY DAY, Disp: 90 Tab, Rfl: 1  ???  albuterol (PROVENTIL HFA, VENTOLIN HFA, PROAIR HFA) 90 mcg/actuation inhaler, Take 2 Puffs by inhalation every four (4) hours as needed for Wheezing., Disp: 1 Inhaler, Rfl: 11  ???  esomeprazole (NEXIUM) 20 mg capsule, Take  by mouth daily., Disp: , Rfl:    Date Last Reviewed:  12/08/2016    Number of Personal Factors/Comorbidities that affect the Plan of Care: 1-2: MODERATE COMPLEXITY  EXAMINATION:   Observation/Orthostatic Postural Assessment:          Posture Assessment: Rounded shoulders, Forward head   Functional Mobility:         Gait/Mobility:    Base of Support: Center of gravity altered  Speed/Cadence: Pace decreased (<100 feet/min)  Step Length: Left shortened, Right shortened  Swing Pattern: Left asymmetrical, Right asymmetrical  Stance: Left increased, Right increased  Gait Abnormalities: Antalgic  Ambulation - Level of Assistance: Modified independent  Assistive Device: Cane, straight  ?? Interventions: Verbal cues, Safety awareness training          Transfers:     Sit to Stand: Modified independent  Stand to Sit: Modified independent  Stand Pivot Transfers: Modified independent  Bed to Chair: Modified independent  Lateral Transfers: Modified independent         Bed Mobility:     Rolling: Modified independent  Supine to Sit: Modified independent  Sit to Supine: Modified independent  Scooting: Modified independent      Strength:          UE STRENGTH: 3-/5 shoulder flexion, 3-/5 shoulder abduction, 3-/5 shoulder extension, 3-/5 elbow flexion, 3-/5 elbow extension.    LE STRENGTH:  3-/5 hip flexion, 3-/5 hip abduction, 3-/5 hip adduction, 3-/5 hip extension, 3-/5 knee extension, 3-/5 knee flexion, 3-/5 ankle dorsiflexion, 3-/5 ankle plantarflexion, 3-/5 ankle inversion, 3-/5 ankle eversion.  Sensation:         Decreased light touch sensation noted on right side versus left in UE and LE  Postural Control & Balance:?? Berg Balance Scale:  19/56.   (A score less than 45/56 indicates high risk of falls)     ?? Dynamic Gait Index:  9/24.   (A score less than or equal to19/24 is abnormal and predictive of falls)        Body Structures Involved:  1. Nerves  2. Muscles Body Functions Affected:  1. Neuromusculoskeletal  2. Movement Related Activities and Participation Affected:  1. Mobility  2. Self Care   Number of elements (examined above) that affect the Plan of Care: 4+: HIGH COMPLEXITY   CLINICAL PRESENTATION:   Presentation: Evolving clinical presentation with changing clinical characteristics: MODERATE COMPLEXITY   CLINICAL DECISION MAKING:   Outcome Measure:   Tool Used: Berg Balance Scale  Score:  Initial: 19/56 Most Recent: X/56 (Date: -- )   Interpretation of Score: Each section is scored on a 0-4 scale, 0 representing the patient???s inability to perform the task and 4 representing independence.  The scores of each section are added together for a total score of 56.  The higher the patient???s score, the more independent the patient is.  Any score below 45 indicates increased risk for falls.  Score 56 55-45 44-34 33-23 22-12 11-1 0   Modifier CH CI CJ CK CL CM CN     Tool Used: Dynamic Gait Index  Score:  Initial: 9/24 Most Recent: X/24 (Date: -- )   Interpretation of Score: Each section is scored on a 0-3 scale, 0 representing the patient???s inability to perform the task and 3 representing independence.  The scores of each section are added together for a total score of 24.  Any score below 19 indicates increased risk for falls.  Score 24 23-19 18-15 14-10 9-5 4-1 0    Modifier CH CI CJ CK CL CM CN     ? Mobility - Walking and Moving Around:    Z6109 - CURRENT STATUS: CL - 60%-79% impaired,  limited or restricted   G8979 - GOAL STATUS: CJ - 20%-39% impaired, limited or restricted   [id number] - D/C STATUS:  ---------------To be determined---------------    Medical Necessity:   ?? Patient is expected to demonstrate progress in strength, range of motion, balance, coordination and functional technique to improve safety during daily activities.  ?? Patient demonstrates good rehab potential due to higher previous functional level.  ?? Skilled intervention continues to be required due to decreased mobility.  Reason for Services/Other Comments:  ?? Patient continues to demonstrate capacity to improve overall mobility which will increase independence and increase safety.   Use of outcome tool(s) and clinical judgement create a POC that gives a: Questionable prediction of patient's progress: MODERATE COMPLEXITY            TREATMENT:   (In addition to Assessment/Re-Assessment sessions the following treatments were rendered)  Pre-treatment Symptoms/Complaints:  12/08/2016: Patient reports she is already tired today.   Pain: Initial:   Pain Intensity 1: 0  Post Session:  0/10     THERAPEUTIC EXERCISE: (15 minutes):  Exercises per grid below to improve mobility, strength, balance and coordination.  Required minimal visual, verbal and manual cues to promote proper body alignment, promote proper body posture, promote proper body mechanics and promote proper body breathing techniques.  Progressed resistance, range, repetitions and complexity of movement as indicated.   Date:  12/08/2016   Activity/Exercise Parameters   Bridging 10 reps  HEP   Seated LAQ 10 reps  B LE  HEP       Treatment/Session Assessment:    ?? Response to Treatment:  Patient tolerated assessment with complaints of increased imbalance or fatigue.  Patient verbalized and demonstrated understanding of HEP.   ?? Compliance with Program/Exercises: Will assess as treatment progresses.  ?? Recommendations/Intent for next treatment session: "Next visit will focus on advancements to more challenging activities".  Total Treatment Duration:  PT Patient Time In/Time Out  Time In: 0945  Time Out: 1030    Allene DillonJennifer N Brenae Lasecki, [place]    Future Appointments   Date Time Provider Department Center   12/15/2016 10:30 AM Allene DillonSams, [facility]inz[facility] Dalia N[facility]   12/19/2016 10:30 AM Allene DillonSams, Erving Sassano N, PT [facility]   12/20/2016  1:45 PM Stevie KernPatel, Sudhirkumar C, MD [facility]   12/28/2016 11:15 AM Allene DillonSams, Cortny Bambach N, PT [facility]   01/04/2017 10:30 AM Allene DillonSams, Dnasia Gauna N, PT [facility]

## 2016-12-15 NOTE — Progress Notes (Signed)
[facility]rapy Center at [facility]   [facility] [address]  [address], [address] [place], [place] [id number]  Phone: [phone]   Fax: [phone]    OUTPATIENT DAILY NOTE    NAME/AGE/GENDER: Natalie Moon is a 83 y.o. female.     DATE: 12/15/2016    Patient cancelled her appointment for today due to weather.  Will plan to follow up on next scheduled visit.    Allene DillonJennifer N Joely Losier, PT

## 2016-12-16 ENCOUNTER — Inpatient Hospital Stay: Payer: MEDICARE | Primary: Specialist

## 2016-12-19 ENCOUNTER — Inpatient Hospital Stay: Admit: 2016-12-19 | Payer: MEDICARE | Primary: Specialist

## 2016-12-19 NOTE — Progress Notes (Signed)
Natalie Moon  DOB: [birth year]  [place]  Secondary: [name]  [address], [place] [address]  Phone:[phone]   Fax:[phone]          OUTPATIENT PHYSICAL THERAPY:Daily Note 12/19/2016   ICD-10: Treatment Diagnosis:   ?? Other abnormalities of gait and mobility (R26.89)  ?? Difficulty in walking, not elsewhere classified (R26.2)  Precautions/Allergies:   Shellfish derived   Blood clots, COPD, falling, and severe arthritis  MD Orders: Evaluate and Treat MEDICAL/REFERRING DIAGNOSIS:  Hematoma of skin [T14.8XXA]  History of fall [Z91.81]   DATE OF ONSET: Chronic  REFERRING PHYSICIAN: Stevie Kern, MD  RETURN PHYSICIAN APPOINTMENT: TBD   ASSESSMENT:  Natalie Moon presents with decreased mobility and fall risk. Patient has attended a total of 2 scheduled physical therapy visits including initial evaluation on 12/08/2016.  Treatment has consisted of balance and strengthening activiteies to improve overall mobility and performance with activities of daily living.       PROBLEM LIST (Impacting functional limitations):  1. Decreased Strength  2. Decreased ADL/Functional Activities  3. Decreased Ambulation Ability/Technique  4. Decreased Balance  5. Decreased Activity Tolerance INTERVENTIONS PLANNED:  1. Balance Exercise  2. Gait Training  3. Home Exercise Program (HEP)  4. Neuromuscular Re-education/Strengthening  5. Range of Motion (ROM)  6. Therapeutic Activites  7. Therapeutic Exercise/Strengthening   TREATMENT PLAN:  Effective Dates: 12/08/2016 TO 03/08/2017 (90 days).  Frequency/Duration: 2 times a week for 90 Day(s)  GOALS: (Goals have been discussed and agreed upon with patient.)  Short-Term Functional Goals: Time Frame: 30 days  1. Patient will be independent with home exercise program without exacerbation of symptoms or cueing needed.  Discharge Goals: Time Frame: 90 days   1. Patient will be independent with all ADLs with minimal fear of falling and loss of balance with daily tasks.  2. Patient will report no fear avoidance with social or recreational activities due to fear of falling.  3. Patient will score greater than or equal to 39/56 on Berg Balance Scale with minimal effect of imbalance or fatigue on patient's ability to manage every day life activities.   4. Patient will score greater than or equal to 19/24 on Dynamic Gait Index with minimal effect of imbalance or fatigue on patient's ability to manage every day life activities.  Rehabilitation Potential For Stated Goals: Good            The information in this section was collected on 12/08/2016 (except where otherwise noted).  HISTORY:   History of Present Injury/Illness (Reason for Referral):  Patient reports she has a history of blood clots, COPD, falling, and severe arthritis.  She reports that her most recent fall was about a week ago. She reports she fell into the TV table and then onto the floor.  She reports she had to have help getting up. She reports that if she stands for too long, like waiting in line at the grocery store, she might pass out.  She report she has to have help getting up from the dinning chair at her assisted living community.  She reports she would like to improve her overall endurance and mobility/strength.  Past Medical History/Comorbidities:   Natalie Moon  has a past medical history of Anemia, Arrhythmia, Asthma, CAD (coronary artery disease), Cancer (HCC), Cirrhosis of liver without ascites (HCC), COPD, Depression, DJD (degenerative joint disease), Dyspnea, GERD (gastroesophageal reflux disease),  Hypertension, Psychiatric disorder, PUD (peptic ulcer disease), Seizures (HCC), and Thyroid disease.  Natalie Moon  has a past surgical history that includes hx orthopaedic; hx tonsillectomy; pr abdomen surgery proc unlisted; hx cholecystectomy; hx appendectomy; hx hysterectomy; hx other surgical; hx heart  catheterization; and hx colonoscopy (11/04/2015).  Social History/Living Environment:   Home Environment: Private residence  Living Alone: No  Support Systems: Family member(s), Friends \\ neighbors  Prior Level of Function/Work/Activity:  Independent  Dominant Side:         RIGHT  Personal Factors:          Sex:  female        Age:  83 y.o.     Ambulatory/Rehab Services H2 Model Falls Risk Assessment    Risk Factors:       (5)  History of Recent Falls [w/in 3 months] Ability to Rise from Chair:       (1)  Pushes up, successful in one attempt    Falls Prevention Plan:       No modifications necessary   Total: (5 or greater = High Risk): 6    ??2010 [employer] All Rights Reserved. [employer] Patent [phone],282,031. Federal Law prohibits the replication, distribution or use without written permission from [employer]     Current Medications:       Current Outpatient Medications:   ???  Lactobacillus Acidoph & Bulgar (FLORANEX) 1 million cell tab tablet, Take 2 Tabs by mouth daily., Disp: 60 Tab, Rfl: 1  ???  celecoxib (CELEBREX) 200 mg capsule, Take 1 Cap by mouth daily., Disp: 90 Cap, Rfl: 0  ???  umeclidinium-vilanterol (ANORO ELLIPTA) 62.5-25 mcg/actuation inhaler, Take 1 Puff by inhalation daily., Disp: 1 Inhaler, Rfl: 5  ???  losartan (COZAAR) 100 mg tablet, Take 1 Tab by mouth daily. Needs appt for additional refills, Disp: 90 Tab, Rfl: 4  ???  ferrous fumarate (HEMOCYTE) 324 mg (106 mg iron) tab, Take 1 Each by mouth daily (after dinner)., Disp: 90 Tab, Rfl: 4  ???  nitroglycerin (NITROSTAT) 0.4 mg SL tablet, 1 Tab by SubLINGual route every five (5) minutes as needed for Chest Pain., Disp: 1 Bottle, Rfl: 5  ???  DULoxetine (CYMBALTA) 60 mg capsule, Take 1 Cap by mouth daily., Disp: 90 Cap, Rfl: 3  ???  clopidogrel (PLAVIX) 75 mg tab, Take 1 Tab by mouth daily., Disp: 90 Tab, Rfl: 3  ???  temazepam (RESTORIL) 15 mg capsule, Take 1 Cap by mouth nightly. Max Daily Amount: 15 mg., Disp: 30 Cap, Rfl: 3   ???  levothyroxine (SYNTHROID) 50 mcg tablet, TAKE 1 TABLET BY MOUTH EVERY DAY, Disp: 90 Tab, Rfl: 1  ???  albuterol (PROVENTIL HFA, VENTOLIN HFA, PROAIR HFA) 90 mcg/actuation inhaler, Take 2 Puffs by inhalation every four (4) hours as needed for Wheezing., Disp: 1 Inhaler, Rfl: 11  ???  esomeprazole (NEXIUM) 20 mg capsule, Take  by mouth daily., Disp: , Rfl:    Date Last Reviewed:  12/19/2016    EXAMINATION:   Observation/Orthostatic Postural Assessment:          Posture Assessment: Rounded shoulders, Forward head   Functional Mobility:         Gait/Mobility:    Base of Support: Center of gravity altered  Speed/Cadence: Pace decreased (<100 feet/min)  Step Length: Left shortened, Right shortened  Swing Pattern: Left asymmetrical, Right asymmetrical  Stance: Left increased, Right increased  Gait Abnormalities: Antalgic  Ambulation - Level of Assistance: Modified  independent  Assistive Device: Cane, straight  ?? Interventions: Verbal cues, Safety awareness training          Transfers:     Sit to Stand: Modified independent  Stand to Sit: Modified independent  Stand Pivot Transfers: Modified independent  Bed to Chair: Modified independent  Lateral Transfers: Modified independent         Bed Mobility:     Rolling: Modified independent  Supine to Sit: Modified independent  Sit to Supine: Modified independent  Scooting: Modified independent      Strength:          UE STRENGTH: 3-/5 shoulder flexion, 3-/5 shoulder abduction, 3-/5 shoulder extension, 3-/5 elbow flexion, 3-/5 elbow extension.   LE STRENGTH:  3-/5 hip flexion, 3-/5 hip abduction, 3-/5 hip adduction, 3-/5 hip extension, 3-/5 knee extension, 3-/5 knee flexion, 3-/5 ankle dorsiflexion, 3-/5 ankle plantarflexion, 3-/5 ankle inversion, 3-/5 ankle eversion.  Sensation:         Decreased light touch sensation noted on right side versus left in UE and LE  Postural Control & Balance:?? Berg Balance Scale:  19/56.   (A score less than 45/56 indicates high risk of falls)      ?? Dynamic Gait Index:  9/24.   (A score less than or equal to19/24 is abnormal and predictive of falls)        CLINICAL DECISION MAKING:   Outcome Measure:   Tool Used: Berg Balance Scale  Score:  Initial: 19/56 Most Recent: X/56 (Date: -- )   Interpretation of Score: Each section is scored on a 0-4 scale, 0 representing the patient???s inability to perform the task and 4 representing independence.  The scores of each section are added together for a total score of 56.  The higher the patient???s score, the more independent the patient is.  Any score below 45 indicates increased risk for falls.  Score 56 55-45 44-34 33-23 22-12 11-1 0   Modifier CH CI CJ CK CL CM CN     Tool Used: Dynamic Gait Index  Score:  Initial: 9/24 Most Recent: X/24 (Date: -- )   Interpretation of Score: Each section is scored on a 0-3 scale, 0 representing the patient???s inability to perform the task and 3 representing independence.  The scores of each section are added together for a total score of 24.  Any score below 19 indicates increased risk for falls.  Score 24 23-19 18-15 14-10 9-5 4-1 0   Modifier CH CI CJ CK CL CM CN     ? Mobility - Walking and Moving Around:    Z6109 - CURRENT STATUS: CL - 60%-79% impaired, limited or restricted   U0454 - GOAL STATUS: CJ - 20%-39% impaired, limited or restricted   U9811 - D/C STATUS:  ---------------To be determined---------------    Medical Necessity:   ?? Patient is expected to demonstrate progress in strength, range of motion, balance, coordination and functional technique to improve safety during daily activities.  ?? Patient demonstrates good rehab potential due to higher previous functional level.  ?? Skilled intervention continues to be required due to decreased mobility.  Reason for Services/Other Comments:  ?? Patient continues to demonstrate capacity to improve overall mobility which will increase independence and increase safety.            TREATMENT:    (In addition to Assessment/Re-Assessment sessions the following treatments were rendered)  Pre-treatment Symptoms/Complaints:  12/19/2016: Patient reports she is feeling ok right now.  Pain: Initial:  Pain Intensity 1: 0  Post Session:  0/10     THERAPEUTIC EXERCISE: (20 minutes):  Exercises per grid below to improve mobility, strength, balance and coordination.  Required minimal visual, verbal and manual cues to promote proper body alignment, promote proper body posture, promote proper body mechanics and promote proper body breathing techniques.  Progressed resistance, range, repetitions and complexity of movement as indicated.   Date:  12/09/2016   Activity/Exercise Parameters   Standing hip flexion 10 reps  B LE   Standing hip extension 10 reps  B LE   Standing hip abduction 10 reps  B LE   Standing knee flexion 10 reps  B LE   Seated LAQ 10 reps  B LE   Seated heel raises 10 reps  B LE   Seated toe raises 10 reps  B LE   Step ups in parallel bars 4 inch  5 reps   B LE     NEUROMUSCULAR RE-EDUCATION: (25 minutes):  Exercise/activities per grid below to improve balance and coordination.  Required minimal verbal and manual cues to promote static and dynamic balance in standing.    Date:  12/19/2016   Activity/Exercise Parameters   Marching in parallel bars 4 laps   Side stepping in parallel bars 4 laps   Walking backward in parallel bars 4 laps   Step taps 4 inch  10 reps  B LE   Step overs 1/2 foam roll  10 reps  B LE   Side step overs 1/2 foam roll  10 reps  B LE   Static stance with feet apart Eyes open  Eyes closed  10 seconds   Staggered stance Eyes open  10 second hold  B LE   Romberg stance Eyes open  5 seconds       Treatment/Session Assessment:    ?? Response to Treatment:  Patient tolerated treatment with complaints of increased imbalance or fatigue.  Patient needed multiple rest breaks due to fatigue.  ?? Compliance with Program/Exercises: Will assess as treatment progresses.   ?? Recommendations/Intent for next treatment session: "Next visit will focus on advancements to more challenging activities".  Total Treatment Duration:  PT Patient Time In/Time Out  Time In: 1030  Time Out: 1115    Allene DillonJennifer N Genevie Elman, PT    Future Appointments   Date Time Provider Department Center   12/19/2016 10:30 AM Allene DillonSams, Eulalah Rupert N[facility]   12/20/2016  1:45 PM Stevie KernPatel, Sudhirkumar C, MD [facility]   12/28/2016 11:15 AM Allene DillonSams, Carliss Porcaro N, PT [facility]   01/04/2017 10:30 AM Allene DillonSams, Alpheus Stiff N, PT [facility]

## 2016-12-20 ENCOUNTER — Ambulatory Visit: Admit: 2016-12-20 | Discharge: 2016-12-20 | Payer: MEDICARE | Attending: Specialist | Primary: Specialist

## 2016-12-20 DIAGNOSIS — I10 Essential (primary) hypertension: Secondary | ICD-10-CM

## 2016-12-20 MED ORDER — PRASTERONE (DHEA) 6.5 MG VAGINAL INSERT
6.5 mg | VAGINAL | 4 refills | Status: DC
Start: 2016-12-20 — End: 2017-04-25
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
## 2016-12-27 ENCOUNTER — Encounter: Attending: Specialist | Primary: Specialist

## 2016-12-28 ENCOUNTER — Inpatient Hospital Stay: Admit: 2016-12-28 | Payer: MEDICARE | Primary: Specialist

## 2016-12-28 NOTE — Progress Notes (Signed)
Natalie Moon  DOB: 06/21/1929  [place]  Secondary: Natalie Moon [employer] S* [facility]  [address], [address], [place] [id number]  Phone:[phone]   Fax:[phone]          OUTPATIENT PHYSICAL THERAPY:Daily Note 12/28/2016   ICD-10: Treatment Diagnosis:   ?? Other abnormalities of gait and mobility (R26.89)  ?? Difficulty in walking, not elsewhere classified (R26.2)  Precautions/Allergies:   Shellfish derived   Blood clots, COPD, falling, and severe arthritis  MD Orders: Evaluate and Treat MEDICAL/REFERRING DIAGNOSIS:  Hematoma of skin [T14.8XXA]  History of fall [Z91.81]   DATE OF ONSET: Chronic  REFERRING PHYSICIAN: Stevie KernPatel, Sudhirkumar C, MD  RETURN PHYSICIAN APPOINTMENT: TBD   ASSESSMENT:  [name] presents with decreased mobility and fall risk. Patient has attended a total of 3 scheduled physical therapy visits including initial evaluation on 12/08/2016.  Treatment has consisted of balance and strengthening activiteies to improve overall mobility and performance with activities of daily living.       PROBLEM LIST (Impacting functional limitations):  1. Decreased Strength  2. Decreased ADL/Functional Activities  3. Decreased Ambulation Ability/Technique  4. Decreased Balance  5. Decreased Activity Tolerance INTERVENTIONS PLANNED:  1. Balance Exercise  2. Gait Training  3. Home Exercise Program (HEP)  4. Neuromuscular Re-education/Strengthening  5. Range of Motion (ROM)  6. Therapeutic Activites  7. Therapeutic Exercise/Strengthening   TREATMENT PLAN:  Effective Dates: 12/08/2016 TO 03/08/2017 (90 days).  Frequency/Duration: 2 times a week for 90 Day(s)  GOALS: (Goals have been discussed and agreed upon with patient.)  Short-Term Functional Goals: Time Frame: 30 days  1. Patient will be independent with home exercise program without exacerbation of symptoms or cueing needed.  Discharge Goals: Time Frame: 90 days   1. Patient will be independent with all ADLs with minimal fear of falling and loss of balance with daily tasks.  2. Patient will report no fear avoidance with social or recreational activities due to fear of falling.  3. Patient will score greater than or equal to 39/56 on Berg Balance Scale with minimal effect of imbalance or fatigue on patient's ability to manage every day life activities.   4. Patient will score greater than or equal to 19/24 on Dynamic Gait Index with minimal effect of imbalance or fatigue on patient's ability to manage every day life activities.  Rehabilitation Potential For Stated Goals: Good            The information in this section was collected on 12/08/2016 (except where otherwise noted).  HISTORY:   History of Present Injury/Illness (Reason for Referral):  Patient reports she has a history of blood clots, COPD, falling, and severe arthritis.  She reports that her most recent fall was about a week ago. She reports she fell into the TV table and then onto the floor.  She reports she had to have help getting up. She reports that if she stands for too long, like waiting in line at the grocery store, she might pass out.  She report she has to have help getting up from the dinning chair at her assisted living community.  She reports she would like to improve her overall endurance and mobility/strength.  Past Medical History/Comorbidities:   [name]  has a past medical history of Anemia, Arrhythmia, Asthma, CAD (coronary artery disease), Cancer (HCC), Cirrhosis of liver without ascites (HCC), COPD, Depression, DJD (degenerative joint disease), Dyspnea, GERD (gastroesophageal reflux disease),  Hypertension, Psychiatric disorder, PUD (peptic ulcer disease), Seizures (HCC), and Thyroid disease.  [name]  has a past surgical history that includes hx orthopaedic; hx tonsillectomy; pr abdomen surgery proc unlisted; hx cholecystectomy; hx appendectomy; hx hysterectomy; hx other surgical; hx heart  catheterization; and hx colonoscopy (11/04/2015).  Social History/Living Environment:   Home Environment: Private residence  Living Alone: No  Support Systems: Family member(s), Friends \\ neighbors  Prior Level of Function/Work/Activity:  Independent  Dominant Side:         RIGHT  Personal Factors:          Sex:  female        Age:  83 y.o.     Ambulatory/Rehab Services H2 Model Falls Risk Assessment    Risk Factors:       (5)  History of Recent Falls [w/in 3 months] Ability to Rise from Chair:       (1)  Pushes up, successful in one attempt    Falls Prevention Plan:       No modifications necessary   Total: (5 or greater = High Risk): 6    ??2010 [employer]. All Rights Reserved. [place] [employer] services engineer [phone]. Federal Law prohibits the replication, distribution or use without written permission from [employer]     Current Medications:       Current Outpatient Medications:   ???  prasterone, dhea, (INTRAROSA) 6.5 mg inst, One as directed, Disp: 30 Each, Rfl: 4  ???  Lactobacillus Acidoph & Bulgar (FLORANEX) 1 million cell tab tablet, Take 2 Tabs by mouth daily., Disp: 60 Tab, Rfl: 1  ???  celecoxib (CELEBREX) 200 mg capsule, Take 1 Cap by mouth daily., Disp: 90 Cap, Rfl: 0  ???  umeclidinium-vilanterol (ANORO ELLIPTA) 62.5-25 mcg/actuation inhaler, Take 1 Puff by inhalation daily., Disp: 1 Inhaler, Rfl: 5  ???  losartan (COZAAR) 100 mg tablet, Take 1 Tab by mouth daily. Needs appt for additional refills, Disp: 90 Tab, Rfl: 4  ???  ferrous fumarate (HEMOCYTE) 324 mg (106 mg iron) tab, Take 1 Each by mouth daily (after dinner)., Disp: 90 Tab, Rfl: 4  ???  nitroglycerin (NITROSTAT) 0.4 mg SL tablet, 1 Tab by SubLINGual route every five (5) minutes as needed for Chest Pain., Disp: 1 Bottle, Rfl: 5  ???  DULoxetine (CYMBALTA) 60 mg capsule, Take 1 Cap by mouth daily., Disp: 90 Cap, Rfl: 3  ???  clopidogrel (PLAVIX) 75 mg tab, Take 1 Tab by mouth daily., Disp: 90 Tab, Rfl: 3   ???  temazepam (RESTORIL) 15 mg capsule, Take 1 Cap by mouth nightly. Max Daily Amount: 15 mg., Disp: 30 Cap, Rfl: 3  ???  levothyroxine (SYNTHROID) 50 mcg tablet, TAKE 1 TABLET BY MOUTH EVERY DAY, Disp: 90 Tab, Rfl: 1  ???  albuterol (PROVENTIL HFA, VENTOLIN HFA, PROAIR HFA) 90 mcg/actuation inhaler, Take 2 Puffs by inhalation every four (4) hours as needed for Wheezing., Disp: 1 Inhaler, Rfl: 11  ???  esomeprazole (NEXIUM) 20 mg capsule, Take  by mouth daily., Disp: , Rfl:    Date Last Reviewed:  12/28/2016    EXAMINATION:   Observation/Orthostatic Postural Assessment:          Posture Assessment: Rounded shoulders, Forward head   Functional Mobility:         Gait/Mobility:    Base of Support: Center of gravity altered  Speed/Cadence: Pace decreased (<100 feet/min)  Step Length: Left shortened, Right shortened  Swing Pattern: Left asymmetrical, Right asymmetrical  Stance: Left increased, Right increased  Gait Abnormalities: Antalgic  Ambulation - Level of Assistance: Modified independent  Assistive Device: Cane, straight  ?? Interventions: Verbal cues, Safety awareness training          Transfers:     Sit to Stand: Modified independent  Stand to Sit: Modified independent  Stand Pivot Transfers: Modified independent  Bed to Chair: Modified independent  Lateral Transfers: Modified independent         Bed Mobility:     Rolling: Modified independent  Supine to Sit: Modified independent  Sit to Supine: Modified independent  Scooting: Modified independent      Strength:          UE STRENGTH: 3-/5 shoulder flexion, 3-/5 shoulder abduction, 3-/5 shoulder extension, 3-/5 elbow flexion, 3-/5 elbow extension.   LE STRENGTH:  3-/5 hip flexion, 3-/5 hip abduction, 3-/5 hip adduction, 3-/5 hip extension, 3-/5 knee extension, 3-/5 knee flexion, 3-/5 ankle dorsiflexion, 3-/5 ankle plantarflexion, 3-/5 ankle inversion, 3-/5 ankle eversion.  Sensation:         Decreased light touch sensation noted on right side versus left in UE and LE   Postural Control & Balance:?? Berg Balance Scale:  19/56.   (A score less than 45/56 indicates high risk of falls)     ?? Dynamic Gait Index:  9/24.   (A score less than or equal to19/24 is abnormal and predictive of falls)        CLINICAL DECISION MAKING:   Outcome Measure:   Tool Used: Berg Balance Scale  Score:  Initial: 19/56 Most Recent: X/56 (Date: -- )   Interpretation of Score: Each section is scored on a 0-4 scale, 0 representing the patient???s inability to perform the task and 4 representing independence.  The scores of each section are added together for a total score of 56.  The higher the patient???s score, the more independent the patient is.  Any score below 45 indicates increased risk for falls.  Score 56 55-45 44-34 33-23 22-12 11-1 0   Modifier CH CI CJ CK CL CM CN     Tool Used: Dynamic Gait Index  Score:  Initial: 9/24 Most Recent: X/24 (Date: -- )   Interpretation of Score: Each section is scored on a 0-3 scale, 0 representing the patient???s inability to perform the task and 3 representing independence.  The scores of each section are added together for a total score of 24.  Any score below 19 indicates increased risk for falls.  Score 24 23-19 18-15 14-10 9-5 4-1 0   Modifier CH CI CJ CK CL CM CN     ? Mobility - Walking and Moving Around:    [id number] - CURRENT STATUS: CL - 60%-79% impaired, limited or restricted   U0454 - GOAL STATUS: CJ - 20%-39% impaired, limited or restricted   U9811 - D/Moon STATUS:  ---------------To be determined---------------    Medical Necessity:   ?? Patient is expected to demonstrate progress in strength, range of motion, balance, coordination and functional technique to improve safety during daily activities.  ?? Patient demonstrates good rehab potential due to higher previous functional level.  ?? Skilled intervention continues to be required due to decreased mobility.  Reason for Services/Other Comments:  ?? Patient continues to demonstrate capacity to improve overall mobility  which will increase independence and increase safety.            TREATMENT:   (In addition to Assessment/Re-Assessment sessions the following treatments were rendered)  Pre-treatment Symptoms/Complaints:  12/28/2016: Patient reports she is doing ok today, just a little tired.  Pain: Initial:   Pain Intensity 1: 0  Post Session:  0/10     THERAPEUTIC EXERCISE: (20 minutes):  Exercises per grid below to improve mobility, strength, balance and coordination.  Required minimal visual, verbal and manual cues to promote proper body alignment, promote proper body posture, promote proper body mechanics and promote proper body breathing techniques.  Progressed resistance, range, repetitions and complexity of movement as indicated.   Date:  12/28/2016   Activity/Exercise Parameters   Standing hip flexion 10 reps  B LE   Standing hip extension 10 reps  B LE   Standing hip abduction 10 reps  B LE   Standing knee flexion 10 reps  B LE   Seated LAQ 10 reps  B LE   Seated heel raises 10 reps  B LE   Seated toe raises 10 reps  B LE   Step ups in parallel bars 4 inch  5 reps   B LE     NEUROMUSCULAR RE-EDUCATION: (25 minutes):  Exercise/activities per grid below to improve balance and coordination.  Required minimal verbal and manual cues to promote static and dynamic balance in standing.    Date:  12/28/2016   Activity/Exercise Parameters   Marching in parallel bars 4 laps   Side stepping in parallel bars 4 laps   Walking backward in parallel bars 4 laps   Step taps 4 inch  10 reps  B LE   Step overs 1/2 foam roll  10 reps  B LE   Side step overs 1/2 foam roll  10 reps  B LE   Static stance with feet apart Eyes open  Eyes closed  10 seconds   Staggered stance Eyes open  10 second hold  B LE   Romberg stance Eyes open  5 seconds       Treatment/Session Assessment:    ?? Response to Treatment:  Patient tolerated treatment with complaints of increased imbalance or fatigue.  Patient needed multiple rest breaks due to fatigue.     ?? Compliance with Program/Exercises: Will assess as treatment progresses.  ?? Recommendations/Intent for next treatment session: "Next visit will focus on advancements to more challenging activities".  Total Treatment Duration:  PT Patient Time In/Time Out  Time In: 1115  Time Out: 1200    Allene DillonJennifer N Denvil Canning, PT    Future Appointments   Date Time Provider Department Center   12/28/2016 11:15 AM Allene DillonSams, Doloras Tellado N, PT [facility]   01/04/2017 10:30 AM Allene DillonSams, Cem Kosman N, PT [facility]   03/24/2017 11:00 AM Natalie KernPatel, Sudhirkumar C, MD [facility]

## 2017-01-04 ENCOUNTER — Inpatient Hospital Stay: Admit: 2017-01-04 | Payer: MEDICARE | Primary: Specialist

## 2017-01-04 DIAGNOSIS — R2689 Other abnormalities of gait and mobility: Secondary | ICD-10-CM

## 2017-01-04 NOTE — Progress Notes (Signed)
Natalie Moon  DOB: Mar 30, 1929  [place]  Secondary: [name] Senior Medicare S* Therapy Center at [facility]  [address], [address], [place] [address]  [phone]   [phone]          OUTPATIENT PHYSICAL THERAPY:Daily Note 01/04/2017   ICD-10: Treatment Diagnosis:   ?? Other abnormalities of gait and mobility (R26.89)  ?? Difficulty in walking, not elsewhere classified (R26.2)  Precautions/Allergies:   Shellfish derived   Blood clots, COPD, falling, and severe arthritis  MD Orders: Evaluate and Treat MEDICAL/REFERRING DIAGNOSIS:  Hematoma of skin [T14.8XXA]  History of fall [Z91.81]   DATE OF ONSET: Chronic  REFERRING PHYSICIAN: Stevie Kern, MD  RETURN PHYSICIAN APPOINTMENT: TBD   ASSESSMENT:  Natalie Moon presents with decreased mobility and fall risk. Patient has attended a total of 4 scheduled physical therapy visits including initial evaluation on 12/08/2016.  Treatment has consisted of balance and strengthening activiteies to improve overall mobility and performance with activities of daily living.       PROBLEM LIST (Impacting functional limitations):  1. Decreased Strength  2. Decreased ADL/Functional Activities  3. Decreased Ambulation Ability/Technique  4. Decreased Balance  5. Decreased Activity Tolerance INTERVENTIONS PLANNED:  1. Balance Exercise  2. Gait Training  3. Home Exercise Program (HEP)  4. Neuromuscular Re-education/Strengthening  5. Range of Motion (ROM)  6. Therapeutic Activites  7. Therapeutic Exercise/Strengthening   TREATMENT PLAN:  Effective Dates: 12/08/2016 TO 03/08/2017 (90 days).  Frequency/Duration: 2 times a week for 90 Day(s)  GOALS: (Goals have been discussed and agreed upon with patient.)  Short-Term Functional Goals: Time Frame: 30 days  1. Patient will be independent with home exercise program without exacerbation of symptoms or cueing needed.  Discharge Goals: Time Frame: 90 days   1. Patient will be independent with all ADLs with minimal fear of falling and loss of balance with daily tasks.  2. Patient will report no fear avoidance with social or recreational activities due to fear of falling.  3. Patient will score greater than or equal to 39/56 on Berg Balance Scale with minimal effect of imbalance or fatigue on patient's ability to manage every day life activities.   4. Patient will score greater than or equal to 19/24 on Dynamic Gait Index with minimal effect of imbalance or fatigue on patient's ability to manage every day life activities.  Rehabilitation Potential For Stated Goals: Good            The information in this section was collected on 12/08/2016 (except where otherwise noted).  HISTORY:   History of Present Injury/Illness (Reason for Referral):  Patient reports she has a history of blood clots, COPD, falling, and severe arthritis.  She reports that her most recent fall was about a week ago. She reports she fell into the TV table and then onto the floor.  She reports she had to have help getting up. She reports that if she stands for too long, like waiting in line at the grocery store, she might pass out.  She report she has to have help getting up from the dinning chair at her assisted living community.  She reports she would like to improve her overall endurance and mobility/strength.  Past Medical History/Comorbidities:   Natalie Moon  has a past medical history of Anemia, Arrhythmia, Asthma, CAD (coronary artery disease), Cancer (HCC), Cirrhosis of liver without ascites (HCC), COPD, Depression, DJD (degenerative joint disease), Dyspnea, GERD (gastroesophageal reflux disease),  Hypertension, Psychiatric disorder, PUD (peptic ulcer disease), Seizures (HCC), and Thyroid disease.  [name]  has a past surgical history that includes hx orthopaedic; hx tonsillectomy; pr abdomen surgery proc unlisted; hx cholecystectomy; hx appendectomy; hx hysterectomy; hx other surgical; hx heart  catheterization; and hx colonoscopy (11/04/2015).  Social History/Living Environment:   Home Environment: Private residence  Living Alone: No  Support Systems: Family member(s), Friends \\ neighbors  Prior Level of Function/Work/Activity:  Independent  Dominant Side:         RIGHT  Personal Factors:          Sex:  female        Age:  83 y.o.     Ambulatory/Rehab Services H2 Model Falls Risk Assessment    Risk Factors:       (5)  History of Recent Falls [w/in 3 months] Ability to Rise from Chair:       (1)  Pushes up, successful in one attempt    Falls Prevention Plan:       No modifications necessary   Total: (5 or greater = High Risk): 6    ??2010 [employer] All Rights Reserved. [employer] Patent [phone]#[id number]. Federal Law prohibits the replication, distribution or use without written permission from [employer]     Current Medications:       Current Outpatient Medications:   ???  prasterone, dhea, (INTRAROSA) 6.5 mg inst, One as directed, Disp: 30 Each, Rfl: 4  ???  Lactobacillus Acidoph & Bulgar (FLORANEX) 1 million cell tab tablet, Take 2 Tabs by mouth daily., Disp: 60 Tab, Rfl: 1  ???  celecoxib (CELEBREX) 200 mg capsule, Take 1 Cap by mouth daily., Disp: 90 Cap, Rfl: 0  ???  umeclidinium-vilanterol (ANORO ELLIPTA) 62.5-25 mcg/actuation inhaler, Take 1 Puff by inhalation daily., Disp: 1 Inhaler, Rfl: 5  ???  losartan (COZAAR) 100 mg tablet, Take 1 Tab by mouth daily. Needs appt for additional refills, Disp: 90 Tab, Rfl: 4  ???  ferrous fumarate (HEMOCYTE) 324 mg (106 mg iron) tab, Take 1 Each by mouth daily (after dinner)., Disp: 90 Tab, Rfl: 4  ???  nitroglycerin (NITROSTAT) 0.4 mg SL tablet, 1 Tab by SubLINGual route every five (5) minutes as needed for Chest Pain., Disp: 1 Bottle, Rfl: 5  ???  DULoxetine (CYMBALTA) 60 mg capsule, Take 1 Cap by mouth daily., Disp: 90 Cap, Rfl: 3  ???  clopidogrel (PLAVIX) 75 mg tab, Take 1 Tab by mouth daily., Disp: 90 Tab, Rfl: 3   ???  temazepam (RESTORIL) 15 mg capsule, Take 1 Cap by mouth nightly. Max Daily Amount: 15 mg., Disp: 30 Cap, Rfl: 3  ???  levothyroxine (SYNTHROID) 50 mcg tablet, TAKE 1 TABLET BY MOUTH EVERY DAY, Disp: 90 Tab, Rfl: 1  ???  albuterol (PROVENTIL HFA, VENTOLIN HFA, PROAIR HFA) 90 mcg/actuation inhaler, Take 2 Puffs by inhalation every four (4) hours as needed for Wheezing., Disp: 1 Inhaler, Rfl: 11  ???  esomeprazole (NEXIUM) 20 mg capsule, Take  by mouth daily., Disp: , Rfl:    Date Last Reviewed:  01/04/2017    EXAMINATION:   Observation/Orthostatic Postural Assessment:          Posture Assessment: Rounded shoulders, Forward head   Functional Mobility:         Gait/Mobility:    Base of Support: Center of gravity altered  Speed/Cadence: Pace decreased (<100 feet/min)  Step Length: Left shortened, Right shortened  Swing Pattern: Left asymmetrical, Right asymmetrical  Stance: Left increased, Right increased  Gait Abnormalities: Antalgic  Ambulation - Level of Assistance: Modified independent  Assistive Device: Cane, straight  ?? Interventions: Verbal cues, Safety awareness training          Transfers:     Sit to Stand: Modified independent  Stand to Sit: Modified independent  Stand Pivot Transfers: Modified independent  Bed to Chair: Modified independent  Lateral Transfers: Modified independent         Bed Mobility:     Rolling: Modified independent  Supine to Sit: Modified independent  Sit to Supine: Modified independent  Scooting: Modified independent      Strength:          UE STRENGTH: 3-/5 shoulder flexion, 3-/5 shoulder abduction, 3-/5 shoulder extension, 3-/5 elbow flexion, 3-/5 elbow extension.   LE STRENGTH:  3-/5 hip flexion, 3-/5 hip abduction, 3-/5 hip adduction, 3-/5 hip extension, 3-/5 knee extension, 3-/5 knee flexion, 3-/5 ankle dorsiflexion, 3-/5 ankle plantarflexion, 3-/5 ankle inversion, 3-/5 ankle eversion.  Sensation:         Decreased light touch sensation noted on right side versus left in UE and LE   Postural Control & Balance:?? Berg Balance Scale:  19/56.   (A score less than 45/56 indicates high risk of falls)     ?? Dynamic Gait Index:  9/24.   (A score less than or equal to19/24 is abnormal and predictive of falls)        CLINICAL DECISION MAKING:   Outcome Measure:   Tool Used: Berg Balance Scale  Score:  Initial: 19/56 Most Recent: X/56 (Date: -- )   Interpretation of Score: Each section is scored on a 0-4 scale, 0 representing the patient???s inability to perform the task and 4 representing independence.  The scores of each section are added together for a total score of 56.  The higher the patient???s score, the more independent the patient is.  Any score below 45 indicates increased risk for falls.  Score 56 55-45 44-34 33-23 22-12 11-1 0   Modifier CH CI CJ CK CL CM CN     Tool Used: Dynamic Gait Index  Score:  Initial: 9/24 Most Recent: X/24 (Date: -- )   Interpretation of Score: Each section is scored on a 0-3 scale, 0 representing the patient???s inability to perform the task and 3 representing independence.  The scores of each section are added together for a total score of 24.  Any score below 19 indicates increased risk for falls.  Score 24 23-19 18-15 14-10 9-5 4-1 0   Modifier CH CI CJ CK CL CM CN     ? Mobility - Walking and Moving Around:    E4540 - CURRENT STATUS: CL - 60%-79% impaired, limited or restricted   J8119 - GOAL STATUS: CJ - 20%-39% impaired, limited or restricted   J4782 - D/C STATUS:  ---------------To be determined---------------    Medical Necessity:   ?? Patient is expected to demonstrate progress in strength, range of motion, balance, coordination and functional technique to improve safety during daily activities.  ?? Patient demonstrates good rehab potential due to higher previous functional level.  ?? Skilled intervention continues to be required due to decreased mobility.  Reason for Services/Other Comments:  ?? Patient continues to demonstrate capacity to improve overall mobility  which will increase independence and increase safety.            TREATMENT:   (In addition to Assessment/Re-Assessment sessions the following treatments were rendered)  Pre-treatment Symptoms/Complaints:  01/04/2017: Patient reports she is tired today.  She reports she is going to need to leave a little early because her son is having a test and she wants to see the results.  Pain: Initial:   Pain Intensity 1: 0  Post Session:  0/10     THERAPEUTIC EXERCISE: (20 minutes):  Exercises per grid below to improve mobility, strength, balance and coordination.  Required minimal visual, verbal and manual cues to promote proper body alignment, promote proper body posture, promote proper body mechanics and promote proper body breathing techniques.  Progressed resistance, range, repetitions and complexity of movement as indicated.   Date:  01/04/2017   Activity/Exercise Parameters   Standing hip flexion 15 reps  B LE   Standing hip extension 15 reps  B LE   Standing hip abduction 15 reps  B LE   Standing knee flexion 15 reps  B LE   Seated LAQ 15 reps  B LE   Seated heel raises 15 reps  B LE   Seated toe raises 15 reps  B LE   Step ups in parallel bars 4 inch  5 reps   B LE     NEUROMUSCULAR RE-EDUCATION: (10 minutes):  Exercise/activities per grid below to improve balance and coordination.  Required minimal verbal and manual cues to promote static and dynamic balance in standing.    Date:  01/04/2017   Activity/Exercise Parameters   Marching in parallel bars    Side stepping in parallel bars    Walking backward in parallel bars    Step taps 4 inch  105reps  B LE   Step overs    Side step overs    Static stance with feet apart Eyes open  Eyes closed  10 seconds   Staggered stance Eyes open  10 second hold  B LE   Romberg stance        Treatment/Session Assessment:    ?? Response to Treatment:  Patient tolerated treatment with complaints of increased imbalance or fatigue.  Patient needed multiple rest breaks due to fatigue.     ?? Compliance with Program/Exercises: Will assess as treatment progresses.  ?? Recommendations/Intent for next treatment session: "Next visit will focus on advancements to more challenging activities".  Total Treatment Duration:  PT Patient Time In/Time Out  Time In: 1030  Time Out: 1115    Allene DillonJennifer N Anterrio Mccleery, PT    Future Appointments   Date Time Provider Department Center   03/24/2017 11:00 AM Stevie KernPatel, Sudhirkumar C, MD [facility]

## 2017-01-04 NOTE — Other (Signed)
Natalie Moon  DOB: 03/31/1929  [place]  Secondary: Emelda FearBshsi Aetna Senior Medicare S* [facility]  [address], [address], [place] [id number]  Phone:[phone]   Fax:[phone]          OUTPATIENT PHYSICAL THERAPY:Discontinuation Summary    ICD-10: Treatment Diagnosis:   ?? Other abnormalities of gait and mobility (R26.89)  ?? Difficulty in walking, not elsewhere classified (R26.2)  Precautions/Allergies:   Shellfish derived   Blood clots, COPD, falling, and severe arthritis  MD Orders: Evaluate and Treat MEDICAL/REFERRING DIAGNOSIS:  Hematoma of skin [T14.8XXA]  History of fall [Z91.81]   DATE OF ONSET: Chronic  REFERRING PHYSICIAN: Stevie KernPatel, Sudhirkumar C, MD  RETURN PHYSICIAN APPOINTMENT: TBD   ASSESSMENT:  [name] presented with decreased mobility and fall risk. Patient has attended a total of 4 scheduled physical therapy visits including initial evaluation on 12/08/2016.  Treatment consisted of balance and strengthening activiteies to improve overall mobility and performance with activities of daily living. Patient did not attend any additional physical therapy visits secondary to unknown reasons.  Patient's therapy will be discontinued at this time.  We will be happy to re-assess her with a change in her status and a new order from her doctor.  Thank you for the opportunity to serve this patient.       GOALS: (Goals have been discussed and agreed upon with patient.)  Short-Term Functional Goals: Time Frame: 30 days  1. Patient will be independent with home exercise program without exacerbation of symptoms or cueing needed--goal unable to be assessed.  Discharge Goals: Time Frame: 90 days  1. Patient will be independent with all ADLs with minimal fear of falling and loss of balance with daily tasks--goal unable to be assessed.  2. Patient will report no fear avoidance with social or recreational  activities due to fear of falling--goal unable to be assessed.  3. Patient will score greater than or equal to 39/56 on Berg Balance Scale with minimal effect of imbalance or fatigue on patient's ability to manage every day life activities--goal unable to be assessed.   4. Patient will score greater than or equal to 19/24 on Dynamic Gait Index with minimal effect of imbalance or fatigue on patient's ability to manage every day life activities--goal unable to be assessed.              EXAMINATION:   Observation/Orthostatic Postural Assessment:          Posture Assessment: Rounded shoulders, Forward head   Functional Mobility:         Gait/Mobility:    Base of Support: Center of gravity altered  Speed/Cadence: Pace decreased (<100 feet/min)  Step Length: Left shortened, Right shortened  Swing Pattern: Left asymmetrical, Right asymmetrical  Stance: Left increased, Right increased  Gait Abnormalities: Antalgic  Ambulation - Level of Assistance: Modified independent  Assistive Device: Cane, straight  ?? Interventions: Verbal cues, Safety awareness training          Transfers:     Sit to Stand: Modified independent  Stand to Sit: Modified independent  Stand Pivot Transfers: Modified independent  Bed to Chair: Modified independent  Lateral Transfers: Modified independent         Bed Mobility:     Rolling: Modified independent  Supine to Sit: Modified independent  Sit to Supine: Modified independent  Scooting: Modified independent      Strength:          UE  STRENGTH: 3-/5 shoulder flexion, 3-/5 shoulder abduction, 3-/5 shoulder extension, 3-/5 elbow flexion, 3-/5 elbow extension.   LE STRENGTH:  3-/5 hip flexion, 3-/5 hip abduction, 3-/5 hip adduction, 3-/5 hip extension, 3-/5 knee extension, 3-/5 knee flexion, 3-/5 ankle dorsiflexion, 3-/5 ankle plantarflexion, 3-/5 ankle inversion, 3-/5 ankle eversion.  Sensation:         Decreased light touch sensation noted on right side versus left in UE and LE   Postural Control & Balance:?? Berg Balance Scale:  19/56.   (A score less than 45/56 indicates high risk of falls)     ?? Dynamic Gait Index:  9/24.   (A score less than or equal to19/24 is abnormal and predictive of falls)        CLINICAL DECISION MAKING:   Outcome Measure:   Tool Used: Berg Balance Scale  Score:  Initial: 19/56 Most Recent: X/56 (Date: -- )   Interpretation of Score: Each section is scored on a 0-4 scale, 0 representing the patient???s inability to perform the task and 4 representing independence.  The scores of each section are added together for a total score of 56.  The higher the patient???s score, the more independent the patient is.  Any score below 45 indicates increased risk for falls.    Tool Used: Dynamic Gait Index  Score:  Initial: 9/24 Most Recent: X/24 (Date: -- )   Interpretation of Score: Each section is scored on a 0-3 scale, 0 representing the patient???s inability to perform the task and 3 representing independence.  The scores of each section are added together for a total score of 24.  Any score below 19 indicates increased risk for falls.

## 2017-01-04 NOTE — Discharge Instructions (Signed)
Therapy Discharge by Allene DillonSams, Jennifer N, PT at 01/04/17 2359                Author: Allene DillonSams, Jennifer N, PT  Service: --  Author Type: Physical Therapist       Filed: 07/06/17 1604  Date of Service: 01/04/17 2359  Status: Signed          Editor: Allene DillonSams, Jennifer N, PT (Physical Therapist)                  Kelli Churnorothy M Balinski   DOB: 01/29/1930   [place]   Secondary: Bsh[place] Aetna Senior Medicare S*  [facility]   [address], [address], [place] [id number]   Phone:[phone]   Fax:[phone]                      OUTPATIENT PHYSICAL THERAPY:Discontinuation Summary          ICD-10: Treatment Diagnosis:      Other abnormalities of gait and mobility (R26.89)     Difficulty in walking, not elsewhere classified (R26.2)   Precautions /Allergies:    Shellfish derived    Blood clots, COPD, falling, and severe arthritis   MD Orders:  Evaluate and Treat  MEDICAL/REFERRING DIAGNOSIS:   Hematoma of skin [T14.8XXA]   History of fall [Z91.81]    DATE OF ONSET:  Chronic   REFERRING PHYSICIAN: Stevie KernPatel, Sudhirkumar C, MD   RETURN PHYSICIAN APPOINTMENT:  TBD          ASSESSMENT:  Natalie Moon  presented with decreased mobility and fall risk. Patient has attended a total of 4 scheduled physical therapy visits including initial evaluation  on 12/08/2016.  Treatment consisted of balance and strengthening activiteies to improve overall mobility and performance with activities of daily living. Patient did not attend any additional physical therapy visits secondary to unknown reasons.  Patient's  therapy will be discontinued at this time.  We will be happy to re-assess her with a change in  her status and a new order from her doctor.  Thank you for the opportunity to serve this  patient.            GOALS: (Goals have been discussed and agreed  upon with patient.)   Short-Term Functional Goals : Time Frame: 30 days   1.  Patient will be independent with home exercise program without  exacerbation of symptoms or cueing needed--goal unable to be assessed.   Discharge Goals : Time Frame: 90 days   1.  Patient will be independent with all ADLs with minimal fear of falling and loss of balance with daily tasks--goal unable to be  assessed.   2.  Patient will report no fear avoidance with social or recreational activities due to fear of falling--goal unable to be assessed.   3.  Patient will score greater than or equal to 39/56 on Berg Balance Scale  with minimal effect of imbalance or fatigue  on patient's ability to manage every day life activities--goal unable to be assessed.    4.  Patient will score greater than or equal to 19/24 on Dynamic Gait Index  with minimal effect of imbalance or fatigue  on patient's ability to manage every day life activities--goal unable to be assessed.                             EXAMINATION:  Observation/Orthostatic Postural Assessment:           Posture Assessment: Rounded shoulders, Forward head    Functional Mobility:          Gait/Mobility:      Base of Support: Center of gravity altered   Speed/Cadence: Pace decreased (<100 feet/min)   Step Length: Left shortened, Right shortened   Swing Pattern: Left asymmetrical, Right asymmetrical   Stance: Left increased, Right increased   Gait Abnormalities: Antalgic   Ambulation - Level of Assistance: Modified independent   Assistive Device: Cane, straight     Interventions: Verbal cues, Safety awareness training             Transfers:       Sit to Stand: Modified independent   Stand to Sit: Modified independent   Stand Pivot Transfers: Modified independent   Bed to Chair: Modified independent   Lateral Transfers: Modified independent            Bed Mobility:       Rolling: Modified independent   Supine to Sit: Modified independent   Sit to Supine: Modified independent   Scooting: Modified independent          Strength:           UE STRENGTH: 3-/5 shoulder flexion, 3-/5 shoulder abduction,  3-/5 shoulder extension,  3-/5 elbow flexion, 3-/5 elbow extension.    LE STRENGTH:  3- /5 hip flexion, 3-/5 hip abduction, 3- /5 hip adduction, 3-/5 hip extension, 3- /5 knee extension, 3-/5 knee flexion, 3- /5 ankle dorsiflexion, 3-/5 ankle plantarflexion, 3- /5 ankle inversion, 3-/5 ankle eversion.   Sensation:          Decreased light touch sensation noted on right side versus left in UE and LE   Postural Control & Balance :     Berg Balance Scale:   19/56.   (A score  less than 45/56 indicates high risk of falls)        Dynamic Gait Index:   9/24.   (A score less than or equal to19/24 is abnormal and predictive of  falls)             CLINICAL DECISION MAKING:      Outcome Measure:    Tool Used: Berg Balance Scale   Score:   Initial: 19 /56  Most Recent: X /56 (Date: -- )      Interpretation of Score: Each section is scored on a 0-4 scale,  0 representing the patients inability to perform the task and 4 representing independence.  The scores of each section are added together for a total score of 56.  The higher the patients score, the more independent the patient is.   Any score below 45 indicates increased risk for falls.      Tool Used: Dynamic Gait Index   Score:   Initial: 9 /24  Most Recent: X /24 (Date: -- )      Interpretation of Score: Each section is scored on a 0-3 scale,  0 representing the patients inability to perform the task and 3 representing independence.  The scores of each section are added together for a total score of 24.  Any score below 19 indicates increased risk for falls.

## 2017-01-19 NOTE — Progress Notes (Signed)
[facility]rapy Center at [facility]   [facility] [address]  [address], [address] [place], [place] [address]  Phone: [phone]   Fax: [phone]    OUTPATIENT DAILY NOTE    NAME/AGE/GENDER: Natalie Moon is a 83 y.o. female.     DATE: 01/19/2017    Patient cancelled her appointment for today due to unknown reasons.  Will plan to follow up on next scheduled visit.    Allene DillonJennifer N Guilianna Mckoy, PT

## 2017-01-20 ENCOUNTER — Inpatient Hospital Stay: Payer: MEDICARE | Primary: Specialist

## 2017-01-26 NOTE — Progress Notes (Signed)
[facility]   Medical Office Building [address], [address] [place], [place] [id number]  Phone: [phone]   Fax: [phone]    OUTPATIENT DAILY NOTE    NAME/AGE/GENDER: Natalie Moon is a 83 y.o. female.     DATE: 01/26/2017    Patient did not show for her appointment for today due to unknown reasons.  Will plan to follow up on next scheduled visit.    Allene DillonJennifer N Chianna Spirito, PT

## 2017-01-27 ENCOUNTER — Inpatient Hospital Stay: Payer: MEDICARE | Primary: Specialist

## 2017-02-02 MED ORDER — CELECOXIB 200 MG CAP
200 mg | ORAL_CAPSULE | Freq: Every day | ORAL | 3 refills | Status: AC
Start: 2017-02-02 — End: ?

## 2017-02-02 NOTE — Telephone Encounter (Signed)
Refilled per Dr. Patel.

## 2017-03-13 ENCOUNTER — Encounter

## 2017-03-15 ENCOUNTER — Encounter

## 2017-03-15 MED ORDER — CLOPIDOGREL 75 MG TAB
75 mg | ORAL_TABLET | Freq: Every day | ORAL | 3 refills | Status: AC
Start: 2017-03-15 — End: ?

## 2017-03-15 NOTE — Telephone Encounter (Signed)
Orders Placed This Encounter   ??? clopidogrel (PLAVIX) 75 mg tab     Sig: Take 1 Tab by mouth daily.     Dispense:  90 Tab     Refill:  3     Per dr

## 2017-03-24 ENCOUNTER — Ambulatory Visit: Admit: 2017-03-24 | Discharge: 2017-03-24 | Payer: MEDICARE | Attending: Specialist | Primary: Specialist

## 2017-03-24 DIAGNOSIS — J209 Acute bronchitis, unspecified: Secondary | ICD-10-CM

## 2017-03-24 MED ORDER — DEXTROMETHORPHAN-GUAIFENESIN SR 30 MG-600 MG 12 HR TAB
600-30 mg | ORAL_TABLET | Freq: Two times a day (BID) | ORAL | 1 refills | Status: DC
Start: 2017-03-24 — End: 2017-04-25

## 2017-03-24 MED ORDER — AMOXICILLIN CLAVULANATE 875 MG-125 MG TAB
875-125 mg | ORAL_TABLET | Freq: Two times a day (BID) | ORAL | 0 refills | Status: AC
Start: 2017-03-24 — End: 2017-03-31

## 2017-03-24 NOTE — Progress Notes (Signed)
Patient has an ACP.  I asked patient to bring a copy with her to her next appointment.
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
## 2017-04-20 NOTE — Telephone Encounter (Signed)
[facility] called and asked for another order for PT for pt, due to a problem with her insurance. I gave message to Dr.Patel and he said okay. I called [facility] back and gave the VO and they said they would fax paper for Dr.Patel to sign.

## 2017-04-25 ENCOUNTER — Ambulatory Visit: Admit: 2017-04-25 | Discharge: 2017-04-25 | Payer: MEDICARE | Attending: Specialist | Primary: Specialist

## 2017-04-25 DIAGNOSIS — I10 Essential (primary) hypertension: Secondary | ICD-10-CM

## 2017-04-25 MED ORDER — DULOXETINE 60 MG CAP, DELAYED RELEASE
60 mg | ORAL_CAPSULE | Freq: Every day | ORAL | 3 refills | Status: AC
Start: 2017-04-25 — End: ?

## 2017-04-25 MED ORDER — FERROUS FUMARATE 324 MG (106 MG IRON) TABLET
324 mg (106 mg iron) | ORAL_TABLET | Freq: Every day | ORAL | 4 refills | Status: AC
Start: 2017-04-25 — End: ?
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
## 2017-05-23 ENCOUNTER — Ambulatory Visit: Admit: 2017-05-23 | Discharge: 2017-05-23 | Payer: MEDICARE | Attending: Cardiovascular Disease | Primary: Specialist

## 2017-05-23 DIAGNOSIS — I2510 Atherosclerotic heart disease of native coronary artery without angina pectoris: Secondary | ICD-10-CM

## 2017-05-23 MED ORDER — ISOSORBIDE MONONITRATE SR 30 MG 24 HR TAB
30 mg | ORAL_TABLET | ORAL | 11 refills | Status: DC
Start: 2017-05-23 — End: 2017-12-11
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
## 2017-06-27 ENCOUNTER — Encounter: Payer: MEDICARE | Attending: Specialist | Primary: Specialist

## 2017-06-27 ENCOUNTER — Encounter: Attending: Specialist | Primary: Specialist

## 2017-10-22 ENCOUNTER — Inpatient Hospital Stay
Admit: 2017-10-22 | Discharge: 2017-10-22 | Disposition: A | Discharge status: Home / Self Care | Payer: MEDICARE | Attending: Emergency Medicine

## 2017-10-22 DIAGNOSIS — M62838 Other muscle spasm: Secondary | ICD-10-CM

## 2017-10-22 LAB — CBC WITH AUTO DIFFERENTIAL
Basophils %: 1 % (ref 0.0–2.0)
Basophils Absolute: 0 10*3/uL (ref 0.0–0.2)
Eosinophils %: 2 % (ref 0.5–7.8)
Eosinophils Absolute: 0.1 10*3/uL (ref 0.0–0.8)
Granulocyte Absolute Count: 0 10*3/uL (ref 0.0–0.5)
Hematocrit: 34.7 % — ABNORMAL LOW (ref 35.8–46.3)
Hemoglobin: 11.3 g/dL — ABNORMAL LOW (ref 11.7–15.4)
Immature Granulocytes: 1 % (ref 0.0–5.0)
Lymphocytes %: 21 % (ref 13–44)
Lymphocytes Absolute: 0.9 10*3/uL (ref 0.5–4.6)
MCH: 31.4 PG (ref 26.1–32.9)
MCHC: 32.6 g/dL (ref 31.4–35.0)
MCV: 96.4 FL (ref 79.6–97.8)
MPV: 10.7 FL (ref 9.4–12.3)
Monocytes %: 9 % (ref 4.0–12.0)
Monocytes Absolute: 0.4 10*3/uL (ref 0.1–1.3)
NRBC Absolute: 0 10*3/uL (ref 0.0–0.2)
Neutrophils %: 67 % (ref 43–78)
Neutrophils Absolute: 2.8 10*3/uL (ref 1.7–8.2)
Platelets: 209 10*3/uL (ref 150–450)
RBC: 3.6 M/uL — ABNORMAL LOW (ref 4.05–5.2)
RDW: 13.9 % (ref 11.9–14.6)
WBC: 4.1 10*3/uL — ABNORMAL LOW (ref 4.3–11.1)

## 2017-10-22 LAB — COMPREHENSIVE METABOLIC PANEL
ALT: 16 U/L (ref 12–65)
AST: 18 U/L (ref 15–37)
Albumin/Globulin Ratio: 0.8 — ABNORMAL LOW (ref 1.2–3.5)
Albumin: 3.3 g/dL (ref 3.2–4.6)
Alkaline Phosphatase: 107 U/L (ref 50–136)
Anion Gap: 6 mmol/L — ABNORMAL LOW (ref 7–16)
BUN: 20 MG/DL (ref 8–23)
CO2: 29 mmol/L (ref 21–32)
Calcium: 8.8 MG/DL (ref 8.3–10.4)
Chloride: 107 mmol/L (ref 98–107)
Creatinine: 1.15 MG/DL — ABNORMAL HIGH (ref 0.6–1.0)
EGFR IF NonAfrican American: 48 mL/min/{1.73_m2} — ABNORMAL LOW (ref >60)
GFR African American: 58 mL/min/{1.73_m2} — ABNORMAL LOW (ref >60)
Globulin: 4.1 g/dL — ABNORMAL HIGH (ref 2.3–3.5)
Glucose: 115 mg/dL — ABNORMAL HIGH (ref 65–100)
Potassium: 4.3 mmol/L (ref 3.5–5.1)
Sodium: 142 mmol/L (ref 136–145)
Total Bilirubin: 0.4 MG/DL (ref 0.2–1.1)
Total Protein: 7.4 g/dL (ref 6.3–8.2)

## 2017-10-22 LAB — MAGNESIUM
Magnesium: 2.3 mg/dL (ref 1.8–2.4)
Magnesium: 2.3 mg/dL (ref 1.8–2.4)

## 2017-10-22 LAB — CK
CK: 112 U/L (ref 21–215)
Total CK: 112 U/L (ref 21–215)

## 2017-10-22 LAB — CBC WITH AUTOMATED DIFF
ABS. BASOPHILS: 0 10*3/uL (ref 0.0–0.2)
ABS. EOSINOPHILS: 0.1 10*3/uL (ref 0.0–0.8)
ABS. IMM. GRANS.: 0 10*3/uL (ref 0.0–0.5)
ABS. LYMPHOCYTES: 0.9 10*3/uL (ref 0.5–4.6)
ABS. MONOCYTES: 0.4 10*3/uL (ref 0.1–1.3)
ABS. NEUTROPHILS: 2.8 10*3/uL (ref 1.7–8.2)
ABSOLUTE NRBC: 0 10*3/uL (ref 0.0–0.2)
BASOPHILS: 1 % (ref 0.0–2.0)
EOSINOPHILS: 2 % (ref 0.5–7.8)
HCT: 34.7 % — ABNORMAL LOW (ref 35.8–46.3)
HGB: 11.3 g/dL — ABNORMAL LOW (ref 11.7–15.4)
IMMATURE GRANULOCYTES: 1 % (ref 0.0–5.0)
LYMPHOCYTES: 21 % (ref 13–44)
MCH: 31.4 PG (ref 26.1–32.9)
MCHC: 32.6 g/dL (ref 31.4–35.0)
MCV: 96.4 FL (ref 79.6–97.8)
MONOCYTES: 9 % (ref 4.0–12.0)
MPV: 10.7 FL (ref 9.4–12.3)
NEUTROPHILS: 67 % (ref 43–78)
PLATELET: 209 10*3/uL (ref 150–450)
RBC: 3.6 M/uL — ABNORMAL LOW (ref 4.05–5.2)
RDW: 13.9 % (ref 11.9–14.6)
WBC: 4.1 10*3/uL — ABNORMAL LOW (ref 4.3–11.1)

## 2017-10-22 LAB — METABOLIC PANEL, COMPREHENSIVE
A-G Ratio: 0.8 — ABNORMAL LOW (ref 1.2–3.5)
ALT (SGPT): 16 U/L (ref 12–65)
AST (SGOT): 18 U/L (ref 15–37)
Albumin: 3.3 g/dL (ref 3.2–4.6)
Alk. phosphatase: 107 U/L (ref 50–136)
Anion gap: 6 mmol/L — ABNORMAL LOW (ref 7–16)
BUN: 20 MG/DL (ref 8–23)
Bilirubin, total: 0.4 MG/DL (ref 0.2–1.1)
CO2: 29 mmol/L (ref 21–32)
Calcium: 8.8 MG/DL (ref 8.3–10.4)
Chloride: 107 mmol/L (ref 98–107)
Creatinine: 1.15 MG/DL — ABNORMAL HIGH (ref 0.6–1.0)
GFR est AA: 58 mL/min/{1.73_m2} — ABNORMAL LOW (ref >60)
GFR est non-AA: 48 mL/min/{1.73_m2} — ABNORMAL LOW (ref >60)
Globulin: 4.1 g/dL — ABNORMAL HIGH (ref 2.3–3.5)
Glucose: 115 mg/dL — ABNORMAL HIGH (ref 65–100)
Potassium: 4.3 mmol/L (ref 3.5–5.1)
Protein, total: 7.4 g/dL (ref 6.3–8.2)
Sodium: 142 mmol/L (ref 136–145)

## 2017-10-22 MED ORDER — METAXALONE 800 MG TAB
800 mg | ORAL_TABLET | Freq: Three times a day (TID) | ORAL | 0 refills | Status: DC
Start: 2017-10-22 — End: 2017-11-09

## 2017-10-22 MED ORDER — TRAMADOL 50 MG TAB
50 mg | ORAL | Status: AC
Start: 2017-10-22 — End: 2017-10-22
  Administered 2017-10-22: 15:00:00 via ORAL

## 2017-10-22 MED ORDER — METAXALONE 800 MG TAB
800 mg | Freq: Once | ORAL | Status: AC
Start: 2017-10-22 — End: 2017-10-22
  Administered 2017-10-22: 15:00:00 via ORAL

## 2017-10-22 MED FILL — TRAMADOL 50 MG TAB: 50 mg | ORAL | Qty: 1

## 2017-10-22 MED FILL — METAXALONE 800 MG TAB: 800 mg | ORAL | Qty: 1

## 2017-10-22 NOTE — ED Notes (Signed)
Pt ambulated with moderated difficulty due to pain, states pain is decreasing as medication kicks in. States she is ready to go home.

## 2017-10-22 NOTE — ED Notes (Signed)
I have reviewed discharge instructions with the patient.  The patient verbalized understanding.    Patient left ED via Discharge Method: wheelchair to Home with family.     Opportunity for questions and clarification provided.       Patient given 1 scripts.         To continue your aftercare when you leave the hospital, you may receive an automated call from our care team to check in on how you are doing.  This is a free service and part of our promise to provide the best care and service to meet your aftercare needs.??? If you have questions, or wish to unsubscribe from this service please call [phone].  Thank you for Choosing our [facility].
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
## 2017-10-22 NOTE — ED Notes (Signed)
Pt unable to stand due to pain in leg, states symptoms are slightly better since medication. Heating pad placed to area and will attempt to ambulate again soon.

## 2017-10-22 NOTE — ED Triage Notes (Signed)
Patient brought in via EMS from [place] for pain to left calf. Left foot turned inward and muscles appear to be tight in lower leg during triage. Denies falls of trauma

## 2017-10-22 NOTE — ED Notes (Signed)
Pt unable to stand due to pain in leg, states symptoms are slightly better since medication. Heating pad placed to area and will attempt to ambulate again soon.

## 2017-10-22 NOTE — ED Notes (Signed)
 I have reviewed discharge instructions with the patient.  The patient verbalized understanding.    Patient left ED via Discharge Method: wheelchair to Home with family.    Opportunity for questions and clarification provided.       Patient given 1 scripts.         To continue your aftercare when you leave the hospital, you may receive an automated call from our care team to check in on how you are doing.  This is a free service and part of our promise to provide the best care and service to meet your aftercare needs." If you have questions, or wish to unsubscribe from this service please call [phone].  Thank you for Choosing our [facility] Emergency Department.

## 2017-10-22 NOTE — ED Notes (Signed)
Pt ambulated with moderated difficulty due to pain, states pain is decreasing as medication kicks in. States she is ready to go home.
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
## 2017-10-22 NOTE — ED Notes (Signed)
Patient brought in via EMS from [facility] for pain to left calf. Left foot turned inward and muscles appear to be tight in lower leg during triage. Denies falls of trauma

## 2017-11-06 MED ORDER — LOSARTAN 100 MG TAB
100 mg | ORAL_TABLET | ORAL | 4 refills | Status: AC
Start: 2017-11-06 — End: ?

## 2017-11-09 ENCOUNTER — Inpatient Hospital Stay: Payer: MEDICARE | Attending: Specialist | Primary: Specialist

## 2017-11-09 ENCOUNTER — Emergency Department: Admit: 2017-11-09 | Payer: MEDICARE | Primary: Specialist

## 2017-11-09 ENCOUNTER — Inpatient Hospital Stay
Admit: 2017-11-09 | Discharge: 2017-11-09 | Disposition: A | Discharge status: Home / Self Care | Payer: MEDICARE | Attending: Emergency Medicine

## 2017-11-09 ENCOUNTER — Ambulatory Visit: Admit: 2017-11-09 | Discharge: 2017-11-09 | Payer: MEDICARE | Attending: Specialist | Primary: Specialist

## 2017-11-09 ENCOUNTER — Ambulatory Visit: Attending: Specialist | Primary: Specialist

## 2017-11-09 DIAGNOSIS — M79662 Pain in left lower leg: Secondary | ICD-10-CM

## 2017-11-09 DIAGNOSIS — M5442 Lumbago with sciatica, left side: Secondary | ICD-10-CM

## 2017-11-09 MED ORDER — HYDROCODONE-ACETAMINOPHEN 7.5 MG-325 MG TAB
ORAL_TABLET | Freq: Four times a day (QID) | ORAL | 0 refills | Status: AC | PRN
Start: 2017-11-09 — End: 2017-11-14

## 2017-11-09 MED ORDER — METAXALONE 800 MG TAB
800 mg | ORAL_TABLET | Freq: Three times a day (TID) | ORAL | 0 refills | Status: DC
Start: 2017-11-09 — End: 2017-11-14

## 2017-11-09 NOTE — Telephone Encounter (Signed)
Negative US-DVT per imaging- Dr Allena Katz notified.

## 2017-11-09 NOTE — ED Triage Notes (Signed)
Reports muscle spasms to left leg.  States was seen on 9/22 for same and given meds. States when she stopped the meds it came back.
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
## 2017-11-09 NOTE — ED Notes (Signed)
I have reviewed discharge instructions with the patient.  The patient verbalized understanding.    Patient left ED via Discharge Method: ambulatory to Home with self.    Opportunity for questions and clarification provided.       Patient given 1 scripts.  No e-sign.          To continue your aftercare when you leave the hospital, you may receive an automated call from our care team to check in on how you are doing.  This is a free service and part of our promise to provide the best care and service to meet your aftercare needs.??? If you have questions, or wish to unsubscribe from this service please call [phone].  Thank you for Choosing our [facility].
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
## 2017-11-09 NOTE — ED Notes (Signed)
Reports muscle spasms to left leg.  States was seen on 9/22 for same and given meds. States when she stopped the meds it came back.

## 2017-11-09 NOTE — ED Notes (Signed)
I have reviewed discharge instructions with the patient.  The patient verbalized understanding.    Patient left ED via Discharge Method: ambulatory to Home with self    Opportunity for questions and clarification provided.       Patient given 1 scripts. No esign        To continue your aftercare when you leave the hospital, you may receive an automated call from our care team to check in on how you are doing.  This is a free service and part of our promise to provide the best care and service to meet your aftercare needs." If you have questions, or wish to unsubscribe from this service please call [phone].  Thank you for Choosing our [facility] Emergency Department.

## 2017-11-14 MED ORDER — TIZANIDINE 4 MG TAB
4 mg | ORAL_TABLET | Freq: Two times a day (BID) | ORAL | 0 refills | Status: AC
Start: 2017-11-14 — End: 2017-11-29

## 2017-11-14 NOTE — Telephone Encounter (Signed)
Orders Placed This Encounter   ??? tiZANidine (ZANAFLEX) 4 mg tablet     Sig: Take 1 Tab by mouth two (2) times a day for 15 days.     Dispense:  30 Tab     Refill:  0     Per DR- change from [facility].

## 2017-11-15 ENCOUNTER — Ambulatory Visit: Admit: 2017-11-15 | Discharge: 2017-11-15 | Payer: MEDICARE | Attending: Specialist | Primary: Specialist

## 2017-11-15 ENCOUNTER — Ambulatory Visit: Attending: Specialist | Primary: Specialist

## 2017-11-15 DIAGNOSIS — I10 Essential (primary) hypertension: Secondary | ICD-10-CM

## 2017-11-15 MED ORDER — PREDNISONE 10 MG TABLETS IN A DOSE PACK
10 mg | ORAL_TABLET | ORAL | 0 refills | Status: AC
Start: 2017-11-15 — End: ?
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
## 2017-11-17 NOTE — Telephone Encounter (Signed)
Pt states her daughter is moving her to [place] to be with her. Pt needs a reference for a cardiologist in [place]

## 2017-11-21 NOTE — Telephone Encounter (Signed)
Julien Nordmann MD with [facility] looks like a good fellow otherwise I dont know anyone in particular

## 2017-11-21 NOTE — Telephone Encounter (Signed)
Patient notified. /dd

## 2017-11-24 ENCOUNTER — Encounter: Attending: Cardiovascular Disease | Primary: Specialist

## 2017-12-11 ENCOUNTER — Encounter

## 2017-12-11 MED ORDER — ISOSORBIDE MONONITRATE SR 30 MG 24 HR TAB
30 mg | ORAL_TABLET | ORAL | 11 refills | Status: AC
Start: 2017-12-11 — End: ?

## 2017-12-11 NOTE — Telephone Encounter (Signed)
PATIENT IN PROCESS OF RELOCATING TO BE NEAR HER DAUGHTER.     PLEASE SEND RX TO:    [facility]  - [address]   [place], [place]    PHONE #[phone]

## 2017-12-18 ENCOUNTER — Encounter: Payer: Self-pay | Admitting: Internal Medicine

## 2018-01-01 ENCOUNTER — Telehealth: Payer: Self-pay

## 2018-01-01 NOTE — Telephone Encounter (Signed)
Please review

## 2018-01-01 NOTE — Telephone Encounter (Signed)
Ok--It will be January before I can see her.

## 2018-01-01 NOTE — Telephone Encounter (Signed)
Pt's son-in-law Elyse Jarvis) would like to establish Ms. Riker with Dr. Rosanna Randy. Mr. Leeanne Deed states he has already spoken with [facility] about this.  Please advise.   Thanks,   -Mickel Baas

## 2018-01-02 NOTE — Telephone Encounter (Signed)
[facility] office aware and making appointment

## 2018-01-16 ENCOUNTER — Encounter: Payer: Self-pay | Admitting: Internal Medicine

## 2018-01-16 ENCOUNTER — Ambulatory Visit (INDEPENDENT_AMBULATORY_CARE_PROVIDER_SITE_OTHER): Payer: Medicare Other | Admitting: Internal Medicine

## 2018-01-16 VITALS — BP 122/56 | HR 71 | Ht 65.0 in | Wt 179.0 lb

## 2018-01-16 DIAGNOSIS — K219 Gastro-esophageal reflux disease without esophagitis: Secondary | ICD-10-CM | POA: Diagnosis not present

## 2018-01-16 DIAGNOSIS — E8801 Alpha-1-antitrypsin deficiency: Secondary | ICD-10-CM | POA: Diagnosis not present

## 2018-01-16 DIAGNOSIS — D120 Benign neoplasm of cecum: Secondary | ICD-10-CM

## 2018-01-16 NOTE — Patient Instructions (Addendum)
[facility] in [place] for their information.   Follow up with Dr Carlean Purl as needed.  I appreciate the opportunity to care for you. Silvano Rusk, MD, [facility]

## 2018-01-16 NOTE — Progress Notes (Signed)
Terri Braun       Terri Braun 84 y.o. 06/26/1929 [id number]  Assessment & Plan:   Encounter Diagnoses  Name Primary?  . Benign neoplasm of cecum Yes  . Heterozygous alpha 1-antitrypsin deficiency ([place])   . Gastroesophageal reflux disease, esophagitis presence not specified     I would not perform a colonoscopy or surgery on this patient.  She is frail, needed help getting on the exam table she would not tolerate any type of complication or medical difficulty well.  She and her daughter agree with this plan  Based upon the information I have available I do not think she has significant liver disease or cirrhosis.  He does have a positive anti-smooth muscle antibody and his history of hepatitis so perhaps she does have chronic liver disease but it does not seem to be affecting her if so and I cannot see evidence in her labs or exam.  History of varices either.  Did not do any laboratory testing today but will defer to her new primary care provider Dr. Rosanna Randy.  Should I need to be involved subsequently I would be happy to see her again.  He was given information about [facility] in [place] today to schedule patient there for her lung problems.  I appreciate the opportunity to care for this patient. CC: Terri Braun., MD  Subjective:   Chief Complaint: Colon polyp  HPI This is a very nice lady here with her daughter, she moved from [place] to [facility] within the past few months, she has a GI history notable for GERD status post Nissen fundoplication, liver disease with a biopsy showing hepatitis in the past and MZ alpha-1 antitrypsin phenotype, and she has a sessile serrated polyp of the cecum that has been biopsied in 2013 in 2017 but not removed.  2013 it was described as 1.6 cm x 2 cm and was not removed because of "embedded nature, need to resume Plavix, etc.".  It was reportedly best seen via NBI technique.  She also had diverticulosis and  internal hemorrhoids (these do bleed from time to time) she has had some smaller adenomas removed over the years as well.  In 2017 the polyp was again a sessile serrated polyp and described as 1.2 cm.  She wonders if she needs to have that removed.  It had been suggested but she declined.  She refused surgery and did not want to repeat a colonoscopy when offered earlier this year by her doctor in [place], dr. Marcello Moores.  I reviewed multiple records regarding her GI problems and some of her other medical issues.  There is also a question of liver disease, as mentioned above.  She thinks she has cirrhosis I can see no evidence for that on review of imaging laboratory studies.  The daughter tells me that they think that liver problems are probably related to the alpha 1 antitrypsin  MZ phenotype that the lung problems are also.  She gets short of breath walking from room to room and has to ambulate with a cane, she says she has a severe muscle spasm in her left leg that has not totally improved and is unable to climb steps.  She has coronary artery disease and uses as needed nitroglycerin and takes Plavix, she has a right coronary artery stent.  He is not having any active GI symptoms other that her stomach feels "funny" at times and it seems like that is been an issue ever since  she had her fundoplication.  Is had multiple colonoscopies EGDs and a capsule endoscopy of the small bowel in the past.  Multiple imaging studies.  Though she believes she has cirrhosis I cannot see any evidence for that in labs biopsies or imaging studies that have been reviewed today.  She will soon see Dr. Rosanna Randy to establish primary care, she has a new patient appointment with Dr. Rockey Situ of cardiology and is interested in obtaining a pulmonary physician for her COPD. Allergies  Allergen Reactions  . Shellfish Allergy     Ended up in the ED after eating shellfish, caused diarrhea and severe GI upset , unsure if caused  SOB   Current Meds  Medication Sig  . celecoxib (CELEBREX) 200 MG capsule Take 200 mg by mouth daily.  . clopidogrel (PLAVIX) 75 MG tablet Take 75 mg by mouth daily.  . DULoxetine (CYMBALTA) 60 MG capsule Take 60 mg by mouth daily.  Terri Braun esomeprazole (NEXIUM) 20 MG capsule Take 20 mg by mouth daily at 12 noon.  . isosorbide mononitrate (IMDUR) 30 MG 24 hr tablet Take 30 mg by mouth daily.   Past Medical History:  Diagnosis Date  . Anemia   . ASCVD (arteriosclerotic cardiovascular disease)   . COPD (chronic obstructive pulmonary disease) ([place])   . Diverticulosis   . GERD (gastroesophageal reflux disease)   . Hypertension   . Hypothyroidism    Past Surgical History:  Procedure Laterality Date  . APPENDECTOMY    . CHOLECYSTECTOMY    . COLONOSCOPY  11/04/2015  . CORONARY ANGIOPLASTY WITH STENT PLACEMENT    . NISSEN FUNDOPLICATION    . TOTAL ABDOMINAL HYSTERECTOMY     Social History   Social History Narrative   She is widowed she has sons and daughters, she moved here from [place] to live in [place] near her daughter   2 caffeinated beverages a day   Rare alcohol   Never smoker   family history includes Coronary artery disease in her mother; Crohn's disease in her son.   Review of Systems As per HPI.  GI, cardiopulmonary is covered there as it is musculoskeletal\ All other review of systems are negative  Objective:   Physical Exam BP (!) 122/56   Pulse 71   Ht 5\' 5"  (1.651 m)   Wt 179 lb (81.2 kg)   BMI 29.79 kg/m  Frail elderly ww - needs help getting on exam table Uses a cane Pursed lip breathing Eyes anicteric Mouth clear Cor Nl and distant Kyphotic w/ clear lungs abd soft NT no HSM/mass Ext no c/c/e No stigmata neck liver disease in the skin Speech clear, alert and oriented x3 appropriate mood and affect  Tensive data reviewed as summarized in the HPI and assessment and plan.  I have reviewed gastroenterology notes going back many years  from 2019 back.  Endoscopy and colonoscopy reports pathology reports imaging reports and office notes and lab tests.  This will be scanned into our system.  CBC in January 2018 normal alpha-1 antitrypsin phenotype 2017 MZ with level 103 low normal positive anti-smooth muscle antibody 37 units ferritin 12, ANA negative, mitochondrial antibodies negative.  Liver function tests normal in 2018

## 2018-02-12 ENCOUNTER — Ambulatory Visit (INDEPENDENT_AMBULATORY_CARE_PROVIDER_SITE_OTHER): Payer: Medicare Other | Admitting: Family Medicine

## 2018-02-12 ENCOUNTER — Encounter: Payer: Self-pay | Admitting: Family Medicine

## 2018-02-12 VITALS — BP 128/64 | Temp 98.4°F | Resp 16 | Wt 177.0 lb

## 2018-02-12 DIAGNOSIS — I2510 Atherosclerotic heart disease of native coronary artery without angina pectoris: Secondary | ICD-10-CM

## 2018-02-12 DIAGNOSIS — J449 Chronic obstructive pulmonary disease, unspecified: Secondary | ICD-10-CM

## 2018-02-12 DIAGNOSIS — M2550 Pain in unspecified joint: Secondary | ICD-10-CM

## 2018-02-12 DIAGNOSIS — I10 Essential (primary) hypertension: Secondary | ICD-10-CM

## 2018-02-12 DIAGNOSIS — D649 Anemia, unspecified: Secondary | ICD-10-CM | POA: Insufficient documentation

## 2018-02-12 DIAGNOSIS — I25118 Atherosclerotic heart disease of native coronary artery with other forms of angina pectoris: Secondary | ICD-10-CM | POA: Insufficient documentation

## 2018-02-12 DIAGNOSIS — J432 Centrilobular emphysema: Secondary | ICD-10-CM | POA: Insufficient documentation

## 2018-02-12 DIAGNOSIS — E8801 Alpha-1-antitrypsin deficiency: Secondary | ICD-10-CM

## 2018-02-12 DIAGNOSIS — K219 Gastro-esophageal reflux disease without esophagitis: Secondary | ICD-10-CM

## 2018-02-12 DIAGNOSIS — D509 Iron deficiency anemia, unspecified: Secondary | ICD-10-CM

## 2018-02-12 DIAGNOSIS — Z148 Genetic carrier of other disease: Secondary | ICD-10-CM

## 2018-02-12 MED ORDER — CELECOXIB 100 MG PO CAPS: 100.0000 mg | ORAL_CAPSULE | Freq: Every day | ORAL | 11 refills | Status: DC

## 2018-02-12 NOTE — Progress Notes (Addendum)
Patient: Terri Braun Female    DOB: 31-May-1929   85 y.o.   MRN: [id number] Visit Date: 02/12/2018  Today's Provider: [name], MD   Chief Complaint  Patient presents with  . New Patient (Initial Visit)   Subjective:     HPI  Patient comes in today accompanied with her daughter to establish care into the practice. Patient reports that she had a fall about 2 weeks ago and still has soreness in her left leg. She was also seen by GI about 1 month ago, and she reports that she has had history of colon polyps in the past. GI will continue to monitor.  She is widowed, has both sons and daughters. She moved here from [place] to live in [place] near her daughter.  She also mentions that she has an appt with Dr. Rockey Situ (cardiologist) tomorrow.  We have her GI records from [place] but not her primary care.  She is a widowed Mother  of 2, 1 daughter 1 son, 5 granddaughters and 11 great-grandchildren.  Allergies  Allergen Reactions  . Shellfish Allergy     Ended up in the ED after eating shellfish, caused diarrhea and severe GI upset , unsure if caused SOB     Current Outpatient Medications:  .  celecoxib (CELEBREX) 200 MG capsule, Take 200 mg by mouth daily., Disp: , Rfl:  .  clopidogrel (PLAVIX) 75 MG tablet, Take 75 mg by mouth daily., Disp: , Rfl:  .  DULoxetine (CYMBALTA) 60 MG capsule, Take 60 mg by mouth daily., Disp: , Rfl:  .  esomeprazole (NEXIUM) 20 MG capsule, Take 20 mg by mouth daily at 12 noon., Disp: , Rfl:  .  isosorbide mononitrate (IMDUR) 30 MG 24 hr tablet, Take 30 mg by mouth daily., Disp: , Rfl:  .  losartan (COZAAR) 100 MG tablet, Take 100 mg by mouth daily., Disp: , Rfl:  .  umeclidinium-vilanterol (ANORO ELLIPTA) 62.5-25 MCG/INH AEPB, Inhale 1 puff into the lungs daily., Disp: , Rfl:   Review of Systems  Constitutional: Negative.   HENT: Negative.   Eyes: Negative.   Respiratory: Positive for cough and shortness of breath.        Has COPD. No change from baseline.   Cardiovascular: Negative.   Gastrointestinal: Negative.   Endocrine: Negative.   Genitourinary: Negative.   Musculoskeletal: Positive for arthralgias, back pain, gait problem and joint swelling.  Allergic/Immunologic: Negative.   Hematological: Negative.   Psychiatric/Behavioral: Negative.     Social History   Tobacco Use  . Smoking status: Never Smoker  . Smokeless tobacco: Never Used  Substance Use Topics  . Alcohol use: Never    Frequency: Never      Objective:   BP 128/64 (BP Location: Right Arm, Patient Position: Sitting, Cuff Size: Normal)   Temp 98.4 F (36.9 C)   Resp 16   Wt 177 lb (80.3 kg)   BMI 29.45 kg/m  Vitals:   02/12/18 1457  BP: 128/64  Resp: 16  Temp: 98.4 F (36.9 C)  Weight: 177 lb (80.3 kg)     Physical Exam HENT:     Head: Normocephalic and atraumatic.     Right Ear: External ear normal.     Left Ear: External ear normal.  Eyes:     General: No scleral icterus.    Conjunctiva/sclera: Conjunctivae normal.  Cardiovascular:     Rate and Rhythm: Normal rate and regular rhythm.  Pulses: Normal pulses.     Heart sounds: Normal heart sounds.  Pulmonary:     Effort: Pulmonary effort is normal.     Breath sounds: Normal breath sounds.  Abdominal:     Palpations: Abdomen is soft.  Skin:    General: Skin is warm and dry.  Neurological:     General: No focal deficit present.     Mental Status: She is alert and oriented to person, place, and time. Mental status is at baseline.  Psychiatric:        Mood and Affect: Mood normal.        Behavior: Behavior normal.        Thought Content: Thought content normal.        Judgment: Judgment normal.         Assessment & Plan    1. Gastroesophageal reflux disease, esophagitis presence not specified On PPI.  2. Hypertension, unspecified type   3. Chronic obstructive pulmonary disease, unspecified COPD type ([place]) Stable today. - Ambulatory  referral to Pulmonology  4. Arthralgia, unspecified joint Come back on Celebrex with the plan of discontinuing in the near future. - celecoxib (CELEBREX) 100 MG capsule; Take 1 capsule (100 mg total) by mouth daily.  Dispense: 30 capsule; Refill: 11  5. Heterozygous alpha 1-antitrypsin deficiency ([place])   6. ASCVD (arteriosclerotic cardiovascular disease) All risk factors treated.  7. Atherosclerosis of native coronary artery of native heart with stable angina pectoris ([place])   8. Essential hypertension, benign   9. Iron deficiency anemia, unspecified iron deficiency anemia type   10. Carrier of alpha-1-antitrypsin deficiency     I have done the exam and reviewed the above chart and it is accurate to the best of my knowledge. Development worker, community has been used in this note in any air is in the dictation or transcription are unintentional.  [name], MD  [facility]

## 2018-02-12 NOTE — Progress Notes (Signed)
Cardiology Office Note  Date:  02/13/2018   ID:  Terri Braun, DOB [birth year], MRN [id number]  PCP:  Jerrol Banana., MD   Chief Complaint  Patient presents with  . Other    Self referral. Patient just moved here and needs to establish care. Meds reviewed verbally with patient.     HPI:  [name] is a 85 year old woman with past medical history of GERD COPD coronary artery disease  right coronary artery stent 2012 with repeat catheterization 2015 confirming patent stent Chronic SOB Gait instability, uses a cane moved from [facility] Who presents to establish for her coronary artery disease  Note indicating history of : status post Nissen fundoplication,  liver disease  hepatitis ,  MZ alpha-1 antitrypsin phenotype,  sessile serrated polyp of the cecum that has been biopsied in 2013 in 2017 but not removed.   She reports having  severe muscle spasm in her left leg that has not totally improved and is unable to climb steps.  Patient also adds having chronic back pain with sciatica type pain down the left leg sometimes down to her foot  Difficulty walking, walks with a cane Reports having a fall 2 weeks ago  Needs refills on her medications Review of the extensive notes provided from [facility] cardiology [facility] Cholesterol typically runs 250 and she is not on a cholesterol medication. Patient and daughter today unaware why she is not on a statin Last LDL 117 in March 2018  EKG personally reviewed by myself on todays visit Shows normal sinus rhythm rate 71 bpm no significant ST or T wave changes   Echocardiogram reviewed from February 2017 showing normal LV function mild AI and MR  Cardiac catheterization January 06, 2014 showing patent proximal RCA stent 50% ostial narrowing followed by 30% in-stent restenosis, smaller PDA 40% disease 20 to 30% ostial left circumflex 20% up to 30% LAD disease   PMH:   has a past  medical history of Anemia, ASCVD (arteriosclerotic cardiovascular disease), COPD (chronic obstructive pulmonary disease) ([place]), Diverticulosis, GERD (gastroesophageal reflux disease), Hypertension, and Hypothyroidism.  PSH:    Past Surgical History:  Procedure Laterality Date  . APPENDECTOMY    . CHOLECYSTECTOMY    . COLONOSCOPY  11/04/2015  . CORONARY ANGIOPLASTY WITH STENT PLACEMENT    . NISSEN FUNDOPLICATION    . TOTAL ABDOMINAL HYSTERECTOMY      Current Outpatient Medications  Medication Sig Dispense Refill  . celecoxib (CELEBREX) 100 MG capsule Take 1 capsule (100 mg total) by mouth daily. 30 capsule 11  . clopidogrel (PLAVIX) 75 MG tablet Take 75 mg by mouth daily.    . DULoxetine (CYMBALTA) 60 MG capsule Take 60 mg by mouth daily.    Marland Kitchen esomeprazole (NEXIUM) 20 MG capsule Take 20 mg by mouth daily at 12 noon.    . isosorbide mononitrate (IMDUR) 30 MG 24 hr tablet Take 1 tablet (30 mg total) by mouth daily. 90 tablet 3  . losartan (COZAAR) 100 MG tablet Take 1 tablet (100 mg total) by mouth daily. 90 tablet 3  . umeclidinium-vilanterol (ANORO ELLIPTA) 62.5-25 MCG/INH AEPB Inhale 1 puff into the lungs daily.    . rosuvastatin (CRESTOR) 10 MG tablet Take 1 tablet (10 mg total) by mouth daily. 90 tablet 3   No current facility-administered medications for this visit.      Allergies:   Shellfish allergy   Social History:  The patient  reports that she has never smoked. She  has never used smokeless tobacco. She reports that she does not drink alcohol or use drugs.   Family History:   family history includes Coronary artery disease in her mother; Crohn's disease in her son; Heart disease in her mother.    Review of Systems: Review of Systems  Constitutional: Negative.   Respiratory: Negative.   Cardiovascular: Negative.   Gastrointestinal: Negative.   Musculoskeletal: Positive for back pain.       Left leg pain, sciatica  Neurological: Negative.   Psychiatric/Behavioral:  Negative.   All other systems reviewed and are negative.   PHYSICAL EXAM: VS:  BP 140/60 (BP Location: Left Arm, Patient Position: Sitting, Cuff Size: Normal)   Pulse 71   Ht 5\' 5"  (1.651 m)   Wt 178 lb (80.7 kg)   BMI 29.62 kg/m  , BMI Body mass index is 29.62 kg/m. GEN: Well nourished, well developed, in no acute distress , frail, difficulty getting up with her cane and moving HEENT: normal  Neck: no JVD, carotid bruits, or masses Cardiac: RRR; no murmurs, rubs, or gallops,no edema  Respiratory:  clear to auscultation bilaterally, normal work of breathing GI: soft, nontender, nondistended, + BS MS: no deformity or atrophy  Skin: warm and dry, no rash Neuro:  Strength and sensation are intact Psych: euthymic mood, full affect   Recent Labs: No results found for requested labs within last 8760 hours.    Lipid Panel No results found for: CHOL, HDL, LDLCALC, TRIG    Wt Readings from Last 3 Encounters:  02/13/18 178 lb (80.7 kg)  02/12/18 177 lb (80.3 kg)  01/16/18 179 lb (81.2 kg)       ASSESSMENT AND PLAN:  Atherosclerosis of native coronary artery of native heart with stable angina pectoris ([place]) - Plan: EKG 12-Lead Prior cardiac catheterization report reviewed from December 2015 She denies any anginal symptoms.  No further work-up ordered. Recommend she consider restarting cholesterol medication given prior total cholesterol 240  Centrilobular emphysema ([place]) Followed by primary care.  Ports she is going to establish with pulmonary Denies any significant shortness of breath She is minimally active, slowly with a cane  Gastroesophageal reflux disease, esophagitis presence not specified Recently seen by GI No plan for procedures at this time Known polyp  Anemia, unspecified type No recent lab work available Will defer to primary care  Gait instability Difficulty getting up from a sitting position, required assistance, holding onto the walls even with her  cane Would like to do better with a walker  Fall, sequela Reports having a fall 2 to 3 weeks ago, Again may benefit from PT and a walker Recommend she discuss her severe back and left leg pain with Dr. Sharlet Salina and pain control clinic  Mixed hyperlipidemia Recommend she start a statin Daughter are unaware of any allergies or intolerance to statins Used to be on pravastatin several years ago and it fell off her medication list Prescription sent in for Crestor 10 daily Goal LDL less than 70  Disposition:   F/U  12 months  Significant records reviewed with her on today's visit, several cardiac catheterizations echocardiogram stress test clinic notes Long discussion concerning her prior cardiac history, medications and chronic pain  Total encounter time more than 60 minutes  Greater than 50% was spent in counseling and coordination of care with the patient   Orders Placed This Encounter  Procedures  . EKG 12-Lead     Signed, Esmond Plants, M.D., Ph.D. 02/13/2018  [place]  [place], [place]

## 2018-02-13 ENCOUNTER — Encounter

## 2018-02-13 ENCOUNTER — Encounter: Payer: Self-pay | Admitting: Cardiovascular Disease

## 2018-02-13 ENCOUNTER — Ambulatory Visit (INDEPENDENT_AMBULATORY_CARE_PROVIDER_SITE_OTHER): Payer: Medicare Other | Admitting: Cardiovascular Disease

## 2018-02-13 VITALS — BP 140/60 | HR 71 | Ht 65.0 in | Wt 178.0 lb

## 2018-02-13 DIAGNOSIS — W19XXXA Unspecified fall, initial encounter: Secondary | ICD-10-CM | POA: Insufficient documentation

## 2018-02-13 DIAGNOSIS — E782 Mixed hyperlipidemia: Secondary | ICD-10-CM | POA: Insufficient documentation

## 2018-02-13 DIAGNOSIS — J432 Centrilobular emphysema: Secondary | ICD-10-CM

## 2018-02-13 DIAGNOSIS — W19XXXS Unspecified fall, sequela: Secondary | ICD-10-CM

## 2018-02-13 DIAGNOSIS — I25118 Atherosclerotic heart disease of native coronary artery with other forms of angina pectoris: Secondary | ICD-10-CM

## 2018-02-13 DIAGNOSIS — K219 Gastro-esophageal reflux disease without esophagitis: Secondary | ICD-10-CM

## 2018-02-13 DIAGNOSIS — R2681 Unsteadiness on feet: Secondary | ICD-10-CM

## 2018-02-13 DIAGNOSIS — D649 Anemia, unspecified: Secondary | ICD-10-CM | POA: Diagnosis not present

## 2018-02-13 MED ORDER — LOSARTAN POTASSIUM 100 MG PO TABS: 100.0000 mg | ORAL_TABLET | Freq: Every day | ORAL | 3 refills | Status: DC

## 2018-02-13 MED ORDER — ISOSORBIDE MONONITRATE ER 30 MG PO TB24: 30.0000 mg | ORAL_TABLET | Freq: Every day | ORAL | 3 refills | Status: DC

## 2018-02-13 MED ORDER — ROSUVASTATIN CALCIUM 10 MG PO TABS: 10.0000 mg | ORAL_TABLET | Freq: Every day | ORAL | 3 refills | Status: DC

## 2018-02-13 NOTE — Patient Instructions (Addendum)
Dr. Juanda Crumble [facility] Physical medicine & rehabilitation - pain medicine [place], [place], [place] [address] [phone]   Gillis Santa, MD Specialties and/or Subspecialties Pain Management  [phone]   Medication Instructions:  Please start crestor 10 mg once a day for cholesterol  If you need a refill on your cardiac medications before your next appointment, please call your pharmacy.    Lab work: No new labs needed   If you have labs (blood work) drawn today and your tests are completely normal, you will receive your results only by: Marland Kitchen MyChart Message (if you have MyChart) OR . A paper copy in the mail If you have any lab test that is abnormal or we need to change your treatment, we will call you to review the results.   Testing/Procedures: No new testing needed   Follow-Up: At [facility], you and your health needs are our priority.  As part of our continuing mission to provide you with exceptional heart care, we have created designated Provider Care Teams.  These Care Teams include your primary Cardiologist (physician) and Advanced Practice Providers (APPs -  Physician Assistants and Nurse Practitioners) who all work together to provide you with the care you need, when you need it.  . You will need a follow up appointment in 12 months .   Please call our office 2 months in advance to schedule this appointment.    . Providers on your designated Care Team:   . Murray Hodgkins, NP . Christell Faith, PA-C . Marrianne Mood, PA-C  Any Other Special Instructions Will Be Listed Below (If Applicable).  For educational health videos Log in to : [email or web address] Or : [email or web address], password : [email or web address]

## 2018-02-16 DIAGNOSIS — K5730 Diverticulosis of large intestine without perforation or abscess without bleeding: Secondary | ICD-10-CM

## 2018-02-16 DIAGNOSIS — I2510 Atherosclerotic heart disease of native coronary artery without angina pectoris: Secondary | ICD-10-CM

## 2018-02-16 DIAGNOSIS — Z148 Genetic carrier of other disease: Secondary | ICD-10-CM

## 2018-02-16 DIAGNOSIS — R932 Abnormal findings on diagnostic imaging of liver and biliary tract: Secondary | ICD-10-CM

## 2018-02-16 DIAGNOSIS — E039 Hypothyroidism, unspecified: Secondary | ICD-10-CM | POA: Insufficient documentation

## 2018-02-16 DIAGNOSIS — Z860100 Personal history of colon polyps, unspecified: Secondary | ICD-10-CM

## 2018-02-16 DIAGNOSIS — K769 Liver disease, unspecified: Secondary | ICD-10-CM | POA: Insufficient documentation

## 2018-02-16 DIAGNOSIS — K922 Gastrointestinal hemorrhage, unspecified: Secondary | ICD-10-CM | POA: Insufficient documentation

## 2018-02-16 DIAGNOSIS — I10 Essential (primary) hypertension: Secondary | ICD-10-CM

## 2018-02-16 DIAGNOSIS — D509 Iron deficiency anemia, unspecified: Secondary | ICD-10-CM

## 2018-02-16 DIAGNOSIS — R9389 Abnormal findings on diagnostic imaging of other specified body structures: Secondary | ICD-10-CM

## 2018-02-16 DIAGNOSIS — R1033 Periumbilical pain: Secondary | ICD-10-CM

## 2018-02-16 DIAGNOSIS — Z8619 Personal history of other infectious and parasitic diseases: Secondary | ICD-10-CM

## 2018-02-16 DIAGNOSIS — K2970 Gastritis, unspecified, without bleeding: Secondary | ICD-10-CM

## 2018-02-16 DIAGNOSIS — K635 Polyp of colon: Secondary | ICD-10-CM

## 2018-02-16 DIAGNOSIS — B9681 Helicobacter pylori [H. pylori] as the cause of diseases classified elsewhere: Secondary | ICD-10-CM

## 2018-02-16 DIAGNOSIS — D122 Benign neoplasm of ascending colon: Principal | ICD-10-CM

## 2018-02-16 DIAGNOSIS — R748 Abnormal levels of other serum enzymes: Secondary | ICD-10-CM

## 2018-02-26 ENCOUNTER — Ambulatory Visit (INDEPENDENT_AMBULATORY_CARE_PROVIDER_SITE_OTHER): Payer: Medicare Other | Admitting: Pulmonary Disease

## 2018-02-26 ENCOUNTER — Encounter: Payer: Self-pay | Admitting: Pulmonary Disease

## 2018-02-26 VITALS — BP 134/80 | HR 83 | Ht 65.0 in

## 2018-02-26 DIAGNOSIS — E8801 Alpha-1-antitrypsin deficiency: Secondary | ICD-10-CM

## 2018-02-26 DIAGNOSIS — R0609 Other forms of dyspnea: Secondary | ICD-10-CM

## 2018-02-26 DIAGNOSIS — Z8709 Personal history of other diseases of the respiratory system: Secondary | ICD-10-CM | POA: Diagnosis not present

## 2018-02-26 DIAGNOSIS — J449 Chronic obstructive pulmonary disease, unspecified: Secondary | ICD-10-CM

## 2018-02-26 DIAGNOSIS — R29898 Other symptoms and signs involving the musculoskeletal system: Secondary | ICD-10-CM

## 2018-02-26 DIAGNOSIS — I2510 Atherosclerotic heart disease of native coronary artery without angina pectoris: Secondary | ICD-10-CM

## 2018-02-26 MED ORDER — FLUTICASONE-UMECLIDIN-VILANT 100-62.5-25 MCG/INH IN AEPB: 1.0000 | INHALATION_SPRAY | Freq: Every day | RESPIRATORY_TRACT | 5 refills | Status: DC

## 2018-02-26 MED ORDER — FLUTICASONE-UMECLIDIN-VILANT 100-62.5-25 MCG/INH IN AEPB: 1.0000 | INHALATION_SPRAY | Freq: Every day | RESPIRATORY_TRACT | 0 refills | Status: DC

## 2018-02-26 NOTE — Patient Instructions (Addendum)
Trial of Trelegy inhaler.  Sample provided and prescription entered.  Use once daily.  Rinse mouth after use. Overnight oximetry measurement ordered Lung function tests (PFTs) ordered to be performed prior to follow-up visit Follow-up in 3-4 weeks with chest x-ray prior to that visit

## 2018-02-26 NOTE — Progress Notes (Signed)
PULMONARY CONSULT NOTE  Requesting MD/Service: Self Date of initial consultation: 01/27 Reason for consultation: H/O alpha-1 antitrypsin deficiency and COPD, previously followed in [place].   PT PROFILE: 85 y.o. female never smoker with COPD initially diagnosed approximately 2005 and more recently alpha-1 antitrypsin deficiency diagnosed a couple of years prior to this initial evaluation has recently moved to this area and wishes to establish pulmonary care.  She has never been on enzyme replacement therapy.  DATA:  INTERVAL:  HPI:  As above.  Her diagnosis approximately 15 years ago was made on the basis of progressive exertional dyspnea.  She has been treated with various inhaler medicines with only modest benefit.  More recently, she was diagnosed with alpha-1 antitrypsin deficiency by a gastroenterologist based on her history of COPD and elevated liver function tests.  Most recently, she was on an oral inhaler which she thought was modestly beneficial.  She has been out of this medication for several months.  She does not have a rescue inhaler. At the present time, she has severe exertional intolerance.  However, she has chronic lower extremity weakness and left lower extremity pain due to sciatica.  She has difficulty discerning whether lower extremity pain or dyspnea is more limiting to her exercise tolerance.  She has occasional cough with scant mucus.  This can be triggered by exposures to strong odors or smells.  With exertion, she does develop lightheadedness and diaphoresis which resolves with rest.  She denies chest pain.  As an adult she has been diagnosed frequently with "bronchitis".  As stated above she has never smoked.  She has no significant occupational or environmental exposures.  She was never in the [employer].  She has no history of tuberculosis exposures.  Review of systems is positive for occasional orthopnea and intermittent hoarseness.  She denies paroxysmal nocturnal dyspnea.   She has occasional intermittent ankle edema.  She also notes unexplained mild weight gain over the past year.  Past Medical History:  Diagnosis Date  . Anemia   . ASCVD (arteriosclerotic cardiovascular disease)   . COPD (chronic obstructive pulmonary disease) ([place])   . Diverticulosis   . GERD (gastroesophageal reflux disease)   . Hypertension   . Hypothyroidism     Past Surgical History:  Procedure Laterality Date  . APPENDECTOMY    . BREAST SURGERY  1983   Lumpectomy  . CHOLECYSTECTOMY    . COLON SURGERY    . COLONOSCOPY  11/04/2015  . CORONARY ANGIOPLASTY WITH STENT PLACEMENT    . NISSEN FUNDOPLICATION    . TOTAL ABDOMINAL HYSTERECTOMY     With BSO    MEDICATIONS: I have reviewed all medications and confirmed regimen as documented  Social History   Socioeconomic History  . Marital status: Widowed    Spouse name: Not on file  . Number of children: Not on file  . Years of education: Not on file  . Highest education level: Not on file  Occupational History  . Not on file  Social Needs  . Financial resource strain: Not on file  . Food insecurity:    Worry: Not on file    Inability: Not on file  . Transportation needs:    Medical: Not on file    Non-medical: Not on file  Tobacco Use  . Smoking status: Never Smoker  . Smokeless tobacco: Never Used  Substance and Sexual Activity  . Alcohol use: Never    Frequency: Never  . Drug use: Never  . Sexual activity: Not  on file  Lifestyle  . Physical activity:    Days per week: Not on file    Minutes per session: Not on file  . Stress: Not on file  Relationships  . Social connections:    Talks on phone: Not on file    Gets together: Not on file    Attends religious service: Not on file    Active member of club or organization: Not on file    Attends meetings of clubs or organizations: Not on file    Relationship status: Not on file  . Intimate partner violence:    Fear of current or ex partner: Not on file     Emotionally abused: Not on file    Physically abused: Not on file    Forced sexual activity: Not on file  Other Topics Concern  . Not on file  Social History Narrative   She is widowed she has sons and daughters, she moved here from [place] to live in [place] near her daughter   2 caffeinated beverages a day   Rare alcohol   Never smoker    Family History  Problem Relation Age of Onset  . Coronary artery disease Mother   . Heart disease Mother   . Crohn's disease Son   . Cancer Other        other family member with breast cancer    ROS: No fever, myalgias/arthralgias, unexplained weight loss No new focal weakness or sensory deficits No otalgia, hearing loss, visual changes, nasal and sinus symptoms, mouth and throat problems No neck pain or adenopathy No abdominal pain, N/V/D, diarrhea, change in bowel pattern No dysuria, change in urinary pattern   Vitals:   02/26/18 0833 02/26/18 0845  BP:  134/80  Pulse:  83  SpO2:  96%  Height: 5\' 5"  (1.651 m)      EXAM:  Gen: WDWN, No overt respiratory distress HEENT: NCAT, sclera white, oropharynx normal Neck: Supple without LAN, thyromegaly, JVD Lungs: breath sounds full without wheezes or other adventitious sounds.  Percussion normal throughout. Cardiovascular: RRR, no murmurs noted Abdomen: Soft, nontender, normal BS Ext: without clubbing, cyanosis.  Trace, symmetric ankle edema. Neuro: CNs grossly intact, motor and sensory intact Skin: Limited exam, no lesions noted  DATA:   BMP Latest Ref Rng & Units 08/23/2016  Creatinine 0.5 - 1.1 1.1  Sodium 137 - 147 141  Potassium 3.4 - 5.3 4.8    CBC Latest Ref Rng & Units 08/23/2016  WBC - 5.0  Hemoglobin 12.0 - 16.0 11.3(A)  Hematocrit 36 - 46 36  Platelets 150 - 399 225    CXR:  None available  I have personally reviewed all chest radiographs reported above including CXRs and CT chest unless otherwise indicated  IMPRESSION:     ICD-10-CM    1. History of COPD Z87.09 Pulse oximetry, overnight    Pulmonary Function Test [facility] Only    DG Chest 2 View  2. Chronic asthmatic bronchitis ([facility]) J44.9 Pulse oximetry, overnight    Pulmonary Function Test [facility] Only    DG Chest 2 View  3. Alpha-1-antitrypsin deficiency ([place]) E88.01 Pulse oximetry, overnight    Pulmonary Function Test [facility] Only    DG Chest 2 View  4. DOE (dyspnea on exertion) R06.09 Pulse oximetry, overnight    Pulmonary Function Test [facility] Only    DG Chest 2 View  5. Severe muscle deconditioning R29.898 Pulse oximetry, overnight    Pulmonary Function Test [facility] Only  DG Chest 2 View   I suspect much of her exercise intolerance is due to deconditioning and musculoskeletal limitations related to sciatica and lower extremity weakness.  PLAN:  Trial of Trelegy inhaler.  Sample provided and prescription entered.  She is instructed to use once daily and rinse mouth after use  Overnight oximetry ordered  Follow-up in 3-4 weeks with PFTs and CXR prior to that visit  In the interim, we will try to obtain records from her prior pulmonologist in [facility].   Merton Border, MD PCCM service Mobile [phone] Pager [phone] 02/26/2018 10:53 AM

## 2018-03-07 ENCOUNTER — Telehealth: Payer: Self-pay | Admitting: *Deleted

## 2018-03-07 NOTE — Telephone Encounter (Signed)
ONO normal no need for 02 at this time. Called pts daughter Hoyle Sauer and left message to call back.

## 2018-03-08 NOTE — Telephone Encounter (Signed)
Spoke with patients daughter gave results of ONO. Nothing further needed.

## 2018-03-10 ENCOUNTER — Encounter: Payer: Self-pay | Admitting: Intensive Care

## 2018-03-10 ENCOUNTER — Emergency Department
Admission: EM | Admit: 2018-03-10 | Discharge: 2018-03-10 | Disposition: A | Discharge status: Home / Self Care | Payer: Medicare Other | Attending: Emergency Medicine | Admitting: Emergency Medicine

## 2018-03-10 ENCOUNTER — Emergency Department: Payer: Medicare Other

## 2018-03-10 ENCOUNTER — Other Ambulatory Visit: Payer: Self-pay

## 2018-03-10 DIAGNOSIS — G8929 Other chronic pain: Secondary | ICD-10-CM

## 2018-03-10 DIAGNOSIS — I10 Essential (primary) hypertension: Secondary | ICD-10-CM | POA: Insufficient documentation

## 2018-03-10 DIAGNOSIS — Z7902 Long term (current) use of antithrombotics/antiplatelets: Secondary | ICD-10-CM | POA: Diagnosis not present

## 2018-03-10 DIAGNOSIS — Z79899 Other long term (current) drug therapy: Secondary | ICD-10-CM | POA: Insufficient documentation

## 2018-03-10 DIAGNOSIS — E039 Hypothyroidism, unspecified: Secondary | ICD-10-CM | POA: Insufficient documentation

## 2018-03-10 DIAGNOSIS — M5442 Lumbago with sciatica, left side: Secondary | ICD-10-CM | POA: Insufficient documentation

## 2018-03-10 DIAGNOSIS — J449 Chronic obstructive pulmonary disease, unspecified: Secondary | ICD-10-CM | POA: Insufficient documentation

## 2018-03-10 DIAGNOSIS — M79605 Pain in left leg: Secondary | ICD-10-CM | POA: Diagnosis present

## 2018-03-10 LAB — URINALYSIS, COMPLETE (UACMP) WITH MICROSCOPIC
Bilirubin Urine: NEGATIVE
Glucose, UA: NEGATIVE mg/dL
Hgb urine dipstick: NEGATIVE
Ketones, ur: NEGATIVE mg/dL
NITRITE: NEGATIVE
Protein, ur: NEGATIVE mg/dL
Specific Gravity, Urine: 1.029 (ref 1.005–1.030)
pH: 5 (ref 5.0–8.0)

## 2018-03-10 MED ORDER — TRAMADOL HCL 50 MG PO TABS
50.0000 mg | ORAL_TABLET | Freq: Once | ORAL | Status: AC
  Administered 2018-03-10: 50 mg via ORAL
  Filled 2018-03-10: qty 1

## 2018-03-10 MED ORDER — TRAMADOL HCL 50 MG PO TABS: 50.0000 mg | ORAL_TABLET | Freq: Three times a day (TID) | ORAL | 0 refills | Status: AC | PRN

## 2018-03-10 NOTE — ED Notes (Signed)
Pt with c/o of left calve pain that has increased over the last 3 months. Pt ambulatory to flex room.Pt calling out from room with c/o of extreme pain. Pt sitting in chair with legs crossed and NAD at this time.

## 2018-03-10 NOTE — ED Triage Notes (Addendum)
Patient reports about 3 months ago her L calf/leg started hurting. Denies injury. Pain worse with ambulating. Able to ambulate with cain with pain. HX fibromyalgia and arthritis

## 2018-03-10 NOTE — Discharge Instructions (Signed)
Your DVT study was negative. You have opted to have x-rays deferred until you see Dr. Rosanna Randy. Take the prescription pain medicine, as needed. Follow-up with Dr. Rosanna Randy for further evaluation of your back & leg pain.

## 2018-03-11 NOTE — ED Provider Notes (Signed)
[facility] Emergency Department Provider Note ____________________________________________  Time seen: 1405  I have reviewed the triage vital signs and the nursing notes.  HISTORY  Chief Complaint  Leg Pain (left calf)  HPI Terri Braun is a 85 y.o. female presents to the ED accompanied by her adult son, for evaluation of ongoing left leg pain.  Patient denies any injury, accident, or trauma.  She does give a history of degenerative disc disease in the lumbar spine, as well as some arthritis in the joints.  She apparently has had complaints of left leg pain for several months now.  She was most recently evaluated in [facility] a local emergency department there.  Patient reports a negative DVT study at that time.  According to the patient as well, her primary care provider or the ED provider, were suspecting that her leg pain was likely radicular in nature.  Patient describes pain to the left lower leg and calf that is worse with ambulating.  She uses a cane to ambulate secondary to the pain.  She does have a history of fibromyalgia as well, but denies any current medication management of her symptoms.  She denies any distal paresthesias, foot drop, bladder or bowel incontinence, or saddle anesthesias.  She scheduled to see her primary provider here on Monday, for further evaluation and management of her symptoms.  Past Medical History:  Diagnosis Date  . Anemia   . ASCVD (arteriosclerotic cardiovascular disease)   . COPD (chronic obstructive pulmonary disease) ([place])   . Diverticulosis   . GERD (gastroesophageal reflux disease)   . Hypertension   . Hypothyroidism     Patient Active Problem List   Diagnosis Date Noted  . Polyp of ascending colon 02/16/2018  . Diverticula, colon 02/16/2018  . Primary hypothyroidism 02/16/2018  . Essential hypertension, benign 02/16/2018  . ASCVD (arteriosclerotic cardiovascular disease) 02/16/2018  .  Periumbilical abdominal pain 02/16/2018  . GI bleed 02/16/2018  . Iron deficiency anemia, unspecified 02/16/2018  . Chronic liver disease 02/16/2018  . Carrier of alpha-1-antitrypsin deficiency 02/16/2018  . Personal history of colonic polyps 02/16/2018  . Abnormal liver CT 02/16/2018  . Elevated liver enzymes 02/16/2018  . History of hepatitis C 02/16/2018  . Abnormal findings on imaging test 02/16/2018  . Helicobacter pylori gastritis 02/16/2018  . Gait instability 02/13/2018  . Fall 02/13/2018  . Mixed hyperlipidemia 02/13/2018  . Atherosclerosis of native coronary artery of native heart with stable angina pectoris ([place]) 02/12/2018  . Centrilobular emphysema ([place]) 02/12/2018  . Anemia 02/12/2018  . Benign neoplasm of cecum 01/16/2018  . Heterozygous alpha 1-antitrypsin deficiency ([place]) 01/16/2018  . Gastroesophageal reflux disease 01/16/2018    Past Surgical History:  Procedure Laterality Date  . APPENDECTOMY    . BREAST SURGERY  1983   Lumpectomy  . CHOLECYSTECTOMY    . COLON SURGERY    . COLONOSCOPY  11/04/2015  . CORONARY ANGIOPLASTY WITH STENT PLACEMENT    . NISSEN FUNDOPLICATION    . TOTAL ABDOMINAL HYSTERECTOMY     With BSO    Prior to Admission medications   Medication Sig Start Date End Date Taking? Authorizing Provider  celecoxib (CELEBREX) 100 MG capsule Take 1 capsule (100 mg total) by mouth daily. 02/12/18   Jerrol Banana., MD  clopidogrel (PLAVIX) 75 MG tablet Take 75 mg by mouth daily.    [provider]  DULoxetine (CYMBALTA) 60 MG capsule Take 60 mg by mouth daily.    [provider]  esomeprazole (NEXIUM) 20 MG capsule Take 20 mg by mouth daily at 12 noon.    [provider]  Fluticasone-Umeclidin-Vilant (TRELEGY ELLIPTA) 100-62.5-25 MCG/INH AEPB Inhale 1 puff into the lungs daily. 02/26/18   Wilhelmina Mcardle, MD  Fluticasone-Umeclidin-Vilant (TRELEGY ELLIPTA) 100-62.5-25 MCG/INH AEPB Inhale 1 puff into the lungs daily.  02/26/18   Wilhelmina Mcardle, MD  isosorbide mononitrate (IMDUR) 30 MG 24 hr tablet Take 1 tablet (30 mg total) by mouth daily. 02/13/18   Minna Merritts, MD  losartan (COZAAR) 100 MG tablet Take 1 tablet (100 mg total) by mouth daily. 02/13/18   Minna Merritts, MD  rosuvastatin (CRESTOR) 10 MG tablet Take 1 tablet (10 mg total) by mouth daily. 02/13/18   Minna Merritts, MD  traMADol (ULTRAM) 50 MG tablet Take 1 tablet (50 mg total) by mouth 3 (three) times daily as needed for up to 3 days. 03/10/18 03/13/18  Nash Bolls, Dannielle Karvonen, PA-C    Allergies Shellfish allergy  Family History  Problem Relation Age of Onset  . Coronary artery disease Mother   . Heart disease Mother   . Crohn's disease Son   . Cancer Other        other family member with breast cancer    Social History Social History   Tobacco Use  . Smoking status: Never Smoker  . Smokeless tobacco: Never Used  Substance Use Topics  . Alcohol use: Never    Frequency: Never  . Drug use: Never    Review of Systems  Constitutional: Negative for fever. Eyes: Negative for visual changes. ENT: Negative for sore throat. Cardiovascular: Negative for chest pain. Respiratory: Negative for shortness of breath. Gastrointestinal: Negative for abdominal pain, vomiting and diarrhea. Genitourinary: Negative for dysuria. Musculoskeletal: Negative for back pain.  Left leg pain as above. Skin: Negative for rash. Neurological: Negative for headaches, focal weakness or numbness. ____________________________________________  PHYSICAL EXAM:  VITAL SIGNS: ED Triage Vitals [03/10/18 1109]  Enc Vitals Group     BP (!) 133/57     Pulse Rate 86     Resp 16     Temp 98.4 F (36.9 C)     Temp Source Oral     SpO2 98 %     Weight 175 lb (79.4 kg)     Height 5\' 5"  (1.651 m)     Head Circumference      Peak Flow      Pain Score 9     Pain Loc      Pain Edu?      Excl. in [place]?     Constitutional: Alert and oriented. Well  appearing and in no distress. Head: Normocephalic and atraumatic. Eyes: Conjunctivae are normal. Normal extraocular movements Cardiovascular: Normal rate, regular rhythm. Normal distal pulses.  No distal edema noted. Respiratory: Normal respiratory effort. No wheezes/rales/rhonchi. Gastrointestinal: Soft and nontender. No distention. Musculoskeletal: Left leg without obvious deformity, dislocation, or edema.  Patient's left knee is without joint effusion but does have chronic changes consistent with likely DJD.  Patient with some decreased flexion range at the knee on the left.  She is tender to palpation over the left calf but no palpable cords or muscle defects are appreciated.  Nontender with normal range of motion in all extremities.  Neurologic:  Normal gait without ataxia. Normal speech and language. No gross focal neurologic deficits are appreciated. Skin:  Skin is warm, dry and intact. No rash noted. ____________________________________________   LABORATORY  Labs Reviewed  URINALYSIS, COMPLETE (UACMP) WITH MICROSCOPIC - Abnormal; Notable for the following components:      Result Value   Color, Urine YELLOW (*)    APPearance CLOUDY (*)    Leukocytes, UA MODERATE (*)    Bacteria, UA RARE (*)    All other components within normal limits   ____________________________________________  RADIOLOGY  US Venous Img LLE  IMPRESSION: No evidence of deep venous thrombosis. ____________________________________________  PROCEDURES  Procedures Ultram 50 mg PO ____________________________________________  INITIAL IMPRESSION / ASSESSMENT AND PLAN / ED COURSE  Geriatric patient with ED evaluation of chronic left leg pain for the last 3 months.  She has been evaluated in the past with a recent DVT study which was negative.  Patient is scheduled to see her primary provider on Monday for ongoing symptoms.  Patient is advised to follow-up as scheduled.  A small prescription for Ultram was  provided for acute pain relief.  We discussed the option of doing a lumbar x-ray at this time, but the patient declined secondary to concern for long wait.  She will follow-up with Dr. Rosanna Randy for ongoing management of her symptoms.  Likely etiology includes lumbar radiculopathy versus underlying DDD/DJD.    I reviewed the patient's prescription history over the last 12 months in the multi-state controlled substances database(s) that includes [place], [place], [place], [place], [place], [place], [place], [place], [place], [place], [place], [place], [place], and [place].  Results were notable for no current prescriptions.  ____________________________________________  FINAL CLINICAL IMPRESSION(S) / ED DIAGNOSES  Final diagnoses:  Chronic left-sided low back pain with left-sided sciatica      Melvenia Needles, PA-C 03/11/18 1920    Earleen Newport, MD 03/12/18 1058

## 2018-03-12 ENCOUNTER — Ambulatory Visit
Admission: RE | Admit: 2018-03-12 | Discharge: 2018-03-12 | Disposition: A | Discharge status: Home / Self Care | Payer: Medicare Other | Source: Ambulatory Visit | Attending: Family Medicine | Admitting: Family Medicine

## 2018-03-12 ENCOUNTER — Ambulatory Visit (INDEPENDENT_AMBULATORY_CARE_PROVIDER_SITE_OTHER): Payer: Medicare Other | Admitting: Family Medicine

## 2018-03-12 ENCOUNTER — Encounter: Payer: Self-pay | Admitting: Family Medicine

## 2018-03-12 VITALS — BP 128/82 | HR 86 | Temp 98.6°F | Resp 16 | Ht 65.0 in | Wt 175.0 lb

## 2018-03-12 DIAGNOSIS — J432 Centrilobular emphysema: Secondary | ICD-10-CM

## 2018-03-12 DIAGNOSIS — M25562 Pain in left knee: Secondary | ICD-10-CM

## 2018-03-12 DIAGNOSIS — I2510 Atherosclerotic heart disease of native coronary artery without angina pectoris: Secondary | ICD-10-CM

## 2018-03-12 DIAGNOSIS — M5442 Lumbago with sciatica, left side: Secondary | ICD-10-CM

## 2018-03-12 DIAGNOSIS — R2681 Unsteadiness on feet: Secondary | ICD-10-CM

## 2018-03-12 DIAGNOSIS — I25118 Atherosclerotic heart disease of native coronary artery with other forms of angina pectoris: Secondary | ICD-10-CM

## 2018-03-12 DIAGNOSIS — E8801 Alpha-1-antitrypsin deficiency: Secondary | ICD-10-CM | POA: Diagnosis not present

## 2018-03-12 DIAGNOSIS — M545 Low back pain, unspecified: Secondary | ICD-10-CM

## 2018-03-12 DIAGNOSIS — E039 Hypothyroidism, unspecified: Secondary | ICD-10-CM

## 2018-03-12 MED ORDER — GABAPENTIN 100 MG PO CAPS: 100.0000 mg | ORAL_CAPSULE | Freq: Three times a day (TID) | ORAL | 3 refills | Status: DC

## 2018-03-12 NOTE — Patient Instructions (Signed)
Start Gabapentin 100mg  by mouth at bedtime for week 1, then 100mg  twice a day at week 2, then 100mg  three times a day starting at week 3.

## 2018-03-12 NOTE — Progress Notes (Signed)
Patient: Terri Braun Female    DOB: 1929/04/09   85 y.o.   MRN: [id number] Visit Date: 03/12/2018  Today's Provider: [name], MD   Chief Complaint  Patient presents with  . ER follow up   Subjective:   HPI   Follow Up ER Visit  Patient is here for ER follow up.  She was recently seen at [facility] for left leg pain on 03/10/2018. Treatment for this included starting Tramadol 50mg  TID PRN for 3 days only.  She reports good compliance with treatment. She reports this condition is Unchanged. She is still having severe pain in her left leg. She reports that it is very painful from going from sitting to standing position. She can not bear weight on that leg, and it makes it difficult to walk. She does use a can to help her ambulate. Denies any falls since her visit in the ER.    Allergies  Allergen Reactions  . Shellfish Allergy     Ended up in the ED after eating shellfish, caused diarrhea and severe GI upset , unsure if caused SOB     Current Outpatient Medications:  .  celecoxib (CELEBREX) 100 MG capsule, Take 1 capsule (100 mg total) by mouth daily., Disp: 30 capsule, Rfl: 11 .  clopidogrel (PLAVIX) 75 MG tablet, Take 75 mg by mouth daily., Disp: , Rfl:  .  DULoxetine (CYMBALTA) 60 MG capsule, Take 60 mg by mouth daily., Disp: , Rfl:  .  esomeprazole (NEXIUM) 20 MG capsule, Take 20 mg by mouth daily at 12 noon., Disp: , Rfl:  .  isosorbide mononitrate (IMDUR) 30 MG 24 hr tablet, Take 1 tablet (30 mg total) by mouth daily., Disp: 90 tablet, Rfl: 3 .  losartan (COZAAR) 100 MG tablet, Take 1 tablet (100 mg total) by mouth daily., Disp: 90 tablet, Rfl: 3 .  rosuvastatin (CRESTOR) 10 MG tablet, Take 1 tablet (10 mg total) by mouth daily., Disp: 90 tablet, Rfl: 3 .  traMADol (ULTRAM) 50 MG tablet, Take 1 tablet (50 mg total) by mouth 3 (three) times daily as needed for up to 3 days., Disp: 9 tablet, Rfl: 0 .  Fluticasone-Umeclidin-Vilant (TRELEGY ELLIPTA) 100-62.5-25  MCG/INH AEPB, Inhale 1 puff into the lungs daily. (Patient not taking: Reported on 03/12/2018), Disp: 60 each, Rfl: 5 .  Fluticasone-Umeclidin-Vilant (TRELEGY ELLIPTA) 100-62.5-25 MCG/INH AEPB, Inhale 1 puff into the lungs daily., Disp: 1 each, Rfl: 0  Review of Systems  Constitutional: Positive for activity change and fatigue.  Eyes: Negative.   Respiratory: Negative for cough and shortness of breath.   Cardiovascular: Negative for chest pain, palpitations and leg swelling.  Gastrointestinal: Negative.   Endocrine: Negative.   Musculoskeletal: Positive for arthralgias, gait problem, joint swelling and myalgias.  Allergic/Immunologic: Negative.   Neurological: Negative for dizziness, tremors, syncope, weakness and light-headedness.  Psychiatric/Behavioral: Negative.     Social History   Tobacco Use  . Smoking status: Never Smoker  . Smokeless tobacco: Never Used  Substance Use Topics  . Alcohol use: Never    Frequency: Never      Objective:   BP 128/82 (BP Location: Left Arm, Patient Position: Sitting, Cuff Size: Normal)   Pulse 86   Temp 98.6 F (37 C)   Resp 16   Ht 5\' 5"  (1.651 m)   Wt 175 lb (79.4 kg)   SpO2 96%   BMI 29.12 kg/m  Vitals:   03/12/18 1329  BP: 128/82  Pulse: 86  Resp: 16  Temp: 98.6 F (37 C)  SpO2: 96%  Weight: 175 lb (79.4 kg)  Height: 5\' 5"  (1.651 m)     Physical Exam HENT:     Head: Normocephalic and atraumatic.     Right Ear: External ear normal.     Left Ear: External ear normal.  Eyes:     General: No scleral icterus.    Conjunctiva/sclera: Conjunctivae normal.  Cardiovascular:     Rate and Rhythm: Normal rate and regular rhythm.     Pulses: Normal pulses.     Heart sounds: Normal heart sounds.  Pulmonary:     Effort: Pulmonary effort is normal.     Breath sounds: Normal breath sounds.  Abdominal:     Palpations: Abdomen is soft.  Musculoskeletal:        General: No tenderness.     Comments: Tender along the medial and  lateral joint line of the left knee.  Minimal tenderness along both SI joints of the LS spine.  Skin:    General: Skin is warm and dry.  Neurological:     General: No focal deficit present.     Mental Status: She is alert and oriented to person, place, and time. Mental status is at baseline.     Comments: Mild decrease strength of the left leg, specifically some decreased dorsiflexion of the left foot.  Psychiatric:        Mood and Affect: Mood normal.        Behavior: Behavior normal.        Thought Content: Thought content normal.        Judgment: Judgment normal.         Assessment & Plan    1. Left-sided low back pain with left-sided sciatica, unspecified chronicity Start with x-ray today.  Think certainly an MRI might be appropriate later on but at 4 I am not excited about the option of surgery for her.  We will try to get some pain control with gabapentin  titrating from 100 mg daily to 100 mg 3 times a day - DG Lumbar Spine Complete; Future - Ambulatory referral to Orthopedic Surgery  2. Left knee pain, unspecified chronicity Refer to orthopedist as I think this is definitely arthritis of this knee - DG Knee Complete 4 Views Left; Future - gabapentin (NEURONTIN) 100 MG capsule; Take 1 capsule (100 mg total) by mouth 3 (three) times daily.  Dispense: 90 capsule; Refill: 3 - Ambulatory referral to Orthopedic Surgery  3. Atherosclerosis of native coronary artery of native heart with stable angina pectoris ([place]) All risk factors treated  4. Heterozygous alpha 1-antitrypsin deficiency ([place])   5. Centrilobular emphysema ([place])   6. Primary hypothyroidism   7. Gait instability Multi factorial per.  Certainly the arthritis and back problems are major contributors.     Farrell Pantaleo Cranford Mon, MD  [facility]

## 2018-03-16 ENCOUNTER — Telehealth: Payer: Self-pay

## 2018-03-16 NOTE — Telephone Encounter (Signed)
Please advise 

## 2018-03-16 NOTE — Telephone Encounter (Signed)
Patient's daughter Hoyle Sauer advised and verbally voiced understanding.

## 2018-03-16 NOTE — Telephone Encounter (Signed)
It does not need to be weaned, she can just stop and see how she does over the weekend. Let Dr. Rosanna Randy know how she is doing next week.

## 2018-03-16 NOTE — Telephone Encounter (Signed)
Braun was just prescribed gabapentin to help with leg pain, and daughter feels that she is having side effects (excessive crying, drowsiness, dizzy spells) to the medication. She is wanting to know is it ok for her to stop the medication "cold [place]" or does she need to wean off of the medication? Please advise. Thanks! (Terri Braun). Call Braun daughter Hoyle Sauer [phone].

## 2018-03-20 ENCOUNTER — Ambulatory Visit
Admission: RE | Admit: 2018-03-20 | Discharge: 2018-03-20 | Disposition: A | Discharge status: Home / Self Care | Payer: Medicare Other | Source: Ambulatory Visit | Attending: Pulmonary Disease | Admitting: Pulmonary Disease

## 2018-03-20 ENCOUNTER — Ambulatory Visit (HOSPITAL_COMMUNITY): Payer: Medicare Other

## 2018-03-20 DIAGNOSIS — E8801 Alpha-1-antitrypsin deficiency: Secondary | ICD-10-CM | POA: Diagnosis present

## 2018-03-20 DIAGNOSIS — R0609 Other forms of dyspnea: Secondary | ICD-10-CM

## 2018-03-20 DIAGNOSIS — R29898 Other symptoms and signs involving the musculoskeletal system: Secondary | ICD-10-CM

## 2018-03-20 DIAGNOSIS — Z8709 Personal history of other diseases of the respiratory system: Secondary | ICD-10-CM | POA: Diagnosis not present

## 2018-03-20 DIAGNOSIS — J449 Chronic obstructive pulmonary disease, unspecified: Secondary | ICD-10-CM

## 2018-03-20 MED ORDER — ALBUTEROL SULFATE (2.5 MG/3ML) 0.083% IN NEBU
2.5000 mg | INHALATION_SOLUTION | Freq: Once | RESPIRATORY_TRACT | Status: AC
  Administered 2018-03-20: 2.5 mg via RESPIRATORY_TRACT
  Filled 2018-03-20: qty 3

## 2018-03-28 ENCOUNTER — Ambulatory Visit (INDEPENDENT_AMBULATORY_CARE_PROVIDER_SITE_OTHER): Payer: Medicare Other | Admitting: Pulmonary Disease

## 2018-03-28 ENCOUNTER — Encounter: Payer: Self-pay | Admitting: Pulmonary Disease

## 2018-03-28 VITALS — BP 128/62 | HR 73 | Resp 16 | Ht 65.0 in | Wt 174.0 lb

## 2018-03-28 DIAGNOSIS — E8801 Alpha-1-antitrypsin deficiency: Secondary | ICD-10-CM | POA: Diagnosis not present

## 2018-03-28 DIAGNOSIS — R0609 Other forms of dyspnea: Secondary | ICD-10-CM

## 2018-03-28 DIAGNOSIS — I2510 Atherosclerotic heart disease of native coronary artery without angina pectoris: Secondary | ICD-10-CM | POA: Diagnosis not present

## 2018-03-28 MED ORDER — UMECLIDINIUM-VILANTEROL 62.5-25 MCG/INH IN AEPB: 1.0000 | INHALATION_SPRAY | Freq: Every day | RESPIRATORY_TRACT | 5 refills | Status: DC

## 2018-03-28 MED ORDER — UMECLIDINIUM-VILANTEROL 62.5-25 MCG/INH IN AEPB: 1.0000 | INHALATION_SPRAY | Freq: Every day | RESPIRATORY_TRACT | 0 refills | Status: DC

## 2018-03-28 NOTE — Progress Notes (Signed)
PULMONARY CONSULT NOTE  Requesting MD/Service: Self Date of initial consultation: 02/26/18 Reason for consultation: H/O alpha-1 antitrypsin deficiency and COPD, previously followed in [place].   PT PROFILE: 85 y.o. female never smoker with COPD initially diagnosed approximately 2005 and more recently alpha-1 antitrypsin deficiency diagnosed a couple of years prior to this initial evaluation has recently moved to this area and wishes to establish pulmonary care.  She has never been on enzyme replacement therapy.  DATA: 03/20/18 PFTs: FVC: 2.47 > 2.59 L (100 > 104 %pred), FEV1: 1.94 > 1.96 L (104 > 106 %pred), FEV1/FVC: 79, DLCO 49 %pred, DLCO/VA 64% predicted  INTERVAL: No major events  SUBJ:  This is a follow-up visit after initial evaluation on 1/27.  Last visit, we initiated a trial of Trelegy inhaler.  She felt that this was at most modestly beneficial.  She still has moderate to severe exertional dyspnea with little day-to-day or moment to moment variation.  She is unable to afford the Trelegy inhaler.  She notes that she was previously on Anoro and believes that she was able to afford that co-pay.  She has no new complaints.  She denies CP, fever, purulent sputum, hemoptysis and calf tenderness.  There is no change in mild chronic symmetric ankle edema.   Vitals:   03/28/18 0832 03/28/18 0840  BP:  128/62  Pulse:  73  Resp: 16   SpO2:  99%  Weight: 174 lb (78.9 kg)   Height: 5\' 5"  (1.651 m)   Room air   EXAM:  Gen: NAD HEENT: NCAT, sclera white Neck: No JVD Lungs: breath sounds full, no wheezes or other adventitious sounds Cardiovascular: RRR, no murmurs Abdomen: Soft, nontender, normal BS Ext: without clubbing, cyanosis.  Trace symmetric ankle edema Neuro: grossly intact Skin: Limited exam, no lesions noted   DATA:   BMP Latest Ref Rng & Units 08/23/2016  Creatinine 0.5 - 1.1 1.1  Sodium 137 - 147 141  Potassium 3.4 - 5.3 4.8    CBC Latest Ref Rng & Units 08/23/2016   WBC - 5.0  Hemoglobin 12.0 - 16.0 11.3(A)  Hematocrit 36 - 46 36  Platelets 150 - 399 225    CXR 2/18: Lung volumes normal, no acute cardiac or pulmonary findings  I have personally reviewed all chest radiographs reported above including CXRs and CT chest unless otherwise indicated  IMPRESSION:     ICD-10-CM   1. DOE, likely due to deconditioning R06.09   2. Documented history of alpha-1-antitrypsin deficiency E88.01    Normal PFTs, normal CXR.  Validity of diagnosis questionable   She does have a reported history of alpha-1 antitrypsin deficiency.  However, there is no evidence of emphysema on chest x-ray and no evidence of obstructive lung disease on PFTs.  Chronic exertional dyspnea is not likely due to a primary pulmonary problem.  She remains more limited by musculoskeletal weakness and her likely cause of dyspnea is deconditioning.  PLAN:  Trial of Anoro inhaler.  2 samples have been provided.  She is to use it 1 week on, one-week off, 1 week on to discern whether it is of any benefit to her with regard to shortness of breath.  A prescription has been entered which she may fill if she believes Anoro is beneficial.  Follow-up in 3-4 months.  She is to call sooner if needed.   Merton Border, MD PCCM service Mobile [phone] Pager [phone] 03/28/2018 12:34 PM

## 2018-03-28 NOTE — Patient Instructions (Signed)
Trial of Anoro inhaler, 2 sample inhalers provided.  Use 1 inhalation daily for 1 week, then off for 1 week then use second inhaler for 1 week.  If you are unable to discern a difference in your breathing, there is no need to continue this medication.  If you feel that it has helped your shortness of breath, a prescription has been entered.  Follow-up in 3-4 months.  Call sooner if needed

## 2018-04-09 ENCOUNTER — Ambulatory Visit: Payer: Medicare Other | Admitting: Family Medicine

## 2018-05-02 ENCOUNTER — Other Ambulatory Visit: Payer: Self-pay

## 2018-05-02 MED ORDER — CLOPIDOGREL BISULFATE 75 MG PO TABS: 75.0000 mg | ORAL_TABLET | Freq: Every day | ORAL | 0 refills | Status: DC

## 2018-05-02 NOTE — Telephone Encounter (Signed)
*  STAT* If patient is at the pharmacy, call can be transferred to refill team.   1. Which medications need to be refilled? (please list name of each medication and dose if known) Plavix  2. Which pharmacy/location (including street and city if local pharmacy) is medication to be sent to?[facility] [address]  3. Do they need a 30 day or 90 day supply? [place]

## 2018-07-20 ENCOUNTER — Other Ambulatory Visit: Payer: Self-pay | Admitting: Family Medicine

## 2018-07-20 DIAGNOSIS — M25562 Pain in left knee: Secondary | ICD-10-CM

## 2018-07-24 ENCOUNTER — Other Ambulatory Visit: Payer: Self-pay | Admitting: Cardiovascular Disease

## 2018-08-22 ENCOUNTER — Telehealth: Payer: Self-pay

## 2018-08-22 NOTE — Telephone Encounter (Signed)
Pt states that losartan is on back order. She needs a medicine in the same class sent to [place] number : [phone]   Thanks,   -Mickel Baas

## 2018-08-23 MED ORDER — TELMISARTAN 80 MG PO TABS: 80.0000 mg | ORAL_TABLET | Freq: Every day | ORAL | 3 refills | Status: DC

## 2018-08-23 NOTE — Telephone Encounter (Signed)
Done

## 2018-08-23 NOTE — Telephone Encounter (Signed)
Telmisartan higher dose daily

## 2018-11-11 ENCOUNTER — Other Ambulatory Visit: Payer: Self-pay | Admitting: Family Medicine

## 2018-11-21 ENCOUNTER — Telehealth: Payer: Self-pay | Admitting: Family Medicine

## 2018-11-21 NOTE — Telephone Encounter (Signed)
Patient reports that she will take Telmisartan 80mg  since she knows that it works well.

## 2018-11-21 NOTE — Telephone Encounter (Signed)
Pt's daughter picked up her BP medications and there are 2.  Wanting to know which one she is supposed to take.  Please call pt back asap at [phone].  Thanks, [employer]

## 2018-11-21 NOTE — Telephone Encounter (Signed)
Patient was originally on Losartan, but this was on back order in 08/2018. The pharmacy requested that she take another medication due to it not being in stock.   She was prescribed Telmisartan 80mg  once daily and has tolerated well. On 11/11/2018, Telmisartan was discontinued (it was not noted why this was changed) and Irbesartan 300mg  was sent into the pharmacy.   Patient reports that she has both medications and wants to know which one you want her to take? Please advise. Thanks!

## 2018-11-21 NOTE — Telephone Encounter (Signed)
Either one is ok--I wonder if insurance covers the irbesartan? Therefore it was called in to replace other.

## 2018-11-22 ENCOUNTER — Other Ambulatory Visit: Payer: Self-pay | Admitting: Family Medicine

## 2018-11-22 ENCOUNTER — Other Ambulatory Visit: Payer: Self-pay | Admitting: Cardiovascular Disease

## 2018-11-23 NOTE — Telephone Encounter (Signed)
We advised the patient to discontinue Irbesartan and continue Telmisartan. However, Dr. Rockey Situ just refilled Irbesartan. Do you want cardiology to follow this? Please advise. Thanks!

## 2018-11-23 NOTE — Telephone Encounter (Signed)
Pt is waiting for this rx to be sent to the pharmacy.  She is also confused because Dr. Rockey Situ sent a medication that Dr. Rosanna Randy had told her not to take to the pharmacy yesterday.  Please call patient back and let her know which drug should she be taking.  CB#  [phone]  Con [place]

## 2018-11-26 NOTE — Telephone Encounter (Signed)
Please advise 

## 2018-11-26 NOTE — Telephone Encounter (Signed)
That will be fine. 

## 2018-12-24 ENCOUNTER — Telehealth: Payer: Self-pay

## 2018-12-24 NOTE — Telephone Encounter (Signed)
The patient is complaining of her Right Foot hurting. The pain started 12/21/2018 with redness on the top of the foot and swelling to the bottom part of her foot.The patient received a pedicure 2 weeks ago. The patient has been scheduled with Laverna Peace FNP-C tomorrow.

## 2018-12-24 NOTE — Progress Notes (Addendum)
Patient: Terri Braun Female    DOB: [birth year]   85 y.o.   MRN: [id number] Visit Date: 12/25/2018  Today's Provider: Marcille Buffy, FNP   Chief Complaint  Patient presents with  . Foot Pain   Subjective:     Foot Injury  There was no injury mechanism. The pain is present in the right foot. The quality of the pain is described as burning. The pain is moderate. The pain has been constant since onset. Associated symptoms include an inability to bear weight and muscle weakness. Pertinent negatives include no loss of motion, loss of sensation, numbness or tingling. She reports no foreign bodies present. The symptoms are aggravated by movement and weight bearing. She has tried non-weight bearing for the symptoms. The treatment provided no relief.   Started on Friday night 12/21/18 in right foot, she had had a pedicure a little less than a week before and reports " they worked on my feet doing deep massage for a long time in that area".  He also reports that the bottom of her foot where they massaged is tender as well but not as bad as the top of her foot.  She reports this is the only thing that she can think of that she is done differently.  Pain is localized to the area and reported as a 4 out of 10.  He has been taking Tylenol as needed with relief. She denies any other injury or trauma.  Denies any surgeries recently, no recent flights. No calf or leg pain.  He denies any respiratory symptoms, shortness of breath, or any open wounds.  Denies any history of DVT. History of cardiac stent in the past is followed by Dr. Rockey Situ cardiology. She is on Plavix 75 mg daily.   Patient  denies any fever, body aches,chills, rash, chest pain, shortness of breath, nausea, vomiting, or diarrhea.   Allergies  Allergen Reactions  . Shellfish Allergy Anaphylaxis    Ended up in the ED after eating shellfish, caused diarrhea and severe GI upset , unsure if caused SOB   There is  confusion on the daughters end as the pharmacy has been giving Losartan and Telmasartan. She also has an old bottle of irbesartan. She has been taking Losartan as below. She is not taking the other two.   Current Outpatient Medications:  .  celecoxib (CELEBREX) 100 MG capsule, Take 1 capsule (100 mg total) by mouth daily., Disp: 30 capsule, Rfl: 11 .  clopidogrel (PLAVIX) 75 MG tablet, TAKE 1 TABLET BY MOUTH EVERY DAY, Disp: 90 tablet, Rfl: 1 .  losartan (COZAAR) 100 MG tablet, Take 1 tablet (100 mg total) by mouth daily., Disp: 90 tablet, Rfl: 3 .  rosuvastatin (CRESTOR) 10 MG tablet, TAKE 1 TABLET BY MOUTH EVERY DAY, Disp: 90 tablet, Rfl: 0 . .  DULoxetine (CYMBALTA) 60 MG capsule, Take 60 mg by mouth daily., Disp: , Rfl:  .  esomeprazole (NEXIUM) 20 MG capsule, Take 20 mg by mouth daily at 12 noon., Disp: , Rfl:  .  gabapentin (NEURONTIN) 100 MG capsule, TAKE 1 CAPSULE BY MOUTH THREE TIMES A DAY (Patient not taking: Reported on 12/25/2018), Disp: 90 capsule, Rfl: 3 .  isosorbide mononitrate (IMDUR) 30 MG 24 hr tablet, Take 1 tablet (30 mg total) by mouth daily. (Patient not taking: Reported on 12/25/2018), Disp: 90 tablet, Rfl: 3 .  nitroGLYCERIN (NITROSTAT) 0.4 MG SL tablet, Place under the tongue., Disp: , Rfl:  .  traMADol (ULTRAM) 50 MG tablet, , Disp: , Rfl:  .  umeclidinium-vilanterol (ANORO ELLIPTA) 62.5-25 MCG/INH AEPB, Inhale 1 puff into the lungs daily. (Patient not taking: Reported on 12/25/2018), Disp: 60 each, Rfl: 5 .  umeclidinium-vilanterol (ANORO ELLIPTA) 62.5-25 MCG/INH AEPB, Inhale 1 puff into the lungs daily. (Patient not taking: Reported on 12/25/2018), Disp: 2 each, Rfl: 0  Review of Systems  Constitutional: Negative.  Negative for activity change, appetite change, chills, diaphoresis, fatigue, fever and unexpected weight change.  Respiratory: Negative for apnea, cough, choking, chest tightness, shortness of breath, wheezing and stridor.   Cardiovascular: Negative for  chest pain, palpitations and leg swelling.  Gastrointestinal: Negative.   Musculoskeletal: Positive for arthralgias and gait problem (Hurts to walk on right foot with pressure.,  She is able to walk just taking longer than normal.). Negative for back pain.  Skin: Positive for color change. Negative for pallor, rash and wound.  Neurological: Negative for dizziness, tingling, tremors, seizures, syncope, facial asymmetry, speech difficulty, weakness, light-headedness, numbness and headaches.  Psychiatric/Behavioral: Negative.     Social History   Tobacco Use  . Smoking status: Never Smoker  . Smokeless tobacco: Never Used  Substance Use Topics  . Alcohol use: Never    Frequency: Never      Objective:   BP 118/62   Pulse 87   Temp (!) 96.2 F (35.7 C) (Oral)   Resp 15   Wt 167 lb 9.6 oz (76 kg)   SpO2 97%   BMI 27.89 kg/m  Vitals:   12/25/18 0907  BP: 118/62  Pulse: 87  Resp: 15  Temp: (!) 96.2 F (35.7 C)  TempSrc: Oral  SpO2: 97%  Weight: 167 lb 9.6 oz (76 kg)  Body mass index is 27.89 kg/m.   Physical Exam Vitals signs reviewed.  Constitutional:      Appearance: Normal appearance.  HENT:     Head: Normocephalic and atraumatic.     Mouth/Throat:     Mouth: Mucous membranes are moist.     Pharynx: No oropharyngeal exudate or posterior oropharyngeal erythema.  Eyes:     Extraocular Movements: Extraocular movements intact.     Pupils: Pupils are equal, round, and reactive to light.  Neck:     Musculoskeletal: Normal range of motion and neck supple.  Cardiovascular:     Rate and Rhythm: Normal rate and regular rhythm.     Pulses: Normal pulses.          Dorsalis pedis pulses are 2+ on the right side and 2+ on the left side.       Posterior tibial pulses are 2+ on the right side and 2+ on the left side.     Heart sounds: Normal heart sounds. No murmur. No friction rub. No gallop.   Pulmonary:     Effort: Pulmonary effort is normal. No respiratory distress.      Breath sounds: Normal breath sounds. No stridor. No wheezing, rhonchi or rales.  Chest:     Chest wall: No tenderness.  Abdominal:     Palpations: Abdomen is soft.  Musculoskeletal:        General: Swelling and tenderness present. No deformity or signs of injury.     Right ankle: She exhibits no ecchymosis, no laceration and normal pulse. Achilles tendon exhibits no pain, no defect and normal Thompson's test results.     Right lower leg: Edema (foot with mild swelling at distal metatarsals lateral across foot, localized swelling and erythema to that area,  mild localized warmth. ) present.     Left lower leg: No edema.     Right foot: Decreased range of motion (pain with dorsiflexion ). Normal capillary refill. Tenderness, bony tenderness and swelling present. No crepitus, deformity or laceration.       Feet:  Feet:     Right foot:     Protective Sensation: 2 sites tested. 2 sites sensed.     Skin integrity: Erythema and warmth present. No ulcer, blister, skin breakdown, callus, dry skin or fissure.     Toenail Condition: Right toenails are normal.     Left foot:     Protective Sensation: 2 sites tested. 2 sites sensed.     Skin integrity: No ulcer, blister, skin breakdown, erythema, warmth, callus or fissure.     Toenail Condition: Left toenails are normal.     Comments: Painted toenails.  Foot with mild swelling of dorsal surface at distal metatarsals lateral across foot, localized swelling and erythema to that area, mild localized warmth. Tenderness with palpation, bone seems to be tender as well however could just be from pressure on soft tissue. Appears bruised.  Mild tenderness with palpation of plantar surface as on diagram. Lymphadenopathy:     Cervical: No cervical adenopathy.  Skin:    General: Skin is warm and dry.     Capillary Refill: Capillary refill takes less than 2 seconds.  Neurological:     Mental Status: She is oriented to person, place, and time.  Psychiatric:         Mood and Affect: Mood normal.        Behavior: Behavior normal.        Thought Content: Thought content normal.        Judgment: Judgment normal.          Assessment & Plan    1. Right foot pain- after pedicure  Likely soft tissue injury and suspect possible fracture of metatarsal and deep tissue massage at the nail salon, even patient's history.  The patient has some bony tenderness will obtain x-ray of the right foot.   Very low suspicion for DVT, though I did discuss with daughter and patient that this differential has to be considered and that if symptoms change worsen or do not improve at any time or she has any new symptoms occurring with this that she should proceed to the nearest emergency room or call 911.  2. Cellulitis of other specified site- foot  Meds ordered this encounter  Medications  . cephALEXin (KEFLEX) 500 MG capsule    Sig: Take 1 capsule (500 mg total) by mouth 2 (two) times daily.    Dispense:  14 capsule    Refill:  0  . losartan (COZAAR) 100 MG tablet    Sig: Take 1 tablet (100 mg total) by mouth daily.    Dispense:  90 tablet    Refill:  1    Please discontinue order for telmisartan and also ibersartan if you have those on file. Only to give losartan as prescribed. Thanks !    3. Bruise Apply a compressive ACE bandage. Rest and elevate the affected painful area.  Apply cold compresses intermittently as needed.  As pain recedes, begin normal activities slowly as tolerated.  Call if symptoms persist. Remove ace bandage every 2- 4 hours to check foot and do not wrap too tight.    4. Encounter for medication refill Refilled blood pressure medication losartan at patient's request.  Also had medical assistant  clarify with the pharmacy that patient should only be taken losartan, she should not be taking telmisartan or irbesartan.  Patient reported to the provider that she is taking losartan only, but that the pharmacy keeps giving her all of the  medications.  Advised patient only to take the losartan and that we would clarify with the pharmacy.  I discontinued the other medications in the medication list. Orders Placed This Encounter  Procedures  . DG Foot Complete Right  . CBC with Differential/Platelet [id number]  . Comprehensive Metabolic Panel (CMET)   Return in about 3 days (around 12/28/2018), or if symptoms worsen or fail to improve.   The entirety of the information documented in the History of Present Illness, Review of Systems and Physical Exam were personally obtained by me. Portions of this information were initially documented by the  Certified Medical Assistant whose name is documented in [place] and reviewed by me for thoroughness and accuracy.  I have personally performed the exam and reviewed the chart and it is accurate to the best of my knowledge.  Haematologist has been used and any errors in dictation or transcription are unintentional.  Kelby Aline. [place], [facility]

## 2018-12-25 ENCOUNTER — Ambulatory Visit
Admission: RE | Admit: 2018-12-25 | Discharge: 2018-12-25 | Disposition: A | Discharge status: Home / Self Care | Payer: Medicare Other | Source: Ambulatory Visit | Attending: Adult Health | Admitting: Adult Health

## 2018-12-25 ENCOUNTER — Telehealth: Payer: Self-pay

## 2018-12-25 ENCOUNTER — Telehealth: Payer: Self-pay | Admitting: Adult Health

## 2018-12-25 ENCOUNTER — Other Ambulatory Visit: Payer: Self-pay

## 2018-12-25 ENCOUNTER — Encounter: Payer: Self-pay | Admitting: Adult Health

## 2018-12-25 ENCOUNTER — Ambulatory Visit (INDEPENDENT_AMBULATORY_CARE_PROVIDER_SITE_OTHER): Payer: Medicare Other | Admitting: Adult Health

## 2018-12-25 VITALS — BP 118/62 | HR 87 | Temp 96.2°F | Resp 15 | Wt 167.6 lb

## 2018-12-25 DIAGNOSIS — L03818 Cellulitis of other sites: Secondary | ICD-10-CM

## 2018-12-25 DIAGNOSIS — M79671 Pain in right foot: Secondary | ICD-10-CM | POA: Diagnosis not present

## 2018-12-25 DIAGNOSIS — T148XXA Other injury of unspecified body region, initial encounter: Secondary | ICD-10-CM

## 2018-12-25 DIAGNOSIS — S92309A Fracture of unspecified metatarsal bone(s), unspecified foot, initial encounter for closed fracture: Secondary | ICD-10-CM | POA: Insufficient documentation

## 2018-12-25 DIAGNOSIS — Z760 Encounter for issue of repeat prescription: Secondary | ICD-10-CM | POA: Diagnosis not present

## 2018-12-25 MED ORDER — CEPHALEXIN 500 MG PO CAPS: 500.0000 mg | ORAL_CAPSULE | Freq: Two times a day (BID) | ORAL | 0 refills | Status: DC

## 2018-12-25 MED ORDER — LOSARTAN POTASSIUM 100 MG PO TABS: 100.0000 mg | ORAL_TABLET | Freq: Every day | ORAL | 1 refills | Status: DC

## 2018-12-25 NOTE — Telephone Encounter (Addendum)
    Left message for Hoyle Sauer daughter at office visit to return call to office to go over results as below she does have a fracture of distal shaft of metatarsal and possible healed fracture of midshaft second metatarsal.  Provider obtained an appointment for patient at [facility] today 12/25/2018 [place] office at 2:30pm with Tessa Lerner PA-C or she can go to their walk in clinic that starts at 1pm.    Patient's daughter returned call and verbalized understanding and will proceed with appointment at 26 today with [facility].  She is advised to call if she has any questions or concerns or needs any additional help.  Narrative & Impression    CLINICAL DATA:  Pain and swelling of the forefoot for 1 week.  EXAM: RIGHT FOOT COMPLETE - 3+ VIEW  COMPARISON:  None.  FINDINGS: There is a fracture of the distal shaft of the second metatarsal with slight angulation and impaction. Focal area of cortical thickening of the midshaft of the second metatarsal may represent healing of a previous stress fracture.  Hallux valgus deformity with bunion formation. Calcaneal enthesophytes.  IMPRESSION: 1. Fracture of the distal shaft of the second metatarsal. 2. Probable healed stress fracture of the midshaft of the second metatarsal. 3. Hallux valgus deformity with bunion formation.   Electronically Signed   By: Lorriane Shire M.D.   On: 12/25/2018 10:45

## 2018-12-25 NOTE — Telephone Encounter (Signed)
patient and daughter reported to me she is taking Losartan - so I have sent the refill to pharmacy today 12/25/18 and she will not take the Telmisartan or Irbersartan as they both reported patient was not taking but that the pharmacy has been giving both.  Thanks for informing the pharmacy [place].  I think maybe there was confusion when there was lot recall in the past.

## 2018-12-25 NOTE — Patient Instructions (Signed)
Cellulitis, Adult  Cellulitis is a skin infection. The infected area is often warm, red, swollen, and sore. It occurs most often in the arms and lower legs. It is very important to get treated for this condition. What are the causes? This condition is caused by bacteria. The bacteria enter through a break in the skin, such as a cut, burn, insect bite, open sore, or crack. What increases the risk? This condition is more likely to occur in people who:  Have a weak body defense system (immune system).  Have open cuts, burns, bites, or scrapes on the skin.  Are older than 83 years of age.  Have a blood sugar problem (diabetes).  Have a long-lasting (chronic) liver disease (cirrhosis) or kidney disease.  Are very overweight (obese).  Have a skin problem, such as: ? Itchy rash (eczema). ? Slow movement of blood in the veins (venous stasis). ? Fluid buildup below the skin (edema).  Have been treated with high-energy rays (radiation).  Use IV drugs. What are the signs or symptoms? Symptoms of this condition include:  Skin that is: ? Red. ? Streaking. ? Spotting. ? Swollen. ? Sore or painful when you touch it. ? Warm.  A fever.  Chills.  Blisters. How is this diagnosed? This condition is diagnosed based on:  Medical history.  Physical exam.  Blood tests.  Imaging tests. How is this treated? Treatment for this condition may include:  Medicines to treat infections or allergies.  Home care, such as: ? Rest. ? Placing cold or warm cloths (compresses) on the skin.  Hospital care, if the condition is very bad. Follow these instructions at home: Medicines  Take over-the-counter and prescription medicines only as told by your doctor.  If you were prescribed an antibiotic medicine, take it as told by your doctor. Do not stop taking it even if you start to feel better. General instructions   Drink enough fluid to keep your pee (urine) pale yellow.  Do not touch  or rub the infected area.  Raise (elevate) the infected area above the level of your heart while you are sitting or lying down.  Place cold or warm cloths on the area as told by your doctor.  Keep all follow-up visits as told by your doctor. This is important. Contact a doctor if:  You have a fever.  You do not start to get better after 1-2 days of treatment.  Your bone or joint under the infected area starts to hurt after the skin has healed.  Your infection comes back. This can happen in the same area or another area.  You have a swollen bump in the area.  You have new symptoms.  You feel ill and have muscle aches and pains. Get help right away if:  Your symptoms get worse.  You feel very sleepy.  You throw up (vomit) or have watery poop (diarrhea) for a long time.  You see red streaks coming from the area.  Your red area gets larger.  Your red area turns dark in color. These symptoms may represent a serious problem that is an emergency. Do not wait to see if the symptoms will go away. Get medical help right away. Call your local emergency services (911 in the [place] Do not drive yourself to the hospital. Summary  Cellulitis is a skin infection. The area is often warm, red, swollen, and sore.  This condition is treated with medicines, rest, and cold and warm cloths.  Take all medicines only   as told by your doctor.  Tell your doctor if symptoms do not start to get better after 1-2 days of treatment. This information is not intended to replace advice given to you by your health care provider. Make sure you discuss any questions you have with your health care provider. Document Released: 07/06/2007 Document Revised: 06/08/2017 Document Reviewed: 06/08/2017 Elsevier Patient Education  [facility] Many things can cause foot pain. Some common causes are:  An injury.  A sprain.  Arthritis.  Blisters.  Bunions. Follow these instructions at home:  Managing pain, stiffness, and swelling If directed, put ice on the painful area:  Put ice in a plastic bag.  Place a towel between your skin and the bag.  Leave the ice on for 20 minutes, 2-3 times a day.  Activity  Do not stand or walk for long periods.  Return to your normal activities as told by your health care provider. Ask your health care provider what activities are safe for you.  Do stretches to relieve foot pain and stiffness as told by your health care provider.  Do not lift anything that is heavier than 10 lb (4.5 kg), or the limit that you are told, until your health care provider says that it is safe. Lifting a lot of weight can put added pressure on your feet. Lifestyle  Wear comfortable, supportive shoes that fit you well. Do not wear high heels.  Keep your feet clean and dry. General instructions  Take over-the-counter and prescription medicines only as told by your health care provider.  Rub your foot gently.  Pay attention to any changes in your symptoms.  Keep all follow-up visits as told by your health care provider. This is important. Contact a health care provider if:  Your pain does not get better after a few days of self-care.  Your pain gets worse.  You cannot stand on your foot. Get help right away if:  Your foot is numb or tingling.  Your foot or toes are swollen.  Your foot or toes turn white or blue.  You have warmth and redness along your foot. Summary  Common causes of foot pain are injury, sprain, arthritis, blisters or bunions.  Ice, medicines, and comfortable shoes may help foot pain.  Contact your health care provider if your pain does not get better after a few days of self-care. This information is not intended to replace advice given to you by your health care provider. Make sure you discuss any questions you have with your health care provider.   Apply a compressive ACE bandage. Rest and elevate the affected painful  area.  Apply cold compresses intermittently as needed.  As pain recedes, begin normal activities slowly as tolerated.  Call if symptoms persist.  Document Released: 02/13/2015 Document Revised: 11/02/2017 Document Reviewed: 11/02/2017 Elsevier Patient Education  2020 [employer].

## 2018-12-25 NOTE — Telephone Encounter (Signed)
Spoke with pharmacist on the phone and discontinued Telmisartan and Irbesartan, pharmacist states that patient has not had a refill on Losartan since April. She states that you would have to send in a new prescription to pharmacy for Losartan. KW

## 2018-12-25 NOTE — Telephone Encounter (Signed)
-----   Message from Doreen Beam, [place] sent at 12/25/2018  9:53 AM EST ----- Will you please call the pharmacy and cancel the telmisartan I just discontinued. She should only be taking one Losartan as on her medication list. Please verify with the pharmacy not to give her both. She should only be on one sartan drug. Be sure they are not giving her the irbesartan as well ( it not on her current med list here)  it should be discontinued. Daughter said they had been giving her two medications.

## 2018-12-25 NOTE — Progress Notes (Signed)
[facility] appointment scheduled today 12/25/18 at 2:30 today, with Ramos PA-C. Daughter Hoyle Sauer verbalized understanding.

## 2018-12-26 LAB — COMPREHENSIVE METABOLIC PANEL
ALT: 11 IU/L (ref 0–32)
AST: 18 IU/L (ref 0–40)
Albumin/Globulin Ratio: 1.6 (ref 1.2–2.2)
Albumin: 4.1 g/dL (ref 3.6–4.6)
Alkaline Phosphatase: 123 IU/L — ABNORMAL HIGH (ref 39–117)
BUN/Creatinine Ratio: 17 (ref 12–28)
BUN: 20 mg/dL (ref 8–27)
Bilirubin Total: 0.3 mg/dL (ref 0.0–1.2)
CO2: 21 mmol/L (ref 20–29)
Calcium: 9.3 mg/dL (ref 8.7–10.3)
Chloride: 105 mmol/L (ref 96–106)
Creatinine, Ser: 1.2 mg/dL — ABNORMAL HIGH (ref 0.57–1.00)
GFR calc Af Amer: 47 mL/min/{1.73_m2} — ABNORMAL LOW (ref >59)
GFR calc non Af Amer: 41 mL/min/{1.73_m2} — ABNORMAL LOW (ref >59)
Globulin, Total: 2.5 g/dL (ref 1.5–4.5)
Glucose: 97 mg/dL (ref 65–99)
Potassium: 4.5 mmol/L (ref 3.5–5.2)
Sodium: 141 mmol/L (ref 134–144)
Total Protein: 6.6 g/dL (ref 6.0–8.5)

## 2018-12-26 LAB — CBC WITH DIFFERENTIAL/PLATELET
Basophils Absolute: 0 10*3/uL (ref 0.0–0.2)
Basos: 1 %
EOS (ABSOLUTE): 0.1 10*3/uL (ref 0.0–0.4)
Eos: 1 %
Hematocrit: 33.9 % — ABNORMAL LOW (ref 34.0–46.6)
Hemoglobin: 10.9 g/dL — ABNORMAL LOW (ref 11.1–15.9)
Immature Grans (Abs): 0 10*3/uL (ref 0.0–0.1)
Immature Granulocytes: 0 %
Lymphocytes Absolute: 0.8 10*3/uL (ref 0.7–3.1)
Lymphs: 16 %
MCH: 30.6 pg (ref 26.6–33.0)
MCHC: 32.2 g/dL (ref 31.5–35.7)
MCV: 95 fL (ref 79–97)
Monocytes Absolute: 0.5 10*3/uL (ref 0.1–0.9)
Monocytes: 10 %
Neutrophils Absolute: 3.5 10*3/uL (ref 1.4–7.0)
Neutrophils: 72 %
Platelets: 217 10*3/uL (ref 150–450)
RBC: 3.56 x10E6/uL — ABNORMAL LOW (ref 3.77–5.28)
RDW: 15.3 % (ref 11.7–15.4)
WBC: 4.9 10*3/uL (ref 3.4–10.8)

## 2018-12-31 ENCOUNTER — Telehealth: Payer: Self-pay

## 2018-12-31 NOTE — Telephone Encounter (Signed)
-----   Message from Doreen Beam, [place] sent at 12/31/2018  9:03 AM EST ----- Would the lab still be able to run a Iron, TIBC, ferritin level ?

## 2018-12-31 NOTE — Telephone Encounter (Signed)
Spoke with Suanne Marker from [facility] and added additional labs, she will fax over a confirmation form for me to sign and fax back if there is enough sample to run additional labs, if not fax will states unable to run. I should receive a response for outcome in the next 24hrs. KW

## 2019-01-03 LAB — IRON,TIBC AND FERRITIN PANEL
Ferritin: 33 ng/mL (ref 15–150)
Iron Saturation: 14 % — ABNORMAL LOW (ref 15–55)
Iron: 48 ug/dL (ref 27–139)
Total Iron Binding Capacity: 346 ug/dL (ref 250–450)
UIBC: 298 ug/dL (ref 118–369)

## 2019-01-03 LAB — SPECIMEN STATUS REPORT

## 2019-01-04 ENCOUNTER — Telehealth: Payer: Self-pay

## 2019-01-04 NOTE — Telephone Encounter (Signed)
-----   Message from Doreen Beam, [place] sent at 01/04/2019  8:13 AM EST ----- Please call patient or daughter on file and follow-up to see how she is doing with her foot fracture.  Let her know that her kidney function is a  decreased, she should avoid ibuprofen or NSAIDs, and hydrate.  She is slightly more anemic than she was 2 years ago.  Please verify that she is not having any rectal or vaginal bleeding.  Discussed with Dr. Rosanna Randy does not recommend starting iron due to side effects.  She can incorporate some more iron rich foods in her diet.  Let me know if they have any questions.  Thanks so much

## 2019-01-04 NOTE — Telephone Encounter (Signed)
[id number] 

## 2019-01-04 NOTE — Progress Notes (Signed)
Please call patient or daughter on file and follow-up to see how she is doing with her foot fracture.  Let her know that her kidney function is a  decreased, she should avoid ibuprofen or NSAIDs, and hydrate.  She is slightly more anemic than she was 2 years ago.  Please verify that she is not having any rectal or vaginal bleeding.  Discussed with Dr. Rosanna Randy does not recommend starting iron due to side effects.  She can incorporate some more iron rich foods in her diet.  Let me know if they have any questions.  Thanks so much

## 2019-01-27 ENCOUNTER — Other Ambulatory Visit: Payer: Self-pay | Admitting: Cardiovascular Disease

## 2019-02-16 ENCOUNTER — Other Ambulatory Visit: Payer: Self-pay | Admitting: Family Medicine

## 2019-02-16 DIAGNOSIS — M2550 Pain in unspecified joint: Secondary | ICD-10-CM

## 2019-02-27 DIAGNOSIS — S92324A Nondisplaced fracture of second metatarsal bone, right foot, initial encounter for closed fracture: Secondary | ICD-10-CM | POA: Diagnosis not present

## 2019-03-12 ENCOUNTER — Ambulatory Visit: Payer: Medicare Other | Admitting: Cardiovascular Disease

## 2019-03-25 DIAGNOSIS — M170 Bilateral primary osteoarthritis of knee: Secondary | ICD-10-CM | POA: Diagnosis not present

## 2019-03-25 DIAGNOSIS — S92324A Nondisplaced fracture of second metatarsal bone, right foot, initial encounter for closed fracture: Secondary | ICD-10-CM | POA: Diagnosis not present

## 2019-04-04 NOTE — Progress Notes (Signed)
Cardiology Office Note  Date:  04/05/2019   ID:  Terri Braun, DOB [birth year], MRN [id number]  PCP:  Jerrol Banana., MD   Chief Complaint  Patient presents with  . other    OD 12 month f/u c/o left rib cage pain when laying down.  Pt didn't bring her medications with her and the list that she has isn't correct. She will give [name] a call to update her medications. Meds reviewed verbally with pt.    HPI:  [name] is a 86 year old woman with past medical history of GERD COPD coronary artery disease  right coronary artery stent 2012 with repeat catheterization 2015 confirming patent stent Chronic SOB status post Nissen fundoplication,  liver disease  hepatitis ,  MZ alpha-1 antitrypsin phenotype, sessile serrated polyp of the cecum that has been biopsied in 2013 in 2017 but not removed.   moved from [facility] Who presents to establish for her coronary artery disease  Presents today in a wheelchair, 3 broken bones in her right foot, limiting her walking and weightbearing Report she has osteoporosis, On prior clinic visit was walking with a cane  Blood pressure stable, tolerating Crestor 10 mg daily Did not bring medication list with her  EKG personally reviewed by myself on todays visit Shows normal sinus rhythm rate 70 bpm no significant ST-T wave changes  EKG personally reviewed by myself on todays visit Shows normal sinus rhythm rate 71 bpm no significant ST or T wave changes   Echocardiogram reviewed from February 2017 showing normal LV function mild AI and MR  Cardiac catheterization January 06, 2014 showing patent proximal RCA stent 50% ostial narrowing followed by 30% in-stent restenosis, smaller PDA 40% disease 20 to 30% ostial left circumflex 20% up to 30% LAD disease   PMH:   has a past medical history of Anemia, ASCVD (arteriosclerotic cardiovascular disease), COPD (chronic obstructive pulmonary disease) ([place]), Diverticulosis, GERD  (gastroesophageal reflux disease), Hypertension, and Hypothyroidism.  PSH:    Past Surgical History:  Procedure Laterality Date  . APPENDECTOMY    . BREAST SURGERY  1983   Lumpectomy  . CHOLECYSTECTOMY    . COLON SURGERY    . COLONOSCOPY  11/04/2015  . CORONARY ANGIOPLASTY WITH STENT PLACEMENT    . NISSEN FUNDOPLICATION    . TOTAL ABDOMINAL HYSTERECTOMY     With BSO    Current Outpatient Medications  Medication Sig Dispense Refill  . celecoxib (CELEBREX) 100 MG capsule TAKE 1 CAPSULE BY MOUTH EVERY DAY 30 capsule 11  . clopidogrel (PLAVIX) 75 MG tablet TAKE 1 TABLET BY MOUTH EVERY DAY 90 tablet 0  . DULoxetine (CYMBALTA) 60 MG capsule Take 60 mg by mouth daily.    Marland Kitchen losartan (COZAAR) 100 MG tablet Take 1 tablet (100 mg total) by mouth daily. 90 tablet 1  . rosuvastatin (CRESTOR) 10 MG tablet TAKE 1 TABLET BY MOUTH EVERY DAY 90 tablet 0  . umeclidinium-vilanterol (ANORO ELLIPTA) 62.5-25 MCG/INH AEPB Inhale 1 puff into the lungs daily. 60 each 5  . cephALEXin (KEFLEX) 500 MG capsule Take 1 capsule (500 mg total) by mouth 2 (two) times daily. (Patient not taking: Reported on 04/05/2019) 14 capsule 0  . esomeprazole (NEXIUM) 20 MG capsule Take 20 mg by mouth daily at 12 noon.    . gabapentin (NEURONTIN) 100 MG capsule TAKE 1 CAPSULE BY MOUTH THREE TIMES A DAY (Patient not taking: Reported on 12/25/2018) 90 capsule 3  . isosorbide mononitrate (IMDUR) 30 MG 24  hr tablet Take 1 tablet (30 mg total) by mouth daily. (Patient not taking: Reported on 12/25/2018) 90 tablet 3  . nitroGLYCERIN (NITROSTAT) 0.4 MG SL tablet Place under the tongue.    . traMADol (ULTRAM) 50 MG tablet      No current facility-administered medications for this visit.     Allergies:   Shellfish allergy   Social History:  The patient  reports that she has never smoked. She has never used smokeless tobacco. She reports that she does not drink alcohol or use drugs.   Family History:   family history includes Cancer in  an other family member; Coronary artery disease in her mother; Crohn's disease in her son; Heart disease in her mother.    Review of Systems: Review of Systems  Constitutional: Negative.   HENT: Negative.   Respiratory: Negative.   Cardiovascular: Negative.   Gastrointestinal: Negative.   Musculoskeletal: Positive for back pain.       Foot pain  Neurological: Negative.   Psychiatric/Behavioral: Negative.   All other systems reviewed and are negative.   PHYSICAL EXAM: VS:  BP 120/60 (BP Location: Right Arm, Patient Position: Sitting, Cuff Size: Normal)   Pulse 70   Ht 5\' 5"  (1.651 m)   Wt 163 lb 4 oz (74 kg)   SpO2 98%   BMI 27.17 kg/m  , BMI Body mass index is 27.17 kg/m. Constitutional:  oriented to person, place, and time. No distress.  Presenting in a wheelchair HENT:  Head: Grossly normal Eyes:  no discharge. No scleral icterus.  Neck: No JVD, no carotid bruits  Cardiovascular: Regular rate and rhythm, no murmurs appreciated Pulmonary/Chest: Clear to auscultation bilaterally, no wheezes or rails Abdominal: Soft.  no distension.  no tenderness.  Musculoskeletal: Normal range of motion shoe/boot on the right foot Neurological:  normal muscle tone. Coordination normal. No atrophy Skin: Skin warm and dry Psychiatric: normal affect, pleasant   Recent Labs: 12/25/2018: ALT 11; BUN 20; Creatinine, Ser 1.20; Hemoglobin 10.9; Platelets 217; Potassium 4.5; Sodium 141    Lipid Panel Lab Results  Component Value Date   CHOL 235 (A) 09/14/2015   HDL 79 (A) 09/14/2015   LDLCALC 121 09/14/2015   TRIG 175 (A) 09/14/2015      Wt Readings from Last 3 Encounters:  04/05/19 163 lb 4 oz (74 kg)  12/25/18 167 lb 9.6 oz (76 kg)  03/28/18 174 lb (78.9 kg)     ASSESSMENT AND PLAN:  Atherosclerosis of native coronary artery of native heart with stable angina pectoris [facility])  cardiac catheterization December 2015 Continue Crestor We will order lipid panel  today  Centrilobular emphysema (HCC) Reports symptoms are stable, very sedentary at baseline  Gastroesophageal reflux disease, esophagitis presence not specified Known polyp No dysphagia, or significant GERD symptoms  Anemia, unspecified type Hemoglobin 10.9 three months ago  Gait instability Fractures in the feet, minimal weightbearing/wears a shoe Has been very slow to heal past 2 months  Fall, sequela Long discussion concerning need to work on her lower extremity muscles,  Doing exercise regiment She will work with PT at [facility]  Mixed hyperlipidemia Orders for lipids today  Disposition:   F/U  12 months   Total encounter time more than 25 minutes  Greater than 50% was spent in counseling and coordination of care with the patient   Orders Placed This Encounter  Procedures  . EKG 12-Lead     Signed, Esmond Plants, M.D., Ph.D. 04/05/2019  [place], [place]

## 2019-04-05 ENCOUNTER — Ambulatory Visit (INDEPENDENT_AMBULATORY_CARE_PROVIDER_SITE_OTHER): Payer: Medicare HMO | Admitting: Cardiovascular Disease

## 2019-04-05 ENCOUNTER — Encounter: Payer: Self-pay | Admitting: Cardiovascular Disease

## 2019-04-05 ENCOUNTER — Other Ambulatory Visit: Payer: Self-pay

## 2019-04-05 VITALS — BP 120/60 | HR 70 | Ht 65.0 in | Wt 163.2 lb

## 2019-04-05 DIAGNOSIS — E782 Mixed hyperlipidemia: Secondary | ICD-10-CM | POA: Diagnosis not present

## 2019-04-05 DIAGNOSIS — J432 Centrilobular emphysema: Secondary | ICD-10-CM

## 2019-04-05 DIAGNOSIS — I25118 Atherosclerotic heart disease of native coronary artery with other forms of angina pectoris: Secondary | ICD-10-CM | POA: Diagnosis not present

## 2019-04-05 DIAGNOSIS — R2681 Unsteadiness on feet: Secondary | ICD-10-CM

## 2019-04-05 DIAGNOSIS — I10 Essential (primary) hypertension: Secondary | ICD-10-CM

## 2019-04-05 DIAGNOSIS — D649 Anemia, unspecified: Secondary | ICD-10-CM | POA: Diagnosis not present

## 2019-04-05 MED ORDER — NITROGLYCERIN 0.4 MG SL SUBL: 0.4000 mg | SUBLINGUAL_TABLET | SUBLINGUAL | 3 refills | Status: DC | PRN

## 2019-04-05 NOTE — Patient Instructions (Addendum)
Medication Instructions:  No changes  If you need a refill on your cardiac medications before your next appointment, please call your pharmacy.    Lab work: Lipid panel today   If you have labs (blood work) drawn today and your tests are completely normal, you will receive your results only by: Marland Kitchen MyChart Message (if you have MyChart) OR . A paper copy in the mail If you have any lab test that is abnormal or we need to change your treatment, we will call you to review the results.   Testing/Procedures: No new testing needed   Follow-Up: At [facility], you and your health needs are our priority.  As part of our continuing mission to provide you with exceptional heart care, we have created designated Provider Care Teams.  These Care Teams include your primary Cardiologist (physician) and Advanced Practice Providers (APPs -  Physician Assistants and Nurse Practitioners) who all work together to provide you with the care you need, when you need it.  . You will need a follow up appointment in 12 months   . Providers on your designated Care Team:   . Murray Hodgkins, NP . Christell Faith, PA-C . Marrianne Mood, PA-C  Any Other Special Instructions Will Be Listed Below (If Applicable).  For educational health videos Log in to : [email or web address] Or : [email or web address], password : [email or web address]

## 2019-04-06 LAB — LIPID PANEL
Chol/HDL Ratio: 1.7 ratio (ref 0.0–4.4)
Cholesterol, Total: 134 mg/dL (ref 100–199)
HDL: 81 mg/dL (ref >39)
LDL Chol Calc (NIH): 36 mg/dL (ref 0–99)
Triglycerides: 90 mg/dL (ref 0–149)
VLDL Cholesterol Cal: 17 mg/dL (ref 5–40)

## 2019-04-11 ENCOUNTER — Telehealth: Payer: Self-pay | Admitting: Cardiovascular Disease

## 2019-04-11 NOTE — Telephone Encounter (Signed)
Patient;'s daughter calling in after comparing patients medications to in office med list.  The differences include: - keflex- not taking - gabapentin- not taking - tramadol not taking  Please advise

## 2019-04-12 NOTE — Telephone Encounter (Signed)
Med list updated per daughter request. Left detailed message to make her aware.   No further orders at this time.   Advised pt to call for any further questions or concerns.

## 2019-04-22 DIAGNOSIS — S92324A Nondisplaced fracture of second metatarsal bone, right foot, initial encounter for closed fracture: Secondary | ICD-10-CM | POA: Diagnosis not present

## 2019-04-23 ENCOUNTER — Other Ambulatory Visit: Payer: Self-pay | Admitting: Cardiovascular Disease

## 2019-05-13 ENCOUNTER — Telehealth: Payer: Self-pay | Admitting: Cardiovascular Disease

## 2019-05-13 ENCOUNTER — Other Ambulatory Visit: Payer: Self-pay

## 2019-05-13 MED ORDER — ROSUVASTATIN CALCIUM 10 MG PO TABS: 10.0000 mg | ORAL_TABLET | Freq: Every day | ORAL | 0 refills | Status: DC

## 2019-05-13 NOTE — Telephone Encounter (Signed)
*  STAT* If patient is at the pharmacy, call can be transferred to refill team.   1. Which medications need to be refilled? (please list name of each medication and dose if known)  Rosuvastatin 10 mg po q d     2. Which pharmacy/location (including street and city if local pharmacy) is medication to be sent to?  [facility] [address] [place]   3. Do they need a 30 day or 90 day supply?  [place]

## 2019-05-13 NOTE — Telephone Encounter (Signed)
Disp Refills Start End   rosuvastatin (CRESTOR) 10 MG tablet 90 tablet 0 05/13/2019    Sig - Route: Take 1 tablet (10 mg total) by mouth daily. - Oral   Pharmacy  [id number] [address], [place]

## 2019-05-20 DIAGNOSIS — S92324A Nondisplaced fracture of second metatarsal bone, right foot, initial encounter for closed fracture: Secondary | ICD-10-CM | POA: Diagnosis not present

## 2019-05-21 DIAGNOSIS — H35012 Changes in retinal vascular appearance, left eye: Secondary | ICD-10-CM | POA: Diagnosis not present

## 2019-05-24 NOTE — Progress Notes (Signed)
Established patient visit  I,[name],acting as a scribe for [name], MD.,have documented all relevant documentation on the behalf of [name], MD,as directed by  [name], MD while in the presence of [name], MD.   Patient: Terri Braun   DOB: August 02, 1929   86 y.o. Female  MRN: [id number] Visit Date: 05/29/2019  Today's healthcare provider: [name], MD   No chief complaint on file.  Subjective    HPI Patient is here concerning right foot fracture. Patient has been seeing Dr. Sabra Heck at [facility]. Dr. Sabra Heck advised patient to schedule office visit with pcp to discuss bone density.  Patient has had both Covid vaccines.  She has indications of possible osteoporotic fractures in the past so may need bone density.  She has been on Fosamax for 1 month.  She actually is doing well overall and looks better than she did a year ago    She lives at [facility] independent living and has had physical therapy there before.  She continues to be a fall risk. Emotionally she is doing well needs refill on her duloxetine.  Her daughter brings her in today.                                                                              Medications: Outpatient Medications Prior to Visit  Medication Sig  . acetaminophen (TYLENOL) 500 MG tablet Take 500 mg by mouth every 4 (four) hours as needed.  Marland Kitchen alendronate (FOSAMAX) 70 MG tablet alendronate 70 mg tablet  TAKE 1 TABLET BY MOUTH ONCE WEEKLY  . celecoxib (CELEBREX) 100 MG capsule TAKE 1 CAPSULE BY MOUTH EVERY DAY  . clopidogrel (PLAVIX) 75 MG tablet TAKE 1 TABLET BY MOUTH EVERY DAY  . DULoxetine (CYMBALTA) 60 MG capsule Take 60 mg by mouth daily.  Marland Kitchen esomeprazole (NEXIUM) 20 MG capsule Take 20 mg by mouth daily at 12 noon.  . isosorbide mononitrate (IMDUR) 30 MG 24 hr tablet Take 1 tablet (30 mg total) by mouth daily.    Marland Kitchen losartan (COZAAR) 100 MG tablet Take 1 tablet (100 mg total) by mouth daily.  . nitroGLYCERIN (NITROSTAT) 0.4 MG SL tablet Place 1 tablet (0.4 mg total) under the tongue every 5 (five) minutes as needed for chest pain.  . rosuvastatin (CRESTOR) 10 MG tablet Take 1 tablet (10 mg total) by mouth daily.  Marland Kitchen umeclidinium-vilanterol (ANORO ELLIPTA) 62.5-25 MCG/INH AEPB Inhale 1 puff into the lungs daily.   No facility-administered medications prior to visit.    Review of Systems  Constitutional: Negative for appetite change, chills, fatigue and fever.  HENT: Negative.   Eyes: Negative.   Respiratory: Negative for chest tightness and shortness of breath.   Cardiovascular: Negative for chest pain and palpitations.  Gastrointestinal: Negative for abdominal pain, nausea and vomiting.  Endocrine: Negative.   Musculoskeletal: Positive for arthralgias and gait problem.  Allergic/Immunologic: Negative.   Neurological: Negative for dizziness and weakness.  Hematological: Negative.   Psychiatric/Behavioral: Negative.         Objective    There were no vitals taken for this visit. BP Readings from Last 3 Encounters:  05/29/19 (!) 153/65  04/05/19 120/60  12/25/18 118/62   Wt Readings from Last 3 Encounters:  05/29/19 167 lb (75.8 kg)  04/05/19 163 lb 4 oz (74 kg)  12/25/18 167 lb 9.6 oz (76 kg)   Physical exam vital signs blood pressure 153/65 heart rate 59 temperature 97 [place] rate 16 height 5 foot 5 weight 167 BMI is 27.79 pulse ox is 97% In general she is a alert white female in no acute distress ears are normal sclerae anicteric extraocular motion is intact pupils were equal and round and reactive to light Neck is supple without adenopathy or carotid bruit  Cardiac active exam is normal Lungs are clear Abdomen is soft Extremities without edema.     No results found for any visits on 05/29/19.  Assessment & Plan    1. Osteoporosis, unspecified osteoporosis type, unspecified  pathological fracture presence Patient has now been on Fosamax for 1 month.  Obtain BMD.  We will leave her on this for 5 years of it is appropriate. - DG Bone Density - Ambulatory referral to [place]  2. Unstable gait Refer back to [facility] to lower fall risk. - Ambulatory referral to Physical Therapy - Ambulatory referral to Home Health  3. At high risk for falls  - Ambulatory referral to Physical Therapy - Ambulatory referral to [place]  4. Atherosclerosis of native coronary artery of native heart with stable angina pectoris ([place]) All risk factors treated  5. Heterozygous alpha 1-antitrypsin deficiency ([place])   6. Centrilobular emphysema (HCC) Followed by pulmonary  7. Primary osteoarthritis involving multiple joints   8. Gait instability   9. Carrier of alpha-1-antitrypsin deficiency   10. Mixed hyperlipidemia On Crestor 10   No follow-ups on file.      I, [name], MD, have reviewed all documentation for this visit. The documentation on 06/01/19 for the exam, diagnosis, procedures, and orders are all accurate and complete.    Jeanclaude Wentworth Cranford Mon, MD  [facility] [phone] (phone) [phone] (fax)  [place]

## 2019-05-29 ENCOUNTER — Ambulatory Visit (INDEPENDENT_AMBULATORY_CARE_PROVIDER_SITE_OTHER): Payer: Medicare HMO | Admitting: Family Medicine

## 2019-05-29 ENCOUNTER — Other Ambulatory Visit: Payer: Self-pay

## 2019-05-29 ENCOUNTER — Encounter: Payer: Self-pay | Admitting: Family Medicine

## 2019-05-29 VITALS — BP 153/65 | HR 59 | Temp 96.8°F | Resp 16 | Ht 65.0 in | Wt 167.0 lb

## 2019-05-29 DIAGNOSIS — E8801 Alpha-1-antitrypsin deficiency: Secondary | ICD-10-CM | POA: Diagnosis not present

## 2019-05-29 DIAGNOSIS — M8949 Other hypertrophic osteoarthropathy, multiple sites: Secondary | ICD-10-CM

## 2019-05-29 DIAGNOSIS — Z148 Genetic carrier of other disease: Secondary | ICD-10-CM

## 2019-05-29 DIAGNOSIS — E782 Mixed hyperlipidemia: Secondary | ICD-10-CM

## 2019-05-29 DIAGNOSIS — M159 Polyosteoarthritis, unspecified: Secondary | ICD-10-CM

## 2019-05-29 DIAGNOSIS — R2681 Unsteadiness on feet: Secondary | ICD-10-CM | POA: Diagnosis not present

## 2019-05-29 DIAGNOSIS — I25118 Atherosclerotic heart disease of native coronary artery with other forms of angina pectoris: Secondary | ICD-10-CM | POA: Diagnosis not present

## 2019-05-29 DIAGNOSIS — J432 Centrilobular emphysema: Secondary | ICD-10-CM

## 2019-05-29 DIAGNOSIS — M810 Age-related osteoporosis without current pathological fracture: Secondary | ICD-10-CM | POA: Diagnosis not present

## 2019-05-29 DIAGNOSIS — Z9181 History of falling: Secondary | ICD-10-CM | POA: Diagnosis not present

## 2019-05-29 MED ORDER — DULOXETINE HCL 60 MG PO CPEP: 60.0000 mg | ORAL_CAPSULE | Freq: Every day | ORAL | 5 refills | Status: DC

## 2019-06-21 DIAGNOSIS — I25118 Atherosclerotic heart disease of native coronary artery with other forms of angina pectoris: Secondary | ICD-10-CM | POA: Diagnosis not present

## 2019-06-21 DIAGNOSIS — K769 Liver disease, unspecified: Secondary | ICD-10-CM | POA: Diagnosis not present

## 2019-06-21 DIAGNOSIS — D509 Iron deficiency anemia, unspecified: Secondary | ICD-10-CM | POA: Diagnosis not present

## 2019-06-21 DIAGNOSIS — I10 Essential (primary) hypertension: Secondary | ICD-10-CM | POA: Diagnosis not present

## 2019-06-21 DIAGNOSIS — M150 Primary generalized (osteo)arthritis: Secondary | ICD-10-CM | POA: Diagnosis not present

## 2019-06-21 DIAGNOSIS — M80071D Age-related osteoporosis with current pathological fracture, right ankle and foot, subsequent encounter for fracture with routine healing: Secondary | ICD-10-CM | POA: Diagnosis not present

## 2019-06-21 DIAGNOSIS — J432 Centrilobular emphysema: Secondary | ICD-10-CM | POA: Diagnosis not present

## 2019-06-21 DIAGNOSIS — E8801 Alpha-1-antitrypsin deficiency: Secondary | ICD-10-CM | POA: Diagnosis not present

## 2019-06-21 DIAGNOSIS — J479 Bronchiectasis, uncomplicated: Secondary | ICD-10-CM | POA: Diagnosis not present

## 2019-06-21 DIAGNOSIS — S92324A Nondisplaced fracture of second metatarsal bone, right foot, initial encounter for closed fracture: Secondary | ICD-10-CM | POA: Diagnosis not present

## 2019-06-21 DIAGNOSIS — M797 Fibromyalgia: Secondary | ICD-10-CM | POA: Diagnosis not present

## 2019-06-22 ENCOUNTER — Other Ambulatory Visit: Payer: Self-pay | Admitting: Family Medicine

## 2019-06-22 NOTE — Telephone Encounter (Signed)
Requested Prescriptions  Pending Prescriptions Disp Refills  . DULoxetine (CYMBALTA) 60 MG capsule [Pharmacy Med Name: DULOXETINE HCL DR 60 MG CAP] 90 capsule 1    Sig: TAKE 1 CAPSULE BY MOUTH EVERY DAY     Psychiatry: Antidepressants - SNRI Failed - 06/22/2019 11:33 AM      Failed - Completed PHQ-2 or PHQ-9 in the last 360 days.      Failed - Last BP in normal range    BP Readings from Last 1 Encounters:  05/29/19 (!) 153/65         Passed - Valid encounter within last 6 months    Recent Outpatient Visits          3 weeks ago Osteoporosis, unspecified osteoporosis type, unspecified pathological fracture presence   [facility] Jerrol Banana., MD   5 months ago Right foot pain- after [place] [facility], Kelby Aline, FNP   1 year ago Left-sided low back pain with left-sided sciatica, unspecified chronicity   [facility] Jerrol Banana., MD   1 year ago Gastroesophageal reflux disease, esophagitis presence not specified   [facility] Jerrol Banana., MD

## 2019-06-25 DIAGNOSIS — D509 Iron deficiency anemia, unspecified: Secondary | ICD-10-CM | POA: Diagnosis not present

## 2019-06-25 DIAGNOSIS — I10 Essential (primary) hypertension: Secondary | ICD-10-CM | POA: Diagnosis not present

## 2019-06-25 DIAGNOSIS — I25118 Atherosclerotic heart disease of native coronary artery with other forms of angina pectoris: Secondary | ICD-10-CM | POA: Diagnosis not present

## 2019-06-25 DIAGNOSIS — M797 Fibromyalgia: Secondary | ICD-10-CM | POA: Diagnosis not present

## 2019-06-25 DIAGNOSIS — M150 Primary generalized (osteo)arthritis: Secondary | ICD-10-CM | POA: Diagnosis not present

## 2019-06-25 DIAGNOSIS — M80071D Age-related osteoporosis with current pathological fracture, right ankle and foot, subsequent encounter for fracture with routine healing: Secondary | ICD-10-CM | POA: Diagnosis not present

## 2019-06-25 DIAGNOSIS — J432 Centrilobular emphysema: Secondary | ICD-10-CM | POA: Diagnosis not present

## 2019-06-25 DIAGNOSIS — E8801 Alpha-1-antitrypsin deficiency: Secondary | ICD-10-CM | POA: Diagnosis not present

## 2019-06-25 DIAGNOSIS — J479 Bronchiectasis, uncomplicated: Secondary | ICD-10-CM | POA: Diagnosis not present

## 2019-06-25 DIAGNOSIS — K769 Liver disease, unspecified: Secondary | ICD-10-CM | POA: Diagnosis not present

## 2019-06-28 DIAGNOSIS — K769 Liver disease, unspecified: Secondary | ICD-10-CM | POA: Diagnosis not present

## 2019-06-28 DIAGNOSIS — E8801 Alpha-1-antitrypsin deficiency: Secondary | ICD-10-CM | POA: Diagnosis not present

## 2019-06-28 DIAGNOSIS — D509 Iron deficiency anemia, unspecified: Secondary | ICD-10-CM | POA: Diagnosis not present

## 2019-06-28 DIAGNOSIS — M797 Fibromyalgia: Secondary | ICD-10-CM | POA: Diagnosis not present

## 2019-06-28 DIAGNOSIS — M80071D Age-related osteoporosis with current pathological fracture, right ankle and foot, subsequent encounter for fracture with routine healing: Secondary | ICD-10-CM | POA: Diagnosis not present

## 2019-06-28 DIAGNOSIS — I25118 Atherosclerotic heart disease of native coronary artery with other forms of angina pectoris: Secondary | ICD-10-CM | POA: Diagnosis not present

## 2019-06-28 DIAGNOSIS — M150 Primary generalized (osteo)arthritis: Secondary | ICD-10-CM | POA: Diagnosis not present

## 2019-06-28 DIAGNOSIS — J479 Bronchiectasis, uncomplicated: Secondary | ICD-10-CM | POA: Diagnosis not present

## 2019-06-28 DIAGNOSIS — J432 Centrilobular emphysema: Secondary | ICD-10-CM | POA: Diagnosis not present

## 2019-06-28 DIAGNOSIS — I10 Essential (primary) hypertension: Secondary | ICD-10-CM | POA: Diagnosis not present

## 2019-07-02 ENCOUNTER — Other Ambulatory Visit: Payer: Self-pay | Admitting: Adult Health

## 2019-07-02 ENCOUNTER — Other Ambulatory Visit: Payer: Self-pay | Admitting: Cardiovascular Disease

## 2019-07-03 DIAGNOSIS — I10 Essential (primary) hypertension: Secondary | ICD-10-CM | POA: Diagnosis not present

## 2019-07-03 DIAGNOSIS — J432 Centrilobular emphysema: Secondary | ICD-10-CM | POA: Diagnosis not present

## 2019-07-03 DIAGNOSIS — J479 Bronchiectasis, uncomplicated: Secondary | ICD-10-CM | POA: Diagnosis not present

## 2019-07-03 DIAGNOSIS — E8801 Alpha-1-antitrypsin deficiency: Secondary | ICD-10-CM | POA: Diagnosis not present

## 2019-07-03 DIAGNOSIS — M80071D Age-related osteoporosis with current pathological fracture, right ankle and foot, subsequent encounter for fracture with routine healing: Secondary | ICD-10-CM | POA: Diagnosis not present

## 2019-07-03 DIAGNOSIS — D509 Iron deficiency anemia, unspecified: Secondary | ICD-10-CM | POA: Diagnosis not present

## 2019-07-03 DIAGNOSIS — M797 Fibromyalgia: Secondary | ICD-10-CM | POA: Diagnosis not present

## 2019-07-03 DIAGNOSIS — K769 Liver disease, unspecified: Secondary | ICD-10-CM | POA: Diagnosis not present

## 2019-07-03 DIAGNOSIS — M150 Primary generalized (osteo)arthritis: Secondary | ICD-10-CM | POA: Diagnosis not present

## 2019-07-03 DIAGNOSIS — I25118 Atherosclerotic heart disease of native coronary artery with other forms of angina pectoris: Secondary | ICD-10-CM | POA: Diagnosis not present

## 2019-07-05 DIAGNOSIS — M797 Fibromyalgia: Secondary | ICD-10-CM | POA: Diagnosis not present

## 2019-07-05 DIAGNOSIS — I25118 Atherosclerotic heart disease of native coronary artery with other forms of angina pectoris: Secondary | ICD-10-CM | POA: Diagnosis not present

## 2019-07-05 DIAGNOSIS — J479 Bronchiectasis, uncomplicated: Secondary | ICD-10-CM | POA: Diagnosis not present

## 2019-07-05 DIAGNOSIS — E8801 Alpha-1-antitrypsin deficiency: Secondary | ICD-10-CM | POA: Diagnosis not present

## 2019-07-05 DIAGNOSIS — M150 Primary generalized (osteo)arthritis: Secondary | ICD-10-CM | POA: Diagnosis not present

## 2019-07-05 DIAGNOSIS — M80071D Age-related osteoporosis with current pathological fracture, right ankle and foot, subsequent encounter for fracture with routine healing: Secondary | ICD-10-CM | POA: Diagnosis not present

## 2019-07-05 DIAGNOSIS — J432 Centrilobular emphysema: Secondary | ICD-10-CM | POA: Diagnosis not present

## 2019-07-05 DIAGNOSIS — K769 Liver disease, unspecified: Secondary | ICD-10-CM | POA: Diagnosis not present

## 2019-07-05 DIAGNOSIS — D509 Iron deficiency anemia, unspecified: Secondary | ICD-10-CM | POA: Diagnosis not present

## 2019-07-05 DIAGNOSIS — I10 Essential (primary) hypertension: Secondary | ICD-10-CM | POA: Diagnosis not present

## 2019-07-08 DIAGNOSIS — I25118 Atherosclerotic heart disease of native coronary artery with other forms of angina pectoris: Secondary | ICD-10-CM | POA: Diagnosis not present

## 2019-07-08 DIAGNOSIS — M150 Primary generalized (osteo)arthritis: Secondary | ICD-10-CM | POA: Diagnosis not present

## 2019-07-08 DIAGNOSIS — I10 Essential (primary) hypertension: Secondary | ICD-10-CM | POA: Diagnosis not present

## 2019-07-08 DIAGNOSIS — J432 Centrilobular emphysema: Secondary | ICD-10-CM | POA: Diagnosis not present

## 2019-07-08 DIAGNOSIS — M797 Fibromyalgia: Secondary | ICD-10-CM | POA: Diagnosis not present

## 2019-07-08 DIAGNOSIS — M80071D Age-related osteoporosis with current pathological fracture, right ankle and foot, subsequent encounter for fracture with routine healing: Secondary | ICD-10-CM | POA: Diagnosis not present

## 2019-07-08 DIAGNOSIS — K769 Liver disease, unspecified: Secondary | ICD-10-CM | POA: Diagnosis not present

## 2019-07-08 DIAGNOSIS — J479 Bronchiectasis, uncomplicated: Secondary | ICD-10-CM | POA: Diagnosis not present

## 2019-07-08 DIAGNOSIS — D509 Iron deficiency anemia, unspecified: Secondary | ICD-10-CM | POA: Diagnosis not present

## 2019-07-08 DIAGNOSIS — E8801 Alpha-1-antitrypsin deficiency: Secondary | ICD-10-CM | POA: Diagnosis not present

## 2019-07-15 DIAGNOSIS — I10 Essential (primary) hypertension: Secondary | ICD-10-CM | POA: Diagnosis not present

## 2019-07-15 DIAGNOSIS — I25118 Atherosclerotic heart disease of native coronary artery with other forms of angina pectoris: Secondary | ICD-10-CM | POA: Diagnosis not present

## 2019-07-15 DIAGNOSIS — D509 Iron deficiency anemia, unspecified: Secondary | ICD-10-CM | POA: Diagnosis not present

## 2019-07-15 DIAGNOSIS — M80071D Age-related osteoporosis with current pathological fracture, right ankle and foot, subsequent encounter for fracture with routine healing: Secondary | ICD-10-CM | POA: Diagnosis not present

## 2019-07-15 DIAGNOSIS — K769 Liver disease, unspecified: Secondary | ICD-10-CM | POA: Diagnosis not present

## 2019-07-15 DIAGNOSIS — E8801 Alpha-1-antitrypsin deficiency: Secondary | ICD-10-CM | POA: Diagnosis not present

## 2019-07-15 DIAGNOSIS — M797 Fibromyalgia: Secondary | ICD-10-CM | POA: Diagnosis not present

## 2019-07-15 DIAGNOSIS — M150 Primary generalized (osteo)arthritis: Secondary | ICD-10-CM | POA: Diagnosis not present

## 2019-07-15 DIAGNOSIS — J432 Centrilobular emphysema: Secondary | ICD-10-CM | POA: Diagnosis not present

## 2019-07-15 DIAGNOSIS — J479 Bronchiectasis, uncomplicated: Secondary | ICD-10-CM | POA: Diagnosis not present

## 2019-07-24 DIAGNOSIS — M150 Primary generalized (osteo)arthritis: Secondary | ICD-10-CM | POA: Diagnosis not present

## 2019-07-24 DIAGNOSIS — D509 Iron deficiency anemia, unspecified: Secondary | ICD-10-CM | POA: Diagnosis not present

## 2019-07-24 DIAGNOSIS — E8801 Alpha-1-antitrypsin deficiency: Secondary | ICD-10-CM | POA: Diagnosis not present

## 2019-07-24 DIAGNOSIS — M80071D Age-related osteoporosis with current pathological fracture, right ankle and foot, subsequent encounter for fracture with routine healing: Secondary | ICD-10-CM | POA: Diagnosis not present

## 2019-07-24 DIAGNOSIS — K769 Liver disease, unspecified: Secondary | ICD-10-CM | POA: Diagnosis not present

## 2019-07-24 DIAGNOSIS — M797 Fibromyalgia: Secondary | ICD-10-CM | POA: Diagnosis not present

## 2019-07-24 DIAGNOSIS — J432 Centrilobular emphysema: Secondary | ICD-10-CM | POA: Diagnosis not present

## 2019-07-24 DIAGNOSIS — I10 Essential (primary) hypertension: Secondary | ICD-10-CM | POA: Diagnosis not present

## 2019-07-24 DIAGNOSIS — J479 Bronchiectasis, uncomplicated: Secondary | ICD-10-CM | POA: Diagnosis not present

## 2019-07-24 DIAGNOSIS — I25118 Atherosclerotic heart disease of native coronary artery with other forms of angina pectoris: Secondary | ICD-10-CM | POA: Diagnosis not present

## 2019-07-31 DIAGNOSIS — J432 Centrilobular emphysema: Secondary | ICD-10-CM | POA: Diagnosis not present

## 2019-07-31 DIAGNOSIS — E8801 Alpha-1-antitrypsin deficiency: Secondary | ICD-10-CM | POA: Diagnosis not present

## 2019-07-31 DIAGNOSIS — M80071D Age-related osteoporosis with current pathological fracture, right ankle and foot, subsequent encounter for fracture with routine healing: Secondary | ICD-10-CM | POA: Diagnosis not present

## 2019-07-31 DIAGNOSIS — D509 Iron deficiency anemia, unspecified: Secondary | ICD-10-CM | POA: Diagnosis not present

## 2019-07-31 DIAGNOSIS — M797 Fibromyalgia: Secondary | ICD-10-CM | POA: Diagnosis not present

## 2019-07-31 DIAGNOSIS — J479 Bronchiectasis, uncomplicated: Secondary | ICD-10-CM | POA: Diagnosis not present

## 2019-07-31 DIAGNOSIS — I25118 Atherosclerotic heart disease of native coronary artery with other forms of angina pectoris: Secondary | ICD-10-CM | POA: Diagnosis not present

## 2019-07-31 DIAGNOSIS — K769 Liver disease, unspecified: Secondary | ICD-10-CM | POA: Diagnosis not present

## 2019-07-31 DIAGNOSIS — M150 Primary generalized (osteo)arthritis: Secondary | ICD-10-CM | POA: Diagnosis not present

## 2019-07-31 DIAGNOSIS — I10 Essential (primary) hypertension: Secondary | ICD-10-CM | POA: Diagnosis not present

## 2019-08-21 DIAGNOSIS — Z791 Long term (current) use of non-steroidal anti-inflammatories (NSAID): Secondary | ICD-10-CM | POA: Diagnosis not present

## 2019-08-21 DIAGNOSIS — M810 Age-related osteoporosis without current pathological fracture: Secondary | ICD-10-CM | POA: Diagnosis not present

## 2019-08-21 DIAGNOSIS — E785 Hyperlipidemia, unspecified: Secondary | ICD-10-CM | POA: Diagnosis not present

## 2019-08-21 DIAGNOSIS — Z85828 Personal history of other malignant neoplasm of skin: Secondary | ICD-10-CM | POA: Diagnosis not present

## 2019-08-21 DIAGNOSIS — I25119 Atherosclerotic heart disease of native coronary artery with unspecified angina pectoris: Secondary | ICD-10-CM | POA: Diagnosis not present

## 2019-08-21 DIAGNOSIS — G8929 Other chronic pain: Secondary | ICD-10-CM | POA: Diagnosis not present

## 2019-08-21 DIAGNOSIS — Z7983 Long term (current) use of bisphosphonates: Secondary | ICD-10-CM | POA: Diagnosis not present

## 2019-08-21 DIAGNOSIS — M1990 Unspecified osteoarthritis, unspecified site: Secondary | ICD-10-CM | POA: Diagnosis not present

## 2019-08-21 DIAGNOSIS — I10 Essential (primary) hypertension: Secondary | ICD-10-CM | POA: Diagnosis not present

## 2019-08-21 DIAGNOSIS — Z7902 Long term (current) use of antithrombotics/antiplatelets: Secondary | ICD-10-CM | POA: Diagnosis not present

## 2019-08-22 ENCOUNTER — Emergency Department
Admission: EM | Admit: 2019-08-22 | Discharge: 2019-08-22 | Disposition: A | Discharge status: Home / Self Care | Payer: Medicare HMO | Attending: Emergency Medicine | Admitting: Emergency Medicine

## 2019-08-22 ENCOUNTER — Emergency Department: Payer: Medicare HMO

## 2019-08-22 ENCOUNTER — Other Ambulatory Visit: Payer: Self-pay

## 2019-08-22 DIAGNOSIS — I10 Essential (primary) hypertension: Secondary | ICD-10-CM | POA: Insufficient documentation

## 2019-08-22 DIAGNOSIS — J3489 Other specified disorders of nose and nasal sinuses: Secondary | ICD-10-CM | POA: Diagnosis not present

## 2019-08-22 DIAGNOSIS — I2510 Atherosclerotic heart disease of native coronary artery without angina pectoris: Secondary | ICD-10-CM | POA: Insufficient documentation

## 2019-08-22 DIAGNOSIS — I959 Hypotension, unspecified: Secondary | ICD-10-CM | POA: Diagnosis not present

## 2019-08-22 DIAGNOSIS — E039 Hypothyroidism, unspecified: Secondary | ICD-10-CM | POA: Insufficient documentation

## 2019-08-22 DIAGNOSIS — R55 Syncope and collapse: Secondary | ICD-10-CM | POA: Insufficient documentation

## 2019-08-22 DIAGNOSIS — N390 Urinary tract infection, site not specified: Secondary | ICD-10-CM | POA: Diagnosis not present

## 2019-08-22 DIAGNOSIS — I6389 Other cerebral infarction: Secondary | ICD-10-CM | POA: Diagnosis not present

## 2019-08-22 DIAGNOSIS — Z79899 Other long term (current) drug therapy: Secondary | ICD-10-CM | POA: Insufficient documentation

## 2019-08-22 DIAGNOSIS — S0990XA Unspecified injury of head, initial encounter: Secondary | ICD-10-CM | POA: Diagnosis not present

## 2019-08-22 DIAGNOSIS — Z7901 Long term (current) use of anticoagulants: Secondary | ICD-10-CM | POA: Diagnosis not present

## 2019-08-22 DIAGNOSIS — J449 Chronic obstructive pulmonary disease, unspecified: Secondary | ICD-10-CM | POA: Insufficient documentation

## 2019-08-22 DIAGNOSIS — G319 Degenerative disease of nervous system, unspecified: Secondary | ICD-10-CM | POA: Diagnosis not present

## 2019-08-22 LAB — URINALYSIS, COMPLETE (UACMP) WITH MICROSCOPIC
Bilirubin Urine: NEGATIVE
Glucose, UA: NEGATIVE mg/dL
Ketones, ur: NEGATIVE mg/dL
Nitrite: POSITIVE — AB
Protein, ur: 100 mg/dL — AB
Specific Gravity, Urine: 1.011 (ref 1.005–1.030)
WBC, UA: >50 WBC/hpf — ABNORMAL HIGH (ref 0–5)
pH: 5 (ref 5.0–8.0)

## 2019-08-22 LAB — CBC
HCT: 29.7 % — ABNORMAL LOW (ref 36.0–46.0)
Hemoglobin: 10 g/dL — ABNORMAL LOW (ref 12.0–15.0)
MCH: 30.9 pg (ref 26.0–34.0)
MCHC: 33.7 g/dL (ref 30.0–36.0)
MCV: 91.7 fL (ref 80.0–100.0)
Platelets: 186 10*3/uL (ref 150–400)
RBC: 3.24 MIL/uL — ABNORMAL LOW (ref 3.87–5.11)
RDW: 16.2 % — ABNORMAL HIGH (ref 11.5–15.5)
WBC: 5.9 10*3/uL (ref 4.0–10.5)
nRBC: 0 % (ref 0.0–0.2)

## 2019-08-22 LAB — TROPONIN I (HIGH SENSITIVITY): Troponin I (High Sensitivity): 7 ng/L (ref <18)

## 2019-08-22 LAB — BASIC METABOLIC PANEL
Anion gap: 7 (ref 5–15)
BUN: 27 mg/dL — ABNORMAL HIGH (ref 8–23)
CO2: 28 mmol/L (ref 22–32)
Calcium: 9.1 mg/dL (ref 8.9–10.3)
Chloride: 104 mmol/L (ref 98–111)
Creatinine, Ser: 1.13 mg/dL — ABNORMAL HIGH (ref 0.44–1.00)
GFR calc Af Amer: 51 mL/min — ABNORMAL LOW (ref >60)
GFR calc non Af Amer: 44 mL/min — ABNORMAL LOW (ref >60)
Glucose, Bld: 126 mg/dL — ABNORMAL HIGH (ref 70–99)
Potassium: 4.2 mmol/L (ref 3.5–5.1)
Sodium: 139 mmol/L (ref 135–145)

## 2019-08-22 MED ORDER — CEPHALEXIN 500 MG PO CAPS
500.0000 mg | ORAL_CAPSULE | Freq: Two times a day (BID) | ORAL | 0 refills | Status: AC
Start: 2019-08-22 — End: 2019-08-29

## 2019-08-22 MED ORDER — CEPHALEXIN 500 MG PO CAPS
500.0000 mg | ORAL_CAPSULE | Freq: Two times a day (BID) | ORAL | 0 refills | Status: DC
Start: 2019-08-22 — End: 2019-08-22

## 2019-08-22 MED ORDER — CEPHALEXIN 500 MG PO CAPS
500.0000 mg | ORAL_CAPSULE | Freq: Once | ORAL | Status: AC
  Administered 2019-08-22: 500 mg via ORAL
  Filled 2019-08-22: qty 1

## 2019-08-22 NOTE — ED Notes (Signed)
Pt assisted to toilet in room at this time 

## 2019-08-22 NOTE — ED Notes (Addendum)
Lab contacted to complete troponin at this time using blood work collected at [employer]

## 2019-08-22 NOTE — ED Provider Notes (Signed)
[facility] Emergency Department Provider Note ____________________________________________   First MD Initiated Contact with Patient 08/22/19 1035     (approximate)  I have reviewed the triage vital signs and the nursing notes.   HISTORY  Chief Complaint Loss of Consciousness and Fall    HPI Leanah Kolander is a 86 y.o. female with PMH as noted below who presents with an episode of syncope, acute onset while she was in her kitchen preparing her breakfast.  The patient states that she suddenly became diaphoretic, then lightheaded, and then lost consciousness.  She is not sure if she fell, but denies any headache or other acute pain.  She states she feels a bit better now.  She has had a couple of other episodes of feeling sweaty, but without passing out.  She denies any vomiting or diarrhea, chest pain or shortness of breath, palpitations, fever, or urinary symptoms.  Past Medical History:  Diagnosis Date  . Anemia   . ASCVD (arteriosclerotic cardiovascular disease)   . COPD (chronic obstructive pulmonary disease) ([place])   . Diverticulosis   . GERD (gastroesophageal reflux disease)   . Hypertension   . Hypothyroidism     Patient Active Problem List   Diagnosis Date Noted  . Polyp of ascending colon 02/16/2018  . Diverticula, colon 02/16/2018  . Primary hypothyroidism 02/16/2018  . Essential hypertension, benign 02/16/2018  . ASCVD (arteriosclerotic cardiovascular disease) 02/16/2018  . Periumbilical abdominal pain 02/16/2018  . GI bleed 02/16/2018  . Iron deficiency anemia, unspecified 02/16/2018  . Chronic liver disease 02/16/2018  . Carrier of alpha-1-antitrypsin deficiency 02/16/2018  . Personal history of colonic polyps 02/16/2018  . Abnormal liver CT 02/16/2018  . Elevated liver enzymes 02/16/2018  . History of hepatitis C 02/16/2018  . Abnormal findings on imaging test 02/16/2018  . Helicobacter pylori gastritis 02/16/2018  . Gait  instability 02/13/2018  . Fall 02/13/2018  . Mixed hyperlipidemia 02/13/2018  . Atherosclerosis of native coronary artery of native heart with stable angina pectoris ([place]) 02/12/2018  . Centrilobular emphysema ([place]) 02/12/2018  . Anemia 02/12/2018  . Benign neoplasm of cecum 01/16/2018  . Heterozygous alpha 1-antitrypsin deficiency ([place]) 01/16/2018  . Gastroesophageal reflux disease 01/16/2018  . Bronchiectasis without complication ([place]) 19/14/7829  . Moderate episode of recurrent major depressive disorder ([place]) 04/14/2016  . Bloody stool 09/15/2015  . Left lower quadrant pain 09/15/2015  . Long term (current) use of antithrombotics/antiplatelets 09/15/2015  . Dyspnea 05/18/2015  . Anxiety 11/14/2011  . DJD (degenerative joint disease) 11/14/2011  . Abnormal stress test 04/20/2010  . Dyslipidemia 04/20/2010  . HTN (hypertension) 04/20/2010  . Status post coronary artery stent placement 04/20/2010  . CAD (coronary artery disease) 04/20/2010    Past Surgical History:  Procedure Laterality Date  . APPENDECTOMY    . BREAST SURGERY  1983   Lumpectomy  . CHOLECYSTECTOMY    . COLON SURGERY    . COLONOSCOPY  11/04/2015  . CORONARY ANGIOPLASTY WITH STENT PLACEMENT    . NISSEN FUNDOPLICATION    . TOTAL ABDOMINAL HYSTERECTOMY     With BSO    Prior to Admission medications   Medication Sig Start Date End Date Taking? Authorizing Provider  acetaminophen (TYLENOL) 500 MG tablet Take 500 mg by mouth every 4 (four) hours as needed.    [provider]  alendronate (FOSAMAX) 70 MG tablet alendronate 70 mg tablet  TAKE 1 TABLET BY MOUTH ONCE WEEKLY    [provider]  celecoxib (CELEBREX) 100 MG capsule  TAKE 1 CAPSULE BY MOUTH EVERY DAY 02/16/19   Jerrol Banana., MD  cephALEXin (KEFLEX) 500 MG capsule Take 1 capsule (500 mg total) by mouth 2 (two) times daily for 7 days. 08/22/19 08/29/19  Arta Silence, MD  clopidogrel (PLAVIX) 75 MG tablet TAKE 1 TABLET BY  MOUTH EVERY DAY 04/23/19   Minna Merritts, MD  DULoxetine (CYMBALTA) 60 MG capsule TAKE 1 CAPSULE BY MOUTH EVERY DAY 06/22/19   Jerrol Banana., MD  esomeprazole (NEXIUM) 20 MG capsule Take 20 mg by mouth daily at 12 noon.    [provider]  isosorbide mononitrate (IMDUR) 30 MG 24 hr tablet Take 1 tablet (30 mg total) by mouth daily. 02/13/18   Minna Merritts, MD  losartan (COZAAR) 100 MG tablet TAKE 1 TABLET BY MOUTH EVERY DAY 07/02/19   Jerrol Banana., MD  nitroGLYCERIN (NITROSTAT) 0.4 MG SL tablet Place 1 tablet (0.4 mg total) under the tongue every 5 (five) minutes as needed for chest pain. 04/05/19   Minna Merritts, MD  rosuvastatin (CRESTOR) 10 MG tablet TAKE 1 TABLET BY MOUTH EVERY DAY 07/02/19   Minna Merritts, MD  umeclidinium-vilanterol (ANORO ELLIPTA) 62.5-25 MCG/INH AEPB Inhale 1 puff into the lungs daily. 03/28/18   Wilhelmina Mcardle, MD    Allergies Shellfish allergy  Family History  Problem Relation Age of Onset  . Coronary artery disease Mother   . Heart disease Mother   . Crohn's disease Son   . Cancer Other        other family member with breast cancer    Social History Social History   Tobacco Use  . Smoking status: Never Smoker  . Smokeless tobacco: Never Used  Vaping Use  . Vaping Use: Never used  Substance Use Topics  . Alcohol use: Never  . Drug use: Never    Review of Systems  Constitutional: No fever/chills. Eyes: No visual changes. ENT: No sore throat. Cardiovascular: Denies chest pain. Respiratory: Denies shortness of breath. Gastrointestinal: No vomiting or diarrhea.  Genitourinary: Negative for dysuria.  Musculoskeletal: Negative for back pain. Skin: Negative for rash. Neurological: Negative for headache.   ____________________________________________   PHYSICAL EXAM:  VITAL SIGNS: ED Triage Vitals  Enc Vitals Group     BP 08/22/19 1025 (!) 114/45     Pulse Rate 08/22/19 1025 72     Resp 08/22/19 1025 20      Temp 08/22/19 1025 98.2 F (36.8 C)     Temp Source 08/22/19 1025 Oral     SpO2 08/22/19 1025 97 %     Weight 08/22/19 1022 160 lb (72.6 kg)     Height 08/22/19 1022 5\' 5"  (1.651 m)     Head Circumference --      Peak Flow --      Pain Score 08/22/19 1021 7     Pain Loc --      Pain Edu? --      Excl. in [place]? --     Constitutional: Alert and oriented. Well appearing and in no acute distress. Eyes: Conjunctivae are normal.  EOMI.  PERRLA. Head: Atraumatic. Nose: No congestion/rhinnorhea. Mouth/Throat: Mucous membranes are moist.   Neck: Normal range of motion.  No midline cervical spinal tenderness. Cardiovascular: Normal rate, regular rhythm. Grossly normal heart sounds.  Good peripheral circulation. Respiratory: Normal respiratory effort.  No retractions. Lungs CTAB. Gastrointestinal: Soft and nontender. No distention.  Genitourinary: No flank tenderness. Musculoskeletal: No lower extremity edema.  Extremities warm and well perfused.  Neurologic:  Normal speech and language.  Motor and sensory intact in all extremities.  Normal coordination.   Skin:  Skin is warm and dry. No rash noted. Psychiatric: Mood and affect are normal. Speech and behavior are normal.  ____________________________________________   LABS (all labs ordered are listed, but only abnormal results are displayed)  Labs Reviewed  BASIC METABOLIC PANEL - Abnormal; Notable for the following components:      Result Value   Glucose, Bld 126 (*)    BUN 27 (*)    Creatinine, Ser 1.13 (*)    GFR calc non Af Amer 44 (*)    GFR calc Af Amer 51 (*)    All other components within normal limits  CBC - Abnormal; Notable for the following components:   RBC 3.24 (*)    Hemoglobin 10.0 (*)    HCT 29.7 (*)    RDW 16.2 (*)    All other components within normal limits  URINALYSIS, COMPLETE (UACMP) WITH MICROSCOPIC - Abnormal; Notable for the following components:   Color, Urine YELLOW (*)    APPearance TURBID (*)     Hgb urine dipstick MODERATE (*)    Protein, ur 100 (*)    Nitrite POSITIVE (*)    Leukocytes,Ua LARGE (*)    WBC, UA >50 (*)    Bacteria, UA FEW (*)    All other components within normal limits  CBG MONITORING, ED  TROPONIN I (HIGH SENSITIVITY)  TROPONIN I (HIGH SENSITIVITY)   ____________________________________________  EKG  ED ECG REPORT I, Arta Silence, the attending physician, personally viewed and interpreted this ECG.  Date: 08/22/2019 EKG Time: 1024 Rate: 72 Rhythm: normal sinus rhythm QRS Axis: normal Intervals: normal ST/T Wave abnormalities: Nonspecific ST abnormalities laterally Narrative Interpretation: Nonspecific abnormalities with no evidence of acute ischemia  ____________________________________________  RADIOLOGY  CT head: No ICH or other acute findings  ____________________________________________   PROCEDURES  Procedure(s) performed: No  Procedures  Critical Care performed: No ____________________________________________   INITIAL IMPRESSION / ASSESSMENT AND PLAN / ED COURSE  Pertinent labs & imaging results that were available during my care of the patient were reviewed by me and considered in my medical decision making (see chart for details).  86 year old female with PMH as noted above presents with an episode of syncope with a prodrome of diaphoresis and lightheadedness.  She had not yet eaten this morning.  She has had a few other episodes of feeling sweaty in the last few days.  I reviewed the past medical records in [place].  The patient was most recently seen in the ED in early 2020 for leg pain.  On exam, she is well-appearing for her age.  Her vital signs are normal.  The physical exam is unremarkable.  Neurologic exam is nonfocal.  She has no visible head trauma no midline spinal tenderness.  Overall presentation is most consistent with vasovagal near syncope.  I have low suspicion for cardiac etiology, the patient's EKG  shows no acute findings.  Given her age and since she is on aspirin and Plavix even though it is not certain that she hit her head, it would be prudent to obtain a CT to rule out any acute trauma.  We will obtain basic labs, troponin, and urinalysis.  ----------------------------------------- 3:02 PM on 08/22/2019 -----------------------------------------  CT head shows no acute findings.  Lab work-up is consistent with the patient's baseline, and her troponin is negative.  However, urinalysis shows findings consistent  with UTI.  Although the patient has no urinary symptoms, given that the UA is nitrite positive with significant WBCs, it warrants treatment.  This could also explain the patient's episodes of diaphoresis.  I had an extensive discussion with the patient and her daughter about the results of the work-up.  The patient feels comfortable and would like to go home.  Given her stable vital signs, the fact she is tolerating p.o., and overall reassuring work-up I think outpatient treatment with an oral antibiotic is appropriate.  I give a dose of Keflex in the ED, and will prescribe same for home.  Return precautions given, and the patient and daughter expressed understanding.  ____________________________________________   FINAL CLINICAL IMPRESSION(S) / ED DIAGNOSES  Final diagnoses:  Syncope, unspecified syncope type  Urinary tract infection without hematuria, site unspecified      NEW MEDICATIONS STARTED DURING THIS VISIT:  Discharge Medication List as of 08/22/2019  2:00 PM       Note:  This document was prepared using Dragon voice recognition software and may include unintentional dictation errors.    Arta Silence, MD 08/22/19 [phone]

## 2019-08-22 NOTE — Discharge Instructions (Signed)
Take the antibiotic as prescribed and finish the full 7-day course.  Make sure to drink plenty of fluids.  Take your other medications as prescribed.  Return to the ER immediately for new, worsening, or recurrent dizziness or lightheadedness, excessive sweating, passing out or feel like you are going to pass out, weakness, vomiting, inability to take the medication, or any other new or worsening symptoms that concern you.  Follow-up with your primary care doctor within the next 1 to 2 weeks.

## 2019-08-22 NOTE — ED Triage Notes (Signed)
Pt arrives via ems from [facility]. EMS reports pt had syncopal episode when standing and fell. EMS reports history of same. Pt denies any new pain from fall, only c/o her "normal" arthritis pain. No obvious injuries. Pt reports taking blood thinner  Pt a&o x 4 on arrival. NAD noted at this time.

## 2019-08-29 NOTE — Progress Notes (Signed)
Annual Wellness Visit     Patient: Terri Braun, Female    DOB: 02/18/1929, 86 y.o.   MRN: [id number] Visit Date: 09/02/2019  Today's Provider: [name], MD   No chief complaint on file.  Subjective    Terri Braun is a 86 y.o. female who presents today for her Annual Wellness Visit.  Annual physical. She reports consuming a general diet. She is not exercising.. She generally feels well. She reports sleeping well. She does have additional problems to discuss today.   Patient had some labs done while at the ER in July after she had a syncopal episode with loss of consciousness after the fall She reports that she was treated for a UTI and would like another culture checked today.  Her sister, her daughter, her son have all had breast cancer. Medications: Outpatient Medications Prior to Visit  Medication Sig  . acetaminophen (TYLENOL) 500 MG tablet Take 500 mg by mouth every 4 (four) hours as needed.  Marland Kitchen alendronate (FOSAMAX) 70 MG tablet alendronate 70 mg tablet  TAKE 1 TABLET BY MOUTH ONCE WEEKLY  . celecoxib (CELEBREX) 100 MG capsule TAKE 1 CAPSULE BY MOUTH EVERY DAY  . clopidogrel (PLAVIX) 75 MG tablet TAKE 1 TABLET BY MOUTH EVERY DAY  . DULoxetine (CYMBALTA) 60 MG capsule TAKE 1 CAPSULE BY MOUTH EVERY DAY  . esomeprazole (NEXIUM) 20 MG capsule Take 20 mg by mouth daily at 12 noon.  . isosorbide mononitrate (IMDUR) 30 MG 24 hr tablet Take 1 tablet (30 mg total) by mouth daily.  Marland Kitchen losartan (COZAAR) 100 MG tablet TAKE 1 TABLET BY MOUTH EVERY DAY  . nitroGLYCERIN (NITROSTAT) 0.4 MG SL tablet Place 1 tablet (0.4 mg total) under the tongue every 5 (five) minutes as needed for chest pain.  . rosuvastatin (CRESTOR) 10 MG tablet TAKE 1 TABLET BY MOUTH EVERY DAY  . umeclidinium-vilanterol (ANORO ELLIPTA) 62.5-25 MCG/INH AEPB Inhale 1 puff into the lungs daily.   No facility-administered medications prior to visit.    Allergies  Allergen Reactions  . Shellfish Allergy  Anaphylaxis    Ended up in the ED after eating shellfish, caused diarrhea and severe GI upset , unsure if caused SOB    Patient Care Team: Jerrol Banana., MD as PCP - General (Family Medicine)  Review of Systems  Constitutional: Positive for activity change, appetite change and fatigue.  HENT: Positive for dental problem.   Eyes: Negative.   Respiratory: Positive for shortness of breath.   Cardiovascular: Negative.   Gastrointestinal: Negative.   Endocrine: Negative.   Genitourinary: Positive for frequency.  Musculoskeletal: Positive for arthralgias.       Chronic left knee pain.  Skin: Negative.   Allergic/Immunologic: Negative.   Neurological: Positive for dizziness, syncope and light-headedness.  Hematological: Negative.   Psychiatric/Behavioral: Negative.    Patient reports that these are either chronic issues and unchanged or symptoms that have been previously addressed    Objective    Vitals: BP (!) 124/58 (BP Location: Right Arm, Patient Position: Sitting, Cuff Size: Normal)   Pulse 66   Temp 98.7 F (37.1 C) (Oral)   Wt 160 lb (72.6 kg)   SpO2 98%   BMI 26.63 kg/m     Physical Exam Constitutional:      Appearance: Normal appearance. She is normal weight.  HENT:     Head: Normocephalic and atraumatic.     Right Ear: Tympanic membrane, ear canal and external ear normal.  Left Ear: Tympanic membrane, ear canal and external ear normal.     Nose: Nose normal.     Mouth/Throat:     Mouth: Mucous membranes are moist.     Pharynx: Oropharynx is clear.  Eyes:     Extraocular Movements: Extraocular movements intact.     Conjunctiva/sclera: Conjunctivae normal.     Pupils: Pupils are equal, round, and reactive to light.  Cardiovascular:     Rate and Rhythm: Normal rate and regular rhythm.     Pulses: Normal pulses.     Heart sounds: Normal heart sounds.  Pulmonary:     Effort: Pulmonary effort is normal.     Breath sounds: Normal breath sounds.    Chest:     Breasts:        Right: Normal.        Left: Normal.  Abdominal:     Palpations: Abdomen is soft.     Tenderness: There is no abdominal tenderness.  Musculoskeletal:        General: Normal range of motion.     Cervical back: Normal range of motion and neck supple.  Lymphadenopathy:     Upper Body:     Right upper body: No supraclavicular or axillary adenopathy.     Left upper body: No supraclavicular or axillary adenopathy.  Skin:    General: Skin is warm and dry.  Neurological:     General: No focal deficit present.     Mental Status: She is alert and oriented to person, place, and time. Mental status is at baseline.  Psychiatric:        Mood and Affect: Mood normal.        Behavior: Behavior normal.        Thought Content: Thought content normal.        Judgment: Judgment normal.      Most recent functional status assessment: No flowsheet data found. Most recent fall risk assessment: Fall Risk  09/02/2019  Falls in the past year? 1  Number falls in past yr: 1  Injury with Fall? 0    Most recent depression screenings: PHQ 2/9 Scores 09/02/2019  PHQ - 2 Score 1  PHQ- 9 Score 3   Most recent cognitive screening: No flowsheet data found. Most recent Audit-C alcohol use screening Alcohol Use Disorder Test (AUDIT) 09/02/2019  1. How often do you have a drink containing alcohol? 0  2. How many drinks containing alcohol do you have on a typical day when you are drinking? 0  3. How often do you have six or more drinks on one occasion? 0  AUDIT-C Score 0  Alcohol Brief Interventions/Follow-up AUDIT Score <7 follow-up not indicated   A score of 3 or more in women, and 4 or more in men indicates increased risk for alcohol abuse, EXCEPT if all of the points are from question 1   No results found for any visits on 09/02/19.  Assessment & Plan     Annual wellness visit done today including the all of the following: Reviewed patient's Family Medical History Reviewed  and updated list of patient's medical providers Assessment of cognitive impairment was done Assessed patient's functional ability Established a written schedule for health screening [place] Completed and Reviewed  Exercise Activities and Dietary recommendations Goals   None     Immunization History  Administered Date(s) Administered  . Fluad Quad(high Dose 65+) 10/24/2018  . Influenza Split 11/01/2013  . Influenza, High Dose Seasonal PF 11/03/2017  .  Influenza,inj,Quad PF,6+ Mos 12/05/2014  . Influenza-Unspecified 10/14/2015, 10/28/2016, 10/31/2016  . Pneumococcal Conjugate-13 07/14/2014  . Pneumococcal Polysaccharide-23 07/29/2015    Health Maintenance  Topic Date Due  . COVID-19 Vaccine (1) Never done  . TETANUS/TDAP  Never done  . DEXA SCAN  Never done  . INFLUENZA VACCINE  09/01/2019  . PNA vac Low Risk Adult  Completed     Discussed health benefits of physical activity, and encouraged her to engage in regular exercise appropriate for her age and condition.    1. Annual physical exam Breast exam is normal.  Pelvic and rectal exam are refused.  2. Essential hypertension, benign  - TSH  3. History of hepatitis C  - Hepatic function panel  4. Pain in both knees, unspecified chronicity End-stage osteoarthritis followed by Dr. Sabra Heck.  Try topical treatment.  She gets an injection at about a month. - lidocaine (LIDODERM) 5 %; Place 1 patch onto the skin daily. Remove & Discard patch within 12 hours or as directed by MD  Dispense: 30 patch; Refill: 0  5. Atherosclerosis of native coronary artery of native heart with stable angina pectoris ([place]) Followed by cardiology.  6. Carrier of alpha-1-antitrypsin deficiency   7. Centrilobular emphysema ([place])   8. Moderate episode of recurrent major depressive disorder ([place])  9.  UTI Check urine culture today.  The patient was not having urinary symptoms when she went to the ED last month but we  will check a culture today.  May be normal colonization. 10.  Vasovagal syncope Patient describes several episodes in her life where she gets cold sweats and then passes out.  This happened recently and I think it was a vasovagal.  I told her to mention this to cardiology but I think she needs no further work-up. No follow-ups on file.         Walker Paddack Cranford Mon, MD  [facility] [phone] (phone) [phone] (fax)  [place]

## 2019-09-02 ENCOUNTER — Other Ambulatory Visit: Payer: Self-pay

## 2019-09-02 ENCOUNTER — Ambulatory Visit: Payer: Medicare HMO | Admitting: Family Medicine

## 2019-09-02 ENCOUNTER — Ambulatory Visit (INDEPENDENT_AMBULATORY_CARE_PROVIDER_SITE_OTHER): Payer: Medicare HMO | Admitting: Family Medicine

## 2019-09-02 VITALS — BP 124/58 | HR 66 | Temp 98.7°F | Wt 160.0 lb

## 2019-09-02 DIAGNOSIS — R55 Syncope and collapse: Secondary | ICD-10-CM | POA: Diagnosis not present

## 2019-09-02 DIAGNOSIS — M25561 Pain in right knee: Secondary | ICD-10-CM | POA: Diagnosis not present

## 2019-09-02 DIAGNOSIS — Z148 Genetic carrier of other disease: Secondary | ICD-10-CM | POA: Diagnosis not present

## 2019-09-02 DIAGNOSIS — Z8619 Personal history of other infectious and parasitic diseases: Secondary | ICD-10-CM | POA: Diagnosis not present

## 2019-09-02 DIAGNOSIS — I25118 Atherosclerotic heart disease of native coronary artery with other forms of angina pectoris: Secondary | ICD-10-CM

## 2019-09-02 DIAGNOSIS — J432 Centrilobular emphysema: Secondary | ICD-10-CM

## 2019-09-02 DIAGNOSIS — Z0000 Encounter for general adult medical examination without abnormal findings: Secondary | ICD-10-CM

## 2019-09-02 DIAGNOSIS — F331 Major depressive disorder, recurrent, moderate: Secondary | ICD-10-CM

## 2019-09-02 DIAGNOSIS — M25562 Pain in left knee: Secondary | ICD-10-CM

## 2019-09-02 DIAGNOSIS — I10 Essential (primary) hypertension: Secondary | ICD-10-CM

## 2019-09-02 DIAGNOSIS — N390 Urinary tract infection, site not specified: Secondary | ICD-10-CM

## 2019-09-02 DIAGNOSIS — R69 Illness, unspecified: Secondary | ICD-10-CM | POA: Diagnosis not present

## 2019-09-02 MED ORDER — LIDOCAINE 5 % EX PTCH: 1.0000 | MEDICATED_PATCH | CUTANEOUS | 0 refills | Status: DC

## 2019-09-03 ENCOUNTER — Telehealth: Payer: Self-pay | Admitting: Family Medicine

## 2019-09-03 LAB — HEPATIC FUNCTION PANEL
ALT: 15 IU/L (ref 0–32)
AST: 25 IU/L (ref 0–40)
Albumin: 4 g/dL (ref 3.6–4.6)
Alkaline Phosphatase: 111 IU/L (ref 48–121)
Bilirubin Total: 0.2 mg/dL (ref 0.0–1.2)
Bilirubin, Direct: 0.11 mg/dL (ref 0.00–0.40)
Total Protein: 6.8 g/dL (ref 6.0–8.5)

## 2019-09-03 LAB — TSH: TSH: 3.09 u[IU]/mL (ref 0.450–4.500)

## 2019-09-03 NOTE — Telephone Encounter (Signed)
Pts daughter called in needing Terri Braun to call insurance company to approve medication listed below for approval . Please advise     lidocaine (LIDODERM) 5 % [[id number]]

## 2019-09-03 NOTE — Telephone Encounter (Signed)
Talked with daughter and she is going to have her mother try the other patches we told her about ([place]).  If she does not get relief we will try to get the Lidoderm patch approved.

## 2019-09-04 ENCOUNTER — Telehealth: Payer: Self-pay

## 2019-09-04 NOTE — Telephone Encounter (Signed)
Patient called, left VM to return the call to the office for lab results.  ?

## 2019-09-04 NOTE — Telephone Encounter (Signed)
-----   Message from Jerrol Banana., MD sent at 09/04/2019  7:57 AM EDT ----- Labs in normal range.

## 2019-09-04 NOTE — Telephone Encounter (Signed)
LMTCB, P[facility] Triage Nurse may give patient results  

## 2019-09-05 NOTE — Telephone Encounter (Signed)
Patients daughter Wilfred Curtis ( on [place]) called and was read lab note by Dr Rosanna Randy sent 09/04/19.  She verbalized understanding.

## 2019-09-23 ENCOUNTER — Other Ambulatory Visit: Payer: Self-pay | Admitting: Cardiovascular Disease

## 2019-10-01 DIAGNOSIS — M1712 Unilateral primary osteoarthritis, left knee: Secondary | ICD-10-CM | POA: Diagnosis not present

## 2019-10-14 DIAGNOSIS — R69 Illness, unspecified: Secondary | ICD-10-CM | POA: Diagnosis not present

## 2019-12-03 ENCOUNTER — Ambulatory Visit: Payer: Self-pay | Admitting: Family Medicine

## 2019-12-13 ENCOUNTER — Other Ambulatory Visit: Payer: Self-pay

## 2019-12-13 ENCOUNTER — Ambulatory Visit (INDEPENDENT_AMBULATORY_CARE_PROVIDER_SITE_OTHER): Payer: Medicare HMO | Admitting: Adult Health

## 2019-12-13 ENCOUNTER — Encounter: Payer: Self-pay | Admitting: Adult Health

## 2019-12-13 VITALS — BP 135/49 | HR 69 | Temp 98.2°F | Resp 15 | Wt 148.4 lb

## 2019-12-13 DIAGNOSIS — R350 Frequency of micturition: Secondary | ICD-10-CM | POA: Diagnosis not present

## 2019-12-13 DIAGNOSIS — R82998 Other abnormal findings in urine: Secondary | ICD-10-CM | POA: Diagnosis not present

## 2019-12-13 LAB — POCT URINALYSIS DIPSTICK
Blood, UA: NEGATIVE
Glucose, UA: NEGATIVE
Ketones, UA: NEGATIVE
Nitrite, UA: NEGATIVE
Protein, UA: NEGATIVE
Spec Grav, UA: 1.025 (ref 1.010–1.025)
Urobilinogen, UA: 0.2 E.U./dL
pH, UA: 5 (ref 5.0–8.0)

## 2019-12-13 MED ORDER — CEPHALEXIN 500 MG PO CAPS
500.0000 mg | ORAL_CAPSULE | Freq: Two times a day (BID) | ORAL | 0 refills | Status: DC
Start: 2019-12-13 — End: 2019-12-18

## 2019-12-13 NOTE — Patient Instructions (Signed)
Urinary Tract Infection, Adult A urinary tract infection (UTI) is an infection of any part of the urinary tract. The urinary tract includes:  The kidneys.  The ureters.  The bladder.  The urethra. These organs make, store, and get rid of pee (urine) in the body. What are the causes? This is caused by germs (bacteria) in your genital area. These germs grow and cause swelling (inflammation) of your urinary tract. What increases the risk? You are more likely to develop this condition if:  You have a small, thin tube (catheter) to drain pee.  You cannot control when you pee or poop (incontinence).  You are female, and: ? You use these methods to prevent pregnancy:  A medicine that kills sperm (spermicide).  A device that blocks sperm (diaphragm). ? You have low levels of a female hormone (estrogen). ? You are pregnant.  You have genes that add to your risk.  You are sexually active.  You take antibiotic medicines.  You have trouble peeing because of: ? A prostate that is bigger than normal, if you are female. ? A blockage in the part of your body that drains pee from the bladder (urethra). ? A kidney stone. ? A nerve condition that affects your bladder (neurogenic bladder). ? Not getting enough to drink. ? Not peeing often enough.  You have other conditions, such as: ? Diabetes. ? A weak disease-fighting system (immune system). ? Sickle cell disease. ? Gout. ? Injury of the spine. What are the signs or symptoms? Symptoms of this condition include:  Needing to pee right away (urgently).  Peeing often.  Peeing small amounts often.  Pain or burning when peeing.  Blood in the pee.  Pee that smells bad or not like normal.  Trouble peeing.  Pee that is cloudy.  Fluid coming from the vagina, if you are female.  Pain in the belly or lower back. Other symptoms include:  Throwing up (vomiting).  No urge to eat.  Feeling mixed up (confused).  Being tired  and grouchy (irritable).  A fever.  Watery poop (diarrhea). How is this treated? This condition may be treated with:  Antibiotic medicine.  Other medicines.  Drinking enough water. Follow these instructions at home:  Medicines  Take over-the-counter and prescription medicines only as told by your doctor.  If you were prescribed an antibiotic medicine, take it as told by your doctor. Do not stop taking it even if you start to feel better. General instructions  Make sure you: ? Pee until your bladder is empty. ? Do not hold pee for a long time. ? Empty your bladder after sex. ? Wipe from front to back after pooping if you are a female. Use each tissue one time when you wipe.  Drink enough fluid to keep your pee pale yellow.  Keep all follow-up visits as told by your doctor. This is important. Contact a doctor if:  You do not get better after 1-2 days.  Your symptoms go away and then come back. Get help right away if:  You have very bad back pain.  You have very bad pain in your lower belly.  You have a fever.  You are sick to your stomach (nauseous).  You are throwing up. Summary  A urinary tract infection (UTI) is an infection of any part of the urinary tract.  This condition is caused by germs in your genital area.  There are many risk factors for a UTI. These include having a small, thin  tube to drain pee and not being able to control when you pee or poop.  Treatment includes antibiotic medicines for germs.  Drink enough fluid to keep your pee pale yellow. This information is not intended to replace advice given to you by your health care provider. Make sure you discuss any questions you have with your health care provider. Document Revised: 01/04/2018 Document Reviewed: 07/27/2017 Elsevier Patient Education  [place]. Cephalexin Tablets or Capsules What is this medicine? CEPHALEXIN (sef a LEX in) is a cephalosporin antibiotic. It treats some  infections caused by bacteria. It will not work for colds, the flu, or other viruses. This medicine may be used for other purposes; ask your health care provider or pharmacist if you have questions. COMMON BRAND NAME(S): Biocef, Daxbia, Keflex, Keftab What should I tell my health care provider before I take this medicine? They need to know if you have any of these conditions:  kidney disease  stomach or intestine problems, especially colitis  an unusual or allergic reaction to cephalexin, other cephalosporins, penicillins, other antibiotics, medicines, foods, dyes or preservatives  pregnant or trying to get pregnant  breast-feeding How should I use this medicine? Take this drug by mouth. Take it as directed on the prescription label at the same time every day. You can take it with or without food. If it upsets your stomach, take it with food. Take all of this drug unless your health care provider tells you to stop it early. Keep taking it even if you think you are better. Talk to your health care provider about the use of this drug in children. While it may be prescribed for selected conditions, precautions do apply. Overdosage: If you think you have taken too much of this medicine contact a poison control center or emergency room at once. NOTE: This medicine is only for you. Do not share this medicine with others. What if I miss a dose? If you miss a dose, take it as soon as you can. If it is almost time for your next dose, take only that dose. Do not take double or extra doses. What may interact with this medicine?  probenecid  some other antibiotics This list may not describe all possible interactions. Give your health care provider a list of all the medicines, herbs, non-prescription drugs, or dietary supplements you use. Also tell them if you smoke, drink alcohol, or use illegal drugs. Some items may interact with your medicine. What should I watch for while using this  medicine? Tell your doctor or health care provider if your symptoms do not begin to improve in a few days. This medicine may cause serious skin reactions. They can happen weeks to months after starting the medicine. Contact your health care provider right away if you notice fevers or flu-like symptoms with a rash. The rash may be red or purple and then turn into blisters or peeling of the skin. Or, you might notice a red rash with swelling of the face, lips or lymph nodes in your neck or under your arms. Do not treat diarrhea with over the counter products. Contact your doctor if you have diarrhea that lasts more than 2 days or if it is severe and watery. If you have diabetes, you may get a false-positive result for sugar in your urine. Check with your doctor or health care provider. What side effects may I notice from receiving this medicine? Side effects that you should report to your doctor or health care professional  as soon as possible:  allergic reactions like skin rash, itching or hives, swelling of the face, lips, or tongue  breathing problems  pain or trouble passing urine  redness, blistering, peeling or loosening of the skin, including inside the mouth  severe or watery diarrhea  unusually weak or tired  yellowing of the eyes, skin Side effects that usually do not require medical attention (report to your doctor or health care professional if they continue or are bothersome):  gas or heartburn  genital or anal irritation  headache  joint or muscle pain  nausea, vomiting This list may not describe all possible side effects. Call your doctor for medical advice about side effects. You may report side effects to FDA at 1-800-FDA-1088. Where should I keep my medicine? Keep out of the reach of children and pets. Store at room temperature between 20 and 25 degrees C (68 and 77 degrees F). Throw away any unused drug after the expiration date. NOTE: This sheet is a summary. It  may not cover all possible information. If you have questions about this medicine, talk to your doctor, pharmacist, or health care provider.  2020 Elsevier/Gold Standard (2018-08-24 11:27:00)

## 2019-12-13 NOTE — Progress Notes (Addendum)
Established patient visit   Patient: Terri Braun   DOB: 27-Nov-1929   86 y.o. Female  MRN: [id number] Visit Date: 12/13/2019  Today's healthcare provider: Marcille Buffy, FNP   Chief Complaint  Patient presents with  . Urinary Frequency   Subjective    Urinary Frequency  The current episode started 1 to 4 weeks ago. The problem occurs every urination. The problem has been gradually worsening. The quality of the pain is described as burning. The pain is mild. There has been no fever. She is not sexually active. There is no history of pyelonephritis. Associated symptoms include frequency, nausea and urgency. Pertinent negatives include no chills, discharge, flank pain, hematuria, hesitancy, possible pregnancy, sweats or vomiting. She has tried nothing for the symptoms. Her past medical history is significant for recurrent UTIs.    Onset 1.5 weeks ago.   Patient  denies any fever, body aches,chills, rash, chest pain, shortness of breath, nausea, vomiting, or diarrhea.    Patient Active Problem List   Diagnosis Date Noted  . Leukocytes in urine 12/13/2019  . Urinary frequency 12/13/2019  . Closed fracture of metatarsal bone 12/25/2018  . Polyp of ascending colon 02/16/2018  . Diverticula, colon 02/16/2018  . Primary hypothyroidism 02/16/2018  . Essential hypertension, benign 02/16/2018  . ASCVD (arteriosclerotic cardiovascular disease) 02/16/2018  . Periumbilical abdominal pain 02/16/2018  . GI bleed 02/16/2018  . Iron deficiency anemia, unspecified 02/16/2018  . Chronic liver disease 02/16/2018  . Carrier of alpha-1-antitrypsin deficiency 02/16/2018  . Personal history of colonic polyps 02/16/2018  . Abnormal liver CT 02/16/2018  . Elevated liver enzymes 02/16/2018  . History of hepatitis C 02/16/2018  . Abnormal findings on imaging test 02/16/2018  . Helicobacter pylori gastritis 02/16/2018  . Gait instability 02/13/2018  . Fall 02/13/2018  . Mixed  hyperlipidemia 02/13/2018  . Atherosclerosis of native coronary artery of native heart with stable angina pectoris ([place]) 02/12/2018  . Centrilobular emphysema ([place]) 02/12/2018  . Anemia 02/12/2018  . Benign neoplasm of cecum 01/16/2018  . Heterozygous alpha 1-antitrypsin deficiency ([place]) 01/16/2018  . Gastroesophageal reflux disease 01/16/2018  . Bronchiectasis without complication ([place]) 56/38/7564  . Moderate episode of recurrent major depressive disorder ([place]) 04/14/2016  . Bloody stool 09/15/2015  . Left lower quadrant pain 09/15/2015  . Long term (current) use of antithrombotics/antiplatelets 09/15/2015  . Dyspnea 05/18/2015  . Anxiety 11/14/2011  . DJD (degenerative joint disease) 11/14/2011  . Abnormal stress test 04/20/2010  . Dyslipidemia 04/20/2010  . HTN (hypertension) 04/20/2010  . Status post coronary artery stent placement 04/20/2010  . CAD (coronary artery disease) 04/20/2010   Allergies  Allergen Reactions  . Shellfish Allergy Anaphylaxis    Ended up in the ED after eating shellfish, caused diarrhea and severe GI upset , unsure if caused SOB  . Sulfa Antibiotics        Medications: Outpatient Medications Prior to Visit  Medication Sig  . acetaminophen (TYLENOL) 500 MG tablet Take 500 mg by mouth every 4 (four) hours as needed.  Marland Kitchen alendronate (FOSAMAX) 70 MG tablet alendronate 70 mg tablet  TAKE 1 TABLET BY MOUTH ONCE WEEKLY  . celecoxib (CELEBREX) 100 MG capsule TAKE 1 CAPSULE BY MOUTH EVERY DAY  . clopidogrel (PLAVIX) 75 MG tablet TAKE 1 TABLET BY MOUTH EVERY DAY  . DULoxetine (CYMBALTA) 60 MG capsule TAKE 1 CAPSULE BY MOUTH EVERY DAY  . esomeprazole (NEXIUM) 20 MG capsule Take 20 mg by mouth daily at 12 noon.  . isosorbide mononitrate (  IMDUR) 30 MG 24 hr tablet Take 1 tablet (30 mg total) by mouth daily.  Marland Kitchen lidocaine (LIDODERM) 5 % Place 1 patch onto the skin daily. Remove & Discard patch within 12 hours or as directed by MD  . losartan (COZAAR) 100 MG tablet  TAKE 1 TABLET BY MOUTH EVERY DAY  . nitroGLYCERIN (NITROSTAT) 0.4 MG SL tablet Place 1 tablet (0.4 mg total) under the tongue every 5 (five) minutes as needed for chest pain.  . rosuvastatin (CRESTOR) 10 MG tablet TAKE 1 TABLET BY MOUTH EVERY DAY  . umeclidinium-vilanterol (ANORO ELLIPTA) 62.5-25 MCG/INH AEPB Inhale 1 puff into the lungs daily.   No facility-administered medications prior to visit.    Review of Systems  Constitutional: Negative for chills.  Gastrointestinal: Positive for nausea. Negative for vomiting.  Genitourinary: Positive for frequency and urgency. Negative for flank pain, hematuria and hesitancy.    Last CBC Lab Results  Component Value Date   WBC 5.9 08/22/2019   HGB 10.0 (L) 08/22/2019   HCT 29.7 (L) 08/22/2019   MCV 91.7 08/22/2019   MCH 30.9 08/22/2019   RDW 16.2 (H) 08/22/2019   PLT 186 92/42/6834   Last metabolic panel Lab Results  Component Value Date   GLUCOSE 126 (H) 08/22/2019   NA 139 08/22/2019   K 4.2 08/22/2019   CL 104 08/22/2019   CO2 28 08/22/2019   BUN 27 (H) 08/22/2019   CREATININE 1.13 (H) 08/22/2019   GFRNONAA 44 (L) 08/22/2019   GFRAA 51 (L) 08/22/2019   CALCIUM 9.1 08/22/2019   PROT 6.8 09/02/2019   ALBUMIN 4.0 09/02/2019   LABGLOB 2.5 12/25/2018   AGRATIO 1.6 12/25/2018   BILITOT 0.2 09/02/2019   ALKPHOS 111 09/02/2019   AST 25 09/02/2019   ALT 15 09/02/2019   ANIONGAP 7 08/22/2019      Objective    BP (!) 135/49   Pulse 69   Temp 98.2 F (36.8 C) (Oral)   Resp 15   Wt 148 lb 6.4 oz (67.3 kg)   SpO2 100%   BMI 24.70 kg/m  BP Readings from Last 3 Encounters:  12/13/19 (!) 135/49  09/02/19 (!) 124/58  08/22/19 (!) 138/53   Wt Readings from Last 3 Encounters:  12/13/19 148 lb 6.4 oz (67.3 kg)  09/02/19 160 lb (72.6 kg)  08/22/19 160 lb (72.6 kg)      Physical Exam Vitals reviewed.  Constitutional:      Appearance: Normal appearance.  HENT:     Head: Normocephalic and atraumatic.     Mouth/Throat:      Pharynx: Oropharynx is clear.  Eyes:     Conjunctiva/sclera: Conjunctivae normal.  Cardiovascular:     Rate and Rhythm: Normal rate and regular rhythm.     Pulses: Normal pulses.     Heart sounds: Normal heart sounds. No murmur heard.  No friction rub. No gallop.   Pulmonary:     Effort: Pulmonary effort is normal. No respiratory distress.     Breath sounds: Normal breath sounds. No stridor. No wheezing, rhonchi or rales.  Chest:     Chest wall: No tenderness.  Abdominal:     Palpations: Abdomen is soft.     Tenderness: There is abdominal tenderness in the suprapubic area. There is no right CVA tenderness, left CVA tenderness, guarding or rebound.  Musculoskeletal:        General: Normal range of motion.     Cervical back: Normal range of motion and neck supple.  Skin:  General: Skin is warm.  Neurological:     Mental Status: She is alert. Mental status is at baseline.     Motor: No weakness.     Gait: Gait normal.  Psychiatric:        Mood and Affect: Mood normal.        Behavior: Behavior normal.        Thought Content: Thought content normal.        Judgment: Judgment normal.      Results for orders placed or performed in visit on 12/13/19  POCT urinalysis dipstick  Result Value Ref Range   Color, UA dark yellow    Clarity, UA clear    Glucose, UA Negative Negative   Bilirubin, UA small    Ketones, UA negative    Spec Grav, UA 1.025 1.010 - 1.025   Blood, UA negative    pH, UA 5.0 5.0 - 8.0   Protein, UA Negative Negative   Urobilinogen, UA 0.2 0.2 or 1.0 E.U./dL   Nitrite, UA negative    Leukocytes, UA Small (1+) (A) Negative   Appearance     Odor      Assessment & Plan     Urinary frequency - Plan: Urine Culture, POCT urinalysis dipstick  Leukocytes in urine  Meds ordered this encounter  Medications  . cephALEXin (KEFLEX) 500 MG capsule    Sig: Take 1 capsule (500 mg total) by mouth 2 (two) times daily.    Dispense:  14 capsule    Refill:  0    1. Urinary frequency  - Urine Culture - POCT urinalysis dipstick  2. Leukocytes in urine As above.  Return if symptoms worsen or fail to improve, for at any time for any worsening symptoms, Go to Emergency room/ urgent care if worse.      Red Flags discussed. The patient was given clear instructions to go to ER or return to medical center if any red flags develop, symptoms do not improve, worsen or new problems develop. They verbalized understanding.    Marcille Buffy, [place] [phone] (phone) [phone] (fax)  [place]

## 2019-12-13 NOTE — Progress Notes (Signed)
Small leukocytes- symptomatic - started on Keflex 500 mg BID for 7 days, sent for culture.

## 2019-12-18 ENCOUNTER — Telehealth: Payer: Self-pay

## 2019-12-18 ENCOUNTER — Other Ambulatory Visit: Payer: Self-pay | Admitting: Adult Health

## 2019-12-18 LAB — URINE CULTURE

## 2019-12-18 MED ORDER — CEPHALEXIN 500 MG PO CAPS: 500.0000 mg | ORAL_CAPSULE | Freq: Two times a day (BID) | ORAL | 0 refills | Status: DC

## 2019-12-18 NOTE — Telephone Encounter (Signed)
-----   Message from Doreen Beam, [place] sent at 12/18/2019 11:49 AM EST ----- E coli in urine. Complete keflex, she has and I sent in 5 more days of Keflex for total of 10 days  return if symptoms persist or worsen at anytime.

## 2019-12-18 NOTE — Telephone Encounter (Signed)
LMTCB 12/18/2019.  PEC please advise pt as below.   Thanks,   -Mickel Baas

## 2019-12-18 NOTE — Progress Notes (Signed)
E coli in urine. Complete keflex, she has and I sent in 5 more days of Keflex for total of 10 days  return if symptoms persist or worsen at anytime.

## 2019-12-19 NOTE — Telephone Encounter (Signed)
Daughter is calling for results- mother got call and was confused- asked her to call. Advised daughter: Chrys Racer culture result and medication  Rx.

## 2019-12-19 NOTE — Telephone Encounter (Signed)
See result note.  

## 2019-12-23 ENCOUNTER — Other Ambulatory Visit: Payer: Self-pay | Admitting: Cardiovascular Disease

## 2019-12-25 ENCOUNTER — Other Ambulatory Visit: Payer: Self-pay | Admitting: Family Medicine

## 2020-01-10 NOTE — Progress Notes (Signed)
I,[name],acting as a scribe for [name], MD.,have documented all relevant documentation on the behalf of [name], MD,as directed by  [name], MD while in the presence of [name], MD.   Established patient visit   Patient: Terri Braun   DOB: 05-Aug-1929   86 y.o. Female  MRN: [id number] Visit Date: 01/13/2020  Today's healthcare provider: [name], MD   Chief Complaint  Patient presents with  . Follow-up   Subjective    HPI  Patient comes in today for follow-up of urinary symptoms.  She has had nocturia x3 for about a year.  She also has some urinary hesitancy and urgency and mild dysuria at times.  Also urinary frequency. Urinary frequency From 11/12/20021- POCT urinalysis dipstick amd Urine Culture-obtained. Patient was given rx for cephalexin 500 mg bid. x2 rounds of Keflex.   Patient states she is still having urinary frequency. occasional burning and pain upon urination.       Medications: Outpatient Medications Prior to Visit  Medication Sig  . acetaminophen (TYLENOL) 500 MG tablet Take 500 mg by mouth every 4 (four) hours as needed.  Marland Kitchen alendronate (FOSAMAX) 70 MG tablet alendronate 70 mg tablet  TAKE 1 TABLET BY MOUTH ONCE WEEKLY  . celecoxib (CELEBREX) 100 MG capsule TAKE 1 CAPSULE BY MOUTH EVERY DAY  . clopidogrel (PLAVIX) 75 MG tablet TAKE 1 TABLET BY MOUTH EVERY DAY  . DULoxetine (CYMBALTA) 60 MG capsule TAKE 1 CAPSULE BY MOUTH EVERY DAY  . esomeprazole (NEXIUM) 20 MG capsule Take 20 mg by mouth daily at 12 noon.  . isosorbide mononitrate (IMDUR) 30 MG 24 hr tablet Take 1 tablet (30 mg total) by mouth daily.  Marland Kitchen lidocaine (LIDODERM) 5 % Place 1 patch onto the skin daily. Remove & Discard patch within 12 hours or as directed by MD  . losartan (COZAAR) 100 MG tablet TAKE 1 TABLET BY MOUTH EVERY DAY  . nitroGLYCERIN (NITROSTAT) 0.4 MG SL tablet Place 1 tablet (0.4 mg total) under the tongue every 5 (five)  minutes as needed for chest pain.  . rosuvastatin (CRESTOR) 10 MG tablet TAKE 1 TABLET BY MOUTH EVERY DAY  . umeclidinium-vilanterol (ANORO ELLIPTA) 62.5-25 MCG/INH AEPB Inhale 1 puff into the lungs daily.  . [DISCONTINUED] cephALEXin (KEFLEX) 500 MG capsule Take 1 capsule (500 mg total) by mouth 2 (two) times daily.   No facility-administered medications prior to visit.    Review of Systems  Constitutional: Negative for appetite change, chills, fatigue and fever.  Respiratory: Negative for chest tightness and shortness of breath.   Cardiovascular: Negative for chest pain and palpitations.  Gastrointestinal: Negative for abdominal pain, nausea and vomiting.  Neurological: Negative for dizziness and weakness.       Objective    BP (!) 161/67 (BP Location: Left Arm, Patient Position: Sitting, Cuff Size: Normal)   Pulse 62   Temp 98.6 F (37 C) (Oral)   Resp 18   Wt 156 lb (70.8 kg)   SpO2 98%   BMI 25.96 kg/m  BP Readings from Last 3 Encounters:  01/13/20 (!) 161/67  12/13/19 (!) 135/49  09/02/19 (!) 124/58   Wt Readings from Last 3 Encounters:  01/13/20 156 lb (70.8 kg)  12/13/19 148 lb 6.4 oz (67.3 kg)  09/02/19 160 lb (72.6 kg)      Physical Exam Vitals reviewed.  Constitutional:      Appearance: Normal appearance.  HENT:     Head:  Normocephalic and atraumatic.     Mouth/Throat:     Pharynx: Oropharynx is clear.  Eyes:     Conjunctiva/sclera: Conjunctivae normal.  Cardiovascular:     Rate and Rhythm: Normal rate and regular rhythm.     Pulses: Normal pulses.     Heart sounds: Normal heart sounds. No murmur heard. No friction rub. No gallop.   Pulmonary:     Effort: Pulmonary effort is normal. No respiratory distress.     Breath sounds: Normal breath sounds. No stridor. No wheezing, rhonchi or rales.  Chest:     Chest wall: No tenderness.  Abdominal:     Palpations: Abdomen is soft.     Tenderness: There is no right CVA tenderness, left CVA tenderness,  guarding or rebound.  Musculoskeletal:        General: Normal range of motion.     Cervical back: Normal range of motion and neck supple.  Skin:    General: Skin is warm.  Neurological:     Mental Status: She is alert. Mental status is at baseline.     Motor: No weakness.     Gait: Gait normal.  Psychiatric:        Mood and Affect: Mood normal.        Behavior: Behavior normal.        Thought Content: Thought content normal.        Judgment: Judgment normal.       Results for orders placed or performed in visit on 01/13/20  POCT urinalysis dipstick  Result Value Ref Range   Color, UA Yellow    Clarity, UA Clear    Glucose, UA Negative Negative   Bilirubin, UA Negative    Ketones, UA Negative    Spec Grav, UA 1.020 1.010 - 1.025   Blood, UA Negative    pH, UA 5.0 5.0 - 8.0   Protein, UA Positive (A) Negative   Urobilinogen, UA 0.2 0.2 or 1.0 E.U./dL   Nitrite, UA Negative    Leukocytes, UA Moderate (2+) (A) Negative   Appearance Normal    Odor Normal     Assessment & Plan     1. Urinary frequency  - POCT urinalysis dipstick - CULTURE, URINE COMPREHENSIVE  2. Acute cystitis without hematuria  - CULTURE, URINE COMPREHENSIVE - cefdinir (OMNICEF) 300 MG capsule; Take 1 capsule (300 mg total) by mouth 2 (two) times daily.  Dispense: 10 capsule; Refill: 0  3. Atrophic vaginitis Gust treating for presumptive atrophic urethritis.  Dab of Premarin vaginal cream in the urethra bedtime. - conjugated estrogens (PREMARIN) vaginal cream; Apply one dab to Urethra daily at bedtime  Dispense: 42.5 g; Refill: 12   Return in about 2 months (around 03/15/2020).         Matty Deamer Cranford Mon, MD  [facility] [phone] (phone) [phone] (fax)  [place]

## 2020-01-13 ENCOUNTER — Other Ambulatory Visit: Payer: Self-pay

## 2020-01-13 ENCOUNTER — Encounter: Payer: Self-pay | Admitting: Family Medicine

## 2020-01-13 ENCOUNTER — Ambulatory Visit (INDEPENDENT_AMBULATORY_CARE_PROVIDER_SITE_OTHER): Payer: Medicare HMO | Admitting: Family Medicine

## 2020-01-13 VITALS — BP 161/67 | HR 62 | Temp 98.6°F | Resp 18 | Wt 156.0 lb

## 2020-01-13 DIAGNOSIS — N952 Postmenopausal atrophic vaginitis: Secondary | ICD-10-CM | POA: Diagnosis not present

## 2020-01-13 DIAGNOSIS — N3000 Acute cystitis without hematuria: Secondary | ICD-10-CM | POA: Diagnosis not present

## 2020-01-13 DIAGNOSIS — R350 Frequency of micturition: Secondary | ICD-10-CM | POA: Diagnosis not present

## 2020-01-13 LAB — POCT URINALYSIS DIPSTICK
Appearance: NORMAL
Bilirubin, UA: NEGATIVE
Blood, UA: NEGATIVE
Glucose, UA: NEGATIVE
Ketones, UA: NEGATIVE
Nitrite, UA: NEGATIVE
Odor: NORMAL
Protein, UA: POSITIVE — AB
Spec Grav, UA: 1.02 (ref 1.010–1.025)
Urobilinogen, UA: 0.2 E.U./dL
pH, UA: 5 (ref 5.0–8.0)

## 2020-01-13 MED ORDER — PREMARIN 0.625 MG/GM VA CREA: TOPICAL_CREAM | VAGINAL | 12 refills | Status: DC

## 2020-01-13 MED ORDER — CEFDINIR 300 MG PO CAPS: 300.0000 mg | ORAL_CAPSULE | Freq: Two times a day (BID) | ORAL | 0 refills | Status: DC

## 2020-01-14 DIAGNOSIS — R69 Illness, unspecified: Secondary | ICD-10-CM | POA: Diagnosis not present

## 2020-01-22 LAB — CULTURE, URINE COMPREHENSIVE

## 2020-01-29 ENCOUNTER — Telehealth: Payer: Self-pay | Admitting: *Deleted

## 2020-01-29 NOTE — Chronic Care Management (AMB) (Signed)
  Chronic Care Management   Note  01/29/2020 Name: Terri Braun MRN: [id number] DOB: 04-13-1929  Terri Braun is a 86 y.o. year old female who is a primary care patient of Terri Braun., MD. I reached out to Terri Braun by phone today in response to a referral sent by Terri Braun's health plan.     Terri Braun was given information about Chronic Care Management services today including:  1. CCM service includes personalized support from designated clinical staff supervised by her physician, including individualized plan of care and coordination with other care providers 2. 24/7 contact phone numbers for assistance for urgent and routine care needs. 3. Service will only be billed when office clinical staff spend 20 minutes or more in a month to coordinate care. 4. Only one practitioner may furnish and bill the service in a calendar month. 5. The patient may stop CCM services at any time (effective at the end of the month) by phone call to the office staff. 6. The patient will be responsible for cost sharing (co-pay) of up to 20% of the service fee (after annual deductible is met).  Terri Braun patient daughter  verbally agreed to assistance and services provided by embedded care coordination/care management team today.  Follow up plan: Telephone appointment with care management team member scheduled for:02/18/2020  [place] [facility]  Direct Dial: [phone]

## 2020-02-07 DIAGNOSIS — M1712 Unilateral primary osteoarthritis, left knee: Secondary | ICD-10-CM | POA: Diagnosis not present

## 2020-02-12 ENCOUNTER — Other Ambulatory Visit: Payer: Self-pay | Admitting: Family Medicine

## 2020-02-12 DIAGNOSIS — M2550 Pain in unspecified joint: Secondary | ICD-10-CM

## 2020-02-18 ENCOUNTER — Telehealth: Payer: Self-pay

## 2020-02-18 ENCOUNTER — Telehealth: Payer: Medicare HMO

## 2020-02-18 NOTE — Telephone Encounter (Signed)
  Chronic Care Management   Outreach Note  02/18/2020 Name: Zamara Cozad MRN: [id number] DOB: [birth year]  Primary Care Provider: Jerrol Banana., MD Reason for referral : Chronic Care Management   An unsuccessful telephone outreach was attempted today. Ms. Eid was referred to the case management team for assistance with care management and care coordination.     Follow Up Plan:  A HIPAA compliant voice message was left today requesting a return call.    [facility] [phone]

## 2020-03-10 ENCOUNTER — Telehealth: Payer: Self-pay

## 2020-03-10 NOTE — Telephone Encounter (Signed)
  Chronic Care Management   Outreach Note  03/10/2020 Name: Terri Braun MRN: [id number] DOB: March 02, 1929  Primary Care Provider: Jerrol Banana., MD Reason for referral : Chronic Care Management  An unsuccessful telephone outreach was attempted today. [name] was referred to the case management team for assistance with care management and care coordination.     Follow Up Plan:  A HIPAA compliant voice message was left today requesting a return call.   [facility] [phone]

## 2020-03-13 NOTE — Progress Notes (Signed)
I,Terri Braun,acting as a scribe for [name], MD.,have documented all relevant documentation on the behalf of [name], MD,as directed by  [name], MD while in the presence of [name], MD.   Established patient visit   Patient: Terri Braun   DOB: 1929-08-20   87 y.o. Female  MRN: [id number] Visit Date: 03/16/2020  Today's healthcare provider: [name], MD   No chief complaint on file.  Subjective    HPI  Patient slipped and fell on her buttocks 4 days ago.  She has a soreness on her right upper arm and she is sore in her tailbone area.  She has no problems bearing weight.  She has full range of motion in her arm. Hypertension, follow-up  BP Readings from Last 3 Encounters:  01/13/20 (!) 161/67  12/13/19 (!) 135/49  09/02/19 (!) 124/58   Wt Readings from Last 3 Encounters:  01/13/20 156 lb (70.8 kg)  12/13/19 148 lb 6.4 oz (67.3 kg)  09/02/19 160 lb (72.6 kg)     She was last seen for hypertension 6 months ago.  BP at that visit was 124/58. Management since that visit includes; on losartan.  .She reports good compliance with treatment. She is not having side effects. none She is not exercising. She is adherent to low salt diet.   Outside blood pressures are not checking.  She does not smoke.  Use of agents associated with hypertension: none.   --------------------------------------------------------------------  Lipid/Cholesterol, follow-up  Last Lipid Panel: Lab Results  Component Value Date   CHOL 134 04/05/2019   LDLCALC 36 04/05/2019   HDL 81 04/05/2019   TRIG 90 04/05/2019    She was last seen for this 11 months ago.  Management since that visit includes; on rosuvastatin.  She reports good compliance with treatment. She is not having side effects. none She is following a Regular, Low Sodium diet. Current exercise: none  Last metabolic panel Lab Results  Component Value Date   GLUCOSE  126 (H) 08/22/2019   NA 139 08/22/2019   K 4.2 08/22/2019   BUN 27 (H) 08/22/2019   CREATININE 1.13 (H) 08/22/2019   GFRNONAA 44 (L) 08/22/2019   GFRAA 51 (L) 08/22/2019   CALCIUM 9.1 08/22/2019   AST 25 09/02/2019   ALT 15 09/02/2019   The ASCVD Risk score Mikey Bussing DC Jr., et al., 2013) failed to calculate for the following reasons:   The 2013 ASCVD risk score is only valid for ages 15 to 77  --------------------------------------------------------------------  Acute cystitis without hematuria From 01/13/2020-given rx for Omnicef 300 mg.  Atrophic vaginitis From 01/13/2020-Gust treating for presumptive atrophic urethritis.  Dab of Premarin vaginal cream in the urethra bedtime.   Patient had a fall one week ago. Patient fell backwards inside her front doorway. Patient states she lost her balance. Patient states the right side of her buttocks is very sore.     Medications: Outpatient Medications Prior to Visit  Medication Sig  . acetaminophen (TYLENOL) 500 MG tablet Take 500 mg by mouth every 4 (four) hours as needed.  Marland Kitchen alendronate (FOSAMAX) 70 MG tablet alendronate 70 mg tablet  TAKE 1 TABLET BY MOUTH ONCE WEEKLY  . cefdinir (OMNICEF) 300 MG capsule Take 1 capsule (300 mg total) by mouth 2 (two) times daily.  . celecoxib (CELEBREX) 100 MG capsule TAKE 1 CAPSULE BY MOUTH EVERY DAY  . clopidogrel (PLAVIX) 75 MG tablet TAKE 1 TABLET BY MOUTH EVERY DAY  .  conjugated estrogens (PREMARIN) vaginal cream Apply one dab to Urethra daily at bedtime  . DULoxetine (CYMBALTA) 60 MG capsule TAKE 1 CAPSULE BY MOUTH EVERY DAY  . esomeprazole (NEXIUM) 20 MG capsule Take 20 mg by mouth daily at 12 noon.  . isosorbide mononitrate (IMDUR) 30 MG 24 hr tablet Take 1 tablet (30 mg total) by mouth daily.  Marland Kitchen lidocaine (LIDODERM) 5 % Place 1 patch onto the skin daily. Remove & Discard patch within 12 hours or as directed by MD  . losartan (COZAAR) 100 MG tablet TAKE 1 TABLET BY MOUTH EVERY DAY  .  nitroGLYCERIN (NITROSTAT) 0.4 MG SL tablet Place 1 tablet (0.4 mg total) under the tongue every 5 (five) minutes as needed for chest pain.  . rosuvastatin (CRESTOR) 10 MG tablet TAKE 1 TABLET BY MOUTH EVERY DAY  . umeclidinium-vilanterol (ANORO ELLIPTA) 62.5-25 MCG/INH AEPB Inhale 1 puff into the lungs daily.   No facility-administered medications prior to visit.    Review of Systems  Constitutional: Negative for appetite change, chills, fatigue and fever.  Respiratory: Negative for chest tightness and shortness of breath.   Cardiovascular: Negative for chest pain and palpitations.  Gastrointestinal: Negative for abdominal pain, nausea and vomiting.  Neurological: Negative for dizziness and weakness.       Objective    There were no vitals taken for this visit.    Physical Exam Vitals reviewed.  Constitutional:      Appearance: Normal appearance.  HENT:     Head: Normocephalic and atraumatic.     Mouth/Throat:     Pharynx: Oropharynx is clear.  Eyes:     Conjunctiva/sclera: Conjunctivae normal.  Cardiovascular:     Rate and Rhythm: Normal rate and regular rhythm.     Pulses: Normal pulses.     Heart sounds: Normal heart sounds. No murmur heard. No friction rub. No gallop.   Pulmonary:     Effort: Pulmonary effort is normal. No respiratory distress.     Breath sounds: Normal breath sounds. No stridor. No wheezing, rhonchi or rales.  Chest:     Chest wall: No tenderness.  Abdominal:     Palpations: Abdomen is soft.     Tenderness: There is no right CVA tenderness, left CVA tenderness, guarding or rebound.  Musculoskeletal:     Cervical back: Normal range of motion and neck supple.     Comments: She has minimal tenderness of the coccyx.  No lower back tenderness and no pain with movement or weightbearing with her legs  Skin:    General: Skin is warm and dry.     Comments: He has a baseball sized contusion to the right mid arm above the biceps  Neurological:     Mental  Status: She is alert. Mental status is at baseline.     Motor: No weakness.     Gait: Gait normal.  Psychiatric:        Mood and Affect: Mood normal.        Behavior: Behavior normal.        Thought Content: Thought content normal.        Judgment: Judgment normal.       No results found for any visits on 03/16/20.  Assessment & Plan     1. Fall, initial encounter Patient had no loss of consciousness nor did she hit her head when she fell.  I do not think she needs imaging.  This happened 4 days ago so we will now advise her to  use heating pad to help with areas that are still sore  2. Essential hypertension, benign Good control - Lipid panel - TSH - CBC w/Diff/Platelet - Comprehensive Metabolic Panel (CMET)  3. Primary hypothyroidism Follow TSH - Lipid panel - TSH - CBC w/Diff/Platelet - Comprehensive Metabolic Panel (CMET)  4. Mixed hyperlipidemia On rosuvastatin 10 - Lipid panel - TSH - CBC w/Diff/Platelet - Comprehensive Metabolic Panel (CMET)  5. Coronary artery disease, unspecified vessel or lesion type, unspecified whether angina present, unspecified whether native or transplanted heart All risk factors treated.  Continue Imdur - Lipid panel - TSH - CBC w/Diff/Platelet - Comprehensive Metabolic Panel (CMET)   No follow-ups on file.      I, [name], MD, have reviewed all documentation for this visit. The documentation on 03/19/20 for the exam, diagnosis, procedures, and orders are all accurate and complete.    Rodolphe Edmonston Cranford Mon, MD  [facility] [phone] (phone) [phone] (fax)  [place]

## 2020-03-16 ENCOUNTER — Ambulatory Visit (INDEPENDENT_AMBULATORY_CARE_PROVIDER_SITE_OTHER): Payer: Medicare HMO | Admitting: Family Medicine

## 2020-03-16 ENCOUNTER — Other Ambulatory Visit: Payer: Self-pay

## 2020-03-16 ENCOUNTER — Encounter: Payer: Self-pay | Admitting: Family Medicine

## 2020-03-16 VITALS — BP 131/71 | HR 67 | Temp 98.9°F | Resp 18 | Ht 65.0 in | Wt 150.0 lb

## 2020-03-16 DIAGNOSIS — E039 Hypothyroidism, unspecified: Secondary | ICD-10-CM

## 2020-03-16 DIAGNOSIS — E782 Mixed hyperlipidemia: Secondary | ICD-10-CM

## 2020-03-16 DIAGNOSIS — I10 Essential (primary) hypertension: Secondary | ICD-10-CM

## 2020-03-16 DIAGNOSIS — W19XXXA Unspecified fall, initial encounter: Secondary | ICD-10-CM | POA: Diagnosis not present

## 2020-03-16 DIAGNOSIS — I2510 Atherosclerotic heart disease of native coronary artery without angina pectoris: Secondary | ICD-10-CM | POA: Diagnosis not present

## 2020-03-18 ENCOUNTER — Telehealth: Payer: Self-pay

## 2020-03-18 NOTE — Telephone Encounter (Signed)
Copied from [place] [id number]. Topic: General - Other >> Mar 18, 2020 11:25 AM Keene Breath wrote: Reason for CRM: Patient's daughter called to ask the nurse to call regarding the notes she received at the end of the visit.  She is concerned because there is a discrepancy with the notes and what the doctor actually saw the patient for.  Daughter feels the notes might have gotten mixed up with someone else.  Please call to discuss at [phone]

## 2020-03-18 NOTE — Telephone Encounter (Signed)
Returned call to patient's daughter Arbie Cookey and explained AVS to her.

## 2020-03-19 ENCOUNTER — Other Ambulatory Visit: Payer: Self-pay | Admitting: Cardiovascular Disease

## 2020-03-19 NOTE — Telephone Encounter (Signed)
Scheduled for 3/11.

## 2020-03-19 NOTE — Telephone Encounter (Signed)
Please schedule 12 month F/U appointment. Thank you! 

## 2020-03-22 ENCOUNTER — Other Ambulatory Visit: Payer: Self-pay | Admitting: Family Medicine

## 2020-03-24 ENCOUNTER — Ambulatory Visit: Payer: Self-pay

## 2020-03-24 NOTE — Chronic Care Management (AMB) (Signed)
  Chronic Care Management   Outreach Note  03/24/2020 Name: Terri Braun MRN: [id number] DOB: [birth year]  Primary Care Provider: Jerrol Banana., MD Reason for referral : Chronic Care Management   [name] was referred to the care management team for assistance with chronic care management and care coordination. Her primary care provider will be notified of our unsuccessful attempts to maintain contact. The care management team will gladly outreach at any time in the future if she is interested in receiving assistance.    PLAN The care management team will gladly follow up with [name] after the primary care provider has a conversation with her regarding recommendation for care management engagement and subsequent re-referral for care management services.    [facility] [phone]

## 2020-04-08 NOTE — Progress Notes (Deleted)
Cardiology Office Note    Date:  04/08/2020   ID:  Terri Braun, DOB 20-Sep-1929, MRN [id number]  PCP:  Jerrol Banana., MD  Cardiologist:  Ida Rogue, MD  Electrophysiologist:  None   Chief Complaint: Follow-up  History of Present Illness:   Terri Braun is a 87 y.o. female with history of CAD status post PCI/DES to the RCA in 2012, COPD with chronic shortness of breath, HTN, HLD, anemia, hypothyroidism, gait instability with falls, osteoarthritis, and GERD who presents for follow-up of her CAD.  She was previously followed by cardiology in [place], [place].  She underwent LHC in 04/2010 with PCI/DES with a Xience drug-eluting stent to the ostial RCA.  Repeat cath in 2015 demonstrated a patent stent with 50% ostial narrowing followed by 30% ISR as well as a 20 to 30% LAD disease, 20 to 30% ostial LCx disease, and a small PDA with 40% stenosis.  Echo in 03/2015 showed normal LV systolic function with mild mitral regurgitation and aortic insufficiency.  She was last seen in the office in 04/2019 and was doing well from a cardiac perspective though was noted to have broken 3 bones in her right foot which limited her ambulation.  She was seen in [facility] ED in 08/2019 with possible syncope versus fall.  Troponin was checked x1 and found to be normal.  EKG showed sinus rhythm with no acute ischemic changes.  CT head showed no evidence of acute intracranial abnormality with noted small chronic infarcts within the cerebellar hemispheres bilaterally and chronic small vessel ischemic disease.  UA consistent with UTI.  Hemoglobin was low though stable.  Symptoms were felt to be related to vasovagal near syncope and she was treated for UTI.  ***   Labs independently reviewed: 09/2019 - albumin 4.0, AST/ALT normal, TSH normal 08/2019 - Hgb 10.0, PLT 186, BUN 27, serum creatinine 1.13, potassium 4.2 04/2019 - TC 134, TG 90, HDL 81, LDL 36  Past Medical History:  Diagnosis Date  .  Anemia   . ASCVD (arteriosclerotic cardiovascular disease)   . COPD (chronic obstructive pulmonary disease) ([place])   . Diverticulosis   . GERD (gastroesophageal reflux disease)   . Hypertension   . Hypothyroidism     Past Surgical History:  Procedure Laterality Date  . APPENDECTOMY    . BREAST SURGERY  1983   Lumpectomy  . CHOLECYSTECTOMY    . COLON SURGERY    . COLONOSCOPY  11/04/2015  . CORONARY ANGIOPLASTY WITH STENT PLACEMENT    . NISSEN FUNDOPLICATION    . TOTAL ABDOMINAL HYSTERECTOMY     With BSO    Current Medications: No outpatient medications have been marked as taking for the 04/10/20 encounter (Appointment) with Rise Mu, PA-C.    Allergies:   Shellfish allergy and Sulfa antibiotics   Social History   Socioeconomic History  . Marital status: Widowed    Spouse name: Not on file  . Number of children: Not on file  . Years of education: Not on file  . Highest education level: Not on file  Occupational History  . Not on file  Tobacco Use  . Smoking status: Never Smoker  . Smokeless tobacco: Never Used  Vaping Use  . Vaping Use: Never used  Substance and Sexual Activity  . Alcohol use: Never  . Drug use: Never  . Sexual activity: Not on file  Other Topics Concern  . Not on file  Social History Narrative   She is  widowed she has sons and daughters, she moved here from [place] to live in [place] near her daughter   2 caffeinated beverages a day   Rare alcohol   Never smoker   Social Determinants of Radio broadcast assistant Strain: Not on file  Food Insecurity: Not on file  Transportation Needs: Not on file  Physical Activity: Not on file  Stress: Not on file  Social Connections: Not on file     Family History:  The patient's family history includes Cancer in an other family member; Coronary artery disease in her mother; Crohn's disease in her son; Heart disease in her mother.  ROS:   ROS   EKGs/Labs/Other Studies  Reviewed:    Studies reviewed were summarized above. The additional studies were reviewed today: As above.   EKG:  EKG is ordered today.  The EKG ordered today demonstrates ***  Recent Labs: 08/22/2019: BUN 27; Creatinine, Ser 1.13; Hemoglobin 10.0; Platelets 186; Potassium 4.2; Sodium 139 09/02/2019: ALT 15; TSH 3.090  Recent Lipid Panel    Component Value Date/Time   CHOL 134 04/05/2019 1039   TRIG 90 04/05/2019 1039   HDL 81 04/05/2019 1039   CHOLHDL 1.7 04/05/2019 1039   LDLCALC 36 04/05/2019 1039    PHYSICAL EXAM:    VS:  There were no vitals taken for this visit.  BMI: There is no height or weight on file to calculate BMI.  Physical Exam  Wt Readings from Last 3 Encounters:  03/16/20 150 lb (68 kg)  01/13/20 156 lb (70.8 kg)  12/13/19 148 lb 6.4 oz (67.3 kg)     ASSESSMENT & PLAN:   1. CAD involving the native coronary arteries without***angina:  2. Vasovagal syncope versus fall with gait instability:  3. HLD: LDL 36 from 04/2019.  4. Anemia:  5. COPD:   Disposition: F/u with Dr. Rockey Situ or an APP in ***.   Medication Adjustments/Labs and Tests Ordered: Current medicines are reviewed at length with the patient today.  Concerns regarding medicines are outlined above. Medication changes, Labs and Tests ordered today are summarized above and listed in the Patient Instructions accessible in Encounters.   Signed, Christell Faith, PA-C 04/08/2020 11:52 AM     [facility] [address] [place] [address] [place], [place] [address] [phone]

## 2020-04-10 ENCOUNTER — Ambulatory Visit: Payer: Medicare HMO | Admitting: Physician Assistant

## 2020-04-11 ENCOUNTER — Other Ambulatory Visit: Payer: Self-pay | Admitting: Cardiovascular Disease

## 2020-04-12 ENCOUNTER — Other Ambulatory Visit: Payer: Self-pay | Admitting: Family Medicine

## 2020-04-12 ENCOUNTER — Other Ambulatory Visit: Payer: Self-pay | Admitting: Cardiovascular Disease

## 2020-04-12 NOTE — Telephone Encounter (Signed)
Requested Prescriptions  Pending Prescriptions Disp Refills  . DULoxetine (CYMBALTA) 60 MG capsule [Pharmacy Med Name: DULOXETINE HCL DR 60 MG CAP] 90 capsule 0    Sig: TAKE 1 CAPSULE BY MOUTH EVERY DAY     Psychiatry: Antidepressants - SNRI Passed - 04/12/2020  9:01 AM      Passed - Completed PHQ-2 or PHQ-9 in the last 360 days      Passed - Last BP in normal range    BP Readings from Last 1 Encounters:  03/16/20 131/71         Passed - Valid encounter within last 6 months    Recent Outpatient Visits          3 weeks ago Fall, initial encounter   [facility] Jerrol Banana., MD   3 months ago Urinary frequency   [facility] Jerrol Banana., MD   4 months ago Urinary frequency   [facility] Flinchum, Kelby Aline, FNP   7 months ago Annual physical exam   [facility] Jerrol Banana., MD   10 months ago Osteoporosis, unspecified osteoporosis type, unspecified pathological fracture presence   [facility] Jerrol Banana., MD      Future Appointments            In 1 week Dunn, Areta Haber, PA-C [facility]

## 2020-04-18 DIAGNOSIS — S93401A Sprain of unspecified ligament of right ankle, initial encounter: Secondary | ICD-10-CM | POA: Diagnosis not present

## 2020-04-18 NOTE — Progress Notes (Signed)
Cardiology Office Note    Date:  04/23/2020   ID:  Terri Braun, DOB 08/24/1929, MRN [id number]  PCP:  Jerrol Banana., MD  Cardiologist:  Ida Rogue, MD  Electrophysiologist:  None   Chief Complaint: Follow up  History of Present Illness:   Terri Braun is a 87 y.o. female with history of CAD status post PCI/DES to the RCA in 2012, COPD with chronic shortness of breath, HTN, HLD, anemia, hypothyroidism, gait instability with falls, osteoarthritis, and GERD who presents for follow-up of her CAD.  She was previously followed by cardiology in [place], [place].  She underwent LHC in 04/2010 with PCI/DES with a Xience drug-eluting stent to the ostial RCA.  Repeat cath in 2015 demonstrated a patent stent with 50% ostial narrowing followed by 30% ISR as well as a 20 to 30% LAD disease, 20 to 30% ostial LCx disease, and a small PDA with 40% stenosis.  Echo in 03/2015 showed normal LV systolic function with mild mitral regurgitation and aortic insufficiency.  She was last seen in the office in 04/2019 and was doing well from a cardiac perspective though was noted to have broken 3 bones in her right foot which limited her ambulation.  She was seen in the [facility] ED in 08/2019 with possible syncope versus fall.  Troponin was checked x1 and found to be normal.  EKG showed sinus rhythm with no acute ischemic changes.  CT head showed no evidence of acute intracranial abnormality with noted small chronic infarcts within the cerebellar hemispheres bilaterally and chronic small vessel ischemic disease.  UA consistent with UTI.  Hemoglobin was low though stable.  Symptoms were felt to be related to vasovagal near syncope and she was treated for UTI.  She comes in today accompanied by her daughter and is doing reasonably well from a cardiac perspective.  She does note a long history of left lateral chest discomfort that is primarily noted in the evening hours when laying down and will last for  "a good while."  She has at times taken SL NTG with noted symptomatic improvement.  No associated symptoms.  She is uncertain if this discomfort feels similar to her prior angina leading up to her PCI and subsequent repeat cath.  She has these episodes a couple times per week, last having them approximately 2 weeks ago.  She is currently chest pain-free.  She does continue to have an unsteady gait though does walk with a walker now.  Her unsteady gait is attributed to significant left knee arthritis, a sprained right ankle, and prior fractures in the right foot.   Labs independently reviewed: 09/2019 - albumin 4.0, AST/ALT normal, TSH normal 08/2019 - Hgb 10.0, PLT 186, potassium 4.2, BUN 27, serum creatinine 1.13 04/2019 - TC 134, TG 90, HDL 81, LDL 36  Past Medical History:  Diagnosis Date  . Anemia   . ASCVD (arteriosclerotic cardiovascular disease)   . COPD (chronic obstructive pulmonary disease) ([place])   . Diverticulosis   . GERD (gastroesophageal reflux disease)   . Hypertension   . Hypothyroidism     Past Surgical History:  Procedure Laterality Date  . APPENDECTOMY    . BREAST SURGERY  1983   Lumpectomy  . CHOLECYSTECTOMY    . COLON SURGERY    . COLONOSCOPY  11/04/2015  . CORONARY ANGIOPLASTY WITH STENT PLACEMENT    . NISSEN FUNDOPLICATION    . TOTAL ABDOMINAL HYSTERECTOMY     With BSO    Current  Medications: Current Meds  Medication Sig  . acetaminophen (TYLENOL) 500 MG tablet Take 500 mg by mouth every 4 (four) hours as needed.  Marland Kitchen alendronate (FOSAMAX) 70 MG tablet alendronate 70 mg tablet  TAKE 1 TABLET BY MOUTH ONCE WEEKLY  . celecoxib (CELEBREX) 100 MG capsule TAKE 1 CAPSULE BY MOUTH EVERY DAY  . conjugated estrogens (PREMARIN) vaginal cream Apply one dab to Urethra daily at bedtime  . DULoxetine (CYMBALTA) 60 MG capsule TAKE 1 CAPSULE BY MOUTH EVERY DAY  . lidocaine (LIDODERM) 5 % Place 1 patch onto the skin daily. Remove & Discard patch within 12 hours or as  directed by MD  . nitroGLYCERIN (NITROSTAT) 0.4 MG SL tablet Place 1 tablet (0.4 mg total) under the tongue every 5 (five) minutes as needed for chest pain.  . [DISCONTINUED] clopidogrel (PLAVIX) 75 MG tablet TAKE 1 TABLET BY MOUTH EVERY DAY  . [DISCONTINUED] isosorbide mononitrate (IMDUR) 30 MG 24 hr tablet Take 1 tablet (30 mg total) by mouth daily.  . [DISCONTINUED] losartan (COZAAR) 100 MG tablet TAKE 1 TABLET BY MOUTH EVERY DAY  . [DISCONTINUED] rosuvastatin (CRESTOR) 10 MG tablet TAKE 1 TABLET DAILY. PLEASE SCHEDULE OFFICE VISIT FOR FURTHER REFILLS    Allergies:   Shellfish allergy and Sulfa antibiotics   Social History   Socioeconomic History  . Marital status: Widowed    Spouse name: Not on file  . Number of children: Not on file  . Years of education: Not on file  . Highest education level: Not on file  Occupational History  . Not on file  Tobacco Use  . Smoking status: Never Smoker  . Smokeless tobacco: Never Used  Vaping Use  . Vaping Use: Never used  Substance and Sexual Activity  . Alcohol use: Never  . Drug use: Never  . Sexual activity: Not on file  Other Topics Concern  . Not on file  Social History Narrative   She is widowed she has sons and daughters, she moved here from [place] to live in [place] near her daughter   2 caffeinated beverages a day   Rare alcohol   Never smoker   Social Determinants of Radio broadcast assistant Strain: Not on file  Food Insecurity: Not on file  Transportation Needs: Not on file  Physical Activity: Not on file  Stress: Not on file  Social Connections: Not on file     Family History:  The patient's family history includes Cancer in an other family member; Coronary artery disease in her mother; Crohn's disease in her son; Heart disease in her mother.  ROS:   Review of Systems  Constitutional: Positive for malaise/fatigue. Negative for chills, diaphoresis, fever and weight loss.  HENT: Negative  for congestion.   Eyes: Negative for discharge and redness.  Respiratory: Negative for cough, sputum production, shortness of breath and wheezing.   Cardiovascular: Positive for chest pain. Negative for palpitations, orthopnea, claudication, leg swelling and PND.  Gastrointestinal: Negative for abdominal pain, blood in stool, heartburn, melena, nausea and vomiting.  Musculoskeletal: Positive for back pain, falls and joint pain. Negative for myalgias.  Skin: Negative for rash.  Neurological: Positive for dizziness and weakness. Negative for tingling, tremors, sensory change, speech change, focal weakness and loss of consciousness.  Endo/Heme/Allergies: Does not bruise/bleed easily.  Psychiatric/Behavioral: Negative for substance abuse. The patient is not nervous/anxious.   All other systems reviewed and are negative.    EKGs/Labs/Other Studies Reviewed:    Studies reviewed were summarized  above. The additional studies were reviewed today: As above.   EKG:  EKG is ordered today.  The EKG ordered today demonstrates NSR, 65 bpm, no acute ST-T changes  Recent Labs: 08/22/2019: BUN 27; Creatinine, Ser 1.13; Hemoglobin 10.0; Platelets 186; Potassium 4.2; Sodium 139 09/02/2019: ALT 15; TSH 3.090  Recent Lipid Panel    Component Value Date/Time   CHOL 134 04/05/2019 1039   TRIG 90 04/05/2019 1039   HDL 81 04/05/2019 1039   CHOLHDL 1.7 04/05/2019 1039   LDLCALC 36 04/05/2019 1039    PHYSICAL EXAM:    VS:  BP 120/60 (BP Location: Right Arm, Patient Position: Sitting, Cuff Size: Normal)   Pulse 65   Ht 5\' 5"  (1.651 m)   Wt 152 lb (68.9 kg)   SpO2 96%   BMI 25.29 kg/m   BMI: Body mass index is 25.29 kg/m.  Physical Exam Vitals reviewed.  Constitutional:      Appearance: She is well-developed.  HENT:     Head: Normocephalic and atraumatic.  Eyes:     General:        Right eye: No discharge.        Left eye: No discharge.  Neck:     Vascular: No JVD.  Cardiovascular:     Rate  and Rhythm: Normal rate and regular rhythm.     Pulses: No midsystolic click and no opening snap.          Posterior tibial pulses are 2+ on the right side and 2+ on the left side.     Heart sounds: Normal heart sounds, S1 normal and S2 normal. Heart sounds not distant. No murmur heard. No friction rub.  Pulmonary:     Effort: Pulmonary effort is normal. No respiratory distress.     Breath sounds: Normal breath sounds. No decreased breath sounds, wheezing or rales.  Chest:     Chest wall: No tenderness.  Abdominal:     General: There is no distension.     Palpations: Abdomen is soft.     Tenderness: There is no abdominal tenderness.  Musculoskeletal:     Cervical back: Normal range of motion.  Skin:    General: Skin is warm and dry.     Nails: There is no clubbing.  Neurological:     Mental Status: She is alert and oriented to person, place, and time.  Psychiatric:        Speech: Speech normal.        Behavior: Behavior normal.        Thought Content: Thought content normal.        Judgment: Judgment normal.     Wt Readings from Last 3 Encounters:  04/23/20 152 lb (68.9 kg)  03/16/20 150 lb (68 kg)  01/13/20 156 lb (70.8 kg)     ASSESSMENT & PLAN:   1. CAD involving the native coronary arteries with other forms of angina: Currently chest pain-free.  She does note a long history of left lateral chest discomfort that is primarily noted when laying down in the evening hours and nonexertional.  Symptoms are sublingual nitro responsive.  Schedule Lexiscan MPI to evaluate for high risk ischemia.  Otherwise she will continue current medical therapy including clopidogrel, Imdur, Crestor, and losartan.  2. Vasovagal syncope versus fall with gait instability: No further syncopal episodes.  Continue to ambulate with walker.  3. HLD: LDL 36 from 04/2019.  She remains on Crestor.  Check CMP, lipid panel, and direct LDL.  4.  Anemia: Check CBC given continued Plavix use.   Disposition:  F/u with Dr. Rockey Situ or an APP in 3 months.   Medication Adjustments/Labs and Tests Ordered: Current medicines are reviewed at length with the patient today.  Concerns regarding medicines are outlined above. Medication changes, Labs and Tests ordered today are summarized above and listed in the Patient Instructions accessible in Encounters.   Signed, Christell Faith, PA-C 04/23/2020 4:37 PM     [address] [place], [place] [address] [phone]

## 2020-04-23 ENCOUNTER — Ambulatory Visit: Payer: Medicare HMO | Admitting: Physician Assistant

## 2020-04-23 ENCOUNTER — Encounter: Payer: Self-pay | Admitting: Physician Assistant

## 2020-04-23 ENCOUNTER — Other Ambulatory Visit: Payer: Self-pay

## 2020-04-23 VITALS — BP 120/60 | HR 65 | Ht 65.0 in | Wt 152.0 lb

## 2020-04-23 DIAGNOSIS — R079 Chest pain, unspecified: Secondary | ICD-10-CM | POA: Diagnosis not present

## 2020-04-23 DIAGNOSIS — I25118 Atherosclerotic heart disease of native coronary artery with other forms of angina pectoris: Secondary | ICD-10-CM | POA: Diagnosis not present

## 2020-04-23 DIAGNOSIS — D649 Anemia, unspecified: Secondary | ICD-10-CM | POA: Diagnosis not present

## 2020-04-23 DIAGNOSIS — R2681 Unsteadiness on feet: Secondary | ICD-10-CM

## 2020-04-23 DIAGNOSIS — E785 Hyperlipidemia, unspecified: Secondary | ICD-10-CM

## 2020-04-23 DIAGNOSIS — R55 Syncope and collapse: Secondary | ICD-10-CM | POA: Diagnosis not present

## 2020-04-23 MED ORDER — ROSUVASTATIN CALCIUM 10 MG PO TABS: 10.0000 mg | ORAL_TABLET | Freq: Every day | ORAL | 3 refills | Status: DC

## 2020-04-23 MED ORDER — ISOSORBIDE MONONITRATE ER 30 MG PO TB24: 30.0000 mg | ORAL_TABLET | Freq: Every day | ORAL | 3 refills | Status: DC

## 2020-04-23 MED ORDER — LOSARTAN POTASSIUM 100 MG PO TABS: 100.0000 mg | ORAL_TABLET | Freq: Every day | ORAL | 3 refills | Status: DC

## 2020-04-23 MED ORDER — CLOPIDOGREL BISULFATE 75 MG PO TABS: 75.0000 mg | ORAL_TABLET | Freq: Every day | ORAL | 3 refills | Status: DC

## 2020-04-23 NOTE — Patient Instructions (Addendum)
Medication Instructions:  No changes. Refills sent in for your cardiac medications.   *If you need a refill on your cardiac medications before your next appointment, please call your pharmacy*   Lab Work: CMET, CBC, Lipid, and Direct LDL today  If you have labs (blood work) drawn today and your tests are completely normal, you will receive your results only by: Marland Kitchen MyChart Message (if you have MyChart) OR . A paper copy in the mail If you have any lab test that is abnormal or we need to change your treatment, we will call you to review the results.   Testing/Procedures: [place]  Your caregiver has ordered a Stress Test with nuclear imaging. The purpose of this test is to evaluate the blood supply to your heart muscle. This procedure is referred to as a "Non-Invasive Stress Test." This is because other than having an IV started in your vein, nothing is inserted or "invades" your body. Cardiac stress tests are done to find areas of poor blood flow to the heart by determining the extent of coronary artery disease (CAD). Some patients exercise on a treadmill, which naturally increases the blood flow to your heart, while others who are  unable to walk on a treadmill due to physical limitations have a pharmacologic/chemical stress agent called Lexiscan . This medicine will mimic walking on a treadmill by temporarily increasing your coronary blood flow.   Please note: these test may take anywhere between 2-4 hours to complete  PLEASE REPORT TO [facility] AT THE FIRST DESK WILL DIRECT YOU WHERE TO GO  Date of Procedure:_____________________________________  Arrival Time for Procedure:______________________________   PLEASE NOTIFY THE OFFICE AT LEAST 24 HOURS IN ADVANCE IF YOU ARE UNABLE TO KEEP YOUR APPOINTMENT.  [phone] AND  PLEASE NOTIFY NUCLEAR MEDICINE AT [facility] AT LEAST 24 HOURS IN ADVANCE IF YOU ARE UNABLE TO KEEP YOUR APPOINTMENT.  [phone]  How to prepare for your Myoview test:  1. Do not eat or drink after midnight 2. No caffeine for 24 hours prior to test 3. No smoking 24 hours prior to test. 4. Your medication may be taken with water.  If your doctor stopped a medication because of this test, do not take that medication. 5. Ladies, please do not wear dresses.  Skirts or pants are appropriate. Please wear a short sleeve shirt. 6. No perfume, cologne or lotion. 7. Wear comfortable walking shoes. No heels!   Follow-Up: At [facility], you and your health needs are our priority.  As part of our continuing mission to provide you with exceptional heart care, we have created designated Provider Care Teams.  These Care Teams include your primary Cardiologist (physician) and Advanced Practice Providers (APPs -  Physician Assistants and Nurse Practitioners) who all work together to provide you with the care you need, when you need it.  We recommend signing up for the patient portal called "MyChart".  Sign up information is provided on this After Visit Summary.  MyChart is used to connect with patients for Virtual Visits (Telemedicine).  Patients are able to view lab/test results, encounter notes, upcoming appointments, etc.  Non-urgent messages can be sent to your provider as well.   To learn more about what you can do with MyChart, go to [email or web address].    Your next appointment:   3 month(s)  The format for your next appointment:   In Person  Provider:   Ida Rogue, MD or Christell Faith, PA-C

## 2020-04-24 LAB — CBC
Hematocrit: 32.4 % — ABNORMAL LOW (ref 34.0–46.6)
Hemoglobin: 10.4 g/dL — ABNORMAL LOW (ref 11.1–15.9)
MCH: 29.7 pg (ref 26.6–33.0)
MCHC: 32.1 g/dL (ref 31.5–35.7)
MCV: 93 fL (ref 79–97)
Platelets: 235 10*3/uL (ref 150–450)
RBC: 3.5 x10E6/uL — ABNORMAL LOW (ref 3.77–5.28)
RDW: 15.3 % (ref 11.7–15.4)
WBC: 5 10*3/uL (ref 3.4–10.8)

## 2020-04-24 LAB — COMPREHENSIVE METABOLIC PANEL
ALT: 18 IU/L (ref 0–32)
AST: 32 IU/L (ref 0–40)
Albumin/Globulin Ratio: 1.3 (ref 1.2–2.2)
Albumin: 4.2 g/dL (ref 3.6–4.6)
Alkaline Phosphatase: 164 IU/L — ABNORMAL HIGH (ref 44–121)
BUN/Creatinine Ratio: 20 (ref 12–28)
BUN: 20 mg/dL (ref 8–27)
Bilirubin Total: 0.2 mg/dL (ref 0.0–1.2)
CO2: 21 mmol/L (ref 20–29)
Calcium: 9 mg/dL (ref 8.7–10.3)
Chloride: 104 mmol/L (ref 96–106)
Creatinine, Ser: 1.01 mg/dL — ABNORMAL HIGH (ref 0.57–1.00)
Globulin, Total: 3.2 g/dL (ref 1.5–4.5)
Glucose: 94 mg/dL (ref 65–99)
Potassium: 4.9 mmol/L (ref 3.5–5.2)
Sodium: 140 mmol/L (ref 134–144)
Total Protein: 7.4 g/dL (ref 6.0–8.5)
eGFR: 54 mL/min/{1.73_m2} — ABNORMAL LOW (ref >59)

## 2020-04-24 LAB — LIPID PANEL
Chol/HDL Ratio: 1.9 ratio (ref 0.0–4.4)
Cholesterol, Total: 159 mg/dL (ref 100–199)
HDL: 85 mg/dL (ref >39)
LDL Chol Calc (NIH): 58 mg/dL (ref 0–99)
Triglycerides: 88 mg/dL (ref 0–149)
VLDL Cholesterol Cal: 16 mg/dL (ref 5–40)

## 2020-04-24 LAB — LDL CHOLESTEROL, DIRECT: LDL Direct: 47 mg/dL (ref 0–99)

## 2020-05-07 ENCOUNTER — Other Ambulatory Visit: Payer: Self-pay | Admitting: Family Medicine

## 2020-05-07 DIAGNOSIS — M2550 Pain in unspecified joint: Secondary | ICD-10-CM

## 2020-05-14 ENCOUNTER — Encounter
Admission: RE | Admit: 2020-05-14 | Discharge: 2020-05-14 | Disposition: A | Discharge status: Home / Self Care | Payer: Medicare HMO | Source: Ambulatory Visit | Attending: Physician Assistant | Admitting: Physician Assistant

## 2020-05-14 ENCOUNTER — Other Ambulatory Visit: Payer: Self-pay

## 2020-05-14 DIAGNOSIS — I25118 Atherosclerotic heart disease of native coronary artery with other forms of angina pectoris: Secondary | ICD-10-CM

## 2020-05-14 LAB — NM MYOCAR MULTI W/SPECT W/WALL MOTION / EF
Estimated workload: 1 METS
Exercise duration (min): 0 min
Exercise duration (sec): 0 s
LV dias vol: 37 mL (ref 46–106)
LV sys vol: 17 mL
MPHR: 132 {beats}/min
Peak HR: 86 {beats}/min
Percent HR: 65 %
Rest HR: 64 {beats}/min
SDS: 1
SRS: 0
SSS: 0
TID: 1

## 2020-05-14 MED ORDER — REGADENOSON 0.4 MG/5ML IV SOLN
0.4000 mg | Freq: Once | INTRAVENOUS | Status: AC
  Administered 2020-05-14: 0.4 mg via INTRAVENOUS

## 2020-05-14 MED ORDER — TECHNETIUM TC 99M TETROFOSMIN IV KIT
10.4300 | PACK | Freq: Once | INTRAVENOUS | Status: AC | PRN
  Administered 2020-05-14: 10.43 via INTRAVENOUS

## 2020-05-14 MED ORDER — TECHNETIUM TC 99M TETROFOSMIN IV KIT
31.1800 | PACK | Freq: Once | INTRAVENOUS | Status: AC | PRN
  Administered 2020-05-14: 31.18 via INTRAVENOUS

## 2020-05-15 ENCOUNTER — Telehealth: Payer: Self-pay

## 2020-05-15 DIAGNOSIS — I2510 Atherosclerotic heart disease of native coronary artery without angina pectoris: Secondary | ICD-10-CM

## 2020-05-15 DIAGNOSIS — I25118 Atherosclerotic heart disease of native coronary artery with other forms of angina pectoris: Secondary | ICD-10-CM

## 2020-05-15 NOTE — Telephone Encounter (Signed)
Able to reach pt's daughter Hoyle Sauer regarding her mother's recent stress test, Christell Faith, PA-C had a chance to review her results and advised  "Stress test showed no blockages  On CT images, there was evidence of aortic plaque as well as hardening of the coronary arteries  Overall, low risk scan   Recommendations:  -Please schedule echo to further evaluate EF  -Cholesterol is well controlled  -Continue current medical therapy"  Daughter is pleased with the results, agreed to go ahead with echo, this was order and schedule. Additionally, all questions or concerns were address and no additional concerns at this time.   Echo 5/11 at 07:30am

## 2020-06-10 ENCOUNTER — Other Ambulatory Visit: Payer: Self-pay

## 2020-06-10 ENCOUNTER — Ambulatory Visit (INDEPENDENT_AMBULATORY_CARE_PROVIDER_SITE_OTHER): Payer: Medicare HMO

## 2020-06-10 DIAGNOSIS — I25118 Atherosclerotic heart disease of native coronary artery with other forms of angina pectoris: Secondary | ICD-10-CM

## 2020-06-10 DIAGNOSIS — I2510 Atherosclerotic heart disease of native coronary artery without angina pectoris: Secondary | ICD-10-CM | POA: Diagnosis not present

## 2020-06-10 LAB — ECHOCARDIOGRAM COMPLETE
AR max vel: 2.46 cm2
AV Area VTI: 2.76 cm2
AV Area mean vel: 2.6 cm2
AV Mean grad: 5 mmHg
AV Peak grad: 10 mmHg
Ao pk vel: 1.58 m/s
Area-P 1/2: 2.73 cm2
P 1/2 time: 482 msec
S' Lateral: 2.6 cm
Single Plane A4C EF: 58.4 %

## 2020-06-24 DIAGNOSIS — M1712 Unilateral primary osteoarthritis, left knee: Secondary | ICD-10-CM | POA: Diagnosis not present

## 2020-06-29 ENCOUNTER — Other Ambulatory Visit: Payer: Self-pay | Admitting: Family Medicine

## 2020-07-14 DIAGNOSIS — H524 Presbyopia: Secondary | ICD-10-CM | POA: Diagnosis not present

## 2020-07-30 ENCOUNTER — Other Ambulatory Visit: Payer: Self-pay | Admitting: Family Medicine

## 2020-07-30 DIAGNOSIS — M2550 Pain in unspecified joint: Secondary | ICD-10-CM

## 2020-07-30 NOTE — Telephone Encounter (Signed)
   Notes to clinic:  DX Code Needed  PT PREFERS 100MG  CELEBREX   Requested Prescriptions  Pending Prescriptions Disp Refills   celecoxib (CELEBREX) 100 MG capsule [Pharmacy Med Name: CELECOXIB 100 MG CAPSULE] 90 capsule 0    Sig: TAKE 1 CAPSULE BY MOUTH EVERY DAY      Analgesics:  COX2 Inhibitors Failed - 07/30/2020 10:34 AM      Failed - HGB in normal range and within 360 days    Hemoglobin  Date Value Ref Range Status  04/23/2020 10.4 (L) 11.1 - 15.9 g/dL Final          Failed - Cr in normal range and within 360 days    Creatinine, Ser  Date Value Ref Range Status  04/23/2020 1.01 (H) 0.57 - 1.00 mg/dL Final          Passed - Patient is not pregnant      Passed - Valid encounter within last 12 months    Recent Outpatient Visits           4 months ago Fall, initial encounter   [facility] Jerrol Banana., MD   6 months ago Urinary frequency   [facility] Jerrol Banana., MD   7 months ago Urinary frequency   [facility] Flinchum, Kelby Aline, FNP   11 months ago Annual physical exam   [facility] Jerrol Banana., MD   1 year ago Osteoporosis, unspecified osteoporosis type, unspecified pathological fracture presence   [facility] Jerrol Banana., MD       Future Appointments             In 5 days Gollan, Kathlene November, MD [facility], [place]

## 2020-08-04 ENCOUNTER — Other Ambulatory Visit: Payer: Self-pay

## 2020-08-04 ENCOUNTER — Ambulatory Visit: Payer: Medicare HMO | Admitting: Cardiovascular Disease

## 2020-08-04 ENCOUNTER — Encounter: Payer: Self-pay | Admitting: Cardiovascular Disease

## 2020-08-04 VITALS — BP 130/58 | HR 63 | Ht 65.0 in | Wt 150.0 lb

## 2020-08-04 DIAGNOSIS — I10 Essential (primary) hypertension: Secondary | ICD-10-CM | POA: Diagnosis not present

## 2020-08-04 DIAGNOSIS — I2510 Atherosclerotic heart disease of native coronary artery without angina pectoris: Secondary | ICD-10-CM | POA: Diagnosis not present

## 2020-08-04 DIAGNOSIS — E785 Hyperlipidemia, unspecified: Secondary | ICD-10-CM | POA: Diagnosis not present

## 2020-08-04 DIAGNOSIS — J432 Centrilobular emphysema: Secondary | ICD-10-CM | POA: Diagnosis not present

## 2020-08-04 DIAGNOSIS — E782 Mixed hyperlipidemia: Secondary | ICD-10-CM | POA: Diagnosis not present

## 2020-08-04 DIAGNOSIS — R55 Syncope and collapse: Secondary | ICD-10-CM

## 2020-08-04 DIAGNOSIS — I25118 Atherosclerotic heart disease of native coronary artery with other forms of angina pectoris: Secondary | ICD-10-CM

## 2020-08-04 DIAGNOSIS — D649 Anemia, unspecified: Secondary | ICD-10-CM | POA: Diagnosis not present

## 2020-08-04 NOTE — Progress Notes (Signed)
Cardiology Office Note  Date:  08/04/2020   ID:  Terri Braun, DOB 12-17-1929, MRN [id number]  PCP:  Jerrol Banana., MD   Chief Complaint  Patient presents with   Follow-up    Discuss cardiac testing. Patient c/o chest pain and shortness of breath at times. Medications reviewed by the patient verblaly.     HPI:  [name] is a 87 year old woman with past medical history of GERD COPD coronary artery disease  right coronary artery stent 2012 with repeat catheterization 2015 confirming patent stent Chronic SOB status post Nissen fundoplication,  liver disease  hepatitis ,  MZ alpha-1 antitrypsin phenotype, sessile serrated polyp of the cecum that has been biopsied in 2013 in 2017 but not removed.  moved from [facility] Who presents to establish for her coronary artery disease  LOV 04/2019   [facility] ED in 08/2019 with possible syncope versus fall.  Troponin was checked x1 and found to be normal.  EKG showed sinus rhythm with no acute ischemic changes.    CT head showed no evidence of acute intracranial abnormality with noted small chronic infarcts within the cerebellar hemispheres bilaterally and chronic small vessel ischemic disease.  UA consistent with UTI.  Hemoglobin was low though stable.  Symptoms were felt to be related to vasovagal near syncope and she was treated for UTI.   She comes in today accompanied by her daughter   Previously reported left-sided chest pain Underwent stress testing as detailed below 05/2020  Pharmacological myocardial perfusion imaging study with no significant  ischemia Normal wall motion, EF estimated at 93% No EKG changes concerning for ischemia at peak stress or in recovery. CT attenuation correction images with mild diffuse aortic atherosclerosis, Mild three-vessel coronary calcification Low risk scan  Echo reviewed in detail  1. Left ventricular ejection fraction, by estimation, is 60 to 65%. The  left ventricle  has normal function. The left ventricle has no regional  wall motion abnormalities. There is mild left ventricular hypertrophy.  Left ventricular diastolic parameters  are consistent with Grade I diastolic dysfunction (impaired relaxation).   2. Right ventricular systolic function is normal. The right ventricular  size is normal.   3. Left atrial size was mildly dilated.   4. The mitral valve is normal in structure. No evidence of mitral valve  regurgitation.   5. The aortic valve is tricuspid. Aortic valve regurgitation is mild.   6. The inferior vena cava is normal in size with greater than 50%  respiratory variability, suggesting right atrial pressure of 3 mmHg.    History of broken bones in right foot, chronic swelling, osteoporosis, uses walker and cane Afraid of falling, has had prior falls  EKG personally reviewed by myself on todays visit Shows normal sinus rhythm rate 63 bpm no significant ST or T wave changes   Echocardiogram reviewed from February 2017 showing normal LV function mild AI and MR  Cardiac catheterization January 06, 2014 showing patent proximal RCA stent 50% ostial narrowing followed by 30% in-stent restenosis, smaller PDA 40% disease 20 to 30% ostial left circumflex 20% up to 30% LAD disease   PMH:   has a past medical history of Anemia, ASCVD (arteriosclerotic cardiovascular disease), COPD (chronic obstructive pulmonary disease) ([place]), Diverticulosis, GERD (gastroesophageal reflux disease), Hypertension, and Hypothyroidism.  PSH:    Past Surgical History:  Procedure Laterality Date   APPENDECTOMY     BREAST SURGERY  1983   Lumpectomy   CHOLECYSTECTOMY     COLON  SURGERY     COLONOSCOPY  11/04/2015   CORONARY ANGIOPLASTY WITH STENT PLACEMENT     NISSEN FUNDOPLICATION     TOTAL ABDOMINAL HYSTERECTOMY     With BSO    Current Outpatient Medications  Medication Sig Dispense Refill   acetaminophen (TYLENOL) 500 MG tablet Take 500 mg by mouth every 4  (four) hours as needed.     alendronate (FOSAMAX) 70 MG tablet alendronate 70 mg tablet  TAKE 1 TABLET BY MOUTH ONCE WEEKLY     celecoxib (CELEBREX) 100 MG capsule TAKE 1 CAPSULE BY MOUTH EVERY DAY 90 capsule 0   clopidogrel (PLAVIX) 75 MG tablet Take 1 tablet (75 mg total) by mouth daily. 90 tablet 3   conjugated estrogens (PREMARIN) vaginal cream Apply one dab to Urethra daily at bedtime 42.5 g 12   DULoxetine (CYMBALTA) 60 MG capsule TAKE 1 CAPSULE BY MOUTH EVERY DAY 90 capsule 0   isosorbide mononitrate (IMDUR) 30 MG 24 hr tablet Take 1 tablet (30 mg total) by mouth daily. 90 tablet 3   lidocaine (LIDODERM) 5 % Place 1 patch onto the skin daily. Remove & Discard patch within 12 hours or as directed by MD 30 patch 0   losartan (COZAAR) 100 MG tablet Take 1 tablet (100 mg total) by mouth daily. 90 tablet 3   nitroGLYCERIN (NITROSTAT) 0.4 MG SL tablet Place 1 tablet (0.4 mg total) under the tongue every 5 (five) minutes as needed for chest pain. 25 tablet 3   rosuvastatin (CRESTOR) 10 MG tablet Take 1 tablet (10 mg total) by mouth daily. 90 tablet 3   No current facility-administered medications for this visit.     Allergies:   Shellfish allergy and Sulfa antibiotics   Social History:  The patient  reports that she has never smoked. She has never used smokeless tobacco. She reports that she does not drink alcohol and does not use drugs.   Family History:   family history includes Cancer in an other family member; Coronary artery disease in her mother; Crohn's disease in her son; Heart disease in her mother.    Review of Systems: Review of Systems  Constitutional: Negative.   HENT: Negative.    Respiratory: Negative.    Cardiovascular: Negative.   Gastrointestinal: Negative.   Musculoskeletal:  Positive for back pain.       Foot pain  Neurological: Negative.   Psychiatric/Behavioral: Negative.    All other systems reviewed and are negative.  PHYSICAL EXAM: VS:  BP (!) 130/58 (BP  Location: Left Arm, Patient Position: Sitting, Cuff Size: Normal)   Pulse 63   Ht 5\' 5"  (1.651 m)   Wt 150 lb (68 kg)   SpO2 98%   BMI 24.96 kg/m  , BMI Body mass index is 24.96 kg/m. Constitutional:  oriented to person, place, and time. No distress.  Presenting in a wheelchair HENT:  Head: Grossly normal Eyes:  no discharge. No scleral icterus.  Neck: No JVD, no carotid bruits  Cardiovascular: Regular rate and rhythm, no murmurs appreciated Pulmonary/Chest: Clear to auscultation bilaterally, no wheezes or rails Abdominal: Soft.  no distension.  no tenderness.  Musculoskeletal: Normal range of motion shoe/boot on the right foot Neurological:  normal muscle tone. Coordination normal. No atrophy Skin: Skin warm and dry Psychiatric: normal affect, pleasant  Recent Labs: 09/02/2019: TSH 3.090 04/23/2020: ALT 18; BUN 20; Creatinine, Ser 1.01; Hemoglobin 10.4; Platelets 235; Potassium 4.9; Sodium 140    Lipid Panel Lab Results  Component Value Date  CHOL 159 04/23/2020   HDL 85 04/23/2020   LDLCALC 58 04/23/2020   TRIG 88 04/23/2020    Wt Readings from Last 3 Encounters:  08/04/20 150 lb (68 kg)  04/23/20 152 lb (68.9 kg)  03/16/20 150 lb (68 kg)    ASSESSMENT AND PLAN:  Atherosclerosis of native coronary artery of native heart with stable angina pectoris [facility])  cardiac catheterization December 2015 Continue Crestor Cholesterol at goal Denies anginal symptoms Recent stress test no ischemia, echocardiogram reviewed normal ejection fraction, no further work-up needed  Centrilobular emphysema (HCC) Reports stable symptoms  Gastroesophageal reflux disease, esophagitis presence not specified Known polyp No dysphagia, or significant GERD symptoms  Anemia, unspecified type Hemoglobin stable, monitored by primary care  Gait instability Fractures in the feet, minimal weightbearing/wears a shoe History of falls  Fall, sequela Sedentary, no regular exercise program,  recommended she start a strengthening program for her legs    Total encounter time more than 25 minutes  Greater than 50% was spent in counseling and coordination of care with the patient   No orders of the defined types were placed in this encounter.    Signed, Esmond Plants, M.D., Ph.D. 08/04/2020  [place], [place]

## 2020-08-04 NOTE — Patient Instructions (Addendum)
Medication Instructions:  No changes, please continue your current medications   Consider an iron pill once a day (can purchase over the counter)  If you need a refill on your cardiac medications before your next appointment, please call your pharmacy.   Lab work: No new labs needed  Testing/Procedures: No new testing needed  Follow-Up: At [facility], you and your health needs are our priority.  As part of our continuing mission to provide you with exceptional heart care, we have created designated Provider Care Teams.  These Care Teams include your primary Cardiologist (physician) and Advanced Practice Providers (APPs -  Physician Assistants and Nurse Practitioners) who all work together to provide you with the care you need, when you need it.  You will need a follow up appointment in 12 months  Providers on your designated Care Team:   Murray Hodgkins, NP Christell Faith, PA-C Marrianne Mood, PA-C Cadence Salisbury, [place]  COVID-19 Vaccine Information can be found at: [email or web address] For questions related to vaccine distribution or appointments, please email [email or web address] or call [phone].

## 2020-08-31 ENCOUNTER — Ambulatory Visit: Payer: Self-pay

## 2020-08-31 NOTE — Telephone Encounter (Signed)
Pt reports tested positive covid at facility, 08/28/20. States she is asymptomatic. States "No symptoms whatsoever. " Reviewed all possible symptoms associated with covid, denies. Pt calling to ensure she could end self isolation after 5 days from positive test as told to her by facility. Advised that information was correct, she should  then wear a well fitted mask for additional 5 days. Advised to monitor for symptoms and CB if needed. Pt verbalizes understanding.

## 2020-08-31 NOTE — Telephone Encounter (Signed)
Patient tested positive for COVID 19, via at home test, Friday 08/28/20   The patient shares that they are feeling well and experiencing no symptoms currently   The patient would like to be contacted by a member of staff to discuss medical advise and if they should take any medication   Please contact further when possible  Reason for Disposition . COVID-19 Home Isolation, questions about  Answer Assessment - Initial Assessment Questions 1. COVID-19 DIAGNOSIS: "Who made your COVID-19 diagnosis?" "Was it confirmed by a positive lab test or self-test?" If not diagnosed by a doctor (or NP/PA), ask "Are there lots of cases (community spread) where you live?" Note: See public health department website, if unsure.     08/28/20 PCR 2. COVID-19 EXPOSURE: "Was there any known exposure to [place] before the symptoms began?" CDC Definition of close contact: within 6 feet (2 meters) for a total of 15 minutes or more over a 24-hour period.      In facility 3. ONSET: "When did the COVID-19 symptoms start?"      None 4. WORST SYMPTOM: "What is your worst symptom?" (e.g., cough, fever, shortness of breath, muscle aches)     *No Answer* 5. COUGH: "Do you have a cough?" If Yes, ask: "How bad is the cough?"       *No Answer* 6. FEVER: "Do you have a fever?" If Yes, ask: "What is your temperature, how was it measured, and when did it start?"     *No Answer* 7. RESPIRATORY STATUS: "Describe your breathing?" (e.g., shortness of breath, wheezing, unable to speak)      *No Answer* 8. BETTER-SAME-WORSE: "Are you getting better, staying the same or getting worse compared to yesterday?"  If getting worse, ask, "In what way?"     *No Answer* 9. HIGH RISK DISEASE: "Do you have any chronic medical problems?" (e.g., asthma, heart or lung disease, weak immune system, obesity, etc.)     *No Answer* 10. VACCINE: "Have you had the COVID-19 vaccine?" If Yes, ask: "Which one, how many shots, when did you get it?"       *No  Answer* 11. BOOSTER: "Have you received your COVID-19 booster?" If Yes, ask: "Which one and when did you get it?"       *No Answer*  13. OTHER SYMPTOMS: "Do you have any other symptoms?"  (e.g., chills, fatigue, headache, loss of smell or taste, muscle pain, sore throat)       *No Answer* 14. O2 SATURATION MONITOR:  "Do you use an oxygen saturation monitor (pulse oximeter) at home?" If Yes, ask "What is your reading (oxygen level) today?" "What is your usual oxygen saturation reading?" (e.g., 95%)       *No Answer*  Protocols used: Coronavirus (COVID-19) Diagnosed or Suspected-A-AH

## 2020-09-01 NOTE — Telephone Encounter (Signed)
Pt's daughter advised.   Thanks,   -Mickel Baas

## 2020-09-01 NOTE — Telephone Encounter (Signed)
FYI

## 2020-09-01 NOTE — Telephone Encounter (Signed)
She needs to call if she develops any symptoms whatsoever within the next 2-3 days, in which case she should take an antiviral medications

## 2020-09-03 DIAGNOSIS — M1712 Unilateral primary osteoarthritis, left knee: Secondary | ICD-10-CM | POA: Diagnosis not present

## 2020-09-15 ENCOUNTER — Other Ambulatory Visit: Payer: Self-pay

## 2020-09-15 ENCOUNTER — Encounter: Payer: Self-pay | Admitting: Family Medicine

## 2020-09-15 ENCOUNTER — Ambulatory Visit (INDEPENDENT_AMBULATORY_CARE_PROVIDER_SITE_OTHER): Payer: Medicare HMO | Admitting: Family Medicine

## 2020-09-15 VITALS — BP 116/96 | HR 67 | Temp 98.8°F | Resp 16 | Wt 152.0 lb

## 2020-09-15 DIAGNOSIS — R2681 Unsteadiness on feet: Secondary | ICD-10-CM

## 2020-09-15 DIAGNOSIS — I10 Essential (primary) hypertension: Secondary | ICD-10-CM

## 2020-09-15 DIAGNOSIS — E039 Hypothyroidism, unspecified: Secondary | ICD-10-CM

## 2020-09-15 DIAGNOSIS — Z0000 Encounter for general adult medical examination without abnormal findings: Secondary | ICD-10-CM

## 2020-09-15 DIAGNOSIS — I2510 Atherosclerotic heart disease of native coronary artery without angina pectoris: Secondary | ICD-10-CM

## 2020-09-15 DIAGNOSIS — E8801 Alpha-1-antitrypsin deficiency: Secondary | ICD-10-CM

## 2020-09-15 DIAGNOSIS — J432 Centrilobular emphysema: Secondary | ICD-10-CM | POA: Diagnosis not present

## 2020-09-15 DIAGNOSIS — M810 Age-related osteoporosis without current pathological fracture: Secondary | ICD-10-CM | POA: Diagnosis not present

## 2020-09-15 DIAGNOSIS — Z955 Presence of coronary angioplasty implant and graft: Secondary | ICD-10-CM

## 2020-09-15 DIAGNOSIS — M170 Bilateral primary osteoarthritis of knee: Secondary | ICD-10-CM

## 2020-09-15 NOTE — Progress Notes (Signed)
Annual Wellness Visit     Patient: Terri Braun, Female    DOB: Jan 30, 1930, 86 y.o.   MRN: [id number] Visit Date: 09/15/2020  Today's Provider: [name], MD   Chief Complaint  Patient presents with   Annual Exam   Subjective    Terri Braun is a 87 y.o. female who presents today for her Annual Wellness Visit. She reports consuming a general diet. The patient does not participate in regular exercise at present. She generally feels well. She reports sleeping fairly well. She does not have additional problems to discuss today.  She has her ninth grandchild on the way.  She is in good spirit. She has chronic knee pain and uses topical treatment for that.  She needs a total knee replacement. She has been on Fosamax for 1 year for osteoporosis. She needs refill on Anoro and Trelegy     Medications: Outpatient Medications Prior to Visit  Medication Sig   acetaminophen (TYLENOL) 500 MG tablet Take 500 mg by mouth every 4 (four) hours as needed.   alendronate (FOSAMAX) 70 MG tablet alendronate 70 mg tablet  TAKE 1 TABLET BY MOUTH ONCE WEEKLY   celecoxib (CELEBREX) 100 MG capsule TAKE 1 CAPSULE BY MOUTH EVERY DAY   clopidogrel (PLAVIX) 75 MG tablet Take 1 tablet (75 mg total) by mouth daily.   conjugated estrogens (PREMARIN) vaginal cream Apply one dab to Urethra daily at bedtime   DULoxetine (CYMBALTA) 60 MG capsule TAKE 1 CAPSULE BY MOUTH EVERY DAY   isosorbide mononitrate (IMDUR) 30 MG 24 hr tablet Take 1 tablet (30 mg total) by mouth daily.   lidocaine (LIDODERM) 5 % Place 1 patch onto the skin daily. Remove & Discard patch within 12 hours or as directed by MD   losartan (COZAAR) 100 MG tablet Take 1 tablet (100 mg total) by mouth daily.   nitroGLYCERIN (NITROSTAT) 0.4 MG SL tablet Place 1 tablet (0.4 mg total) under the tongue every 5 (five) minutes as needed for chest pain.   rosuvastatin (CRESTOR) 10 MG tablet Take 1 tablet (10 mg total) by mouth daily.   No  facility-administered medications prior to visit.    Allergies  Allergen Reactions   Shellfish Allergy Anaphylaxis    Ended up in the ED after eating shellfish, caused diarrhea and severe GI upset , unsure if caused SOB   Sulfa Antibiotics     Patient Care Team: Jerrol Banana., MD as PCP - General (Family Medicine) Rockey Situ Kathlene November, MD as PCP - Cardiology (Cardiology)  Review of Systems  All other systems reviewed and are negative.       Objective    Vitals: BP (!) 116/96   Pulse 67   Temp 98.8 F (37.1 C)   Resp 16   Wt 152 lb (68.9 kg)   BMI 25.29 kg/m  BP Readings from Last 3 Encounters:  09/15/20 (!) 116/96  08/04/20 (!) 130/58  04/23/20 120/60   Wt Readings from Last 3 Encounters:  09/15/20 152 lb (68.9 kg)  08/04/20 150 lb (68 kg)  04/23/20 152 lb (68.9 kg)      Physical Exam Vitals reviewed.  Constitutional:      Appearance: Normal appearance. She is normal weight.  HENT:     Head: Normocephalic and atraumatic.     Right Ear: Tympanic membrane, ear canal and external ear normal.     Left Ear: Tympanic membrane, ear canal and external ear normal.     Nose:  Nose normal.     Mouth/Throat:     Mouth: Mucous membranes are moist.     Pharynx: Oropharynx is clear.  Eyes:     Extraocular Movements: Extraocular movements intact.     Conjunctiva/sclera: Conjunctivae normal.     Pupils: Pupils are equal, round, and reactive to light.  Cardiovascular:     Rate and Rhythm: Normal rate and regular rhythm.     Pulses: Normal pulses.     Heart sounds: Normal heart sounds.  Pulmonary:     Effort: Pulmonary effort is normal.     Breath sounds: Normal breath sounds.  Chest:  Breasts:    Right: Normal.     Left: Normal.  Abdominal:     Palpations: Abdomen is soft.     Tenderness: There is no abdominal tenderness.  Musculoskeletal:     Cervical back: Normal range of motion and neck supple.     Comments: Mild thoracic kyphosis  Lymphadenopathy:      Upper Body:     Right upper body: No supraclavicular or axillary adenopathy.     Left upper body: No supraclavicular or axillary adenopathy.  Skin:    General: Skin is warm and dry.  Neurological:     General: No focal deficit present.     Mental Status: She is alert and oriented to person, place, and time.  Psychiatric:        Mood and Affect: Mood normal.        Behavior: Behavior normal.        Thought Content: Thought content normal.        Judgment: Judgment normal.     Most recent functional status assessment: In your present state of health, do you have any difficulty performing the following activities: 09/15/2020  Hearing? Y  Vision? Y  Difficulty concentrating or making decisions? N  Walking or climbing stairs? Y  Dressing or bathing? -  Doing errands, shopping? Y  Some recent data might be hidden   Most recent fall risk assessment: Fall Risk  09/15/2020  Falls in the past year? 0  Number falls in past yr: 0  Comment -  Injury with Fall? 0  Comment -  Risk for fall due to : Impaired balance/gait  Follow up Falls evaluation completed    Most recent depression screenings: PHQ 2/9 Scores 09/15/2020 03/16/2020  PHQ - 2 Score 0 3  PHQ- 9 Score 1 8   Most recent cognitive screening: 6CIT Screen 09/15/2020  What Year? 0 points  What month? 0 points  What time? 0 points  Count back from 20 0 points  Months in reverse 0 points  Repeat phrase 0 points  Total Score 0   Most recent Audit-C alcohol use screening Alcohol Use Disorder Test (AUDIT) 09/15/2020  1. How often do you have a drink containing alcohol? 0  2. How many drinks containing alcohol do you have on a typical day when you are drinking? 0  3. How often do you have six or more drinks on one occasion? 0  AUDIT-C Score 0  Alcohol Brief Interventions/Follow-up -   A score of 3 or more in women, and 4 or more in men indicates increased risk for alcohol abuse, EXCEPT if all of the points are from question 1    No results found for any visits on 09/15/20.  Assessment & Plan     Annual wellness visit done today including the all of the following: Reviewed patient's Family Medical History Reviewed  and updated list of patient's medical providers Assessment of cognitive impairment was done Assessed patient's functional ability Established a written schedule for health screening [place] Completed and Reviewed  Exercise Activities and Dietary recommendations  Goals   None     Immunization History  Administered Date(s) Administered   Fluad Quad(high Dose 65+) 10/24/2018   Influenza Split 11/01/2013   Influenza, High Dose Seasonal PF 11/03/2017   Influenza,inj,Quad PF,6+ Mos 12/05/2014   Influenza-Unspecified 10/14/2015, 10/28/2016, 10/31/2016   Pneumococcal Conjugate-13 07/14/2014   Pneumococcal Polysaccharide-23 07/29/2015    Health Maintenance  Topic Date Due   COVID-19 Vaccine (1) Never done   TETANUS/TDAP  Never done   Zoster Vaccines- Shingrix (1 of 2) Never done   DEXA SCAN  Never done   INFLUENZA VACCINE  08/31/2020   PNA vac Low Risk Adult  Completed   HPV VACCINES  Aged Out   1. Medicare annual wellness visit, subsequent   2. Annual physical exam An 65 patient wishes no screening exams.  I am in agreement with this.  3. Osteoporosis, unspecified osteoporosis type, unspecified pathological fracture presence On Fosamax for 1 year now.  Continue for another 4 years. - DG Bone Density; Future - DG Bone Density  4. Essential hypertension, benign   5. ASCVD (arteriosclerotic cardiovascular disease)   6. Heterozygous alpha 1-antitrypsin deficiency ([place])   7. Centrilobular emphysema (HCC) Refill Anoro and Trelegy  8. Primary hypothyroidism   9. Status post coronary artery stent placement All risk factors treated  10. Gait instability Fall risk is present but patient and daughter are aware.  11. Primary osteoarthritis of both  knees Using topicals.  She would be high risk total knee replacement but may need to consider just for her quality of life.   Discussed health benefits of physical activity, and encouraged her to engage in regular exercise appropriate for her age and condition.      No follow-ups on file.     I, [name], MD, have reviewed all documentation for this visit. The documentation on 09/21/20 for the exam, diagnosis, procedures, and orders are all accurate and complete.    Penda Venturi Cranford Mon, MD  [facility] [phone] (phone) [phone] (fax)  [place]

## 2020-10-06 ENCOUNTER — Telehealth: Payer: Self-pay | Admitting: Family Medicine

## 2020-10-06 NOTE — Telephone Encounter (Signed)
Pt's daughter Hoyle Sauer called in to update provider. She says that pt was given 2 sample inhalers at her previous visit and was told to let provider know which works better for pt.    Pt would like to go ahead with Trelegy. She would like to have Rx sent in to    Pharmacy:  [id number][address]:  [phone] Fax:  [phone]

## 2020-10-07 ENCOUNTER — Other Ambulatory Visit: Payer: Self-pay

## 2020-10-07 MED ORDER — TRELEGY ELLIPTA 100-62.5-25 MCG/INH IN AEPB: 1.0000 | INHALATION_SPRAY | Freq: Every day | RESPIRATORY_TRACT | 12 refills | Status: DC

## 2020-10-07 NOTE — Telephone Encounter (Signed)
Ok to refill 

## 2020-10-07 NOTE — Telephone Encounter (Signed)
Rx sent to pharmacy   

## 2020-10-08 ENCOUNTER — Other Ambulatory Visit: Payer: Self-pay | Admitting: Family Medicine

## 2020-10-08 DIAGNOSIS — M2550 Pain in unspecified joint: Secondary | ICD-10-CM

## 2020-10-19 DIAGNOSIS — H2512 Age-related nuclear cataract, left eye: Secondary | ICD-10-CM | POA: Diagnosis not present

## 2020-10-19 DIAGNOSIS — H2511 Age-related nuclear cataract, right eye: Secondary | ICD-10-CM | POA: Diagnosis not present

## 2020-11-04 ENCOUNTER — Ambulatory Visit
Admission: RE | Admit: 2020-11-04 | Discharge: 2020-11-04 | Disposition: A | Discharge status: Home / Self Care | Payer: Medicare HMO | Source: Ambulatory Visit | Attending: Family Medicine | Admitting: Family Medicine

## 2020-11-04 ENCOUNTER — Other Ambulatory Visit: Payer: Self-pay

## 2020-11-04 DIAGNOSIS — M810 Age-related osteoporosis without current pathological fracture: Secondary | ICD-10-CM | POA: Insufficient documentation

## 2020-11-04 DIAGNOSIS — Z780 Asymptomatic menopausal state: Secondary | ICD-10-CM | POA: Diagnosis not present

## 2020-11-04 DIAGNOSIS — M85852 Other specified disorders of bone density and structure, left thigh: Secondary | ICD-10-CM | POA: Diagnosis not present

## 2020-11-25 DIAGNOSIS — Z01818 Encounter for other preprocedural examination: Secondary | ICD-10-CM | POA: Diagnosis not present

## 2020-11-25 DIAGNOSIS — H2511 Age-related nuclear cataract, right eye: Secondary | ICD-10-CM | POA: Diagnosis not present

## 2020-12-07 DIAGNOSIS — S92324A Nondisplaced fracture of second metatarsal bone, right foot, initial encounter for closed fracture: Secondary | ICD-10-CM | POA: Diagnosis not present

## 2020-12-07 DIAGNOSIS — M1712 Unilateral primary osteoarthritis, left knee: Secondary | ICD-10-CM | POA: Diagnosis not present

## 2021-01-31 DIAGNOSIS — R52 Pain, unspecified: Secondary | ICD-10-CM | POA: Diagnosis not present

## 2021-01-31 DIAGNOSIS — I10 Essential (primary) hypertension: Secondary | ICD-10-CM | POA: Diagnosis not present

## 2021-02-03 DIAGNOSIS — H2512 Age-related nuclear cataract, left eye: Secondary | ICD-10-CM | POA: Diagnosis not present

## 2021-02-03 DIAGNOSIS — H25812 Combined forms of age-related cataract, left eye: Secondary | ICD-10-CM | POA: Diagnosis not present

## 2021-02-03 DIAGNOSIS — Z01818 Encounter for other preprocedural examination: Secondary | ICD-10-CM | POA: Diagnosis not present

## 2021-03-19 NOTE — Progress Notes (Deleted)
Established patient visit   Patient: Terri Braun   DOB: Jan 07, 1930   88 y.o. Female  MRN: [id number] Visit Date: 03/22/2021  Today's healthcare provider: [name], MD   No chief complaint on file.  Subjective    HPI  Hypertension, follow-up  BP Readings from Last 3 Encounters:  09/15/20 (!) 116/96  08/04/20 (!) 130/58  04/23/20 120/60   Wt Readings from Last 3 Encounters:  09/15/20 152 lb (68.9 kg)  08/04/20 150 lb (68 kg)  04/23/20 152 lb (68.9 kg)     She was last seen for hypertension 6 months ago.  BP at that visit was 116/96.  Management since that visit includes; on Imdur and losartan.  She reports {excellent/good/fair/poor:19665} compliance with treatment. She {is/is not:9024} having side effects. {document side effects if present:1} She is following a {diet:21022986} diet. She {is/is not:9024} exercising. She {does/does not:200015} smoke.  Use of agents associated with hypertension: {bp agents assoc with hypertension:511::"none"}.   Outside blood pressures are {***enter patient reported home BP readings, or 'not being checked':1}.  Pertinent labs: Lab Results  Component Value Date   CHOL 159 04/23/2020   HDL 85 04/23/2020   LDLCALC 58 04/23/2020   LDLDIRECT 47 04/23/2020   TRIG 88 04/23/2020   CHOLHDL 1.9 04/23/2020   Lab Results  Component Value Date   NA 140 04/23/2020   K 4.9 04/23/2020   CREATININE 1.01 (H) 04/23/2020   EGFR 54 (L) 04/23/2020   GLUCOSE 94 04/23/2020   TSH 3.090 09/02/2019     The ASCVD Risk score (Arnett DK, et al., 2019) failed to calculate for the following reasons:   The 2019 ASCVD risk score is only valid for ages 41 to 21   --------------------------------------------------------------------------------------------------   Medications: Outpatient Medications Prior to Visit  Medication Sig   acetaminophen (TYLENOL) 500 MG tablet Take 500 mg by mouth every 4 (four) hours as needed.   alendronate  (FOSAMAX) 70 MG tablet alendronate 70 mg tablet  TAKE 1 TABLET BY MOUTH ONCE WEEKLY   celecoxib (CELEBREX) 100 MG capsule TAKE 1 CAPSULE BY MOUTH EVERY DAY   clopidogrel (PLAVIX) 75 MG tablet Take 1 tablet (75 mg total) by mouth daily.   conjugated estrogens (PREMARIN) vaginal cream Apply one dab to Urethra daily at bedtime   DULoxetine (CYMBALTA) 60 MG capsule TAKE 1 CAPSULE BY MOUTH EVERY DAY   Fluticasone-Umeclidin-Vilant (TRELEGY ELLIPTA) 100-62.5-25 MCG/INH AEPB Inhale 1 puff into the lungs daily.   isosorbide mononitrate (IMDUR) 30 MG 24 hr tablet Take 1 tablet (30 mg total) by mouth daily.   lidocaine (LIDODERM) 5 % Place 1 patch onto the skin daily. Remove & Discard patch within 12 hours or as directed by MD   losartan (COZAAR) 100 MG tablet Take 1 tablet (100 mg total) by mouth daily.   nitroGLYCERIN (NITROSTAT) 0.4 MG SL tablet Place 1 tablet (0.4 mg total) under the tongue every 5 (five) minutes as needed for chest pain.   rosuvastatin (CRESTOR) 10 MG tablet Take 1 tablet (10 mg total) by mouth daily.   No facility-administered medications prior to visit.    Review of Systems  Constitutional:  Negative for appetite change, chills, fatigue and fever.  Respiratory:  Negative for chest tightness and shortness of breath.   Cardiovascular:  Negative for chest pain and palpitations.  Gastrointestinal:  Negative for abdominal pain, nausea and vomiting.  Neurological:  Negative for dizziness and weakness.   {Labs   Heme   Chem  Endocrine   Serology   Results Review (optional):23779}   Objective    There were no vitals taken for this visit. {Show previous vital signs (optional):23777}  Physical Exam  ***  No results found for any visits on 03/22/21.  Assessment & Plan     ***  No follow-ups on file.      {provider attestation***:1}   [name], MD  [facility] [phone] (phone) [phone] (fax)  [place]

## 2021-03-22 ENCOUNTER — Ambulatory Visit: Payer: Medicare HMO | Admitting: Family Medicine

## 2021-03-25 ENCOUNTER — Other Ambulatory Visit: Payer: Self-pay

## 2021-03-25 ENCOUNTER — Ambulatory Visit (INDEPENDENT_AMBULATORY_CARE_PROVIDER_SITE_OTHER): Payer: Medicare PPO | Admitting: Family Medicine

## 2021-03-25 ENCOUNTER — Encounter: Payer: Self-pay | Admitting: Family Medicine

## 2021-03-25 VITALS — BP 114/56 | HR 68 | Temp 98.0°F | Resp 16 | Ht 65.0 in | Wt 142.0 lb

## 2021-03-25 DIAGNOSIS — N952 Postmenopausal atrophic vaginitis: Secondary | ICD-10-CM

## 2021-03-25 DIAGNOSIS — I10 Essential (primary) hypertension: Secondary | ICD-10-CM

## 2021-03-25 DIAGNOSIS — E782 Mixed hyperlipidemia: Secondary | ICD-10-CM

## 2021-03-25 DIAGNOSIS — E039 Hypothyroidism, unspecified: Secondary | ICD-10-CM | POA: Diagnosis not present

## 2021-03-25 DIAGNOSIS — I2510 Atherosclerotic heart disease of native coronary artery without angina pectoris: Secondary | ICD-10-CM | POA: Diagnosis not present

## 2021-03-25 DIAGNOSIS — R300 Dysuria: Secondary | ICD-10-CM | POA: Diagnosis not present

## 2021-03-25 DIAGNOSIS — R55 Syncope and collapse: Secondary | ICD-10-CM

## 2021-03-25 DIAGNOSIS — M159 Polyosteoarthritis, unspecified: Secondary | ICD-10-CM

## 2021-03-25 MED ORDER — PREMARIN 0.625 MG/GM VA CREA: TOPICAL_CREAM | VAGINAL | 5 refills | Status: DC

## 2021-03-25 MED ORDER — LOSARTAN POTASSIUM 50 MG PO TABS: 50.0000 mg | ORAL_TABLET | Freq: Every day | ORAL | 3 refills | Status: DC

## 2021-03-25 MED ORDER — DULOXETINE HCL 60 MG PO CPEP: ORAL_CAPSULE | ORAL | 1 refills | Status: DC

## 2021-03-25 NOTE — Progress Notes (Signed)
Established patient visit  I,[name],acting as a scribe for [name], MD.,have documented all relevant documentation on the behalf of [name], MD,as directed by  [name], MD while in the presence of [name], MD.   Patient: Terri Braun   DOB: [birth year]   88 y.o. Female  MRN: [id number] Visit Date: 03/25/2021  Today's healthcare provider: [name], MD   Chief Complaint  Patient presents with   Follow-up   Hypertension   Hyperlipidemia   Subjective    HPI  Patient is having some lightheaded spells recently.  She feels as though her blood pressure is getting low.  Main complaint is 1 of chronic pain and mild depression.  She is getting along well in assisted living.  She is brought in by her daughter today. She is having mild dysuria recently. Hypertension, follow-up  BP Readings from Last 3 Encounters:  03/25/21 (!) 114/56  09/15/20 (!) 116/96  08/04/20 (!) 130/58   Wt Readings from Last 3 Encounters:  03/25/21 142 lb (64.4 kg)  09/15/20 152 lb (68.9 kg)  08/04/20 150 lb (68 kg)     She was last seen for hypertension 6 months ago.  BP at that visit was 116/96. Management since that visit includes; on losartan. She reports good compliance with treatment. She is not having side effects. none She is not exercising. She is adherent to low salt diet.   Outside blood pressures are not checking.  She does not smoke.  Use of agents associated with hypertension: none.   --------------------------------------------------------------------------------------------------- Lipid/Cholesterol, follow-up  Last Lipid Panel: Lab Results  Component Value Date   CHOL 159 04/23/2020   LDLCALC 58 04/23/2020   LDLDIRECT 47 04/23/2020   HDL 85 04/23/2020   TRIG 88 04/23/2020    She was last seen for this 11 months ago.  Management since that visit includes; on rosuvastatin.  She reports good compliance with  treatment. She is not having side effects. none  She is following a Regular, Low Sodium diet. Current exercise: none  Last metabolic panel Lab Results  Component Value Date   GLUCOSE 94 04/23/2020   NA 140 04/23/2020   K 4.9 04/23/2020   BUN 20 04/23/2020   CREATININE 1.01 (H) 04/23/2020   EGFR 54 (L) 04/23/2020   GFRNONAA 44 (L) 08/22/2019   CALCIUM 9.0 04/23/2020   AST 32 04/23/2020   ALT 18 04/23/2020   The ASCVD Risk score (Arnett DK, et al., 2019) failed to calculate for the following reasons:   The 2019 ASCVD risk score is only valid for ages 32 to 98  ---------------------------------------------------------------------------------------------------   Medications: Outpatient Medications Prior to Visit  Medication Sig   acetaminophen (TYLENOL) 500 MG tablet Take 500 mg by mouth every 4 (four) hours as needed.   alendronate (FOSAMAX) 70 MG tablet alendronate 70 mg tablet  TAKE 1 TABLET BY MOUTH ONCE WEEKLY   clopidogrel (PLAVIX) 75 MG tablet Take 1 tablet (75 mg total) by mouth daily.   Fluticasone-Umeclidin-Vilant (TRELEGY ELLIPTA) 100-62.5-25 MCG/INH AEPB Inhale 1 puff into the lungs daily.   isosorbide mononitrate (IMDUR) 30 MG 24 hr tablet Take 1 tablet (30 mg total) by mouth daily.   lidocaine (LIDODERM) 5 % Place 1 patch onto the skin daily. Remove & Discard patch within 12 hours or as directed by MD   nitroGLYCERIN (NITROSTAT) 0.4 MG SL tablet Place 1 tablet (0.4 mg total) under the tongue every 5 (five) minutes  as needed for chest pain.   rosuvastatin (CRESTOR) 10 MG tablet Take 1 tablet (10 mg total) by mouth daily.   [DISCONTINUED] conjugated estrogens (PREMARIN) vaginal cream Apply one dab to Urethra daily at bedtime   [DISCONTINUED] DULoxetine (CYMBALTA) 60 MG capsule TAKE 1 CAPSULE BY MOUTH EVERY DAY   [DISCONTINUED] losartan (COZAAR) 100 MG tablet Take 1 tablet (100 mg total) by mouth daily.   celecoxib (CELEBREX) 100 MG capsule TAKE 1 CAPSULE BY MOUTH  EVERY DAY (Patient not taking: Reported on 03/25/2021)   No facility-administered medications prior to visit.    Review of Systems  Constitutional:  Negative for appetite change, chills, fatigue and fever.  Respiratory:  Negative for chest tightness and shortness of breath.   Cardiovascular:  Negative for chest pain and palpitations.  Gastrointestinal:  Negative for abdominal pain, nausea and vomiting.  Neurological:  Negative for dizziness and weakness.   Last lipids Lab Results  Component Value Date   CHOL 150 03/26/2021   HDL 79 03/26/2021   LDLCALC 52 03/26/2021   LDLDIRECT 47 04/23/2020   TRIG 105 03/26/2021   CHOLHDL 1.9 03/26/2021       Objective    BP (!) 114/56 (BP Location: Right Arm, Patient Position: Sitting, Cuff Size: Normal)    Pulse 68    Temp 98 F (36.7 C) (Temporal)    Resp 16    Ht '5\' 5"'  (1.651 m)    Wt 142 lb (64.4 kg)    SpO2 96%    BMI 23.63 kg/m  BP Readings from Last 3 Encounters:  03/25/21 (!) 114/56  09/15/20 (!) 116/96  08/04/20 (!) 130/58   Wt Readings from Last 3 Encounters:  03/25/21 142 lb (64.4 kg)  09/15/20 152 lb (68.9 kg)  08/04/20 150 lb (68 kg)      Physical Exam Vitals reviewed.  Constitutional:      Appearance: Normal appearance.  HENT:     Head: Normocephalic and atraumatic.     Mouth/Throat:     Pharynx: Oropharynx is clear.  Eyes:     Conjunctiva/sclera: Conjunctivae normal.  Cardiovascular:     Rate and Rhythm: Normal rate and regular rhythm.     Pulses: Normal pulses.     Heart sounds: Normal heart sounds. No murmur heard.   No friction rub. No gallop.  Pulmonary:     Effort: Pulmonary effort is normal. No respiratory distress.     Breath sounds: Normal breath sounds. No stridor. No wheezing, rhonchi or rales.  Chest:     Chest wall: No tenderness.  Abdominal:     Palpations: Abdomen is soft.     Tenderness: There is no right CVA tenderness, left CVA tenderness, guarding or rebound.  Musculoskeletal:      Cervical back: Normal range of motion and neck supple.  Skin:    General: Skin is warm and dry.  Neurological:     Mental Status: She is alert. Mental status is at baseline.     Motor: No weakness.     Gait: Gait normal.  Psychiatric:        Mood and Affect: Mood normal.        Behavior: Behavior normal.        Thought Content: Thought content normal.        Judgment: Judgment normal.      No results found for any visits on 03/25/21.  Assessment & Plan     1. Essential hypertension, benign Decreased losartan from 100 mg to  50 mg qd. - Lipid panel - TSH - CBC w/Diff/Platelet - Comprehensive Metabolic Panel (CMET) - losartan (COZAAR) 50 MG tablet; Take 1 tablet (50 mg total) by mouth daily.  Dispense: 90 tablet; Refill: 3  2. Mixed hyperlipidemia On rosuvastatin 10 - Lipid panel - TSH - CBC w/Diff/Platelet - Comprehensive Metabolic Panel (CMET)  3. Primary hypothyroidism  - Lipid panel - TSH - CBC w/Diff/Platelet - Comprehensive Metabolic Panel (CMET)  4. Coronary artery disease, unspecified vessel or lesion type, unspecified whether angina present, unspecified whether native or transplanted heart All risk factors treated - Lipid panel - TSH - CBC w/Diff/Platelet - Comprehensive Metabolic Panel (CMET)  5. Atrophic vaginitis  - conjugated estrogens (PREMARIN) vaginal cream; Apply one dab to Urethra daily at bedtime  Dispense: 42.5 g; Refill: 5  6. Primary osteoarthritis involving multiple joints Restarted Cymbalta 60 mg qd. - DULoxetine (CYMBALTA) 60 MG capsule; TAKE 1 CAPSULE BY MOUTH EVERY DAY  Dispense: 90 capsule; Refill: 1  7. Pre-syncope  - Lipid panel - TSH - CBC w/Diff/Platelet - Comprehensive Metabolic Panel (CMET)  8. Dysuria Given 3 days of nitrofurantoin 100 mg bid. - POCT urinalysis dipstick - CULTURE, URINE COMPREHENSIVE - nitrofurantoin, macrocrystal-monohydrate, (MACROBID) 100 MG capsule; Take 1 capsule (100 mg total) by mouth 2 (two)  times daily.  Dispense: 6 capsule; Refill: 0    Return in about 3 months (around 06/22/2021).      I, [name], MD, have reviewed all documentation for this visit. The documentation on 03/29/21 for the exam, diagnosis, procedures, and orders are all accurate and complete.    Daniell Mancinas Cranford Mon, MD  [facility] [phone] (phone) [phone] (fax)  [place]

## 2021-03-26 ENCOUNTER — Telehealth: Payer: Self-pay | Admitting: *Deleted

## 2021-03-26 LAB — POCT URINALYSIS DIPSTICK
Bilirubin, UA: NEGATIVE
Blood, UA: NEGATIVE
Glucose, UA: NEGATIVE
Ketones, UA: POSITIVE
Nitrite, UA: NEGATIVE
Protein, UA: NEGATIVE
Spec Grav, UA: 1.02 (ref 1.010–1.025)
Urobilinogen, UA: 0.2 E.U./dL
pH, UA: 6 (ref 5.0–8.0)

## 2021-03-26 MED ORDER — NITROFURANTOIN MONOHYD MACRO 100 MG PO CAPS: 100.0000 mg | ORAL_CAPSULE | Freq: Two times a day (BID) | ORAL | 0 refills | Status: DC

## 2021-03-26 NOTE — Telephone Encounter (Signed)
Pt's daughter, Hoyle Sauer given results from April, [place] and states she would go pick up abx.

## 2021-03-26 NOTE — Telephone Encounter (Signed)
LMOVM for Terri Braun to return call. The urine sample she drop off this morning was positive for large leukocytes. We sent three days of antibiotic to pharmacy. Also sent urine to lab for culture. Okay for pec triage to advise pt's daughter. Thank you.

## 2021-03-27 LAB — COMPREHENSIVE METABOLIC PANEL
ALT: 9 IU/L (ref 0–32)
AST: 17 IU/L (ref 0–40)
Albumin/Globulin Ratio: 1.3 (ref 1.2–2.2)
Albumin: 4 g/dL (ref 3.6–4.6)
Alkaline Phosphatase: 109 IU/L (ref 44–121)
BUN/Creatinine Ratio: 16 (ref 12–28)
BUN: 18 mg/dL (ref 8–27)
Bilirubin Total: 0.2 mg/dL (ref 0.0–1.2)
CO2: 23 mmol/L (ref 20–29)
Calcium: 9 mg/dL (ref 8.7–10.3)
Chloride: 105 mmol/L (ref 96–106)
Creatinine, Ser: 1.1 mg/dL — ABNORMAL HIGH (ref 0.57–1.00)
Globulin, Total: 3.1 g/dL (ref 1.5–4.5)
Glucose: 101 mg/dL — ABNORMAL HIGH (ref 70–99)
Potassium: 4.1 mmol/L (ref 3.5–5.2)
Sodium: 141 mmol/L (ref 134–144)
Total Protein: 7.1 g/dL (ref 6.0–8.5)
eGFR: 48 mL/min/{1.73_m2} — ABNORMAL LOW (ref >59)

## 2021-03-27 LAB — CBC WITH DIFFERENTIAL/PLATELET
Basophils Absolute: 0 10*3/uL (ref 0.0–0.2)
Basos: 1 %
EOS (ABSOLUTE): 0.1 10*3/uL (ref 0.0–0.4)
Eos: 2 %
Hematocrit: 30.3 % — ABNORMAL LOW (ref 34.0–46.6)
Hemoglobin: 9.6 g/dL — ABNORMAL LOW (ref 11.1–15.9)
Immature Grans (Abs): 0 10*3/uL (ref 0.0–0.1)
Immature Granulocytes: 0 %
Lymphocytes Absolute: 0.6 10*3/uL — ABNORMAL LOW (ref 0.7–3.1)
Lymphs: 12 %
MCH: 29.6 pg (ref 26.6–33.0)
MCHC: 31.7 g/dL (ref 31.5–35.7)
MCV: 94 fL (ref 79–97)
Monocytes Absolute: 0.5 10*3/uL (ref 0.1–0.9)
Monocytes: 9 %
Neutrophils Absolute: 4.1 10*3/uL (ref 1.4–7.0)
Neutrophils: 76 %
Platelets: 232 10*3/uL (ref 150–450)
RBC: 3.24 x10E6/uL — ABNORMAL LOW (ref 3.77–5.28)
RDW: 14.6 % (ref 11.7–15.4)
WBC: 5.3 10*3/uL (ref 3.4–10.8)

## 2021-03-27 LAB — LIPID PANEL
Chol/HDL Ratio: 1.9 ratio (ref 0.0–4.4)
Cholesterol, Total: 150 mg/dL (ref 100–199)
HDL: 79 mg/dL (ref >39)
LDL Chol Calc (NIH): 52 mg/dL (ref 0–99)
Triglycerides: 105 mg/dL (ref 0–149)
VLDL Cholesterol Cal: 19 mg/dL (ref 5–40)

## 2021-03-27 LAB — TSH: TSH: 2.91 u[IU]/mL (ref 0.450–4.500)

## 2021-03-31 ENCOUNTER — Telehealth: Payer: Self-pay

## 2021-03-31 LAB — CULTURE, URINE COMPREHENSIVE

## 2021-03-31 NOTE — Telephone Encounter (Signed)
Copied from [place] [id number]. Topic: General - Call Back - No Documentation ?>> Mar 31, 2021  2:25 PM Erick Blinks wrote: ?Reason for CRM: Pt's daughter called to discuss lab results, please advise if P[facility] may disclose  ? ?Best contact: [phone] ?

## 2021-04-01 ENCOUNTER — Other Ambulatory Visit: Payer: Self-pay | Admitting: Family Medicine

## 2021-04-01 NOTE — Telephone Encounter (Signed)
Requested medication (s) are due for refill today: yes ? ?Requested medication (s) are on the active medication list: yes ? ?Last refill:  04/23/20 #90 3 refills ? ?Future visit scheduled: yes in 2 months ? ?Notes to clinic:  last ordered by Areta Haber. Dunn, PA-C. Do you want to refill Rx? ? ? ?  ?Requested Prescriptions  ?Pending Prescriptions Disp Refills  ? isosorbide mononitrate (IMDUR) 30 MG 24 hr tablet [Pharmacy Med Name: ISOSORBIDE MONONIT ER 30 MG TB] 90 tablet 3  ?  Sig: TAKE 1 TABLET BY MOUTH EVERY DAY  ?  ? Cardiovascular:  Nitrates Passed - 04/01/2021  5:27 PM  ?  ?  Passed - Last BP in normal range  ?  BP Readings from Last 1 Encounters:  ?03/25/21 (!) 114/56  ?  ?  ?  ?  Passed - Last Heart Rate in normal range  ?  Pulse Readings from Last 1 Encounters:  ?03/25/21 68  ?  ?  ?  ?  Passed - Valid encounter within last 12 months  ?  Recent Outpatient Visits   ? ?      ? 1 week ago Essential hypertension, benign  ? [facility] Jerrol Banana., MD  ? 6 months ago Medicare annual wellness visit, subsequent  ? [facility] Jerrol Banana., MD  ? 1 year ago Fall, initial encounter  ? [facility] Jerrol Banana., MD  ? 1 year ago Urinary frequency  ? [facility] Jerrol Banana., MD  ? 1 year ago Urinary frequency  ? [place], FNP  ? ?  ?  ?Future Appointments   ? ?        ? In 2 months Jerrol Banana., MD [facility], [facility]  ? ?  ? ?  ?  ?  ? ?

## 2021-04-12 DIAGNOSIS — M1712 Unilateral primary osteoarthritis, left knee: Secondary | ICD-10-CM | POA: Insufficient documentation

## 2021-04-20 ENCOUNTER — Other Ambulatory Visit: Payer: Self-pay

## 2021-04-20 ENCOUNTER — Emergency Department: Payer: Medicare PPO

## 2021-04-20 ENCOUNTER — Emergency Department
Admission: EM | Admit: 2021-04-20 | Discharge: 2021-04-20 | Disposition: A | Discharge status: Home / Self Care | Payer: Medicare PPO | Attending: Emergency Medicine | Admitting: Emergency Medicine

## 2021-04-20 ENCOUNTER — Encounter: Payer: Self-pay | Admitting: Emergency Medicine

## 2021-04-20 ENCOUNTER — Ambulatory Visit: Payer: Self-pay | Admitting: *Deleted

## 2021-04-20 DIAGNOSIS — I10 Essential (primary) hypertension: Secondary | ICD-10-CM | POA: Diagnosis not present

## 2021-04-20 DIAGNOSIS — M79605 Pain in left leg: Secondary | ICD-10-CM | POA: Insufficient documentation

## 2021-04-20 DIAGNOSIS — S8012XA Contusion of left lower leg, initial encounter: Secondary | ICD-10-CM | POA: Diagnosis not present

## 2021-04-20 DIAGNOSIS — J449 Chronic obstructive pulmonary disease, unspecified: Secondary | ICD-10-CM | POA: Diagnosis not present

## 2021-04-20 DIAGNOSIS — I2510 Atherosclerotic heart disease of native coronary artery without angina pectoris: Secondary | ICD-10-CM | POA: Insufficient documentation

## 2021-04-20 DIAGNOSIS — S8992XA Unspecified injury of left lower leg, initial encounter: Secondary | ICD-10-CM | POA: Diagnosis present

## 2021-04-20 DIAGNOSIS — Y92009 Unspecified place in unspecified non-institutional (private) residence as the place of occurrence of the external cause: Secondary | ICD-10-CM | POA: Insufficient documentation

## 2021-04-20 DIAGNOSIS — W1839XA Other fall on same level, initial encounter: Secondary | ICD-10-CM | POA: Diagnosis not present

## 2021-04-20 NOTE — ED Provider Notes (Signed)
? ?[facility] ?Provider Note ? ? ? Event Date/Time  ? First MD Initiated Contact with Patient 04/20/21 1252   ?  (approximate) ? ? ?History  ? ?Leg Pain and Fall ? ? ?HPI ? ?Terri Braun is a 88 y.o. female with a history of hypertension, COPD, CAD, and osteoporosis, and arthritis who presents with an area of swelling and pain to her left leg after a fall several days ago.  The patient states that she has relatively frequent falls, usually due to losing her balance.  She fell 5 days ago at home and initially declined to come into the hospital for evaluation.  She has had no other falls since then and has been ambulatory with her walker.  She states that she did not hit her head or lose consciousness.  She denies any neck or back pain, or injuries to her other extremities. ? ?  ? ? ?Physical Exam  ? ?Triage Vital Signs: ?ED Triage Vitals  ?Enc Vitals Group  ?   BP 04/20/21 1152 (!) 144/62  ?   Pulse Rate 04/20/21 1152 72  ?   Resp 04/20/21 1152 18  ?   Temp 04/20/21 1152 99.1 ?F (37.3 ?C)  ?   Temp Source 04/20/21 1152 Oral  ?   SpO2 04/20/21 1152 94 %  ?   Weight 04/20/21 1222 141 lb 15.6 oz (64.4 kg)  ?   Height 04/20/21 1152 '5\' 5"'$  (1.651 m)  ?   Head Circumference --   ?   Peak Flow --   ?   Pain Score 04/20/21 1152 5  ?   Pain Loc --   ?   Pain Edu? --   ?   Excl. in [place]? --   ? ? ?Most recent vital signs: ?Vitals:  ? 04/20/21 1152  ?BP: (!) 144/62  ?Pulse: 72  ?Resp: 18  ?Temp: 99.1 ?F (37.3 ?C)  ?SpO2: 94%  ? ? ? ?General: Alert and oriented, somewhat frail appearing but in no acute distress.  ?CV:  Good peripheral perfusion.  ?Resp:  Normal effort.  ?Abd:  No distention.  ?Other:  Left lower leg with approximately 5 x 10 cm superficial hematoma medially, subacute appearing, minimally tender.  No bony tenderness.  Full range of motion at the hip, knee, and ankle.  2+ DP pulse.  Cap refill less than 2 seconds.  Motor intact in all extremities.  Normal coordination. ? ? ?ED Results /  Procedures / Treatments  ? ?Labs ?(all labs ordered are listed, but only abnormal results are displayed) ?Labs Reviewed - No data to display ? ? ?EKG ? ? ? ? ?RADIOLOGY ? ?US venous LLE: I independently viewed and interpreted the images.  There is no evidence of acute DVT ? ?PROCEDURES: ? ?Critical Care performed: No ? ?Procedures ? ? ?MEDICATIONS ORDERED IN ED: ?Medications - No data to display ? ? ?IMPRESSION / MDM / ASSESSMENT AND PLAN / ED COURSE  ?I reviewed the triage vital signs and the nursing notes. ? ?88 year old female with PMH as noted above presents with an area of pain to the left lower leg after fall several days ago.  She reports relatively frequent falls, however this has not changed in the last few days.  She currently does not feel dizzy or weak.  Per the daughter, the patient was evaluated by her PMD several weeks ago.  I reviewed the past medical records and confirmed that she was seen by Dr. Rosanna Randy on  2/23 and had lab work-up which was reassuring. ? ?On exam the patient is well-appearing for her age.  Her vital signs are normal.  She has a superficial hematoma to the left lower leg in the area of the pain.  There are no other significant injuries.  Neurologic exam is nonfocal. ? ?Ultrasound shows no evidence of DVT in the left lower extremity.  There is no evidence clinically of bony injury or indication for further imaging. ? ?I had an extensive discussion with the patient and her daughter about her recent falls and whether she needs any additional work-up in the ED.  The patient states that she feels fine today and just wanted to get the leg checked out.  She denies any acute change in her symptoms, and she and the daughter declined additional work-up today.  They would like to follow-up with the PMD.  I feel that this is reasonable.  I gave them thorough return precautions and they expressed understanding. ? ? ?FINAL CLINICAL IMPRESSION(S) / ED DIAGNOSES  ? ?Final diagnoses:  ?Leg hematoma,  left, initial encounter  ? ? ? ?Rx / DC Orders  ? ?ED Discharge Orders   ? ? None  ? ?  ? ? ? ?Note:  This document was prepared using Dragon voice recognition software and may include unintentional dictation errors.  ?  Arta Silence, MD ?04/20/21 1453 ? ?

## 2021-04-20 NOTE — Telephone Encounter (Signed)
?  Chief Complaint: Left calf pain, bruising S/P fall ?Symptoms: [place] Friday 04/16/21, declined ED. Now with Left calf pain, discoloration, "Hard area in bruise." Fell due to dizziness. "Sore all over." Unsure how long on floor. ?Pt on Plavix. ?Frequency: Friday 04/16/21 ?Pertinent Negatives: Patient denies  ?Disposition: '[x]'$ ED /'[]'$ Urgent Care (no appt availability in office) / '[]'$ Appointment(In office/virtual)/ '[]'$  [place] [facility]/ '[]'$ Home Care/ '[]'$ Refused Recommended Disposition /'[]'$ [place] [facility]/ '[]'$  Follow-up with PCP ?Additional Notes: Pt's daughter, on [place], states will follow disposition. ? Reason for Disposition ? [1] Unable to get up until help (e.g., caregiver, family, friend) arrived AND [2] on the ground 1 hour or more ? ?Answer Assessment - Initial Assessment Questions ?1. MECHANISM: "How did the fall happen?" ?    Dizzy ?2. DOMESTIC VIOLENCE AND ELDER ABUSE SCREENING: "Did you fall because someone pushed you or tried to hurt you?" If Yes, ask: "Are you safe now?" ?    no ?3. ONSET: "When did the fall happen?" (e.g., minutes, hours, or days ago) ?    Friday 04/16/21 ?4. LOCATION: "What part of the body hit the ground?" (e.g., back, buttocks, head, hips, knees, hands, head, stomach) ?    "Slid to floor" ?5. INJURY: "Did you hurt (injure) yourself when you fell?" If Yes, ask: "What did you injure? Tell me more about this?" (e.g., body area; type of injury; pain severity)" ?    Sore "All over" ?6. PAIN: "Is there any pain?" If Yes, ask: "How bad is the pain?" (e.g., Scale 1-10; or mild,  ?moderate, severe) ?  - NONE (0): No pain ?  - MILD (1-3): Doesn't interfere with normal activities  ?  - MODERATE (4-7): Interferes with normal activities or awakens from sleep  ?  - SEVERE (8-10): Excruciating pain, unable to do any normal activities  ?    Unsure ?7. SIZE: For cuts, bruises, or swelling, ask: "How large is it?" (e.g., inches or centimeters)  ?    Bruised area left calf, hardened area, painful ? ?9.  OTHER SYMPTOMS: "Do you have any other symptoms?" (e.g., dizziness, fever, weakness; new onset or worsening).  ?    No ?10. CAUSE: "What do you think caused the fall (or falling)?" (e.g., tripped, dizzy spell) ?      Dizzy ? ?Protocols used: Falls and Falling-A-AH ? ?

## 2021-04-20 NOTE — ED Triage Notes (Addendum)
Pt to ED via POV with c/o a fall on Thursday, EMS came out to the house and cleared her, she has continued to have pain in her left lower leg and has a knot on the inner thigh with bruising. She is able to bear weight, walks with a walker with slow steady gait.  ?She reports that she was standing a sink and holding her walker and passed out. She reports that she has done this before. Denies hitting head or being on blood thinners.  ?

## 2021-04-20 NOTE — ED Notes (Signed)
67 yof with a c/c of left lower leg pain as a result of an accidental fall.  ?

## 2021-04-20 NOTE — Discharge Instructions (Signed)
Return to the ER for new, worsening, or persistent falls, dizziness or weakness, difficulty walking, or any other new or worsening symptoms that concern you. ? ?The hematoma in your leg will gradually get better.  If you have swelling or pain you can elevate it or put ice on it. ?

## 2021-04-30 ENCOUNTER — Encounter: Payer: Self-pay | Admitting: Physician Assistant

## 2021-04-30 ENCOUNTER — Ambulatory Visit
Admission: RE | Admit: 2021-04-30 | Discharge: 2021-04-30 | Disposition: A | Discharge status: Home / Self Care | Payer: Medicare PPO | Source: Ambulatory Visit | Attending: Physician Assistant | Admitting: Physician Assistant

## 2021-04-30 ENCOUNTER — Ambulatory Visit
Admission: RE | Admit: 2021-04-30 | Discharge: 2021-04-30 | Disposition: A | Discharge status: Home / Self Care | Payer: Medicare PPO | Attending: Physician Assistant | Admitting: Physician Assistant

## 2021-04-30 ENCOUNTER — Ambulatory Visit (INDEPENDENT_AMBULATORY_CARE_PROVIDER_SITE_OTHER): Payer: Medicare PPO | Admitting: Physician Assistant

## 2021-04-30 VITALS — BP 127/46 | HR 70 | Ht 65.0 in | Wt 142.5 lb

## 2021-04-30 DIAGNOSIS — I10 Essential (primary) hypertension: Secondary | ICD-10-CM

## 2021-04-30 DIAGNOSIS — W19XXXA Unspecified fall, initial encounter: Secondary | ICD-10-CM | POA: Diagnosis not present

## 2021-04-30 DIAGNOSIS — M25562 Pain in left knee: Secondary | ICD-10-CM | POA: Insufficient documentation

## 2021-04-30 DIAGNOSIS — Y939 Activity, unspecified: Secondary | ICD-10-CM | POA: Diagnosis not present

## 2021-04-30 DIAGNOSIS — R55 Syncope and collapse: Secondary | ICD-10-CM

## 2021-04-30 DIAGNOSIS — S8012XA Contusion of left lower leg, initial encounter: Secondary | ICD-10-CM | POA: Diagnosis not present

## 2021-04-30 MED ORDER — LOSARTAN POTASSIUM 25 MG PO TABS: 25.0000 mg | ORAL_TABLET | Freq: Every day | ORAL | 1 refills | Status: DC

## 2021-04-30 NOTE — Progress Notes (Signed)
? ?I,Sha'taria Tyson,acting as a Education administrator for [employer], PA-C.,have documented all relevant documentation on the behalf of Mikey Kirschner, PA-C,as directed by  Mikey Kirschner, PA-C while in the presence of Mikey Kirschner, PA-C. ? ?Acute Office Visit ? ?Subjective:  ? ? Patient ID: Terri Braun, female    DOB: 12-14-1929, 88 y.o.   MRN: [id number] ? ?Cc. Left leg swelling ? ?Terri Braun is a 88 y/o female who comes from an assisted living facility with her daughter for the evaluation of L leg swelling/pain.  ?Two weeks ago she fell, unwittnessed. She states she 'passed out', which she does occasionally. Reports landing on her right side, denies hitting her head. She presented to the ED 3/21, d/t left leg swelling DVT was r/o. No images or head CT was done.  ? ?She reports the swelling/bruising is improving, but still feels a knot on her left leg and is worried the bleeding will break the skin. Denies difficulty with ambulation.  ? ?Past Medical History:  ?Diagnosis Date  ? Anemia   ? ASCVD (arteriosclerotic cardiovascular disease)   ? COPD (chronic obstructive pulmonary disease) ([place])   ? Diverticulosis   ? GERD (gastroesophageal reflux disease)   ? Hypertension   ? Hypothyroidism   ? ? ?Past Surgical History:  ?Procedure Laterality Date  ? APPENDECTOMY    ? BREAST SURGERY  1983  ? Lumpectomy  ? CHOLECYSTECTOMY    ? COLON SURGERY    ? COLONOSCOPY  11/04/2015  ? CORONARY ANGIOPLASTY WITH STENT PLACEMENT    ? NISSEN FUNDOPLICATION    ? TOTAL ABDOMINAL HYSTERECTOMY    ? With BSO  ? ? ?Family History  ?Problem Relation Age of Onset  ? Coronary artery disease Mother   ? Heart disease Mother   ? Crohn's disease Son   ? Cancer Other   ?     other family member with breast cancer  ? ? ?Social History  ? ?Socioeconomic History  ? Marital status: Widowed  ?  Spouse name: Not on file  ? Number of children: Not on file  ? Years of education: Not on file  ? Highest education level: Not on file  ?Occupational History  ? Not on  file  ?Tobacco Use  ? Smoking status: Never  ? Smokeless tobacco: Never  ?Vaping Use  ? Vaping Use: Never used  ?Substance and Sexual Activity  ? Alcohol use: Never  ? Drug use: Never  ? Sexual activity: Not on file  ?Other Topics Concern  ? Not on file  ?Social History Narrative  ? She is widowed she has sons and daughters, she moved here from [place] to live in [place] near her daughter  ? 2 caffeinated beverages a day  ? Rare alcohol  ? Never smoker  ? ?Social Determinants of Health  ? ?Financial Resource Strain: Not on file  ?Food Insecurity: Not on file  ?Transportation Needs: Not on file  ?Physical Activity: Not on file  ?Stress: Not on file  ?Social Connections: Not on file  ?Intimate Partner Violence: Not on file  ? ? ?Outpatient Medications Prior to Visit  ?Medication Sig Dispense Refill  ? acetaminophen (TYLENOL) 500 MG tablet Take 500 mg by mouth every 4 (four) hours as needed.    ? alendronate (FOSAMAX) 70 MG tablet alendronate 70 mg tablet ? TAKE 1 TABLET BY MOUTH ONCE WEEKLY    ? celecoxib (CELEBREX) 100 MG capsule TAKE 1 CAPSULE BY MOUTH EVERY DAY (Patient not taking: Reported on 03/25/2021)  90 capsule 0  ? clopidogrel (PLAVIX) 75 MG tablet Take 1 tablet (75 mg total) by mouth daily. 90 tablet 3  ? conjugated estrogens (PREMARIN) vaginal cream Apply one dab to Urethra daily at bedtime 42.5 g 5  ? DULoxetine (CYMBALTA) 60 MG capsule TAKE 1 CAPSULE BY MOUTH EVERY DAY 90 capsule 1  ? Fluticasone-Umeclidin-Vilant (TRELEGY ELLIPTA) 100-62.5-25 MCG/INH AEPB Inhale 1 puff into the lungs daily. 60 each 12  ? isosorbide mononitrate (IMDUR) 30 MG 24 hr tablet TAKE 1 TABLET BY MOUTH EVERY DAY 90 tablet 3  ? lidocaine (LIDODERM) 5 % Place 1 patch onto the skin daily. Remove & Discard patch within 12 hours or as directed by MD 30 patch 0  ? losartan (COZAAR) 50 MG tablet Take 1 tablet (50 mg total) by mouth daily. 90 tablet 3  ? nitrofurantoin, macrocrystal-monohydrate, (MACROBID) 100 MG  capsule Take 1 capsule (100 mg total) by mouth 2 (two) times daily. 6 capsule 0  ? nitroGLYCERIN (NITROSTAT) 0.4 MG SL tablet Place 1 tablet (0.4 mg total) under the tongue every 5 (five) minutes as needed for chest pain. 25 tablet 3  ? rosuvastatin (CRESTOR) 10 MG tablet Take 1 tablet (10 mg total) by mouth daily. 90 tablet 3  ? ?No facility-administered medications prior to visit.  ? ? ?Allergies  ?Allergen Reactions  ? Shellfish Allergy Anaphylaxis  ?  Ended up in the ED after eating shellfish, caused diarrhea and severe GI upset , unsure if caused SOB  ? Sulfa Antibiotics   ? ? ?Review of Systems  ?Constitutional:  Negative for fatigue and fever.  ?Respiratory:  Negative for cough and shortness of breath.   ?Cardiovascular:  Negative for chest pain and leg swelling.  ?Gastrointestinal:  Negative for abdominal pain.  ?Musculoskeletal:  Positive for arthralgias and joint swelling.  ?Skin:  Positive for color change.  ?Neurological:  Negative for dizziness and headaches.  ? ?   ?Objective:  ?  ?Physical Exam ?Constitutional:   ?   General: She is awake.  ?   Appearance: She is well-developed.  ?HENT:  ?   Head: Normocephalic.  ?Eyes:  ?   Conjunctiva/sclera: Conjunctivae normal.  ?Cardiovascular:  ?   Rate and Rhythm: Normal rate and regular rhythm.  ?   Heart sounds: Normal heart sounds.  ?Pulmonary:  ?   Effort: Pulmonary effort is normal.  ?   Breath sounds: Normal breath sounds.  ?Musculoskeletal:  ?   Comments: Large hematoma left medial calf.  ?L knee with significant edema and tenderness to the left tibial tuberosity.   ?Skin: ?   General: Skin is warm.  ?Neurological:  ?   Mental Status: She is alert and oriented to person, place, and time.  ?Psychiatric:     ?   Attention and Perception: Attention normal.     ?   Mood and Affect: Mood normal.     ?   Speech: Speech normal.     ?   Behavior: Behavior is cooperative.  ? ? ?There were no vitals taken for this visit. ?Wt Readings from Last 3 Encounters:   ?04/20/21 141 lb 15.6 oz (64.4 kg)  ?03/25/21 142 lb (64.4 kg)  ?09/15/20 152 lb (68.9 kg)  ? ? ?Health Maintenance Due  ?Topic Date Due  ? COVID-19 Vaccine (1) Never done  ? TETANUS/TDAP  Never done  ? Zoster Vaccines- Shingrix (1 of 2) Never done  ? INFLUENZA VACCINE  08/31/2020  ? ? ?There are no preventive  care reminders to display for this patient. ? ? ?Lab Results  ?Component Value Date  ? TSH 2.910 03/26/2021  ? ?Lab Results  ?Component Value Date  ? WBC 5.3 03/26/2021  ? HGB 9.6 (L) 03/26/2021  ? HCT 30.3 (L) 03/26/2021  ? MCV 94 03/26/2021  ? PLT 232 03/26/2021  ? ?Lab Results  ?Component Value Date  ? NA 141 03/26/2021  ? K 4.1 03/26/2021  ? CO2 23 03/26/2021  ? GLUCOSE 101 (H) 03/26/2021  ? BUN 18 03/26/2021  ? CREATININE 1.10 (H) 03/26/2021  ? BILITOT 0.2 03/26/2021  ? ALKPHOS 109 03/26/2021  ? AST 17 03/26/2021  ? ALT 9 03/26/2021  ? PROT 7.1 03/26/2021  ? ALBUMIN 4.0 03/26/2021  ? CALCIUM 9.0 03/26/2021  ? ANIONGAP 7 08/22/2019  ? EGFR 48 (L) 03/26/2021  ? ?Lab Results  ?Component Value Date  ? CHOL 150 03/26/2021  ? ?Lab Results  ?Component Value Date  ? HDL 79 03/26/2021  ? ?Lab Results  ?Component Value Date  ? [place] 52 03/26/2021  ? ?Lab Results  ?Component Value Date  ? TRIG 105 03/26/2021  ? ?Lab Results  ?Component Value Date  ? CHOLHDL 1.9 03/26/2021  ? ?No results found for: HGBA1C ? ?   ?Assessment & Plan:  ? ?Problem List Items Addressed This Visit   ? ?  ? Cardiovascular and Mediastinum  ? Essential hypertension, benign  ?  Unclear etiology of syncope, d/t low pressure today will further reduce losartan 50 to losartan 25 mg. May be able d/c completely.  ?Close follow up ?  ?  ? Relevant Medications  ? losartan (COZAAR) 25 MG tablet  ?  ? Other  ? Leg hematoma, left, initial encounter - Primary  ?  Resolving. Advised ice, elevation, topical aquaphor/soft bandage over inferior portion where there is some thin skin.  ? ?D/t edema and tenderness above hematoma, will eval with xrays to r/o  fracture. Lower suspicion as she is ambulating without difficulty.  ? ?  ?  ? Syncope  ?  May be secondary to hypotension, close follow up recommended may need cardio w/u ?  ?  ? ?Other Visit Diagnoses   ? ? Acute pai

## 2021-05-03 ENCOUNTER — Encounter: Payer: Self-pay | Admitting: Physician Assistant

## 2021-05-03 DIAGNOSIS — R55 Syncope and collapse: Secondary | ICD-10-CM | POA: Insufficient documentation

## 2021-05-03 DIAGNOSIS — S8012XA Contusion of left lower leg, initial encounter: Secondary | ICD-10-CM | POA: Insufficient documentation

## 2021-05-03 NOTE — Assessment & Plan Note (Signed)
Unclear etiology of syncope, d/t low pressure today will further reduce losartan 50 to losartan 25 mg. May be able d/c completely.  ?Close follow up ?

## 2021-05-03 NOTE — Assessment & Plan Note (Addendum)
Resolving. Advised ice, elevation, topical aquaphor/soft bandage over inferior portion where there is some thin skin.  ? ?D/t edema and tenderness above hematoma, will eval with xrays to r/o fracture. Lower suspicion as she is ambulating without difficulty.  ? ?

## 2021-05-03 NOTE — Assessment & Plan Note (Signed)
May be secondary to hypotension, close follow up recommended may need cardio w/u ?

## 2021-06-20 ENCOUNTER — Other Ambulatory Visit: Payer: Self-pay | Admitting: Family Medicine

## 2021-06-22 ENCOUNTER — Encounter: Payer: Self-pay | Admitting: Family Medicine

## 2021-06-22 ENCOUNTER — Ambulatory Visit (INDEPENDENT_AMBULATORY_CARE_PROVIDER_SITE_OTHER): Payer: Medicare PPO | Admitting: Family Medicine

## 2021-06-22 VITALS — BP 151/61 | HR 72 | Temp 97.8°F | Resp 18 | Ht 65.0 in | Wt 139.0 lb

## 2021-06-22 DIAGNOSIS — I10 Essential (primary) hypertension: Secondary | ICD-10-CM

## 2021-06-22 DIAGNOSIS — M159 Polyosteoarthritis, unspecified: Secondary | ICD-10-CM

## 2021-06-22 DIAGNOSIS — K5909 Other constipation: Secondary | ICD-10-CM

## 2021-06-22 DIAGNOSIS — R634 Abnormal weight loss: Secondary | ICD-10-CM

## 2021-06-22 DIAGNOSIS — J432 Centrilobular emphysema: Secondary | ICD-10-CM

## 2021-06-22 DIAGNOSIS — Z148 Genetic carrier of other disease: Secondary | ICD-10-CM | POA: Diagnosis not present

## 2021-06-22 DIAGNOSIS — E039 Hypothyroidism, unspecified: Secondary | ICD-10-CM

## 2021-06-22 DIAGNOSIS — R2681 Unsteadiness on feet: Secondary | ICD-10-CM | POA: Diagnosis not present

## 2021-06-22 NOTE — Progress Notes (Unsigned)
Established patient visit  I,Terri Braun,acting as a scribe for [name], MD.,have documented all relevant documentation on the behalf of [name], MD,as directed by  [name], MD while in the presence of [name], MD.   Patient: Terri Braun   DOB: 10-25-1929   88 y.o. Female  MRN: [id number] Visit Date: 06/22/2021  Today's healthcare provider: [name], MD   Chief Complaint  Patient presents with   Follow-up   Hypertension   Subjective    HPI  Patient has recovered from her leg injury but has very unsteady gait.  She requests another PT referral to help her which has done so in the past. She also complains of chronic constipation and has been using Dulcolax on occasion.  No abdominal pain. Her daughter points out that she has had weight loss but they wish no invasive work-up. Hypertension, follow-up  BP Readings from Last 3 Encounters:  06/22/21 (!) 151/61  04/30/21 (!) 127/46  04/20/21 (!) 144/62   Wt Readings from Last 3 Encounters:  06/22/21 139 lb (63 kg)  04/30/21 142 lb 8 oz (64.6 kg)  04/20/21 141 lb 15.6 oz (64.4 kg)     She was last seen for hypertension 3 months ago.  Management since that visit includes; Decreased losartan from 100 mg to 50 mg qd. Outside blood pressures are not checking.  Pertinent labs Lab Results  Component Value Date   CHOL 150 03/26/2021   HDL 79 03/26/2021   LDLCALC 52 03/26/2021   LDLDIRECT 47 04/23/2020   TRIG 105 03/26/2021   CHOLHDL 1.9 03/26/2021   Lab Results  Component Value Date   NA 141 03/26/2021   K 4.1 03/26/2021   CREATININE 1.10 (H) 03/26/2021   EGFR 48 (L) 03/26/2021   GLUCOSE 101 (H) 03/26/2021   TSH 2.910 03/26/2021     The ASCVD Risk score (Arnett DK, et al., 2019) failed to calculate for the following reasons:   The 2019 ASCVD risk score is only valid for ages 34 to  4  --------------------------------------------------------------------------------------------------- Follow up for Primary osteoarthritis involving multiple joints:  The patient was last seen for this 3 months ago. Changes made at last visit include; Restarted Cymbalta 60 mg qd.. -----------------------------------------------------------------------------------------   Medications: Outpatient Medications Prior to Visit  Medication Sig   acetaminophen (TYLENOL) 500 MG tablet Take 500 mg by mouth every 4 (four) hours as needed.   alendronate (FOSAMAX) 70 MG tablet alendronate 70 mg tablet  TAKE 1 TABLET BY MOUTH ONCE WEEKLY   celecoxib (CELEBREX) 100 MG capsule TAKE 1 CAPSULE BY MOUTH EVERY DAY   clopidogrel (PLAVIX) 75 MG tablet TAKE 1 TABLET BY MOUTH EVERY DAY   conjugated estrogens (PREMARIN) vaginal cream Apply one dab to Urethra daily at bedtime   DULoxetine (CYMBALTA) 60 MG capsule TAKE 1 CAPSULE BY MOUTH EVERY DAY   Fluticasone-Umeclidin-Vilant (TRELEGY ELLIPTA) 100-62.5-25 MCG/INH AEPB Inhale 1 puff into the lungs daily.   isosorbide mononitrate (IMDUR) 30 MG 24 hr tablet TAKE 1 TABLET BY MOUTH EVERY DAY   losartan (COZAAR) 25 MG tablet Take 1 tablet (25 mg total) by mouth daily.   nitrofurantoin, macrocrystal-monohydrate, (MACROBID) 100 MG capsule Take 1 capsule (100 mg total) by mouth 2 (two) times daily.   nitroGLYCERIN (NITROSTAT) 0.4 MG SL tablet Place 1 tablet (0.4 mg total) under the tongue every 5 (five) minutes as needed for chest pain.   rosuvastatin (CRESTOR) 10 MG tablet Take 1 tablet (  10 mg total) by mouth daily.   No facility-administered medications prior to visit.    Review of Systems  Constitutional:  Negative for appetite change, chills, fatigue and fever.  Respiratory:  Negative for chest tightness and shortness of breath.   Cardiovascular:  Negative for chest pain and palpitations.  Gastrointestinal:  Negative for abdominal pain, nausea and vomiting.   Neurological:  Negative for dizziness and weakness.   Last CBC Lab Results  Component Value Date   WBC 5.3 03/26/2021   HGB 9.6 (L) 03/26/2021   HCT 30.3 (L) 03/26/2021   MCV 94 03/26/2021   MCH 29.6 03/26/2021   RDW 14.6 03/26/2021   PLT 232 03/26/2021       Objective    BP (!) 151/61 (BP Location: Right Arm, Patient Position: Sitting, Cuff Size: Normal)   Pulse 72   Temp 97.8 F (36.6 C) (Temporal)   Resp 18   Ht '5\' 5"'  (1.651 m)   Wt 139 lb (63 kg)   SpO2 97%   BMI 23.13 kg/m  BP Readings from Last 3 Encounters:  06/22/21 (!) 151/61  04/30/21 (!) 127/46  04/20/21 (!) 144/62   Wt Readings from Last 3 Encounters:  06/22/21 139 lb (63 kg)  04/30/21 142 lb 8 oz (64.6 kg)  04/20/21 141 lb 15.6 oz (64.4 kg)      Physical Exam Vitals reviewed.  Constitutional:      Appearance: Normal appearance.  HENT:     Head: Normocephalic and atraumatic.     Mouth/Throat:     Pharynx: Oropharynx is clear.  Eyes:     Conjunctiva/sclera: Conjunctivae normal.  Cardiovascular:     Rate and Rhythm: Normal rate and regular rhythm.     Pulses: Normal pulses.     Heart sounds: Normal heart sounds. No murmur heard.   No friction rub. No gallop.  Pulmonary:     Effort: Pulmonary effort is normal. No respiratory distress.     Breath sounds: Normal breath sounds. No stridor. No wheezing, rhonchi or rales.  Chest:     Chest wall: No tenderness.  Abdominal:     Palpations: Abdomen is soft.     Tenderness: There is no right CVA tenderness, left CVA tenderness, guarding or rebound.  Musculoskeletal:     Cervical back: Normal range of motion and neck supple.  Skin:    General: Skin is warm and dry.  Neurological:     Mental Status: She is alert and oriented to person, place, and time. Mental status is at baseline.     Motor: No weakness.     Gait: Gait normal.  Psychiatric:        Mood and Affect: Mood normal.        Behavior: Behavior normal.        Thought Content: Thought  content normal.        Judgment: Judgment normal.      No results found for any visits on 06/22/21.  Assessment & Plan     1. Essential hypertension, benign Discussed this with patient and daughter. I am worried about fall risk and hypotension on the diastolic side so well I will systolic to creep up a little bit  2. Gait instability Refer back to physical therapy - Ambulatory referral to Physical Therapy  3. Carrier of alpha-1-antitrypsin deficiency   4. Centrilobular emphysema ([place]) Asymptomatic at this time  5. Primary osteoarthritis involving multiple joints Knee pain bothers her a lot  6. Primary hypothyroidism  7. Weight loss Patient wishes no evaluation at this time.  Have encouraged her to snack between meals and eat what ever she wants really  8. Chronic constipation Stop Dulcolax and try Metamucil daily and/or Lactulax as needed.  Return in about 4 months (around 10/23/2021).      I, [name], MD, have reviewed all documentation for this visit. The documentation on 06/23/21 for the exam, diagnosis, procedures, and orders are all accurate and complete.    Terrel Manalo Cranford Mon, MD  [facility] [phone] (phone) [phone] (fax)  [place]

## 2021-06-22 NOTE — Patient Instructions (Signed)
OK TO TAKE METAMUCIL AND GLYCOLAX.

## 2021-06-29 ENCOUNTER — Other Ambulatory Visit: Payer: Self-pay | Admitting: Physician Assistant

## 2021-07-08 ENCOUNTER — Telehealth: Payer: Self-pay

## 2021-07-08 NOTE — Telephone Encounter (Signed)
Copied from [place] [id number]. Topic: General - Other >> Jul 08, 2021  2:55 PM Ja-Kwan M wrote: Reason for CRM: Gery Pray, PT and Rehab Director called for an update on orders and to provide their fax# [phone] and her direct ph# [phone]

## 2021-07-08 NOTE — Telephone Encounter (Signed)
Please advise? Have you seen orders for this pt?

## 2021-07-12 ENCOUNTER — Telehealth: Payer: Self-pay

## 2021-07-12 NOTE — Telephone Encounter (Signed)
Copied from [place]. Topic: Referral - Status >> Jul 12, 2021  4:29 PM Cyndi Bender wrote: Reason for CRM: Pt daughter Wilfred Curtis requests referral to another physical therapy location due to the amount of time before patient could be seen. Cb# [phone]

## 2021-07-13 NOTE — Telephone Encounter (Signed)
Please advise 

## 2021-07-14 NOTE — Telephone Encounter (Signed)
Still waiting on fax.

## 2021-07-21 ENCOUNTER — Telehealth: Payer: Self-pay

## 2021-07-21 NOTE — Telephone Encounter (Signed)
Copied from [place]. Topic: Referral - Question >> Jul 21, 2021 11:17 AM Rudene Anda wrote: Reason for CRM: Pt called in to follow up on the [place], pt refused previous referral that was sent in for PT, please advise if a different PT referral will be sent in.

## 2021-07-21 NOTE — Telephone Encounter (Signed)
Hold this message for now. Checking with Referrals to see if referral placed in May is still active.

## 2021-07-21 NOTE — Telephone Encounter (Signed)
Per [facility] Poisson referral is still active. Patient needs to call PT office to set up an appt. [phone]

## 2021-07-22 NOTE — Telephone Encounter (Signed)
Spoke to [facility]. They want to use [facility], which is located at her residence. Sent information to [employer].

## 2021-08-09 NOTE — Progress Notes (Signed)
Cardiology Office Note:    Date:  08/10/2021   ID:  [name], DOB [birth year], MRN [id number]  PCP:  Maple Hudson., MD  [facility] Cardiologist:  Julien Nordmann, MD  [facility] Electrophysiologist:  None   Referring MD: Maple Hudson.,*   Chief Complaint: 1 year follow-up  History of Present Illness:    Terri Braun is a 88 y.o. female with a hx of CAD s/p PCI/DES to the RCA in 2012, COPD with chronic SOB, HTN, HLD, anemia, hypothyroidism, gait instability with falls, OA, and GERD who presents for 1 year follow-up.   She was previously followed by cardiology in [place], [place].  She underwent LHC in 04/2010 with PCI/DES with a Xience drug-eluting stent to the ostial RCA.  Repeat cath in 2015 demonstrated a patent stent with 50% ostial narrowing followed by 30% ISR as well as a 20 to 30% LAD disease, 20 to 30% ostial LCx disease, and a small PDA with 40% stenosis.  Echo in 03/2015 showed normal LV systolic function with mild mitral regurgitation and aortic insufficiency.  She was last seen in the office in 04/2019 and was doing well from a cardiac perspective though was noted to have broken 3 bones in her right foot which limited her ambulation.   She was seen in the [facility] ED in 08/2019 with possible syncope versus fall.  Troponin was checked x1 and found to be normal.  EKG showed sinus rhythm with no acute ischemic changes.  CT head showed no evidence of acute intracranial abnormality with noted small chronic infarcts within the cerebellar hemispheres bilaterally and chronic small vessel ischemic disease.  UA consistent with UTI.  Hemoglobin was low though stable.  Symptoms were felt to be related to vasovagal near syncope and she was treated for UTI.  Myoview Lexiscan 05/2020 showed no significant ischemia, normal LVEF, CT attenuation imaging with mild diffuse aortic atherosclerosis, mild 3V CAD, overall low risk scan. Echo showed LVEF 60-65%, no WMA, mild  LVH, G1DD, mild AI  Last seen 07/2020 and cardiac testing was reviewed. Recommended she start weight bearing exercises.   Today, the patient reports she has left sided chest pain underneath the armpit. It is intermittent, not worse on exertion. It is worse with laying down. No associated symptoms. She has chronic DOE that is unchanged. She has some lower leg edema from prior broken toes and arthritis. She lives by herself in an assisted living. She reports BRBPR for the lat 3 days. BP is high today. Losartan has been decreased from 100 to 25mg  int he past few months for hypotension.   Past Medical History:  Diagnosis Date   Anemia    ASCVD (arteriosclerotic cardiovascular disease)    COPD (chronic obstructive pulmonary disease) (HCC)    Diverticulosis    GERD (gastroesophageal reflux disease)    Hypertension    Hypothyroidism     Past Surgical History:  Procedure Laterality Date   APPENDECTOMY     BREAST SURGERY  1983   Lumpectomy   CHOLECYSTECTOMY     COLON SURGERY     COLONOSCOPY  11/04/2015   CORONARY ANGIOPLASTY WITH STENT PLACEMENT     NISSEN FUNDOPLICATION     TOTAL ABDOMINAL HYSTERECTOMY     With BSO    Current Medications: Current Meds  Medication Sig   acetaminophen (TYLENOL) 500 MG tablet Take 500 mg by mouth every 4 (four) hours as needed.   alendronate (FOSAMAX) 70 MG tablet alendronate 70 mg  tablet  TAKE 1 TABLET BY MOUTH ONCE WEEKLY   celecoxib (CELEBREX) 100 MG capsule TAKE 1 CAPSULE BY MOUTH EVERY DAY   clopidogrel (PLAVIX) 75 MG tablet TAKE 1 TABLET BY MOUTH EVERY DAY   conjugated estrogens (PREMARIN) vaginal cream Apply one dab to Urethra daily at bedtime   DULoxetine (CYMBALTA) 60 MG capsule TAKE 1 CAPSULE BY MOUTH EVERY DAY   Fluticasone-Umeclidin-Vilant (TRELEGY ELLIPTA) 100-62.5-25 MCG/INH AEPB Inhale 1 puff into the lungs daily. (Patient taking differently: Inhale 1 puff into the lungs as needed.)   isosorbide mononitrate (IMDUR) 30 MG 24 hr tablet TAKE  1 TABLET BY MOUTH EVERY DAY   losartan (COZAAR) 50 MG tablet Take 1 tablet (50 mg total) by mouth daily.   nitroGLYCERIN (NITROSTAT) 0.4 MG SL tablet Place 1 tablet (0.4 mg total) under the tongue every 5 (five) minutes as needed for chest pain.   rosuvastatin (CRESTOR) 10 MG tablet TAKE 1 TABLET BY MOUTH EVERY DAY   [DISCONTINUED] losartan (COZAAR) 25 MG tablet Take 1 tablet (25 mg total) by mouth daily.     Allergies:   Shellfish allergy and Sulfa antibiotics   Social History   Socioeconomic History   Marital status: Widowed    Spouse name: Not on file   Number of children: Not on file   Years of education: Not on file   Highest education level: Not on file  Occupational History   Not on file  Tobacco Use   Smoking status: Never   Smokeless tobacco: Never  Vaping Use   Vaping Use: Never used  Substance and Sexual Activity   Alcohol use: Never   Drug use: Never   Sexual activity: Not on file  Other Topics Concern   Not on file  Social History Narrative   She is widowed she has sons and daughters, she moved here from [place] to live in [place] near her daughter   2 caffeinated beverages a day   Rare alcohol   Never smoker   Social Determinants of Corporate investment banker Strain: Not on file  Food Insecurity: Not on file  Transportation Needs: Not on file  Physical Activity: Not on file  Stress: Not on file  Social Connections: Not on file     Family History: The patient's family history includes Cancer in an other family member; Coronary artery disease in her mother; Crohn's disease in her son; Heart disease in her mother.  ROS:   Please see the history of present illness.     All other systems reviewed and are negative.  EKGs/Labs/Other Studies Reviewed:    The following studies were reviewed today:   Echo 05/2020 1. Left ventricular ejection fraction, by estimation, is 60 to 65%. The  left ventricle has normal function. The left  ventricle has no regional  wall motion abnormalities. There is mild left ventricular hypertrophy.  Left ventricular diastolic parameters  are consistent with Grade I diastolic dysfunction (impaired relaxation).   2. Right ventricular systolic function is normal. The right ventricular  size is normal.   3. Left atrial size was mildly dilated.   4. The mitral valve is normal in structure. No evidence of mitral valve  regurgitation.   5. The aortic valve is tricuspid. Aortic valve regurgitation is mild.   6. The inferior vena cava is normal in size with greater than 50%  respiratory variability, suggesting right atrial pressure of 3 mmHg.   Myoview Lexsican 05/2020 Narrative & Impression  Pharmacological myocardial  perfusion imaging study with no significant  ischemia Normal wall motion, EF estimated at 93% No EKG changes concerning for ischemia at peak stress or in recovery. CT attenuation correction images with mild diffuse aortic atherosclerosis, Mild three-vessel coronary calcification  Low risk scan     Signed, Dossie Arbour, MD, Ph.D [facility]    EKG:  EKG is  ordered today.  The ekg ordered today demonstrates NSR 74bpm, no ST/T wave changes  Recent Labs: 03/26/2021: ALT 9; BUN 18; Creatinine, Ser 1.10; Hemoglobin 9.6; Platelets 232; Potassium 4.1; Sodium 141; TSH 2.910  Recent Lipid Panel    Component Value Date/Time   CHOL 150 03/26/2021 0901   TRIG 105 03/26/2021 0901   HDL 79 03/26/2021 0901   CHOLHDL 1.9 03/26/2021 0901   LDLCALC 52 03/26/2021 0901   LDLDIRECT 47 04/23/2020 1548    Physical Exam:    VS:  BP (!) 172/80 (BP Location: Left Arm, Patient Position: Sitting, Cuff Size: Normal)   Pulse 74   Ht 5\' 5"  (1.651 m)   Wt 138 lb 6.4 oz (62.8 kg)   SpO2 97%   BMI 23.03 kg/m     Wt Readings from Last 3 Encounters:  08/10/21 138 lb 6.4 oz (62.8 kg)  06/22/21 139 lb (63 kg)  04/30/21 142 lb 8 oz (64.6 kg)     GEN:  Well nourished, well developed in no  acute distress HEENT: Normal NECK: No JVD; No carotid bruits LYMPHATICS: No lymphadenopathy CARDIAC: RRR, no murmurs, rubs, gallops RESPIRATORY:  Clear to auscultation without rales, wheezing or rhonchi  ABDOMEN: Soft, non-tender, non-distended MUSCULOSKELETAL:  No edema; No deformity  SKIN: Warm and dry NEUROLOGIC:  Alert and oriented x 3 PSYCHIATRIC:  Normal affect   ASSESSMENT:    1. Coronary artery disease involving native coronary artery of native heart with other form of angina pectoris (HCC)   2. Rectal bleeding   3. Gait instability   4. Essential hypertension   5. Anemia, unspecified type   6. BRBPR (bright red blood per rectum)    PLAN:    In order of problems listed above:  Atypical chest pain Coronary artery calcifications She reports atypical chest pain on the lest side. She has a long history of left lateral chest discomfort that is worse with laying down. Myoview Lexiscan in 2022 showed no ischemia, overall low risk. Patient is not very functional at baseline. No plan for ischemic work-up at this time. Continue Plavix, Imdur, Crestor and Losartan.   BRBPR Anemia Patient reports BRBPR for 3 days. I will check a CBC. She is on Plavix. I recommended she see PCP or go to the ER if this continues. Baseline HG 9-10.  Gait instability She is sedentary at baseline. She still has occasional falls from leg weakness. She uses a walker frequently.   HTN H/o Hypotension Losartan has been decreased from 100 to 25mg  daily for hypotension. BP today is high and readings at home have been high. I will increase Losartan to 50mg  daily. She has follow-up with PCP in 3 months. Continue Imdur 30mg  daily.   Disposition: Follow up in 1 year(s) with [facility]    Signed, Valeri Sula David Stall, PA-C  08/10/2021 2:55 PM    [facility]

## 2021-08-10 ENCOUNTER — Other Ambulatory Visit
Admission: RE | Admit: 2021-08-10 | Discharge: 2021-08-10 | Disposition: A | Discharge status: Home / Self Care | Payer: Medicare PPO | Attending: Medical | Admitting: Medical

## 2021-08-10 ENCOUNTER — Encounter: Payer: Self-pay | Admitting: Medical

## 2021-08-10 ENCOUNTER — Ambulatory Visit: Payer: Medicare PPO | Admitting: Medical

## 2021-08-10 VITALS — BP 172/80 | HR 74 | Ht 65.0 in | Wt 138.4 lb

## 2021-08-10 DIAGNOSIS — I10 Essential (primary) hypertension: Secondary | ICD-10-CM

## 2021-08-10 DIAGNOSIS — I25118 Atherosclerotic heart disease of native coronary artery with other forms of angina pectoris: Secondary | ICD-10-CM | POA: Diagnosis not present

## 2021-08-10 DIAGNOSIS — R2681 Unsteadiness on feet: Secondary | ICD-10-CM | POA: Diagnosis not present

## 2021-08-10 DIAGNOSIS — K625 Hemorrhage of anus and rectum: Secondary | ICD-10-CM

## 2021-08-10 DIAGNOSIS — D649 Anemia, unspecified: Secondary | ICD-10-CM

## 2021-08-10 LAB — CBC
HCT: 32.5 % — ABNORMAL LOW (ref 36.0–46.0)
Hemoglobin: 10 g/dL — ABNORMAL LOW (ref 12.0–15.0)
MCH: 28.9 pg (ref 26.0–34.0)
MCHC: 30.8 g/dL (ref 30.0–36.0)
MCV: 93.9 fL (ref 80.0–100.0)
Platelets: 241 10*3/uL (ref 150–400)
RBC: 3.46 MIL/uL — ABNORMAL LOW (ref 3.87–5.11)
RDW: 15.1 % (ref 11.5–15.5)
WBC: 6 10*3/uL (ref 4.0–10.5)
nRBC: 0 % (ref 0.0–0.2)

## 2021-08-10 MED ORDER — LOSARTAN POTASSIUM 50 MG PO TABS: 50.0000 mg | ORAL_TABLET | Freq: Every day | ORAL | 3 refills | Status: DC

## 2021-08-10 NOTE — Progress Notes (Signed)
CBC shows stable Hgb. Overall reassuring. Still go by recommendations given during the visit.

## 2021-08-10 NOTE — Patient Instructions (Signed)
Medication Instructions:  - Your physician has recommended you make the following change in your medication:   1) INCREASE Losartan to 50 mg: - take 1 tablet by mouth once daily   *If you need a refill on your cardiac medications before your next appointment, please call your pharmacy*   Lab Work: - Your physician recommends that you have lab work today:  Arboriculturist at [facility] 1st desk on the right to check in (REGISTRATION)  Lab hours: Monday- Friday (7:30 am- 5:30 pm)   If you have labs (blood work) drawn today and your tests are completely normal, you will receive your results only by: MyChart Message (if you have MyChart) OR A paper copy in the mail If you have any lab test that is abnormal or we need to change your treatment, we will call you to review the results.   Testing/Procedures: - none ordered   Follow-Up: At [facility], you and your health needs are our priority.  As part of our continuing mission to provide you with exceptional heart care, we have created designated Provider Care Teams.  These Care Teams include your primary Cardiologist (physician) and Advanced Practice Providers (APPs -  Physician Assistants and Nurse Practitioners) who all work together to provide you with the care you need, when you need it.  We recommend signing up for the patient portal called "MyChart".  Sign up information is provided on this After Visit Summary.  MyChart is used to connect with patients for Virtual Visits (Telemedicine).  Patients are able to view lab/test results, encounter notes, upcoming appointments, etc.  Non-urgent messages can be sent to your provider as well.   To learn more about what you can do with MyChart, go to [email or web address].    Your next appointment:   1 year(s)  The format for your next appointment:   In Person  Provider:   You may see Ida Rogue, MD or one of the following Advanced Practice Providers on your designated  Care Team:    Cadence Kathlen Mody, [place]    Other Instructions N/a  Important Information About Sugar

## 2021-08-12 NOTE — Telephone Encounter (Addendum)
Danielle from [facility] is calling to follow up. Danielle stated that she has not received anything from [name].  Andee Poles will send a fax with an order to please return .     Call back: [phone]  Fax - [phone]

## 2021-08-12 NOTE — Telephone Encounter (Signed)
Noted  

## 2021-08-13 NOTE — Telephone Encounter (Signed)
Received fax and placed it in provider's basket for review.

## 2021-08-16 ENCOUNTER — Telehealth: Payer: Self-pay | Admitting: Family Medicine

## 2021-08-16 ENCOUNTER — Ambulatory Visit: Payer: Self-pay | Admitting: *Deleted

## 2021-08-16 NOTE — Telephone Encounter (Signed)
I returned her call.   My right elbow is swollen and painful.   It's difficult to push my walker due to the pain.  It hurts to bend it but I can bend it.   At least a month or more this has been going on.   I have arthritis all over so this might be arthritis.  I'm taking Tylenol which helps the pain.  Denies falls or injuries, no bruising or open wounds.    I scheduled you with Dr. Rosanna Randy for 08/18/2021 at 10:20.        Chief Complaint: Right elbow swollen and painful Symptoms: Elbow has swelling and is painful to bend but she can bend it Frequency: For at least the last month Pertinent Negatives: Patient denies Injuries or falls, open wounds Disposition: '[]'$ ED /'[]'$ Urgent Care (no appt availability in office) / '[x]'$ Appointment(In office/virtual)/ '[]'$  [facility] [facility]/ '[]'$ Home Care/ '[]'$ Refused Recommended Disposition /'[]'$ [facility]/ '[]'$  Follow-up with PCP Additional Notes:   (

## 2021-08-16 NOTE — Telephone Encounter (Signed)
Message from Luciana Axe sent at 08/16/2021 10:04 AM EDT  Summary: Lump on the right elbow advice   Pt is calling to report to a lump on the patient's right elbow with Pain. Please advise           Call History   Type Contact Phone/Fax User  08/16/2021 10:03 AM EDT Phone (Incoming) Braun, Terri "Dot" (Self) [phone] Jerilynn Mages) Blase Mess A   Reason for Disposition . SEVERE joint swelling (e.g., can barely bend or move elbow joint)  Protocols used: Elbow Swelling-A-AH

## 2021-08-16 NOTE — Telephone Encounter (Signed)
Pts daughter is calling to report that [employer]has not received orders. Advised that orders where sent 06/22/21. Please advise CB- [phone]

## 2021-08-18 ENCOUNTER — Encounter: Payer: Self-pay | Admitting: Family Medicine

## 2021-08-18 ENCOUNTER — Ambulatory Visit (INDEPENDENT_AMBULATORY_CARE_PROVIDER_SITE_OTHER): Payer: Medicare PPO | Admitting: Family Medicine

## 2021-08-18 VITALS — BP 161/79 | HR 78 | Resp 16 | Wt 137.0 lb

## 2021-08-18 DIAGNOSIS — L03113 Cellulitis of right upper limb: Secondary | ICD-10-CM | POA: Diagnosis not present

## 2021-08-18 DIAGNOSIS — M7021 Olecranon bursitis, right elbow: Secondary | ICD-10-CM | POA: Diagnosis not present

## 2021-08-18 DIAGNOSIS — I10 Essential (primary) hypertension: Secondary | ICD-10-CM

## 2021-08-18 MED ORDER — AMOXICILLIN-POT CLAVULANATE 875-125 MG PO TABS: 1.0000 | ORAL_TABLET | Freq: Two times a day (BID) | ORAL | 0 refills | Status: DC

## 2021-08-18 NOTE — Progress Notes (Signed)
Established patient visit  I,[name],acting as a scribe for [name], MD.,have documented all relevant documentation on the behalf of [name], MD,as directed by  [name], MD while in the presence of [name], MD.   Patient: Terri Braun   DOB: 09-18-1929   88 y.o. Female  MRN: [id number] Visit Date: 08/18/2021  Today's healthcare provider: [name], MD   Chief Complaint  Patient presents with   Joint Swelling   Subjective    HPI  Patient has had swelling in right elbow for around 2 month. Patient states pain is severe. Elbow also will become inflamed. Patient states she has been icing her elbow.  No known trauma or falls to cause this.  She has had no fever or chills.  No warmth around the elbow. Medications: Outpatient Medications Prior to Visit  Medication Sig   acetaminophen (TYLENOL) 500 MG tablet Take 500 mg by mouth every 4 (four) hours as needed.   alendronate (FOSAMAX) 70 MG tablet alendronate 70 mg tablet  TAKE 1 TABLET BY MOUTH ONCE WEEKLY   celecoxib (CELEBREX) 100 MG capsule TAKE 1 CAPSULE BY MOUTH EVERY DAY   clopidogrel (PLAVIX) 75 MG tablet TAKE 1 TABLET BY MOUTH EVERY DAY   conjugated estrogens (PREMARIN) vaginal cream Apply one dab to Urethra daily at bedtime   DULoxetine (CYMBALTA) 60 MG capsule TAKE 1 CAPSULE BY MOUTH EVERY DAY   Fluticasone-Umeclidin-Vilant (TRELEGY ELLIPTA) 100-62.5-25 MCG/INH AEPB Inhale 1 puff into the lungs daily. (Patient taking differently: Inhale 1 puff into the lungs as needed.)   isosorbide mononitrate (IMDUR) 30 MG 24 hr tablet TAKE 1 TABLET BY MOUTH EVERY DAY   losartan (COZAAR) 50 MG tablet Take 1 tablet by mouth daily.   nitroGLYCERIN (NITROSTAT) 0.4 MG SL tablet Place 1 tablet (0.4 mg total) under the tongue every 5 (five) minutes as needed for chest pain.   rosuvastatin (CRESTOR) 10 MG tablet TAKE 1 TABLET BY MOUTH EVERY DAY   [DISCONTINUED] losartan (COZAAR) 50 MG  tablet Take 1 tablet (50 mg total) by mouth daily.   No facility-administered medications prior to visit.    Review of Systems  Last CBC Lab Results  Component Value Date   WBC 6.0 08/10/2021   HGB 10.0 (L) 08/10/2021   HCT 32.5 (L) 08/10/2021   MCV 93.9 08/10/2021   MCH 28.9 08/10/2021   RDW 15.1 08/10/2021   PLT 241 08/10/2021       Objective    BP (!) 161/79 (BP Location: Left Arm, Patient Position: Sitting, Cuff Size: Normal)   Pulse 78   Resp 16   Wt 137 lb (62.1 kg)   SpO2 97%   BMI 22.80 kg/m  BP Readings from Last 3 Encounters:  08/18/21 (!) 161/79  08/10/21 (!) 172/80  06/22/21 (!) 151/61   Wt Readings from Last 3 Encounters:  08/18/21 137 lb (62.1 kg)  08/10/21 138 lb 6.4 oz (62.8 kg)  06/22/21 139 lb (63 kg)      Physical Exam Vitals reviewed.  Constitutional:      General: She is not in acute distress.    Appearance: She is well-developed.  HENT:     Head: Normocephalic and atraumatic.     Right Ear: Hearing normal.     Left Ear: Hearing normal.     Nose: Nose normal.  Eyes:     General: Lids are normal. No scleral icterus.       Right  eye: No discharge.        Left eye: No discharge.     Conjunctiva/sclera: Conjunctivae normal.  Cardiovascular:     Rate and Rhythm: Normal rate and regular rhythm.     Heart sounds: Normal heart sounds.  Pulmonary:     Effort: Pulmonary effort is normal. No respiratory distress.  Musculoskeletal:     Comments: Swelling of right olecranon bursa with mild tenderness , no epitrochlear adenopathy.  She has full range of motion of the elbow.  Skin:    General: Skin is warm and dry.     Findings: No lesion or rash.  Neurological:     General: No focal deficit present.     Mental Status: She is alert and oriented to person, place, and time.  Psychiatric:        Mood and Affect: Mood normal.        Speech: Speech normal.        Behavior: Behavior normal.        Thought Content: Thought content normal.         Judgment: Judgment normal.       No results found for any visits on 08/18/21.  Assessment & Plan     1. Olecranon bursitis of right elbow To treat this is infectious although do not think there is much risk of infection. Refer to orthopedics. - AMB referral to orthopedics  2. Cellulitis of right upper extremity Right elbow - amoxicillin-clavulanate (AUGMENTIN) 875-125 MG tablet; Take 1 tablet by mouth 2 (two) times daily.  Dispense: 10 tablet; Refill: 0  3. Essential hypertension, benign  - losartan (COZAAR) 50 MG tablet; Take 1 tablet by mouth daily.    No follow-ups on file.      I, [name], MD, have reviewed all documentation for this visit. The documentation on 08/21/21 for the exam, diagnosis, procedures, and orders are all accurate and complete.    Axxel Gude Cranford Mon, MD  [facility] [phone] (phone) [phone] (fax)  [place]

## 2021-08-23 DIAGNOSIS — M7021 Olecranon bursitis, right elbow: Secondary | ICD-10-CM | POA: Insufficient documentation

## 2021-09-29 ENCOUNTER — Ambulatory Visit (INDEPENDENT_AMBULATORY_CARE_PROVIDER_SITE_OTHER): Payer: Medicare PPO

## 2021-09-29 VITALS — BP 140/60 | Ht 65.0 in | Wt 140.9 lb

## 2021-09-29 DIAGNOSIS — Z0000 Encounter for general adult medical examination without abnormal findings: Secondary | ICD-10-CM | POA: Diagnosis not present

## 2021-09-29 NOTE — Patient Instructions (Signed)
[name] , Thank you for taking time to come for your Medicare Wellness Visit. I appreciate your ongoing commitment to your health goals. Please review the following plan we discussed and let me know if I can assist you in the future.   Screening recommendations/referrals: Colonoscopy: aged out Mammogram: aged out Bone Density: aged out Recommended yearly ophthalmology/optometry visit for glaucoma screening and checkup Recommended yearly dental visit for hygiene and checkup  Vaccinations: Influenza vaccine: n/d Pneumococcal vaccine: 07/29/15 Tdap vaccine: n/d Shingles vaccine: n/d   Covid-19:had 3 shots, will bring card to next visit  Advanced directives: no  Conditions/risks identified: none  Next appointment: Follow up in one year for your annual wellness visit 10/04/22 @ 10 am in person   Preventive Care 86 Years and Older, Female Preventive care refers to lifestyle choices and visits with your health care provider that can promote health and wellness. What does preventive care include? A yearly physical exam. This is also called an annual well check. Dental exams once or twice a year. Routine eye exams. Ask your health care provider how often you should have your eyes checked. Personal lifestyle choices, including: Daily care of your teeth and gums. Regular physical activity. Eating a healthy diet. Avoiding tobacco and drug use. Limiting alcohol use. Practicing safe sex. Taking low-dose aspirin every day. Taking vitamin and mineral supplements as recommended by your health care provider. What happens during an annual well check? The services and screenings done by your health care provider during your annual well check will depend on your age, overall health, lifestyle risk factors, and family history of disease. Counseling  Your health care provider may ask you questions about your: Alcohol use. Tobacco use. Drug use. Emotional well-being. Home and relationship  well-being. Sexual activity. Eating habits. History of falls. Memory and ability to understand (cognition). Work and work Statistician. Reproductive health. Screening  You may have the following tests or measurements: Height, weight, and BMI. Blood pressure. Lipid and cholesterol levels. These may be checked every 5 years, or more frequently if you are over 69 years old. Skin check. Lung cancer screening. You may have this screening every year starting at age 86 if you have a 30-pack-year history of smoking and currently smoke or have quit within the past 15 years. Fecal occult blood test (FOBT) of the stool. You may have this test every year starting at age 86. Flexible sigmoidoscopy or colonoscopy. You may have a sigmoidoscopy every 5 years or a colonoscopy every 10 years starting at age 86. Hepatitis C blood test. Hepatitis B blood test. Sexually transmitted disease (STD) testing. Diabetes screening. This is done by checking your blood sugar (glucose) after you have not eaten for a while (fasting). You may have this done every 1-3 years. Bone density scan. This is done to screen for osteoporosis. You may have this done starting at age 86. Mammogram. This may be done every 1-2 years. Talk to your health care provider about how often you should have regular mammograms. Talk with your health care provider about your test results, treatment options, and if necessary, the need for more tests. Vaccines  Your health care provider may recommend certain vaccines, such as: Influenza vaccine. This is recommended every year. Tetanus, diphtheria, and acellular pertussis (Tdap, Td) vaccine. You may need a Td booster every 10 years. Zoster vaccine. You may need this after age 86. Pneumococcal 13-valent conjugate (PCV13) vaccine. One dose is recommended after age 86. Pneumococcal polysaccharide (PPSV23) vaccine. One dose is  recommended after age 86. Talk to your health care provider about which  screenings and vaccines you need and how often you need them. This information is not intended to replace advice given to you by your health care provider. Make sure you discuss any questions you have with your health care provider. Document Released: 02/13/2015 Document Revised: 10/07/2015 Document Reviewed: 11/18/2014 Elsevier Interactive Patient Education  2017 [place] Prevention in the Home Falls can cause injuries. They can happen to people of all ages. There are many things you can do to make your home safe and to help prevent falls. What can I do on the outside of my home? Regularly fix the edges of walkways and driveways and fix any cracks. Remove anything that might make you trip as you walk through a door, such as a raised step or threshold. Trim any bushes or trees on the path to your home. Use bright outdoor lighting. Clear any walking paths of anything that might make someone trip, such as rocks or tools. Regularly check to see if handrails are loose or broken. Make sure that both sides of any steps have handrails. Any raised decks and porches should have guardrails on the edges. Have any leaves, snow, or ice cleared regularly. Use sand or salt on walking paths during winter. Clean up any spills in your garage right away. This includes oil or grease spills. What can I do in the bathroom? Use night lights. Install grab bars by the toilet and in the tub and shower. Do not use towel bars as grab bars. Use non-skid mats or decals in the tub or shower. If you need to sit down in the shower, use a plastic, non-slip stool. Keep the floor dry. Clean up any water that spills on the floor as soon as it happens. Remove soap buildup in the tub or shower regularly. Attach bath mats securely with double-sided non-slip rug tape. Do not have throw rugs and other things on the floor that can make you trip. What can I do in the bedroom? Use night lights. Make sure that you have a  light by your bed that is easy to reach. Do not use any sheets or blankets that are too big for your bed. They should not hang down onto the floor. Have a firm chair that has side arms. You can use this for support while you get dressed. Do not have throw rugs and other things on the floor that can make you trip. What can I do in the kitchen? Clean up any spills right away. Avoid walking on wet floors. Keep items that you use a lot in easy-to-reach places. If you need to reach something above you, use a strong step stool that has a grab bar. Keep electrical cords out of the way. Do not use floor polish or wax that makes floors slippery. If you must use wax, use non-skid floor wax. Do not have throw rugs and other things on the floor that can make you trip. What can I do with my stairs? Do not leave any items on the stairs. Make sure that there are handrails on both sides of the stairs and use them. Fix handrails that are broken or loose. Make sure that handrails are as long as the stairways. Check any carpeting to make sure that it is firmly attached to the stairs. Fix any carpet that is loose or worn. Avoid having throw rugs at the top or bottom of the stairs. If  you do have throw rugs, attach them to the floor with carpet tape. Make sure that you have a light switch at the top of the stairs and the bottom of the stairs. If you do not have them, ask someone to add them for you. What else can I do to help prevent falls? Wear shoes that: Do not have high heels. Have rubber bottoms. Are comfortable and fit you well. Are closed at the toe. Do not wear sandals. If you use a stepladder: Make sure that it is fully opened. Do not climb a closed stepladder. Make sure that both sides of the stepladder are locked into place. Ask someone to hold it for you, if possible. Clearly mark and make sure that you can see: Any grab bars or handrails. First and last steps. Where the edge of each step  is. Use tools that help you move around (mobility aids) if they are needed. These include: Canes. Walkers. Scooters. Crutches. Turn on the lights when you go into a dark area. Replace any light bulbs as soon as they burn out. Set up your furniture so you have a clear path. Avoid moving your furniture around. If any of your floors are uneven, fix them. If there are any pets around you, be aware of where they are. Review your medicines with your doctor. Some medicines can make you feel dizzy. This can increase your chance of falling. Ask your doctor what other things that you can do to help prevent falls. This information is not intended to replace advice given to you by your health care provider. Make sure you discuss any questions you have with your health care provider. Document Released: 11/13/2008 Document Revised: 06/25/2015 Document Reviewed: 02/21/2014 Elsevier Interactive Patient Education  2017 [employer].

## 2021-09-29 NOTE — Progress Notes (Signed)
Subjective:   Terri Braun is a 88 y.o. female who presents for Medicare Annual (Subsequent) preventive examination.  Review of Systems     Cardiac Risk Factors include: advanced age (>41mn, >>48women);hypertension;dyslipidemia     Objective:    Today's Vitals   09/29/21 1004  BP: (!) 140/60  Weight: 140 lb 14.4 oz (63.9 kg)  Height: '5\' 5"'$  (1.651 m)   Body mass index is 23.45 kg/m.     09/29/2021   10:13 AM 04/20/2021   11:54 AM 08/22/2019   10:22 AM 03/10/2018   11:10 AM  Advanced Directives  Does Patient Have a Medical Advance Directive? No No Yes Yes  Type of Advance Directive   Living will Living will  Would patient like information on creating a medical advance directive? No - Patient declined  No - Patient declined No - Patient declined    Current Medications (verified) Outpatient Encounter Medications as of 09/29/2021  Medication Sig   acetaminophen (TYLENOL) 500 MG tablet Take 500 mg by mouth every 4 (four) hours as needed.   alendronate (FOSAMAX) 70 MG tablet alendronate 70 mg tablet  TAKE 1 TABLET BY MOUTH ONCE WEEKLY   celecoxib (CELEBREX) 100 MG capsule TAKE 1 CAPSULE BY MOUTH EVERY DAY   clopidogrel (PLAVIX) 75 MG tablet TAKE 1 TABLET BY MOUTH EVERY DAY   conjugated estrogens (PREMARIN) vaginal cream Apply one dab to Urethra daily at bedtime   Fluticasone-Umeclidin-Vilant (TRELEGY ELLIPTA) 100-62.5-25 MCG/INH AEPB Inhale 1 puff into the lungs daily. (Patient taking differently: Inhale 1 puff into the lungs as needed.)   isosorbide mononitrate (IMDUR) 30 MG 24 hr tablet TAKE 1 TABLET BY MOUTH EVERY DAY   losartan (COZAAR) 50 MG tablet Take 1 tablet by mouth daily.   nitroGLYCERIN (NITROSTAT) 0.4 MG SL tablet Place 1 tablet (0.4 mg total) under the tongue every 5 (five) minutes as needed for chest pain.   rosuvastatin (CRESTOR) 10 MG tablet TAKE 1 TABLET BY MOUTH EVERY DAY   amoxicillin-clavulanate (AUGMENTIN) 875-125 MG tablet Take 1 tablet by mouth 2 (two)  times daily. (Patient not taking: Reported on 09/29/2021)   DULoxetine (CYMBALTA) 60 MG capsule TAKE 1 CAPSULE BY MOUTH EVERY DAY (Patient not taking: Reported on 09/29/2021)   No facility-administered encounter medications on file as of 09/29/2021.    Allergies (verified) Shellfish allergy, Shellfish-derived products, and Sulfa antibiotics   History: Past Medical History:  Diagnosis Date   Anemia    ASCVD (arteriosclerotic cardiovascular disease)    COPD (chronic obstructive pulmonary disease) (HCC)    Diverticulosis    GERD (gastroesophageal reflux disease)    Hypertension    Hypothyroidism    Past Surgical History:  Procedure Laterality Date   APPENDECTOMY     BREAST SURGERY  1983   Lumpectomy   CHOLECYSTECTOMY     COLON SURGERY     COLONOSCOPY  11/04/2015   CORONARY ANGIOPLASTY WITH STENT PLACEMENT     NISSEN FUNDOPLICATION     TOTAL ABDOMINAL HYSTERECTOMY     With BSO   Family History  Problem Relation Age of Onset   Coronary artery disease Mother    Heart disease Mother    Crohn's disease Son    Cancer Other        other family member with breast cancer   Social History   Socioeconomic History   Marital status: Widowed    Spouse name: Not on file   Number of children: Not on file   Years of education:  Not on file   Highest education level: Not on file  Occupational History   Not on file  Tobacco Use   Smoking status: Never   Smokeless tobacco: Never  Vaping Use   Vaping Use: Never used  Substance and Sexual Activity   Alcohol use: Never   Drug use: Never   Sexual activity: Not on file  Other Topics Concern   Not on file  Social History Narrative   She is widowed she has sons and daughters, she moved here from [place] to live in [place] near her daughter   2 caffeinated beverages a day   Rare alcohol   Never smoker   Social Determinants of Health   Financial Resource Strain: Low Risk  (09/29/2021)   Overall Financial  Resource Strain (CARDIA)    Difficulty of Paying Living Expenses: Not very hard  Food Insecurity: No Food Insecurity (09/29/2021)   Hunger Vital Sign    Worried About Running Out of Food in the Last Year: Never true    [place] in the Last Year: Never true  Transportation Needs: No Transportation Needs (09/29/2021)   PRAPARE - Hydrologist (Medical): No    Lack of Transportation (Non-Medical): No  Physical Activity: Insufficiently Active (09/29/2021)   Exercise Vital Sign    Days of Exercise per Week: 2 days    Minutes of Exercise per Session: 60 min  Stress: No Stress Concern Present (09/29/2021)   [place]    Feeling of Stress : Only a little  Social Connections: Moderately Isolated (09/29/2021)   Social Connection and Isolation Panel [NHANES]    Frequency of Communication with Friends and Family: More than three times a week    Frequency of Social Gatherings with Friends and Family: More than three times a week    Attends Religious Services: More than 4 times per year    Active Member of [employer] or Organizations: No    Attends Archivist Meetings: Never    Marital Status: Widowed    Tobacco Counseling Counseling given: Not Answered   Clinical Intake:  Pre-visit preparation completed: Yes  Pain : No/denies pain     Nutritional Risks: None Diabetes: No  How often do you need to have someone help you when you read instructions, pamphlets, or other written materials from your doctor or pharmacy?: 1 - Never  Diabetic?no  Interpreter Needed?: No  Information entered by :: Kirke Shaggy, LPN   Activities of Daily Living    09/29/2021   10:15 AM 04/30/2021    2:20 PM  In your present state of health, do you have any difficulty performing the following activities:  Hearing? 0 0  Vision? 0 0  Difficulty concentrating or making decisions? 0   Walking or climbing  stairs? 1 1  Dressing or bathing? 0 0  Doing errands, shopping? 1 0  Preparing Food and eating ? N   Using the Toilet? N   In the past six months, have you accidently leaked urine? N   Do you have problems with loss of bowel control? N   Managing your Medications? N   Managing your Finances? N   Housekeeping or managing your Housekeeping? Y     Patient Care Team: Jerrol Banana., MD as PCP - General (Family Medicine) Minna Merritts, MD as PCP - Cardiology (Cardiology)  Indicate any recent Medical Services you may have  received from other than [facility] providers in the past year (date may be approximate).     Assessment:   This is a routine wellness examination for [place].  Hearing/Vision screen Hearing Screening - Comments:: No aids Vision Screening - Comments:: Readers- Dr.Delmonte at [place] issues and exercise activities discussed: Current Exercise Habits: Home exercise routine, Time (Minutes): 60, Frequency (Times/Week): 2, Weekly Exercise (Minutes/Week): 120, Intensity: Mild   Goals Addressed             This Visit's Progress    DIET - EAT MORE FRUITS AND VEGETABLES         Depression Screen    09/29/2021   10:10 AM 04/30/2021    2:19 PM 03/25/2021    1:57 PM 09/15/2020    2:19 PM 03/16/2020    1:46 PM 12/13/2019    9:27 AM 09/02/2019    2:23 PM  PHQ 2/9 Scores  PHQ - 2 Score '2 4 6 '$ 0 '3 1 1  '$ PHQ- 9 Score '3 13 13 1 8 7 3    '$ Fall Risk    09/29/2021   10:14 AM 04/30/2021    2:19 PM 03/25/2021    1:56 PM 09/15/2020    2:20 PM 03/16/2020    1:46 PM  Fall Risk   Falls in the past year? '1 1 1 '$ 0 1  Number falls in past yr: '1 1 1 '$ 0 1  Comment     3  Injury with Fall? 0 0 0 0 1  Comment     3  Risk for fall due to : History of fall(s) History of fall(s) History of fall(s);Impaired mobility;Impaired balance/gait Impaired balance/gait Impaired balance/gait;History of fall(s);Impaired mobility;Orthopedic patient  Follow up Falls prevention  discussed;Falls evaluation completed  Falls evaluation completed Falls evaluation completed Falls evaluation completed    FALL RISK PREVENTION PERTAINING TO THE HOME:  Any stairs in or around the home? No  If so, are there any without handrails? No  Home free of loose throw rugs in walkways, pet beds, electrical cords, etc? Yes  Adequate lighting in your home to reduce risk of falls? Yes   ASSISTIVE DEVICES UTILIZED TO PREVENT FALLS:  Life alert? Yes  Use of a cane, walker or w/c? Yes  Grab bars in the bathroom? Yes  Shower chair or bench in shower? Yes  Elevated toilet seat or a handicapped toilet? Yes   TIMED UP AND GO:  Was the test performed? Yes .  Length of time to ambulate 10 feet: 6 sec.   Gait slow and steady with assistive device  Cognitive Function:        09/29/2021   10:22 AM 09/15/2020    2:20 PM  6CIT Screen  What Year? 0 points 0 points  What month? 0 points 0 points  What time? 0 points 0 points  Count back from 20 0 points 0 points  Months in reverse 2 points 0 points  Repeat phrase 4 points 0 points  Total Score 6 points 0 points    Immunizations Immunization History  Administered Date(s) Administered   Fluad Quad(high Dose 65+) 10/24/2018   Influenza Split 11/01/2013   Influenza, High Dose Seasonal PF 10/28/2016, 11/03/2017   Influenza,inj,Quad PF,6+ Mos 12/05/2014   Influenza-Unspecified 10/14/2015, 10/28/2016, 10/31/2016   Pneumococcal Conjugate-13 07/14/2014   Pneumococcal Polysaccharide-23 07/29/2015    TDAP status: Due, Education has been provided regarding the importance of this vaccine. Advised may receive this vaccine at local pharmacy or Health  Dept. Aware to provide a copy of the vaccination record if obtained from local pharmacy or Health Dept. Verbalized acceptance and understanding.  Flu Vaccine status: Declined, Education has been provided regarding the importance of this vaccine but patient still declined. Advised may receive  this vaccine at local pharmacy or Health Dept. Aware to provide a copy of the vaccination record if obtained from local pharmacy or Health Dept. Verbalized acceptance and understanding.  Pneumococcal vaccine status: Up to date  Covid-19 vaccine status: Completed vaccines  Qualifies for Shingles Vaccine? Yes   Zostavax completed No   Shingrix Completed?: No.    Education has been provided regarding the importance of this vaccine. Patient has been advised to call insurance company to determine out of pocket expense if they have not yet received this vaccine. Advised may also receive vaccine at local pharmacy or Health Dept. Verbalized acceptance and understanding.  Screening Tests Health Maintenance  Topic Date Due   COVID-19 Vaccine (1) Never done   TETANUS/TDAP  Never done   Zoster Vaccines- Shingrix (1 of 2) Never done   INFLUENZA VACCINE  08/31/2021   Pneumonia Vaccine 33+ Years old  Completed   DEXA SCAN  Completed   HPV VACCINES  Aged Out    Health Maintenance  Health Maintenance Due  Topic Date Due   COVID-19 Vaccine (1) Never done   TETANUS/TDAP  Never done   Zoster Vaccines- Shingrix (1 of 2) Never done   INFLUENZA VACCINE  08/31/2021    Colorectal cancer screening: No longer required.   Mammogram status: No longer required due to age.  Lung Cancer Screening: (Low Dose CT Chest recommended if Age 32-80 years, 30 pack-year currently smoking OR have quit w/in 15years.) does not qualify.   Additional Screening:  Hepatitis C Screening: does not qualify; Completed no  Vision Screening: Recommended annual ophthalmology exams for early detection of glaucoma and other disorders of the eye. Is the patient up to date with their annual eye exam?  Yes  Who is the provider or what is the name of the office in which the patient attends annual eye exams? Dr.Delmonte If pt is not established with a provider, would they like to be referred to a provider to establish care? No .    Dental Screening: Recommended annual dental exams for proper oral hygiene  Community Resource Referral / Chronic Care Management: CRR required this visit?  No   CCM required this visit?  No      Plan:     I have personally reviewed and noted the following in the patient's chart:   Medical and social history Use of alcohol, tobacco or illicit drugs  Current medications and supplements including opioid prescriptions. Patient is not currently taking opioid prescriptions. Functional ability and status Nutritional status Physical activity Advanced directives List of other physicians Hospitalizations, surgeries, and ER visits in previous 12 months Vitals Screenings to include cognitive, depression, and falls Referrals and appointments  In addition, I have reviewed and discussed with patient certain preventive protocols, quality metrics, and best practice recommendations. A written personalized care plan for preventive services as well as general preventive health recommendations were provided to patient.     Dionisio David, LPN   08/12/4578   Nurse Notes: none

## 2021-10-26 ENCOUNTER — Ambulatory Visit (INDEPENDENT_AMBULATORY_CARE_PROVIDER_SITE_OTHER): Payer: Medicare PPO | Admitting: Family Medicine

## 2021-10-26 ENCOUNTER — Encounter: Payer: Self-pay | Admitting: Family Medicine

## 2021-10-26 VITALS — BP 113/56 | HR 75 | Resp 18 | Wt 135.0 lb

## 2021-10-26 DIAGNOSIS — I10 Essential (primary) hypertension: Secondary | ICD-10-CM

## 2021-10-26 DIAGNOSIS — J479 Bronchiectasis, uncomplicated: Secondary | ICD-10-CM

## 2021-10-26 DIAGNOSIS — M150 Primary generalized (osteo)arthritis: Secondary | ICD-10-CM

## 2021-10-26 DIAGNOSIS — R2681 Unsteadiness on feet: Secondary | ICD-10-CM | POA: Diagnosis not present

## 2021-10-26 DIAGNOSIS — I25118 Atherosclerotic heart disease of native coronary artery with other forms of angina pectoris: Secondary | ICD-10-CM

## 2021-10-26 DIAGNOSIS — F331 Major depressive disorder, recurrent, moderate: Secondary | ICD-10-CM

## 2021-10-26 DIAGNOSIS — Z148 Genetic carrier of other disease: Secondary | ICD-10-CM

## 2021-10-26 DIAGNOSIS — M159 Polyosteoarthritis, unspecified: Secondary | ICD-10-CM

## 2021-10-26 NOTE — Progress Notes (Unsigned)
Established patient visit  I,Terri Braun,acting as a scribe for Terri Durie, MD.,have documented all relevant documentation on the behalf of Terri Durie, MD,as directed by  Terri Durie, MD while in the presence of Terri Durie, MD.   Patient: Terri Braun   DOB: Feb 24, 1929   88 y.o. Female  MRN: [id number] Visit Date: 10/26/2021  Today's healthcare provider: [name], MD   Chief Complaint  Patient presents with   Follow-up   Subjective    HPI  Overall patient feels well other than chronic musculoskeletal pain. She is having no chest pain or shortness of breath.  Hypertension, follow-up  BP Readings from Last 3 Encounters:  10/26/21 (!) 113/56  09/29/21 (!) 140/60  08/18/21 (!) 161/79   Wt Readings from Last 3 Encounters:  10/26/21 135 lb (61.2 kg)  09/29/21 140 lb 14.4 oz (63.9 kg)  08/18/21 137 lb (62.1 kg)     She was last seen for hypertension 2 months ago.  Management since that visit includes; taking losartan 38m.  Outside blood pressures are not checking.  Pertinent labs Lab Results  Component Value Date   CHOL 150 03/26/2021   HDL 79 03/26/2021   LDLCALC 52 03/26/2021   LDLDIRECT 47 04/23/2020   TRIG 105 03/26/2021   CHOLHDL 1.9 03/26/2021   Lab Results  Component Value Date   NA 141 03/26/2021   K 4.1 03/26/2021   CREATININE 1.10 (H) 03/26/2021   EGFR 48 (L) 03/26/2021   GLUCOSE 101 (H) 03/26/2021   TSH 2.910 03/26/2021     The ASCVD Risk score (Arnett DK, et al., 2019) failed to calculate for the following reasons:   The 2019 ASCVD risk score is only valid for ages 427to 726 ---------------------------------------------------------------------------------------------------   Medications: Outpatient Medications Prior to Visit  Medication Sig   acetaminophen (TYLENOL) 500 MG tablet Take 500 mg by mouth every 4 (four) hours as needed.   alendronate (FOSAMAX) 70 MG tablet alendronate 70 mg tablet   TAKE 1 TABLET BY MOUTH ONCE WEEKLY   celecoxib (CELEBREX) 100 MG capsule TAKE 1 CAPSULE BY MOUTH EVERY DAY   clopidogrel (PLAVIX) 75 MG tablet TAKE 1 TABLET BY MOUTH EVERY DAY   conjugated estrogens (PREMARIN) vaginal cream Apply one dab to Urethra daily at bedtime   Fluticasone-Umeclidin-Vilant (TRELEGY ELLIPTA) 100-62.5-25 MCG/INH AEPB Inhale 1 puff into the lungs daily. (Patient taking differently: Inhale 1 puff into the lungs as needed.)   isosorbide mononitrate (IMDUR) 30 MG 24 hr tablet TAKE 1 TABLET BY MOUTH EVERY DAY   losartan (COZAAR) 50 MG tablet Take 1 tablet by mouth daily.   nitroGLYCERIN (NITROSTAT) 0.4 MG SL tablet Place 1 tablet (0.4 mg total) under the tongue every 5 (five) minutes as needed for chest pain.   rosuvastatin (CRESTOR) 10 MG tablet TAKE 1 TABLET BY MOUTH EVERY DAY   [DISCONTINUED] amoxicillin-clavulanate (AUGMENTIN) 875-125 MG tablet Take 1 tablet by mouth 2 (two) times daily. (Patient not taking: Reported on 09/29/2021)   [DISCONTINUED] DULoxetine (CYMBALTA) 60 MG capsule TAKE 1 CAPSULE BY MOUTH EVERY DAY (Patient not taking: Reported on 09/29/2021)   No facility-administered medications prior to visit.    Review of Systems  Constitutional:  Negative for appetite change, chills, fatigue and fever.  Respiratory:  Negative for chest tightness and shortness of breath.   Cardiovascular:  Negative for chest pain and palpitations.  Gastrointestinal:  Negative for abdominal pain, nausea and vomiting.  Neurological:  Negative  for dizziness and weakness.    Last hemoglobin A1c No results found for: "HGBA1C"     Objective    BP (!) 113/56 (BP Location: Right Arm, Patient Position: Sitting, Cuff Size: Normal)   Pulse 75   Resp 18   Wt 135 lb (61.2 kg)   SpO2 97%   BMI 22.47 kg/m  BP Readings from Last 3 Encounters:  10/26/21 (!) 113/56  09/29/21 (!) 140/60  08/18/21 (!) 161/79   Wt Readings from Last 3 Encounters:  10/26/21 135 lb (61.2 kg)  09/29/21 140  lb 14.4 oz (63.9 kg)  08/18/21 137 lb (62.1 kg)      Physical Exam Vitals reviewed.  Constitutional:      Appearance: Normal appearance.  HENT:     Head: Normocephalic and atraumatic.     Mouth/Throat:     Pharynx: Oropharynx is clear.  Eyes:     Conjunctiva/sclera: Conjunctivae normal.  Cardiovascular:     Rate and Rhythm: Normal rate and regular rhythm.     Pulses: Normal pulses.     Heart sounds: Normal heart sounds. No murmur heard.    No friction rub. No gallop.  Pulmonary:     Effort: Pulmonary effort is normal. No respiratory distress.     Breath sounds: Normal breath sounds. No stridor. No wheezing, rhonchi or rales.  Chest:     Chest wall: No tenderness.  Abdominal:     Palpations: Abdomen is soft.     Tenderness: There is no right CVA tenderness, left CVA tenderness, guarding or rebound.  Musculoskeletal:     Cervical back: Normal range of motion and neck supple.  Skin:    General: Skin is warm and dry.  Neurological:     Mental Status: She is alert and oriented to person, place, and time. Mental status is at baseline.     Motor: No weakness.     Gait: Gait normal.  Psychiatric:        Mood and Affect: Mood normal.        Behavior: Behavior normal.        Thought Content: Thought content normal.        Judgment: Judgment normal.        No results found for any visits on 10/26/21.  Assessment & Plan     1. Atherosclerosis of native coronary artery of native heart with stable angina pectoris ([place]) Risk factors treated.  2. Primary hypertension Blood pressure control.  Consider cutting back on dose of losartan from 50-25 in the future  3. Bronchiectasis without complication ([place])   4. Gait instability Patient to remain as active as is safely possible  5. Carrier of alpha-1-antitrypsin deficiency   6. Moderate episode of recurrent major depressive disorder (HCC) Doing well on duloxetine  7. Primary osteoarthritis involving multiple joints Try  to limit Celebrex.  Use ice and heat and topicals for osteoarthritic pain   No follow-ups on file.      I, Terri Durie, MD, have reviewed all documentation for this visit. The documentation on 10/28/21 for the exam, diagnosis, procedures, and orders are all accurate and complete.    Timmya Blazier Cranford Mon, MD  [facility] [phone] (phone) [phone] (fax)  [place]

## 2021-10-29 ENCOUNTER — Other Ambulatory Visit: Payer: Self-pay | Admitting: Family Medicine

## 2021-10-29 DIAGNOSIS — I10 Essential (primary) hypertension: Secondary | ICD-10-CM

## 2021-10-29 NOTE — Telephone Encounter (Signed)
Unable to refill per protocol, Rx expired. Medication was discontinued. Will refuse request.  Requested Prescriptions  Pending Prescriptions Disp Refills  . losartan (COZAAR) 25 MG tablet [Pharmacy Med Name: LOSARTAN POTASSIUM 25 MG TAB] 90 tablet 1    Sig: TAKE 1 TABLET (25 MG TOTAL) BY MOUTH DAILY.     Cardiovascular:  Angiotensin Receptor Blockers Failed - 10/29/2021  1:46 AM      Failed - Cr in normal range and within 180 days    Creatinine, Ser  Date Value Ref Range Status  03/26/2021 1.10 (H) 0.57 - 1.00 mg/dL Final         Failed - K in normal range and within 180 days    Potassium  Date Value Ref Range Status  03/26/2021 4.1 3.5 - 5.2 mmol/L Final         Passed - Patient is not pregnant      Passed - Last BP in normal range    BP Readings from Last 1 Encounters:  10/26/21 (!) 113/56         Passed - Valid encounter within last 6 months    Recent Outpatient Visits          3 days ago Atherosclerosis of native coronary artery of native heart with stable angina pectoris [facility])   [facility] Jerrol Banana., MD   2 months ago Olecranon bursitis of right elbow   [facility] Jerrol Banana., MD   4 months ago Essential hypertension, benign   [facility] Jerrol Banana., MD   6 months ago Leg hematoma, left, initial encounter   [facility] Thedore Mins, [place], PA-C   7 months ago Essential hypertension, benign   [facility] Jerrol Banana., MD

## 2021-12-28 ENCOUNTER — Other Ambulatory Visit: Payer: Self-pay | Admitting: Cardiovascular Disease

## 2022-01-20 ENCOUNTER — Other Ambulatory Visit
Admission: RE | Admit: 2022-01-20 | Discharge: 2022-01-20 | Disposition: A | Discharge status: Home / Self Care | Payer: Medicare PPO | Source: Ambulatory Visit | Attending: Pulmonary Disease | Admitting: Pulmonary Disease

## 2022-01-20 ENCOUNTER — Ambulatory Visit (INDEPENDENT_AMBULATORY_CARE_PROVIDER_SITE_OTHER): Payer: Medicare PPO | Admitting: Pulmonary Disease

## 2022-01-20 ENCOUNTER — Encounter: Payer: Self-pay | Admitting: Pulmonary Disease

## 2022-01-20 VITALS — BP 128/78 | HR 68 | Temp 98.1°F | Ht 65.0 in | Wt 135.0 lb

## 2022-01-20 DIAGNOSIS — E8801 Alpha-1-antitrypsin deficiency: Secondary | ICD-10-CM

## 2022-01-20 DIAGNOSIS — J45909 Unspecified asthma, uncomplicated: Secondary | ICD-10-CM

## 2022-01-20 DIAGNOSIS — R0602 Shortness of breath: Secondary | ICD-10-CM | POA: Insufficient documentation

## 2022-01-20 LAB — NITRIC OXIDE: Nitric Oxide: 47

## 2022-01-20 MED ORDER — ALBUTEROL SULFATE HFA 108 (90 BASE) MCG/ACT IN AERS: 2.0000 | INHALATION_SPRAY | Freq: Four times a day (QID) | RESPIRATORY_TRACT | 2 refills | Status: DC | PRN

## 2022-01-20 MED ORDER — TRELEGY ELLIPTA 100-62.5-25 MCG/ACT IN AEPB: 1.0000 | INHALATION_SPRAY | Freq: Every day | RESPIRATORY_TRACT | 11 refills | Status: DC

## 2022-01-20 MED ORDER — TRELEGY ELLIPTA 100-62.5-25 MCG/ACT IN AEPB: 1.0000 | INHALATION_SPRAY | Freq: Every day | RESPIRATORY_TRACT | 0 refills | Status: DC

## 2022-01-20 NOTE — Progress Notes (Signed)
Subjective:    Patient ID: Terri Braun, female    DOB: [birth year], 88 y.o.   MRN: [id number] Patient Care Team: Jerrol Banana., MD as PCP - General (Family Medicine) Minna Merritts, MD as PCP - Cardiology (Cardiology)  Chief Complaint  Patient presents with   Follow-up    Hoarseness off and on for year. SOB off and on. No wheezing. No cough.   HPI Patient is a 88 year old lifelong never smoker who presents for evaluation of shortness of breath which "comes off and on".  She is kindly referred by Dr. Miguel Aschoff.  She was last seen [facility] in 2020 by Dr. Merton Border.  Last visit in the clinic was in February 2020 this was a televisit with Dr. Alva Garnet.  I have reviewed her available records.  Patient was diagnosed with "COPD" excellently 18 years ago.  She was also told she had alpha-1 antitrypsin deficiency but this apparently was based on a history of COPD and elevated liver function test.  This was in [facility].  In any event she has carried this diagnosis and was evaluated by Dr. Alva Garnet who did not feel she had COPD nor alpha-1 however alpha-1 levels phenotype has not been checked.  She had been given a trial of Trelegy and had been on Trelegy which helped her significantly.  However, she ran out of the medication "a while ago" she has noted that her symptoms of cough productive of whitish sputum, shortness of breath and nocturnal wheezing have returned and since she ran out of this medication.  She is triggered by strong odors particularly tobacco smoke.  She has not had any gastroesophageal reflux symptoms.  She does get "bronchitis" often.  This has gotten worse since running out of Trelegy.  She also notes intermittent hoarseness particularly in the mornings upon arising.  Seems to improve throughout the day.  Patient's daughter is with her today, she played back a voicemail message that the patient had left where the patient sounded  exceedingly dyspneic.  This apparently happens frequently according to the daughter.  Last echocardiogram was in 2022 and showed normal LVEF and mild aortic regurgitation.  DATA 03/20/2018 PFTs: FEV1 1.94 L or 104% predicted, FVC 2.47 L or 100% predicted, FEV1/FVC 76%,no bronchodilator response. 03/20/2018 chest x-ray: On my review there is hyperinflation, otherwise, no acute disease.  Review of Systems A 10 point review of systems was performed and it is as noted above otherwise negative.  Past Medical History:  Diagnosis Date   Anemia    ASCVD (arteriosclerotic cardiovascular disease)    COPD (chronic obstructive pulmonary disease) (HCC)    Diverticulosis    GERD (gastroesophageal reflux disease)    Hypertension    Hypothyroidism    Past Surgical History:  Procedure Laterality Date   APPENDECTOMY     BREAST SURGERY  1983   Lumpectomy   CHOLECYSTECTOMY     COLON SURGERY     COLONOSCOPY  11/04/2015   CORONARY ANGIOPLASTY WITH STENT PLACEMENT     NISSEN FUNDOPLICATION     TOTAL ABDOMINAL HYSTERECTOMY     With BSO   Patient Active Problem List   Diagnosis Date Noted   Olecranon bursitis of right elbow 08/23/2021   Leg hematoma, left, initial encounter 05/03/2021   Syncope 05/03/2021   Osteoarthritis of left knee 04/12/2021   Leukocytes in urine 12/13/2019   Urinary frequency 12/13/2019   Closed fracture of metatarsal bone 12/25/2018   Polyp of ascending  colon 02/16/2018   Diverticula, colon 02/16/2018   Primary hypothyroidism 02/16/2018   Essential hypertension, benign 02/16/2018   ASCVD (arteriosclerotic cardiovascular disease) 84/16/6063   Periumbilical abdominal pain 02/16/2018   GI bleed 02/16/2018   Iron deficiency anemia, unspecified 02/16/2018   Chronic liver disease 02/16/2018   Carrier of alpha-1-antitrypsin deficiency 02/16/2018   Personal history of colonic polyps 02/16/2018   Abnormal liver CT 02/16/2018   Elevated liver enzymes 02/16/2018   History  of hepatitis C 02/16/2018   Abnormal findings on imaging test 01/60/1093   Helicobacter pylori gastritis 02/16/2018   Gait instability 02/13/2018   Mixed hyperlipidemia 02/13/2018   Atherosclerosis of native coronary artery of native heart with stable angina pectoris ([place]) 02/12/2018   Centrilobular emphysema ([place]) 02/12/2018   Anemia 02/12/2018   Benign neoplasm of cecum 01/16/2018   Heterozygous alpha 1-antitrypsin deficiency ([place]) 01/16/2018   Gastroesophageal reflux disease 01/16/2018   Bronchiectasis without complication ([place]) 23/55/7322   Moderate episode of recurrent major depressive disorder ([place]) 04/14/2016   Bloody stool 09/15/2015   Left lower quadrant pain 09/15/2015   Long term (current) use of antithrombotics/antiplatelets 09/15/2015   Dyspnea 05/18/2015   Anxiety 11/14/2011   DJD (degenerative joint disease) 11/14/2011   Abnormal stress test 04/20/2010   Dyslipidemia 04/20/2010   Status post coronary artery stent placement 04/20/2010   CAD (coronary artery disease) 04/20/2010   Social History   Tobacco Use   Smoking status: Never   Smokeless tobacco: Never  Substance Use Topics   Alcohol use: Never   Allergies  Allergen Reactions   Shellfish Allergy Anaphylaxis    Ended up in the ED after eating shellfish, caused diarrhea and severe GI upset , unsure if caused SOB   Shellfish-Derived Products     Other reaction(s): Not available   Sulfa Antibiotics    Current Meds  Medication Sig   acetaminophen (TYLENOL) 500 MG tablet Take 500 mg by mouth every 4 (four) hours as needed.   alendronate (FOSAMAX) 70 MG tablet alendronate 70 mg tablet  TAKE 1 TABLET BY MOUTH ONCE WEEKLY   celecoxib (CELEBREX) 100 MG capsule TAKE 1 CAPSULE BY MOUTH EVERY DAY   clopidogrel (PLAVIX) 75 MG tablet TAKE 1 TABLET BY MOUTH EVERY DAY   conjugated estrogens (PREMARIN) vaginal cream Apply one dab to Urethra daily at bedtime   isosorbide mononitrate (IMDUR) 30 MG 24 hr tablet TAKE 1  TABLET BY MOUTH EVERY DAY   losartan (COZAAR) 50 MG tablet Take 1 tablet by mouth daily.   nitroGLYCERIN (NITROSTAT) 0.4 MG SL tablet Place 1 tablet (0.4 mg total) under the tongue every 5 (five) minutes as needed for chest pain.   rosuvastatin (CRESTOR) 10 MG tablet TAKE 1 TABLET BY MOUTH EVERY DAY   Immunization History  Administered Date(s) Administered   Fluad Quad(high Dose 65+) 10/24/2018   Influenza Split 11/01/2013   Influenza, High Dose Seasonal PF 10/28/2016, 11/03/2017   Influenza,inj,Quad PF,6+ Mos 12/05/2014   Influenza-Unspecified 10/14/2015, 10/28/2016, 10/31/2016, 10/01/2021   Pneumococcal Conjugate-13 07/14/2014   Pneumococcal Polysaccharide-23 07/29/2015      Objective:   Physical Exam BP 128/78 (BP Location: Right Arm, Cuff Size: Normal)   Pulse 68   Temp 98.1 F (36.7 C)   Ht '5\' 5"'$  (1.651 m)   Wt 135 lb (61.2 kg) Comment: last recorded weight. patient in a wheelchair  SpO2 97%   BMI 22.47 kg/m  GENERAL: Elderly woman, presents in transport chair, no conversational dyspnea, awake, alert and oriented. HEAD: Normocephalic, atraumatic.  EYES: Pupils equal, round, reactive to light.  No scleral icterus.  MOUTH: Dentition intact, oral mucosa moist NECK: Supple. No thyromegaly. Trachea midline. No JVD.  No adenopathy. PULMONARY: Good air entry bilaterally.  Coarse otherwise, no adventitious sounds. CARDIOVASCULAR: S1 and S2. Regular rate and rhythm.  No rubs, murmurs or gallops heard. ABDOMEN: Benign.   MUSCULOSKELETAL: No joint deformity, no clubbing, no edema.  NEUROLOGIC: Gait not tested, no overt focal deficit. SKIN: Intact,warm,dry. PSYCH: Mood and behavior normal.  Lab Results  Component Value Date   NITRICOXIDE 47 01/20/2022      Assessment & Plan:     ICD-10-CM   1. Shortness of breath  R06.02 Alpha-1 antitrypsin phenotype    Pulmonary Function Test [facility] Only    Nitric oxide   Suspect due to poorly controlled asthma Will obtain PFTs Resume  Trelegy as below    2. Persistent asthma without complication, unspecified asthma severity  J45.909 Pulmonary Function Test [facility] Only    Nitric oxide   FeNO 47 Patient's history consistent with persistent asthma Resume Trelegy 100 Albuterol as needed PFTs pending    3. Alpha-1-antitrypsin deficiency ([place])  E88.01 Alpha-1 antitrypsin phenotype    Pulmonary Function Test [facility] Only   Unclear by past records Will check alpha-1 phenotype     Orders Placed This Encounter  Procedures   Alpha-1 antitrypsin phenotype    Standing Status:   Future    Number of Occurrences:   1    Standing Expiration Date:   01/21/2023   Pulmonary Function Test [facility] Only    Standing Status:   Future    Standing Expiration Date:   01/21/2023    Order Specific Question:   Full PFT: includes the following: basic spirometry, spirometry pre & post bronchodilator, diffusion capacity (DLCO), lung volumes    Answer:   Full PFT    Order Specific Question:   This test can only be performed at    Answer:   [facility]   Nitric oxide   Meds ordered this encounter  Medications   Fluticasone-Umeclidin-Vilant (TRELEGY ELLIPTA) 100-62.5-25 MCG/ACT AEPB    Sig: Inhale 1 puff into the lungs daily.    Dispense:  14 each    Refill:  0    Order Specific Question:   Lot Number?    Answer:   [id number]    Order Specific Question:   Expiration Date?    Answer:   05/02/2023    Order Specific Question:   Quantity    Answer:   1   Fluticasone-Umeclidin-Vilant (TRELEGY ELLIPTA) 100-62.5-25 MCG/ACT AEPB    Sig: Inhale 1 puff into the lungs daily.    Dispense:  28 each    Refill:  11   albuterol (VENTOLIN HFA) 108 (90 Base) MCG/ACT inhaler    Sig: Inhale 2 puffs into the lungs every 6 (six) hours as needed for wheezing or shortness of breath.    Dispense:  8 g    Refill:  2   I suspect that the patient over the years has had persistent asthma.  She does have evidence of type II inflammation in the airway.  Will proceed with  reinstituting Trelegy therapy.  She also needs to have her alpha-1 status clarified if anything mostly for advised to the rest of the family.  Will obtain alpha-1 phenotype to clarify.  We will see the patient in follow-up in 6 to 8 weeks time she is to contact us prior to that time should any new difficulties  arise.  Renold Don, MD Advanced Bronchoscopy [facility]    *This note was dictated using voice recognition software/Dragon.  Despite best efforts to proofread, errors can occur which can change the meaning. Any transcriptional errors that result from this process are unintentional and may not be fully corrected at the time of dictation.

## 2022-01-20 NOTE — Patient Instructions (Addendum)
You have asthma.  In your situation this can mimic COPD because it is persistent.  It is not COPD.  This means that you need to stay on medication to keep the inflammation in your airways down.  You need to restart your Trelegy Ellipta.  You have given a sample that has 2 weeks worth.  We have also provided you with a coupon that should be used before the end of the year.  We also sent in a prescription for albuterol which is emergency inhaler you can use this inhaler up to 4 times a day if you notice shortness of breath or wheezing.  We have schedule breathing tests.  We have ordered a blood test to clarify the issue with your alpha-1.  We will see you in follow-up in 6 to 8 weeks time call sooner should any new problems arise.

## 2022-01-25 LAB — ALPHA-1-ANTITRYPSIN PHENOTYP: A-1 Antitrypsin, Ser: 120 mg/dL (ref 101–187)

## 2022-02-15 ENCOUNTER — Encounter: Payer: Self-pay | Admitting: Physician Assistant

## 2022-02-15 ENCOUNTER — Ambulatory Visit (INDEPENDENT_AMBULATORY_CARE_PROVIDER_SITE_OTHER): Payer: Medicare PPO | Admitting: Physician Assistant

## 2022-02-15 VITALS — BP 155/62 | HR 76 | Wt 132.5 lb

## 2022-02-15 DIAGNOSIS — Z148 Genetic carrier of other disease: Secondary | ICD-10-CM

## 2022-02-15 DIAGNOSIS — I10 Essential (primary) hypertension: Secondary | ICD-10-CM

## 2022-02-15 DIAGNOSIS — E039 Hypothyroidism, unspecified: Secondary | ICD-10-CM

## 2022-02-15 DIAGNOSIS — M25562 Pain in left knee: Secondary | ICD-10-CM

## 2022-02-15 DIAGNOSIS — I25118 Atherosclerotic heart disease of native coronary artery with other forms of angina pectoris: Secondary | ICD-10-CM

## 2022-02-15 DIAGNOSIS — I2510 Atherosclerotic heart disease of native coronary artery without angina pectoris: Secondary | ICD-10-CM

## 2022-02-15 DIAGNOSIS — E782 Mixed hyperlipidemia: Secondary | ICD-10-CM

## 2022-02-15 DIAGNOSIS — J432 Centrilobular emphysema: Secondary | ICD-10-CM

## 2022-02-15 DIAGNOSIS — J479 Bronchiectasis, uncomplicated: Secondary | ICD-10-CM

## 2022-02-15 NOTE — Progress Notes (Signed)
I,Sha'taria Tyson,acting as a Education administrator for [employer], PA-C.,have documented all relevant documentation on the behalf of Mardene Speak, PA-C,as directed by  [employer], PA-C while in the presence of [employer], PA-C.   Established patient visit   Patient: Terri Braun   DOB: 1929-08-31   89 y.o. Female  MRN: [id number] Visit Date: 02/15/2022  Today's healthcare provider: Mardene Speak, PA-C   CC: HTN FU  Subjective    HPI  Hypertension, follow-up  BP Readings from Last 3 Encounters:  01/20/22 128/78  10/26/21 (!) 113/56  09/29/21 (!) 140/60   Wt Readings from Last 3 Encounters:  01/20/22 135 lb (61.2 kg)  10/26/21 135 lb (61.2 kg)  09/29/21 140 lb 14.4 oz (63.9 kg)     She was last seen for hypertension 3 months ago.  BP at that visit was 113/56. Management since that visit includes continue current treatment and consider cutting back on dose of losartan from 50-25 in the future .  She reports excellent compliance with treatment. She is not having side effects.  Outside blood pressures are unknown. Symptoms: No chest pain No chest pressure  No palpitations Yes syncope  No dyspnea No orthopnea  No paroxysmal nocturnal dyspnea No lower extremity edema   Pertinent labs Lab Results  Component Value Date   CHOL 150 03/26/2021   HDL 79 03/26/2021   LDLCALC 52 03/26/2021   LDLDIRECT 47 04/23/2020   TRIG 105 03/26/2021   CHOLHDL 1.9 03/26/2021   Lab Results  Component Value Date   NA 141 03/26/2021   K 4.1 03/26/2021   CREATININE 1.10 (H) 03/26/2021   EGFR 48 (L) 03/26/2021   GLUCOSE 101 (H) 03/26/2021   TSH 2.910 03/26/2021     The ASCVD Risk score (Arnett DK, et al., 2019) failed to calculate for the following reasons:   The 2019 ASCVD risk score is only valid for ages 23 to 47  ---------------------------------------------------------------------------------------------------  Medications: Outpatient Medications Prior to Visit  Medication Sig    acetaminophen (TYLENOL) 500 MG tablet Take 500 mg by mouth every 4 (four) hours as needed.   albuterol (VENTOLIN HFA) 108 (90 Base) MCG/ACT inhaler Inhale 2 puffs into the lungs every 6 (six) hours as needed for wheezing or shortness of breath.   alendronate (FOSAMAX) 70 MG tablet alendronate 70 mg tablet  TAKE 1 TABLET BY MOUTH ONCE WEEKLY   celecoxib (CELEBREX) 100 MG capsule TAKE 1 CAPSULE BY MOUTH EVERY DAY   clopidogrel (PLAVIX) 75 MG tablet TAKE 1 TABLET BY MOUTH EVERY DAY   conjugated estrogens (PREMARIN) vaginal cream Apply one dab to Urethra daily at bedtime   Fluticasone-Umeclidin-Vilant (TRELEGY ELLIPTA) 100-62.5-25 MCG/ACT AEPB Inhale 1 puff into the lungs daily.   Fluticasone-Umeclidin-Vilant (TRELEGY ELLIPTA) 100-62.5-25 MCG/ACT AEPB Inhale 1 puff into the lungs daily.   isosorbide mononitrate (IMDUR) 30 MG 24 hr tablet TAKE 1 TABLET BY MOUTH EVERY DAY   losartan (COZAAR) 50 MG tablet Take 1 tablet by mouth daily.   nitroGLYCERIN (NITROSTAT) 0.4 MG SL tablet Place 1 tablet (0.4 mg total) under the tongue every 5 (five) minutes as needed for chest pain.   rosuvastatin (CRESTOR) 10 MG tablet TAKE 1 TABLET BY MOUTH EVERY DAY   No facility-administered medications prior to visit.    Review of Systems  All other systems reviewed and are negative. Except see HPI     Objective    There were no vitals taken for this visit.   Physical Exam Vitals reviewed.  Constitutional:      General: She is not in acute distress.    Appearance: Normal appearance. She is well-developed. She is not diaphoretic.  HENT:     Head: Normocephalic and atraumatic.  Eyes:     General: No scleral icterus.    Conjunctiva/sclera: Conjunctivae normal.  Neck:     Thyroid: No thyromegaly.  Cardiovascular:     Rate and Rhythm: Normal rate and regular rhythm.     Pulses: Normal pulses.     Heart sounds: Normal heart sounds. No murmur heard. Pulmonary:     Effort: Pulmonary effort is normal. No  respiratory distress.     Breath sounds: Normal breath sounds. No wheezing, rhonchi or rales.  Musculoskeletal:     Cervical back: Neck supple.     Right lower leg: No edema.     Left lower leg: No edema.  Lymphadenopathy:     Cervical: No cervical adenopathy.  Skin:    General: Skin is warm and dry.     Findings: No rash.  Neurological:     Mental Status: She is alert and oriented to person, place, and time. Mental status is at baseline.  Psychiatric:        Behavior: Behavior normal.        Thought Content: Thought content normal.        Judgment: Judgment normal.       No results found for any visits on 02/15/22.  Assessment & Plan     There are no diagnoses linked to this encounter. 1. Primary hypertension Chronic and not stable BP today was 155/62, pt just received a steroid injection/ left knee at the Ortho clinic Could be due to p procedure vs p  Pt was advised to start BP log/ambulatory monitoring Continue her current medications: losartan '50mg'$   In the past, it was decreased from 100 to 25 mg for hypotension Continue imdur '30mg'$  daily Per chart review from 07/2021 Encouraged low-salt diet and daialy exercise as tolerated. Will FU in 2 weeks  In the settings of Atherosclerosis of native coronary artery of native heart with stable angina pectoris (HCC) ASCVD (arteriosclerotic cardiovascular disease) Continue crestor and plavix. Acute pain of left knee Mixed hyperlipidemia Primary hypothyroidism Carrier of alpha-1-antitrypsin deficiency Bronchiectasis without complication ([place]) Centrilobular emphysema ([place]) Per chart review, suspected poorly controlled asthma Pt was started on trelegy, albuterol as needed PFT pending Alpha-1 phenotype pending Pt was seen by Dr. Duwayne Heck recently on 01/20/22 Her medication were adjusted.: albuterol, trelegy '100mg'$  She will see Dr. Duwayne Heck in February of 2024  The patient was advised to call back or seek an in-person  evaluation if the symptoms worsen or if the condition fails to improve as anticipated.  I discussed the assessment and treatment plan with the patient. The patient was provided an opportunity to ask questions and all were answered. The patient agreed with the plan and demonstrated an understanding of the instructions.  The entirety of the information documented in the History of Present Illness, Review of Systems and Physical Exam were personally obtained by me. Portions of this information were initially documented by the CMA and reviewed by me for thoroughness and accuracy.  Mardene Speak, [facility], [place] [phone] (phone) [phone] (fax)

## 2022-02-17 DIAGNOSIS — I10 Essential (primary) hypertension: Secondary | ICD-10-CM | POA: Insufficient documentation

## 2022-02-24 ENCOUNTER — Ambulatory Visit: Payer: Medicare PPO | Attending: Pulmonary Disease

## 2022-02-24 DIAGNOSIS — J45909 Unspecified asthma, uncomplicated: Secondary | ICD-10-CM | POA: Diagnosis present

## 2022-02-24 DIAGNOSIS — E8801 Alpha-1-antitrypsin deficiency: Secondary | ICD-10-CM

## 2022-02-24 DIAGNOSIS — R0602 Shortness of breath: Secondary | ICD-10-CM | POA: Diagnosis not present

## 2022-02-24 LAB — PULMONARY FUNCTION TEST ARMC ONLY
DL/VA % pred: 74 %
DL/VA: 2.98 ml/min/mmHg/L
DLCO unc % pred: 70 %
DLCO unc: 13.33 ml/min/mmHg
RV % pred: 107 %
RV: 2.83 L
TLC % pred: 98 %
TLC: 5.15 L

## 2022-02-24 MED ORDER — ALBUTEROL SULFATE (2.5 MG/3ML) 0.083% IN NEBU
2.5000 mg | INHALATION_SOLUTION | Freq: Once | RESPIRATORY_TRACT | Status: AC
  Administered 2022-02-24: 2.5 mg via RESPIRATORY_TRACT
  Filled 2022-02-24: qty 3

## 2022-02-27 NOTE — Progress Notes (Deleted)
      Established patient visit   Patient: Terri Braun   DOB: 1929-11-13   89 y.o. Female  MRN: [id number] Visit Date: 03/01/2022  Today's healthcare provider: Mardene Speak, PA-C   No chief complaint on file.  Subjective    HPI  ***  Medications: Outpatient Medications Prior to Visit  Medication Sig   acetaminophen (TYLENOL) 500 MG tablet Take 500 mg by mouth every 4 (four) hours as needed.   albuterol (VENTOLIN HFA) 108 (90 Base) MCG/ACT inhaler Inhale 2 puffs into the lungs every 6 (six) hours as needed for wheezing or shortness of breath.   alendronate (FOSAMAX) 70 MG tablet alendronate 70 mg tablet  TAKE 1 TABLET BY MOUTH ONCE WEEKLY   celecoxib (CELEBREX) 100 MG capsule TAKE 1 CAPSULE BY MOUTH EVERY DAY   clopidogrel (PLAVIX) 75 MG tablet TAKE 1 TABLET BY MOUTH EVERY DAY   conjugated estrogens (PREMARIN) vaginal cream Apply one dab to Urethra daily at bedtime   Fluticasone-Umeclidin-Vilant (TRELEGY ELLIPTA) 100-62.5-25 MCG/ACT AEPB Inhale 1 puff into the lungs daily.   isosorbide mononitrate (IMDUR) 30 MG 24 hr tablet TAKE 1 TABLET BY MOUTH EVERY DAY   losartan (COZAAR) 50 MG tablet Take 1 tablet by mouth daily.   nitroGLYCERIN (NITROSTAT) 0.4 MG SL tablet Place 1 tablet (0.4 mg total) under the tongue every 5 (five) minutes as needed for chest pain.   rosuvastatin (CRESTOR) 10 MG tablet TAKE 1 TABLET BY MOUTH EVERY DAY   No facility-administered medications prior to visit.    Review of Systems  {Labs  Heme  Chem  Endocrine  Serology  Results Review (optional):23779}   Objective    There were no vitals taken for this visit. {Show previous vital signs (optional):23777}  Physical Exam  ***  No results found for any visits on 03/01/22.  Assessment & Plan     ***  No follow-ups on file.      {provider attestation***:1}   Mardene Speak, PA-C  [place] [phone] (phone) [phone] (fax)  [place]

## 2022-03-01 ENCOUNTER — Ambulatory Visit: Payer: Medicare PPO | Admitting: Physician Assistant

## 2022-03-01 DIAGNOSIS — I10 Essential (primary) hypertension: Secondary | ICD-10-CM

## 2022-03-10 ENCOUNTER — Ambulatory Visit: Payer: Medicare PPO | Admitting: Pulmonary Disease

## 2022-03-10 ENCOUNTER — Encounter: Payer: Self-pay | Admitting: Pulmonary Disease

## 2022-03-10 VITALS — BP 150/62 | HR 65 | Temp 98.1°F | Ht 65.0 in | Wt 132.0 lb

## 2022-03-10 DIAGNOSIS — E8801 Alpha-1-antitrypsin deficiency: Secondary | ICD-10-CM | POA: Diagnosis not present

## 2022-03-10 DIAGNOSIS — R0602 Shortness of breath: Secondary | ICD-10-CM

## 2022-03-10 DIAGNOSIS — J4540 Moderate persistent asthma, uncomplicated: Secondary | ICD-10-CM | POA: Diagnosis not present

## 2022-03-10 NOTE — Patient Instructions (Signed)
We are ordering an echocardiogram to evaluate your heart.  We are ordering an overnight oxygen level to check on your oxygen level during sleep.  Continue the Trelegy for now.  We will see you in follow-up in 3 to 4 months time call sooner should any new problems arise.

## 2022-03-10 NOTE — Progress Notes (Deleted)
   Subjective:    Patient ID: Terri Braun, female    DOB: January 18, 1930, 89 y.o.   MRN: [id number]  HPI    Review of Systems     Objective:   Physical Exam        Assessment & Plan:

## 2022-03-10 NOTE — Progress Notes (Signed)
Subjective:    Patient ID: Terri Braun, female    DOB: 11/16/1929, 89 y.o.   MRN: [id number] Patient Care Team: Mardene Speak, Hershal Coria as PCP - General (Physician Assistant) Minna Merritts, MD as PCP - Cardiology (Cardiology)  Chief Complaint  Patient presents with   Follow-up    DOE. Cough at night. No wheezing.    HPI Patient is a 89 year old lifelong never smoker who presents for follow-up of shortness of breath which "comes off and on".  Previously she had been seen [facility] in 2020 by Dr. Merton Border.  I first saw her on 20 January 2022. Patient was diagnosed with "COPD" 18 years ago.  She was also told she had alpha-1 antitrypsin deficiency but this apparently was based on a history of COPD and elevated liver function test.  This was in [facility].  In any event she has carried this diagnosis and was evaluated by Dr. Alva Garnet who did not feel she had COPD nor alpha-1 however ,alpha-1 levels phenotype had not been checked.  At her initial visit with me she was noted to have elevated nitric oxide levels of 47 ppb and was given a trial of Trelegy Ellipta for suspected asthma.  We also requested alpha-1 studies.  She had PFTs on 25 January which unfortunately she could not complete due to inability to perform the maneuvers.  It appears that she has a very minimal diffusion defect but we could not get any data on FEV1 or FVC due to the patient's inability to perform the test.  Her diffusion capacity appeared improved from prior.  She has been back on Trelegy Ellipta which previously had worked for her well.  She notes that she feels better she has no wheezing.  She has noted cough at nighttime this is a chronic issue is usually dry and only when she is recumbent.  She has dyspnea on exertion but her daughter who is present with her notes that she is totally sedentary.  She believes most of the issues with her mother are due to deconditioning.  Overall her nocturnal  cough also appears somewhat improved to her starting the Trelegy.  She uses albuterol 2-3 times per week.  Since her prior visit the daughter has not noted her mother to be severely dyspneic as she had noted previously.  Previously she had an echocardiogram that showed diastolic dysfunction 123456 but this has not been rechecked.  At her prior visit we requested alpha-1 phenotype and level she is phenotype MZ with a level of 120 mg/dL.  This means that she is a carrier for disease.  This was conveyed to her and her daughter so they can do the appropriate screening in the family.  She does not endorse any other symptomatology today.   DATA 03/20/2018 PFTs: FEV1 1.94 L or 104% predicted, FVC 2.47 L or 100% predicted, FEV1/FVC 76%,no bronchodilator response. 03/20/2018 chest x-ray: On my review there is hyperinflation, otherwise, no acute disease.  Review of Systems A 10 point review of systems was performed and it is as noted above otherwise negative.  Patient Active Problem List   Diagnosis Date Noted   Moderate persistent asthma without complication [id number]   Primary hypertension 02/17/2022   Olecranon bursitis of right elbow 08/23/2021   Leg hematoma, left, initial encounter 05/03/2021   Syncope 05/03/2021   Osteoarthritis of left knee 04/12/2021   Leukocytes in urine 12/13/2019   Urinary frequency 12/13/2019   Closed fracture of metatarsal bone 12/25/2018  Polyp of ascending colon 02/16/2018   Diverticula, colon 02/16/2018   Primary hypothyroidism 02/16/2018   Essential hypertension, benign 02/16/2018   ASCVD (arteriosclerotic cardiovascular disease) A999333   Periumbilical abdominal pain 02/16/2018   GI bleed 02/16/2018   Iron deficiency anemia, unspecified 02/16/2018   Chronic liver disease 02/16/2018   Carrier of alpha-1-antitrypsin deficiency 02/16/2018   Personal history of colonic polyps 02/16/2018   Abnormal liver CT 02/16/2018   Elevated liver enzymes 02/16/2018    History of hepatitis C 02/16/2018   Abnormal findings on imaging test A999333   Helicobacter pylori gastritis 02/16/2018   Gait instability 02/13/2018   Mixed hyperlipidemia 02/13/2018   Atherosclerosis of native coronary artery of native heart with stable angina pectoris ([place]) 02/12/2018   Centrilobular emphysema ([place]) 02/12/2018   Anemia 02/12/2018   Benign neoplasm of cecum 01/16/2018   Heterozygous alpha 1-antitrypsin deficiency ([place]) 01/16/2018   Gastroesophageal reflux disease 01/16/2018   Bronchiectasis without complication ([place]) [id number]   Moderate episode of recurrent major depressive disorder ([place]) 04/14/2016   Bloody stool 09/15/2015   Left lower quadrant pain 09/15/2015   Long term (current) use of antithrombotics/antiplatelets 09/15/2015   Dyspnea 05/18/2015   Anxiety 11/14/2011   DJD (degenerative joint disease) 11/14/2011   Abnormal stress test 04/20/2010   Dyslipidemia 04/20/2010   Status post coronary artery stent placement 04/20/2010   CAD (coronary artery disease) 04/20/2010   Social History   Tobacco Use   Smoking status: Never   Smokeless tobacco: Never  Substance Use Topics   Alcohol use: Never   Allergies  Allergen Reactions   Shellfish Allergy Anaphylaxis    Ended up in the ED after eating shellfish, caused diarrhea and severe GI upset , unsure if caused SOB   Shellfish-Derived Products     Other reaction(s): Not available   Sulfa Antibiotics    Current Meds  Medication Sig   acetaminophen (TYLENOL) 500 MG tablet Take 500 mg by mouth every 4 (four) hours as needed.   albuterol (VENTOLIN HFA) 108 (90 Base) MCG/ACT inhaler Inhale 2 puffs into the lungs every 6 (six) hours as needed for wheezing or shortness of breath.   alendronate (FOSAMAX) 70 MG tablet alendronate 70 mg tablet  TAKE 1 TABLET BY MOUTH ONCE WEEKLY   celecoxib (CELEBREX) 100 MG capsule TAKE 1 CAPSULE BY MOUTH EVERY DAY   clopidogrel (PLAVIX) 75 MG tablet TAKE 1 TABLET BY  MOUTH EVERY DAY   conjugated estrogens (PREMARIN) vaginal cream Apply one dab to Urethra daily at bedtime   DULoxetine (CYMBALTA) 60 MG capsule Take 60 mg by mouth daily.   Fluticasone-Umeclidin-Vilant (TRELEGY ELLIPTA) 100-62.5-25 MCG/ACT AEPB Inhale 1 puff into the lungs daily.   isosorbide mononitrate (IMDUR) 30 MG 24 hr tablet TAKE 1 TABLET BY MOUTH EVERY DAY   losartan (COZAAR) 50 MG tablet Take 1 tablet by mouth daily.   nitroGLYCERIN (NITROSTAT) 0.4 MG SL tablet Place 1 tablet (0.4 mg total) under the tongue every 5 (five) minutes as needed for chest pain.   rosuvastatin (CRESTOR) 10 MG tablet TAKE 1 TABLET BY MOUTH EVERY DAY   Immunization History  Administered Date(s) Administered   Fluad Quad(high Dose 65+) 10/24/2018   Influenza Split 11/01/2013   Influenza, High Dose Seasonal PF 10/28/2016, 11/03/2017   Influenza,inj,Quad PF,6+ Mos 12/05/2014   Influenza-Unspecified 10/14/2015, 10/28/2016, 10/31/2016, 10/01/2021   Pneumococcal Conjugate-13 07/14/2014   Pneumococcal Polysaccharide-23 07/29/2015      Objective:   Physical Exam BP (!) 150/62 (BP Location: Right Arm, Cuff Size:  Normal)   Pulse 65   Temp 98.1 F (36.7 C)   Ht 5' 5"$  (1.651 m)   Wt 132 lb (59.9 kg)   SpO2 98%   BMI 21.97 kg/m   SpO2: 98 % O2 Device: None (Room air)  GENERAL: Elderly woman, presents in transport chair, no conversational dyspnea, awake, alert and oriented. HEAD: Normocephalic, atraumatic.  EYES: Pupils equal, round, reactive to light.  No scleral icterus.  MOUTH: Dentition intact, oral mucosa moist NECK: Supple. No thyromegaly. Trachea midline. No JVD.  No adenopathy. PULMONARY: Good air entry bilaterally.  Coarse otherwise, no adventitious sounds. CARDIOVASCULAR: S1 and S2. Regular rate and rhythm.  No rubs, murmurs or gallops heard. ABDOMEN: Benign.   MUSCULOSKELETAL: No joint deformity, no clubbing, no edema.  NEUROLOGIC: Gait not tested, no overt focal deficit. SKIN:  Intact,warm,dry. PSYCH: Mood and behavior normal.     Assessment & Plan:     ICD-10-CM   1. Shortness of breath  R06.02 ECHOCARDIOGRAM COMPLETE   Will check echocardiogram to rule out potential cardiac etiologies Check overnight oximetry    2. Moderate persistent asthma without complication  123456 Pulse oximetry, overnight   Continue Trelegy Ellipta Continue as needed albuterol    3. Heterozygous alpha 1-antitrypsin deficiency (HCC)  E88.01 Pulse oximetry, overnight   MZ phenotype Level 120 Carrier, no disease     Orders Placed This Encounter  Procedures   Pulse oximetry, overnight    Room Air & New Start    Standing Status:   Future    Standing Expiration Date:   03/11/2023   ECHOCARDIOGRAM COMPLETE    Standing Status:   Future    Standing Expiration Date:   03/11/2023    Order Specific Question:   Where should this test be performed    Answer:   [facility]    Order Specific Question:   Perflutren DEFINITY (image enhancing agent) should be administered unless hypersensitivity or allergy exist    Answer:   Administer Perflutren    Order Specific Question:   Reason for exam-Echo    Answer:   Dyspnea  R06.00   Will see the patient in follow-up in 3 to 4 months time she is to call sooner should any new problems arise.  Renold Don, MD Advanced Bronchoscopy PCCM [facility]    *This note was dictated using voice recognition software/Dragon.  Despite best efforts to proofread, errors can occur which can change the meaning. Any transcriptional errors that result from this process are unintentional and may not be fully corrected at the time of dictation.

## 2022-03-12 ENCOUNTER — Encounter: Payer: Self-pay | Admitting: Pulmonary Disease

## 2022-04-08 ENCOUNTER — Telehealth: Payer: Self-pay

## 2022-04-08 NOTE — Telephone Encounter (Signed)
ONO reviewed by Dr. Patsey Berthold- does not need O2 during sleep.  Patient is aware of results and voiced her understanding. Nothing further needed.

## 2022-04-24 ENCOUNTER — Emergency Department
Admission: EM | Admit: 2022-04-24 | Discharge: 2022-04-24 | Disposition: A | Discharge status: Home / Self Care | Payer: Medicare PPO | Attending: Emergency Medicine | Admitting: Emergency Medicine

## 2022-04-24 ENCOUNTER — Emergency Department: Payer: Medicare PPO

## 2022-04-24 ENCOUNTER — Other Ambulatory Visit: Payer: Self-pay

## 2022-04-24 DIAGNOSIS — R519 Headache, unspecified: Secondary | ICD-10-CM | POA: Diagnosis not present

## 2022-04-24 DIAGNOSIS — W19XXXA Unspecified fall, initial encounter: Secondary | ICD-10-CM

## 2022-04-24 DIAGNOSIS — S3992XA Unspecified injury of lower back, initial encounter: Secondary | ICD-10-CM | POA: Insufficient documentation

## 2022-04-24 DIAGNOSIS — W0110XA Fall on same level from slipping, tripping and stumbling with subsequent striking against unspecified object, initial encounter: Secondary | ICD-10-CM | POA: Diagnosis not present

## 2022-04-24 DIAGNOSIS — M533 Sacrococcygeal disorders, not elsewhere classified: Secondary | ICD-10-CM | POA: Diagnosis present

## 2022-04-24 MED ORDER — TRAMADOL HCL 50 MG PO TABS
50.0000 mg | ORAL_TABLET | Freq: Once | ORAL | Status: DC
  Filled 2022-04-24: qty 1

## 2022-04-24 MED ORDER — FENTANYL CITRATE PF 50 MCG/ML IJ SOSY
50.0000 ug | PREFILLED_SYRINGE | Freq: Once | INTRAMUSCULAR | Status: AC
  Administered 2022-04-24: 50 ug via INTRAVENOUS
  Filled 2022-04-24: qty 1

## 2022-04-24 MED ORDER — OXYCODONE HCL 5 MG PO TABS: 5.0000 mg | ORAL_TABLET | Freq: Four times a day (QID) | ORAL | 0 refills | Status: DC | PRN

## 2022-04-24 NOTE — ED Provider Notes (Signed)
[facility] Provider Note    Event Date/Time   First MD Initiated Contact with Patient 04/24/22 1707     (approximate)   History   Tailbone pain   HPI  Terri Braun is a 89 y.o. female  who presents to the emergency department today because of concern for continued tailbone pain.  The patient states that she had a fall 4 days ago.  Apparently she was using her walker but was going a little bit too fast.  She did land onto her tailbone and hit her head.  She states that she initially had pain in both areas although the headache has improved the tailbone pain continued.  She states it does hurt when she sits down.  She denies any change or difficulty with defecation or urination. Has tried tylenol at home without any relief.       Physical Exam   Triage Vital Signs: ED Triage Vitals  Enc Vitals Group     BP 04/24/22 1652 (!) 141/96     Pulse Rate 04/24/22 1652 70     Resp 04/24/22 1652 16     Temp 04/24/22 1652 98.5 F (36.9 C)     Temp Source 04/24/22 1652 Oral     SpO2 04/24/22 1652 98 %     Weight 04/24/22 1651 132 lb 4.4 oz (60 kg)     Height 04/24/22 1651 5\' 5"  (1.651 m)     Head Circumference --      Peak Flow --      Pain Score 04/24/22 1651 10     Pain Loc --      Pain Edu? --      Excl. in [place]? --     Most recent vital signs: Vitals:   04/24/22 1652  BP: (!) 141/96  Pulse: 70  Resp: 16  Temp: 98.5 F (36.9 C)  SpO2: 98%   General: Awake, alert, oriented. CV:  Good peripheral perfusion.  Resp:  Normal effort. Lungs clear. Abd:  No distention.    ED Results / Procedures / Treatments   Labs (all labs ordered are listed, but only abnormal results are displayed) Labs Reviewed - No data to display   EKG  None   RADIOLOGY I independently interpreted and visualized the CT head/cervical spine. My interpretation: No intracranial bleed Radiology interpretation:  IMPRESSION:  1. No acute intracranial abnormality.  2. No  acute displaced fracture or traumatic listhesis of the  cervical spine.  3. Right upper lobe cavitary lesion measuring 1.2 x 1 cm. Associated  satellite pulmonary micronodules. Finding could be malignant or  infectious. Recommend CT chest with intravenous contrast for further  evaluation.  4. Multilevel severe degenerative changes of the spine. Associated  multilevel severe osseous neural foraminal stenosis.   I independently interpreted and visualized the lumbar spine. My interpretation: No fracture Radiology interpretation:  IMPRESSION:  Multilevel degenerative disc and facet disease changes of lumbar  spine with levoconvex thoracolumbar scoliosis.    No acute osseous abnormalities.    Aortic Atherosclerosis (ICD10-I70.0).    I independently interpreted and visualized the pelvic x-ray. My interpretation: No fracture Radiology interpretation:  IMPRESSION:  Degenerative disc and facet disease changes at lower lumbar spine.    No acute abnormalities.      PROCEDURES:  Critical Care performed: No    MEDICATIONS ORDERED IN ED: Medications - No data to display   IMPRESSION / MDM / [place] / ED COURSE  I reviewed the  triage vital signs and the nursing notes.                              Differential diagnosis includes, but is not limited to, fracture, dislocation, intracranial bleed, contusion.  Patient's presentation is most consistent with acute presentation with potential threat to life or bodily function.   Patient presented to the emergency department today because of concerns for pain to her tailbone after a fall a few days ago.  On exam patient is awake and alert.  CT head and cervical spine without concerning findings for bleed or fracture.  Did x-ray of the pelvis and lumbar spine without any concerning fractures.  At this time I do think patient likely suffering from contusion. Patient denies any pain radiating down legs or problems with defecation  or urination, doubt spinal cord involvement.  Will plan on giving patient prescription for pain medication.     FINAL CLINICAL IMPRESSION(S) / ED DIAGNOSES   Final diagnoses:  Fall, initial encounter  Injury of coccyx, initial encounter     Note:  This document was prepared using Dragon voice recognition software and may include unintentional dictation errors.    Nance Pear, MD 04/24/22 [phone]

## 2022-04-24 NOTE — ED Triage Notes (Addendum)
Pt to ED from home with her family for a fall that occurred Wednesday. Pt hit her head during the fall.  EMS was called to the residence but pt refused transport Wednesday. Pt is on blood thinners. Pt is CAOx4 and in no acute distress. Pt complains of pain in her tail bone. Daughter denies LOC during the fall.

## 2022-04-24 NOTE — Discharge Instructions (Signed)
Please seek medical attention for any high fevers, chest pain, shortness of breath, change in behavior, persistent vomiting, bloody stool or any other new or concerning symptoms.  

## 2022-04-24 NOTE — ED Notes (Signed)
Sent down blue, purple and green

## 2022-04-25 ENCOUNTER — Other Ambulatory Visit: Payer: Self-pay | Admitting: Physician Assistant

## 2022-04-25 NOTE — Telephone Encounter (Signed)
Medication Refill - Medication:  isosorbide mononitrate (IMDUR) 30 MG 24 hr tablet   Has the patient contacted their pharmacy? Yes.   Pharmacy referred pt to PCP  Preferred Pharmacy (with phone number or street name): [id number] - [place], [place]  Phone: [phone] Fax: [phone]  Has the patient been seen for an appointment in the last year OR does the patient have an upcoming appointment? Yes.    Agent: Please be advised that RX refills may take up to 3 business days. We ask that you follow-up with your pharmacy.

## 2022-04-26 MED ORDER — ISOSORBIDE MONONITRATE ER 30 MG PO TB24: 30.0000 mg | ORAL_TABLET | Freq: Every day | ORAL | 1 refills | Status: DC

## 2022-04-26 NOTE — Telephone Encounter (Signed)
Requested Prescriptions  Pending Prescriptions Disp Refills   isosorbide mononitrate (IMDUR) 30 MG 24 hr tablet 90 tablet 1    Sig: Take 1 tablet (30 mg total) by mouth daily.     Cardiovascular:  Nitrates Failed - 04/25/2022 10:55 AM      Failed - Last BP in normal range    BP Readings from Last 1 Encounters:  04/24/22 (!) 145/80         Passed - Last Heart Rate in normal range    Pulse Readings from Last 1 Encounters:  04/24/22 77         Passed - Valid encounter within last 12 months    Recent Outpatient Visits           2 months ago Primary hypertension   [place], [place], PA-C   6 months ago Atherosclerosis of native coronary artery of native heart with stable angina pectoris [facility])   [place] Eulas Post, MD   8 months ago Olecranon bursitis of right elbow   [place] Eulas Post, MD   10 months ago Essential hypertension, benign   [place] Eulas Post, MD   12 months ago Leg hematoma, left, initial encounter   [facility] Mikey Kirschner, [place]

## 2022-04-27 ENCOUNTER — Other Ambulatory Visit: Payer: Self-pay | Admitting: Pulmonary Disease

## 2022-04-27 ENCOUNTER — Ambulatory Visit: Payer: Medicare PPO | Attending: Pulmonary Disease

## 2022-04-27 DIAGNOSIS — R0602 Shortness of breath: Secondary | ICD-10-CM | POA: Diagnosis not present

## 2022-04-27 LAB — ECHOCARDIOGRAM COMPLETE

## 2022-04-28 ENCOUNTER — Telehealth: Payer: Self-pay

## 2022-04-28 DIAGNOSIS — J984 Other disorders of lung: Secondary | ICD-10-CM

## 2022-04-28 NOTE — Telephone Encounter (Signed)
Recommend getting a chest CT to evaluate this area.  Diagnosis: Cavitary lesion of the right upper lobe.  We will get the chest CT without contrast as her kidneys have mild dysfunction related to age.  Once that CT is done we can then advise of whether the appointment needs to be moved up.

## 2022-04-28 NOTE — Telephone Encounter (Signed)
-----   Message from Tyler Pita, MD sent at 04/28/2022  9:08 AM EDT ----- Her heart function is stable according to the most recent test.  This is good.  No evidence of increased pressure on the artery going from the heart to the lungs.

## 2022-04-28 NOTE — Telephone Encounter (Signed)
I notified the patient's daughter (DPR) of her results. Her daughter wanted me to let you know she had a fall last week and had to go to the ED (04/24/2022). They did a CT Cervical Spine and on it they saw a right upper lobe lesion. She is wanting to know if her mother should be seen sooner then 06/09/2022 or if there is anything they should do?

## 2022-04-28 NOTE — Telephone Encounter (Signed)
ATC the patient's daughter. I did not get an answer and her voicemail box is full. I will try again later.

## 2022-04-28 NOTE — Telephone Encounter (Signed)
I have placed the order for the CT and notified the patient's daughter.  Nothing further needed.

## 2022-05-04 ENCOUNTER — Ambulatory Visit
Admission: RE | Admit: 2022-05-04 | Discharge: 2022-05-04 | Disposition: A | Discharge status: Home / Self Care | Payer: Medicare PPO | Source: Ambulatory Visit | Attending: Pulmonary Disease | Admitting: Pulmonary Disease

## 2022-05-04 DIAGNOSIS — J984 Other disorders of lung: Secondary | ICD-10-CM | POA: Diagnosis not present

## 2022-06-09 ENCOUNTER — Encounter: Payer: Self-pay | Admitting: Pulmonary Disease

## 2022-06-09 ENCOUNTER — Ambulatory Visit: Payer: Medicare PPO | Admitting: Pulmonary Disease

## 2022-06-09 VITALS — BP 148/72 | HR 64 | Temp 97.9°F | Ht 65.0 in | Wt 135.0 lb

## 2022-06-09 DIAGNOSIS — K449 Diaphragmatic hernia without obstruction or gangrene: Secondary | ICD-10-CM | POA: Diagnosis not present

## 2022-06-09 DIAGNOSIS — R0602 Shortness of breath: Secondary | ICD-10-CM | POA: Diagnosis not present

## 2022-06-09 DIAGNOSIS — J4540 Moderate persistent asthma, uncomplicated: Secondary | ICD-10-CM | POA: Diagnosis not present

## 2022-06-09 DIAGNOSIS — K219 Gastro-esophageal reflux disease without esophagitis: Secondary | ICD-10-CM

## 2022-06-09 DIAGNOSIS — E8801 Alpha-1-antitrypsin deficiency: Secondary | ICD-10-CM

## 2022-06-09 DIAGNOSIS — J479 Bronchiectasis, uncomplicated: Secondary | ICD-10-CM | POA: Diagnosis not present

## 2022-06-09 NOTE — Patient Instructions (Addendum)
Your lungs sounded good today.  The overnight test did not show that you needed any oxygen at nighttime.  We discussed that you need to stay upright or at least sitting up 2 to 3 hours after eating a meal.  Continue taking the acid reducer medication that Dr. Sullivan Lone prescribed.  Your heart is strong.  Continue taking Trelegy 1 puff daily.  We will see you in follow-up in 4 months time call sooner should any new problems arise.

## 2022-06-09 NOTE — Progress Notes (Signed)
Subjective:    Patient ID: Terri Braun, female    DOB: Jul 05, 1929, 89 y.o.   MRN: [id number] Patient Care Team: Debera Lat, Cordelia Poche as PCP - General (Physician Assistant) Antonieta Iba, MD as PCP - Cardiology (Cardiology)  Chief Complaint  Patient presents with   Follow-up    SOB off and on. No wheezing. Occasional cough with pale yellow sputum.    HPI Patient is a 89 year old lifelong never smoker who presents for follow-up of shortness of breath which "comes off and on".  I last saw the patient on 10 March 2022.  Recall that the patient was diagnosed with "COPD" 18 years ago.  She was also told she had alpha-1 antitrypsin deficiency but this apparently was based on a history of COPD and elevated liver function test.  This was in [facility].  In any event , she has carried this diagnosis and was evaluated by Dr. Sung Amabile who did not feel she had COPD nor alpha-1 however ,alpha-1 levels phenotype had not been checked.  At her initial visit with me she was noted to have elevated nitric oxide levels of 47 ppb and was given a trial of Trelegy Ellipta for suspected asthma.  We also requested alpha-1 studies.   She had PFTs on 25 January which unfortunately she could not complete due to inability to perform the maneuvers.  It appears that she has a very minimal diffusion defect but we could not get any data on FEV1 or FVC due to the patient's inability to perform the test.  Her diffusion capacity appeared improved from prior.  She has been back on Trelegy Ellipta which previously had worked for her well.  She notes that she feels better she has no wheezing.  She has noted cough at nighttime this is a chronic issue is usually dry and only when she is recumbent.  Most recent CT scan performed on 04 May 2022 shows that she has significant bronchiectasis cylindric type, particularly on the right.  This can have been done for concern of a cystic lesion in the right upper lobe.  It  turns out that this is part of cylindrical bronchiectasis.  This scan also shows that she has a significant hiatal hernia and her findings in the chest may be to chronic silent aspiration.  She has dyspnea on exertion but her daughter who is present with her notes that she is totally sedentary.  She believes most of the issues with her mother are due to deconditioning.  Overall her nocturnal cough also appears somewhat improved to her starting the Trelegy.  She uses albuterol 2-3 times per week.  Since her prior visit the daughter has not noted her mother to be severely dyspneic as she had noted previously   Discussed the findings of her most recent CT chest in detail with the patient and her daughter who accompanies her today.  She does not endorse any other symptomatology today.  DATA 03/20/2018 PFTs: FEV1 1.94 L or 104% predicted, FVC 2.47 L or 100% predicted, FEV1/FVC 76%,no bronchodilator response. 03/20/2018 chest x-ray: On my review there is hyperinflation, otherwise, no acute disease. 01/20/2022 alpha-1 phenotype: MZ, level 120 mg/dL 40/98/1191 echocardiogram: LVEF 60 to 65%, no regional wall motion abnormalities, RV function normal.  Left atrial size mildly dilated, no evidence of mitral regurgitation, mild aortic regurgitation.  No significant change from prior study performed 2022 05/04/2022 CT chest: Areas of bronchiectasis most significant on the right upper lobe and right middle lobe, diffuse  tree-in-bud process most significantly in the right upper lobe and right middle lobe likely reflecting atypical infection such as MAC, no worrisome pulmonary lesions, reactive borderline mediastinal hilar lymph nodes.  Cirrhotic changes involving the liver but no hepatic lesions.  There is also a significant hiatal hernia.  Review of Systems A 10 point review of systems was performed and it is as noted above otherwise negative.  Patient Active Problem List   Diagnosis Date Noted   Moderate  persistent asthma without complication 03/10/2022   Primary hypertension 02/17/2022   Olecranon bursitis of right elbow 08/23/2021   Leg hematoma, left, initial encounter 05/03/2021   Syncope 05/03/2021   Osteoarthritis of left knee 04/12/2021   Leukocytes in urine 12/13/2019   Urinary frequency 12/13/2019   Closed fracture of metatarsal bone 12/25/2018   Polyp of ascending colon 02/16/2018   Diverticula, colon 02/16/2018   Primary hypothyroidism 02/16/2018   Essential hypertension, benign 02/16/2018   ASCVD (arteriosclerotic cardiovascular disease) 02/16/2018   Periumbilical abdominal pain 02/16/2018   GI bleed 02/16/2018   Iron deficiency anemia, unspecified 02/16/2018   Chronic liver disease 02/16/2018   Carrier of alpha-1-antitrypsin deficiency 02/16/2018   Personal history of colonic polyps 02/16/2018   Abnormal liver CT 02/16/2018   Elevated liver enzymes 02/16/2018   History of hepatitis C 02/16/2018   Abnormal findings on imaging test 02/16/2018   Helicobacter pylori gastritis 02/16/2018   Gait instability 02/13/2018   Mixed hyperlipidemia 02/13/2018   Atherosclerosis of native coronary artery of native heart with stable angina pectoris (HCC) 02/12/2018   Centrilobular emphysema (HCC) 02/12/2018   Anemia 02/12/2018   Benign neoplasm of cecum 01/16/2018   Heterozygous alpha 1-antitrypsin deficiency (HCC) 01/16/2018   Gastroesophageal reflux disease 01/16/2018   Bronchiectasis without complication (HCC) 05/16/2016   Moderate episode of recurrent major depressive disorder (HCC) 04/14/2016   Bloody stool 09/15/2015   Left lower quadrant pain 09/15/2015   Long term (current) use of antithrombotics/antiplatelets 09/15/2015   Dyspnea 05/18/2015   Anxiety 11/14/2011   DJD (degenerative joint disease) 11/14/2011   Abnormal stress test 04/20/2010   Dyslipidemia 04/20/2010   Status post coronary artery stent placement 04/20/2010   CAD (coronary artery disease) 04/20/2010    Social History   Tobacco Use   Smoking status: Never   Smokeless tobacco: Never  Substance Use Topics   Alcohol use: Never   Allergies  Allergen Reactions   Shellfish Allergy Anaphylaxis    Ended up in the ED after eating shellfish, caused diarrhea and severe GI upset , unsure if caused SOB   Shellfish-Derived Products     Other reaction(s): Not available   Sulfa Antibiotics    Current Meds  Medication Sig   acetaminophen (TYLENOL) 500 MG tablet Take 500 mg by mouth every 4 (four) hours as needed.   albuterol (VENTOLIN HFA) 108 (90 Base) MCG/ACT inhaler TAKE 2 PUFFS BY MOUTH EVERY 6 HOURS AS NEEDED FOR WHEEZE OR SHORTNESS OF BREATH   alendronate (FOSAMAX) 70 MG tablet alendronate 70 mg tablet  TAKE 1 TABLET BY MOUTH ONCE WEEKLY   celecoxib (CELEBREX) 100 MG capsule TAKE 1 CAPSULE BY MOUTH EVERY DAY   clopidogrel (PLAVIX) 75 MG tablet TAKE 1 TABLET BY MOUTH EVERY DAY   conjugated estrogens (PREMARIN) vaginal cream Apply one dab to Urethra daily at bedtime   DULoxetine (CYMBALTA) 60 MG capsule Take 60 mg by mouth daily.   Fluticasone-Umeclidin-Vilant (TRELEGY ELLIPTA) 100-62.5-25 MCG/ACT AEPB Inhale 1 puff into the lungs daily.   nitroGLYCERIN (NITROSTAT)  0.4 MG SL tablet Place 1 tablet (0.4 mg total) under the tongue every 5 (five) minutes as needed for chest pain.   omeprazole (PRILOSEC) 20 MG capsule Take 20 mg by mouth daily.   Immunization History  Administered Date(s) Administered   Fluad Quad(high Dose 65+) 10/24/2018   Influenza Split 11/01/2013   Influenza, High Dose Seasonal PF 10/28/2016, 11/03/2017   Influenza,inj,Quad PF,6+ Mos 12/05/2014   Influenza-Unspecified 10/14/2015, 10/28/2016, 10/31/2016, 10/01/2021   Pneumococcal Conjugate-13 07/14/2014   Pneumococcal Polysaccharide-23 07/29/2015      Objective:   Physical Exam BP (!) 148/72 (BP Location: Left Arm, Cuff Size: Normal)   Pulse 64   Temp 97.9 F (36.6 C)   Ht 5\' 5"  (1.651 m)   Wt 135 lb (61.2 kg)  Comment: per patient. in a wheelchair today  SpO2 97%   BMI 22.47 kg/m   SpO2: 97 % O2 Device: None (Room air)  GENERAL: Elderly woman, presents in transport chair, no conversational dyspnea, awake, alert and oriented. HEAD: Normocephalic, atraumatic.  EYES: Pupils equal, round, reactive to light.  No scleral icterus.  MOUTH: Dentition intact, oral mucosa moist NECK: Supple. No thyromegaly. Trachea midline. No JVD.  No adenopathy. PULMONARY: Good air entry bilaterally.  No adventitious sounds. CARDIOVASCULAR: S1 and S2. Regular rate and rhythm.  No rubs, murmurs or gallops heard. ABDOMEN: Benign.   MUSCULOSKELETAL: No joint deformity, no clubbing, no edema.  NEUROLOGIC: Gait not tested, no overt focal deficit. SKIN: Intact,warm,dry. PSYCH: Mood and behavior normal.   Representative images from the CT performed 03 April 2022 showing significant right upper lobe bronchiectasis, cystic cavitary lesion on the lobe appears to be part of cystic bronchiectasis and not a true cavitary lesion.  Is also extensive surrounding tree-in-bud nodularity.  Peribronchial thickening and airspace nodularity in the right upper lobe.  Findings consistent with chronic inflammation:      Assessment & Plan:     ICD-10-CM   1. Moderate persistent asthma without complication  J45.40    Continue Trelegy 100, 1 inhalation daily Continue as needed albuterol    2. Shortness of breath  R06.02    Overall compensated on current regimen    3. Bronchiectasis without complication (HCC)  J47.9    Asymptomatic at present Will continue to monitor    4. Hiatal hernia with gastroesophageal reflux  K44.9    K21.9    This issue adds complexity to her management Continue PPI Consider discontinuation of Celebrex/Fosamax May add to issues with cough/shortness of breath    5. Heterozygous alpha 1-antitrypsin deficiency (HCC)  E88.01    Type MZ Level 120 mg/dL     Will see the patient in follow-up in 4 months time  call sooner should any new difficulties arise.  Terri Shelter, MD Advanced Bronchoscopy PCCM [facility]    *This note was dictated using voice recognition software/Dragon.  Despite best efforts to proofread, errors can occur which can change the meaning. Any transcriptional errors that result from this process are unintentional and may not be fully corrected at the time of dictation.

## 2022-06-15 ENCOUNTER — Encounter: Payer: Self-pay | Admitting: Pulmonary Disease

## 2022-06-25 ENCOUNTER — Other Ambulatory Visit: Payer: Self-pay | Admitting: Cardiovascular Disease

## 2022-09-21 ENCOUNTER — Telehealth: Payer: Self-pay | Admitting: Cardiovascular Disease

## 2022-09-21 ENCOUNTER — Other Ambulatory Visit: Payer: Self-pay

## 2022-09-21 MED ORDER — ROSUVASTATIN CALCIUM 10 MG PO TABS: 10.0000 mg | ORAL_TABLET | Freq: Every day | ORAL | 0 refills | Status: DC

## 2022-09-21 NOTE — Telephone Encounter (Signed)
*  STAT* If patient is at the pharmacy, call can be transferred to refill team.   1. Which medications need to be refilled? (please list name of each medication and dose if known)   rosuvastatin (CRESTOR) 10 MG tablet    2. Which pharmacy/location (including street and city if local pharmacy) is medication to be sent to? [id number] - [place], [place] - [address]    3. Do they need a 30 day or 90 day supply? 90 day

## 2022-09-21 NOTE — Telephone Encounter (Signed)
rosuvastatin (CRESTOR) 10 MG tablet 90 tablet 0 09/21/2022 --   Sig - Route: Take 1 tablet (10 mg total) by mouth daily. - Oral   Sent to pharmacy as: rosuvastatin (CRESTOR) 10 MG tablet   E-Prescribing Status: Receipt confirmed by pharmacy (09/21/2022  3:23 PM EDT)    Pharmacy  [id number] [place], [place] - [address]

## 2022-10-19 ENCOUNTER — Other Ambulatory Visit: Payer: Self-pay | Admitting: Physician Assistant

## 2022-11-11 ENCOUNTER — Ambulatory Visit: Payer: Medicare PPO | Attending: Medical | Admitting: Medical

## 2022-11-11 ENCOUNTER — Encounter: Payer: Self-pay | Admitting: Medical

## 2022-11-11 VITALS — BP 130/62 | HR 72 | Ht 65.0 in | Wt 140.0 lb

## 2022-11-11 DIAGNOSIS — I25118 Atherosclerotic heart disease of native coronary artery with other forms of angina pectoris: Secondary | ICD-10-CM | POA: Diagnosis not present

## 2022-11-11 DIAGNOSIS — Z79899 Other long term (current) drug therapy: Secondary | ICD-10-CM

## 2022-11-11 DIAGNOSIS — R2681 Unsteadiness on feet: Secondary | ICD-10-CM

## 2022-11-11 DIAGNOSIS — R0789 Other chest pain: Secondary | ICD-10-CM | POA: Diagnosis not present

## 2022-11-11 DIAGNOSIS — I10 Essential (primary) hypertension: Secondary | ICD-10-CM

## 2022-11-11 DIAGNOSIS — E782 Mixed hyperlipidemia: Secondary | ICD-10-CM

## 2022-11-11 MED ORDER — NITROGLYCERIN 0.4 MG SL SUBL: 0.4000 mg | SUBLINGUAL_TABLET | SUBLINGUAL | 3 refills | Status: DC | PRN

## 2022-11-11 NOTE — Patient Instructions (Signed)
Medication Instructions:  Your physician recommends that you continue on your current medications as directed. Please refer to the Current Medication list given to you today.   *If you need a refill on your cardiac medications before your next appointment, please call your pharmacy*   Lab Work: Your provider would like for you to have following labs drawn today (CBC, CMP, TSH, Lipid, Direct LDL).     Testing/Procedures: No test ordered today    Follow-Up: At [facility], you and your health needs are our priority.  As part of our continuing mission to provide you with exceptional heart care, we have created designated Provider Care Teams.  These Care Teams include your primary Cardiologist (physician) and Advanced Practice Providers (APPs -  Physician Assistants and Nurse Practitioners) who all work together to provide you with the care you need, when you need it.  We recommend signing up for the patient portal called "MyChart".  Sign up information is provided on this After Visit Summary.  MyChart is used to connect with patients for Virtual Visits (Telemedicine).  Patients are able to view lab/test results, encounter notes, upcoming appointments, etc.  Non-urgent messages can be sent to your provider as well.   To learn more about what you can do with MyChart, go to [email or web address].    Your next appointment:   1 year(s)  Provider:   You may see Julien Nordmann, MD Eula Listen, PA-C Cadence Fransico Michael, PA-C Charlsie Quest, NP

## 2022-11-11 NOTE — Progress Notes (Signed)
Cardiology Office Note:    Date:  11/14/2022   ID:  Terri Braun, DOB 14-Oct-1929, MRN [id number]  PCP:  Bosie Clos, MD  [facility] Cardiologist:  Julien Nordmann, MD  [facility] Electrophysiologist:  None   Referring MD: Bosie Clos, MD   Chief Complaint: 1 year follow-up  History of Present Illness:    Terri Braun is a 89 y.o. female with a hx of CAD s/p PCI/DES to the RCA in 2012, COPD with chronic SOB, HTN, HLD, anemia, hypothyroidism, gait instability with falls, OA, and GERD who presents for 1 year follow-up.    She was previously followed by cardiology in [place], [place].  She underwent LHC in 04/2010 with PCI/DES with a Xience drug-eluting stent to the ostial RCA.  Repeat cath in 2015 demonstrated a patent stent with 50% ostial narrowing followed by 30% ISR as well as a 20 to 30% LAD disease, 20 to 30% ostial LCx disease, and a small PDA with 40% stenosis.  Echo in 03/2015 showed normal LV systolic function with mild mitral regurgitation and aortic insufficiency.  She was last seen in the office in 04/2019 and was doing well from a cardiac perspective though was noted to have broken 3 bones in her right foot which limited her ambulation.   She was seen in the [facility] ED in 08/2019 with possible syncope versus fall.  Troponin was checked x1 and found to be normal.  EKG showed sinus rhythm with no acute ischemic changes.  CT head showed no evidence of acute intracranial abnormality with noted small chronic infarcts within the cerebellar hemispheres bilaterally and chronic small vessel ischemic disease.  UA consistent with UTI.  Hemoglobin was low though stable.  Symptoms were felt to be related to vasovagal near syncope and she was treated for UTI.   Myoview Lexiscan 05/2020 showed no significant ischemia, normal LVEF, CT attenuation imaging with mild diffuse aortic atherosclerosis, mild 3V CAD, overall low risk scan. Echo showed LVEF 60-65%, no WMA, mild LVH,  G1DD, mild AI.  Patient was last seen 06/19/2021 reporting left-sided chest pain underneath her armpit, overall atypical.  No further ischemic workup was pursued at that time.  Today, the patient reports she is overall doing well. She reports some falls, last one was 2 months ago. She uses a walker. She feels they are from leg weakness. She did PT this year. She has not had any significant injuries from the falls. She reports very rare chest pain and DOE. Symptoms are not worse with exertion. No lower leg edema. She has occasional lightheadedness and dizziness, worse with position changes. BP at home is normally good at home. She is eating and drinking normally.     Past Medical History:  Diagnosis Date   Anemia    ASCVD (arteriosclerotic cardiovascular disease)    COPD (chronic obstructive pulmonary disease) (HCC)    Diverticulosis    GERD (gastroesophageal reflux disease)    Hypertension    Hypothyroidism     Past Surgical History:  Procedure Laterality Date   APPENDECTOMY     BREAST SURGERY  1983   Lumpectomy   CHOLECYSTECTOMY     COLON SURGERY     COLONOSCOPY  11/04/2015   CORONARY ANGIOPLASTY WITH STENT PLACEMENT     NISSEN FUNDOPLICATION     TOTAL ABDOMINAL HYSTERECTOMY     With BSO    Current Medications: Current Meds  Medication Sig   albuterol (VENTOLIN HFA) 108 (90 Base) MCG/ACT inhaler TAKE 2  PUFFS BY MOUTH EVERY 6 HOURS AS NEEDED FOR WHEEZE OR SHORTNESS OF BREATH   alendronate (FOSAMAX) 70 MG tablet alendronate 70 mg tablet  TAKE 1 TABLET BY MOUTH ONCE WEEKLY   celecoxib (CELEBREX) 100 MG capsule TAKE 1 CAPSULE BY MOUTH EVERY DAY   clopidogrel (PLAVIX) 75 MG tablet TAKE 1 TABLET BY MOUTH EVERY DAY   diphenhydramine-acetaminophen (TYLENOL PM) 25-500 MG TABS tablet Take 1 tablet by mouth at bedtime as needed.   DULoxetine (CYMBALTA) 60 MG capsule Take 60 mg by mouth daily.   Fluticasone-Umeclidin-Vilant (TRELEGY ELLIPTA) 100-62.5-25 MCG/ACT AEPB Inhale 1 puff into  the lungs daily.   isosorbide mononitrate (IMDUR) 30 MG 24 hr tablet Take 1 tablet (30 mg total) by mouth daily.   losartan (COZAAR) 50 MG tablet Take 1 tablet by mouth daily.   rosuvastatin (CRESTOR) 10 MG tablet Take 1 tablet (10 mg total) by mouth daily.   [DISCONTINUED] nitroGLYCERIN (NITROSTAT) 0.4 MG SL tablet Place 1 tablet (0.4 mg total) under the tongue every 5 (five) minutes as needed for chest pain.     Allergies:   Shellfish allergy, Shellfish-derived products, and Sulfa antibiotics   Social History   Socioeconomic History   Marital status: Widowed    Spouse name: Not on file   Number of children: Not on file   Years of education: Not on file   Highest education level: Not on file  Occupational History   Not on file  Tobacco Use   Smoking status: Never   Smokeless tobacco: Never  Vaping Use   Vaping status: Never Used  Substance and Sexual Activity   Alcohol use: Never   Drug use: Never   Sexual activity: Not on file  Other Topics Concern   Not on file  Social History Narrative   She is widowed she has sons and daughters, she moved here from [place] to live in [place] near her daughter   2 caffeinated beverages a day   Rare alcohol   Never smoker   Social Determinants of Corporate investment banker Strain: Low Risk  (09/29/2021)   Overall Financial Resource Strain (CARDIA)    Difficulty of Paying Living Expenses: Not very hard  Food Insecurity: No Food Insecurity (09/29/2021)   Hunger Vital Sign    Worried About Running Out of Food in the Last Year: Never true    Ran Out of Food in the Last Year: Never true  Transportation Needs: No Transportation Needs (09/29/2021)   PRAPARE - Administrator, Civil Service (Medical): No    Lack of Transportation (Non-Medical): No  Physical Activity: Insufficiently Active (09/29/2021)   Exercise Vital Sign    Days of Exercise per Week: 2 days    Minutes of Exercise per Session: 60 min   Stress: No Stress Concern Present (09/29/2021)   [employer] of Occupational Health - Occupational Stress Questionnaire    Feeling of Stress : Only a little  Social Connections: Moderately Isolated (09/29/2021)   Social Connection and Isolation Panel [NHANES]    Frequency of Communication with Friends and Family: More than three times a week    Frequency of Social Gatherings with Friends and Family: More than three times a week    Attends Religious Services: More than 4 times per year    Active Member of [employer] or Organizations: No    Attends Banker Meetings: Never    Marital Status: Widowed     Family History: The patient's family  history includes Cancer in an other family member; Coronary artery disease in her mother; Crohn's disease in her son; Heart disease in her mother.  ROS:   Please see the history of present illness.     All other systems reviewed and are negative.  EKGs/Labs/Other Studies Reviewed:    The following studies were reviewed today:  Echo 04/2022 1. Left ventricular ejection fraction, by estimation, is 60 to 65%. Left  ventricular ejection fraction by 3D volume is 67 %. The left ventricle has  normal function. The left ventricle has no regional wall motion  abnormalities. Left ventricular diastolic   function could not be evaluated. The average left ventricular global  longitudinal strain is -17.6 %. The global longitudinal strain is normal.   2. Right ventricular systolic function is normal. The right ventricular  size is normal.   3. Left atrial size was mildly dilated.   4. The mitral valve is normal in structure. No evidence of mitral valve  regurgitation.   5. The aortic valve is tricuspid. Aortic valve regurgitation is mild.   Comparison(s): 06/10/20 600-65%, LAE, Mild AI.   Myoview lexiscan 05/2020 Narrative & Impression  Pharmacological myocardial perfusion imaging study with no significant  ischemia Normal wall motion, EF  estimated at 93% No EKG changes concerning for ischemia at peak stress or in recovery. CT attenuation correction images with mild diffuse aortic atherosclerosis, Mild three-vessel coronary calcification  Low risk scan    EKG:  EKG is  ordered today.  The ekg ordered today demonstrates NSR, 72bpm, nonspecific t wave changes  Recent Labs: 11/11/2022: ALT 12; BUN 22; Creatinine, Ser 0.90; Hemoglobin 8.7; Platelets 235; Potassium 4.7; Sodium 139; TSH 2.440  Recent Lipid Panel    Component Value Date/Time   CHOL 147 11/11/2022 1447   TRIG 73 11/11/2022 1447   HDL 84 11/11/2022 1447   CHOLHDL 1.8 11/11/2022 1447   LDLCALC 49 11/11/2022 1447   LDLDIRECT 46 11/11/2022 1447    Physical Exam:    VS:  BP 130/62 (BP Location: Left Arm, Patient Position: Sitting, Cuff Size: Normal)   Pulse 72   Ht 5\' 5"  (1.651 m)   Wt 140 lb (63.5 kg)   SpO2 98%   BMI 23.30 kg/m     Wt Readings from Last 3 Encounters:  11/11/22 140 lb (63.5 kg)  06/09/22 135 lb (61.2 kg)  04/24/22 132 lb 4.4 oz (60 kg)     GEN:  Well nourished, well developed in no acute distress HEENT: Normal NECK: No JVD; No carotid bruits LYMPHATICS: No lymphadenopathy CARDIAC: RRR, no murmurs, rubs, gallops RESPIRATORY:  Clear to auscultation without rales, wheezing or rhonchi  ABDOMEN: Soft, non-tender, non-distended MUSCULOSKELETAL:  No edema; No deformity  SKIN: Warm and dry NEUROLOGIC:  Alert and oriented x 3 PSYCHIATRIC:  Normal affect   ASSESSMENT:    1. Atypical chest pain   2. Coronary artery disease involving native coronary artery of native heart with other form of angina pectoris (HCC)   3. Gait instability   4. Primary hypertension   5. Medication management   6. Hyperlipidemia, mixed    PLAN:    In order of problems listed above:  Atypical chest pain Coronary artery calcification Patient reports rare atypical chest pain. She has a long history of left sided chest pain that is worse with laying down.  Myoview lexiscan in 2022 showed no ischemia, overall low risk. Patient is not very functional at baseline. No further ischemic work-up at this time.  Continue Plavix, Imdur, Crestor and Losartan. I will check CBC, CMET, lipids and TSH.   Gait instability The patient uses a walker. She reports multiple mechanical falls. She has done PT this year.   HTN BP is good today. She reports occasional orthostatic symptoms. Continue Losartan and Imdur. I will check a CMET.   HLD Update lipids today. Continue Crestor 10mg  daily.   Disposition: Follow up in 1 year(s) with MD    Signed, Alvetta Hidrogo David Stall, PA-C  11/14/2022 3:06 PM    [facility]

## 2022-11-12 LAB — COMPREHENSIVE METABOLIC PANEL
ALT: 12 [IU]/L (ref 0–32)
AST: 21 [IU]/L (ref 0–40)
Albumin: 3.6 g/dL (ref 3.6–4.6)
Alkaline Phosphatase: 132 [IU]/L — ABNORMAL HIGH (ref 44–121)
BUN/Creatinine Ratio: 24 (ref 12–28)
BUN: 22 mg/dL (ref 10–36)
Bilirubin Total: 0.2 mg/dL (ref 0.0–1.2)
CO2: 26 mmol/L (ref 20–29)
Calcium: 8.6 mg/dL — ABNORMAL LOW (ref 8.7–10.3)
Chloride: 101 mmol/L (ref 96–106)
Creatinine, Ser: 0.9 mg/dL (ref 0.57–1.00)
Globulin, Total: 2.8 g/dL (ref 1.5–4.5)
Glucose: 86 mg/dL (ref 70–99)
Potassium: 4.7 mmol/L (ref 3.5–5.2)
Sodium: 139 mmol/L (ref 134–144)
Total Protein: 6.4 g/dL (ref 6.0–8.5)
eGFR: 60 mL/min/{1.73_m2} (ref >59)

## 2022-11-12 LAB — CBC
Hematocrit: 27.5 % — ABNORMAL LOW (ref 34.0–46.6)
Hemoglobin: 8.7 g/dL — ABNORMAL LOW (ref 11.1–15.9)
MCH: 29.9 pg (ref 26.6–33.0)
MCHC: 31.6 g/dL (ref 31.5–35.7)
MCV: 95 fL (ref 79–97)
Platelets: 235 10*3/uL (ref 150–450)
RBC: 2.91 x10E6/uL — ABNORMAL LOW (ref 3.77–5.28)
RDW: 14.5 % (ref 11.7–15.4)
WBC: 4.7 10*3/uL (ref 3.4–10.8)

## 2022-11-12 LAB — TSH: TSH: 2.44 u[IU]/mL (ref 0.450–4.500)

## 2022-11-12 LAB — LIPID PANEL
Chol/HDL Ratio: 1.8 {ratio} (ref 0.0–4.4)
Cholesterol, Total: 147 mg/dL (ref 100–199)
HDL: 84 mg/dL (ref >39)
LDL Chol Calc (NIH): 49 mg/dL (ref 0–99)
Triglycerides: 73 mg/dL (ref 0–149)
VLDL Cholesterol Cal: 14 mg/dL (ref 5–40)

## 2022-11-12 LAB — LDL CHOLESTEROL, DIRECT: LDL Direct: 46 mg/dL (ref 0–99)

## 2022-12-17 ENCOUNTER — Other Ambulatory Visit: Payer: Self-pay | Admitting: Cardiovascular Disease

## 2022-12-20 ENCOUNTER — Ambulatory Visit: Payer: Medicare PPO | Admitting: Pulmonary Disease

## 2023-01-05 IMAGING — US US EXTREM LOW VENOUS*L*
1 series · 15 of 24 positions shown · non-contrast
Comparison: 03/10/2018

CLINICAL DATA: 89-year-old female with left lower extremity
swelling.

EXAM:
LEFT LOWER EXTREMITY VENOUS DOPPLER ULTRASOUND
TECHNIQUE: Gray-scale sonography with graded compression, as well as color
Doppler and duplex ultrasound were performed to evaluate the left
lower extremity deep venous systems from the level of the common
femoral vein and including the common femoral, femoral, profunda
femoral, popliteal and calf veins including the posterior tibial,
peroneal and gastrocnemius veins when visible. Spectral Doppler was
utilized to evaluate flow at rest and with distal augmentation
maneuvers in the common femoral, femoral and popliteal veins. The
contralateral common femoral vein was also evaluated for comparison.

[Series 1: us venous imag bi/left/(id) · portal-venous · 15 of 32 slices shown]
[im 1/32]
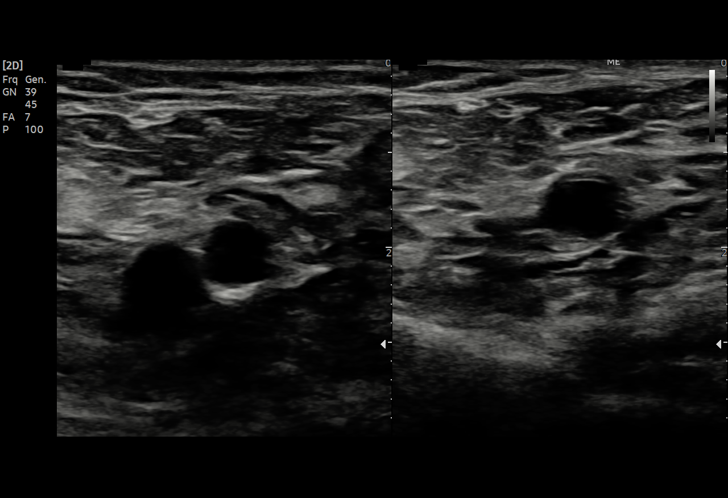
[im 3/32]
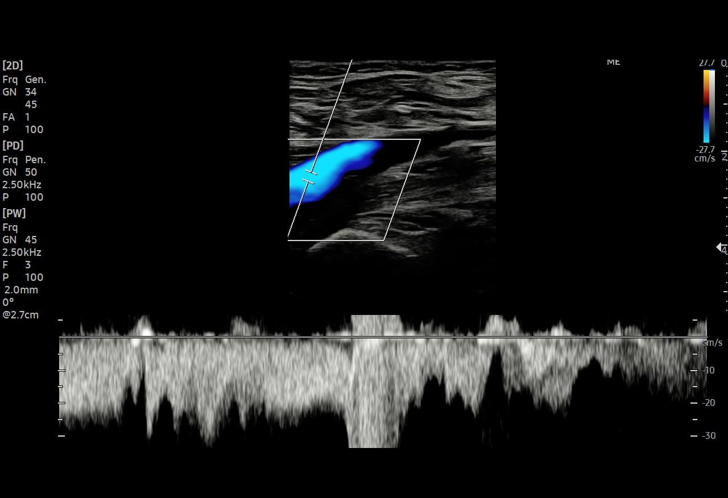
[im 6/32]
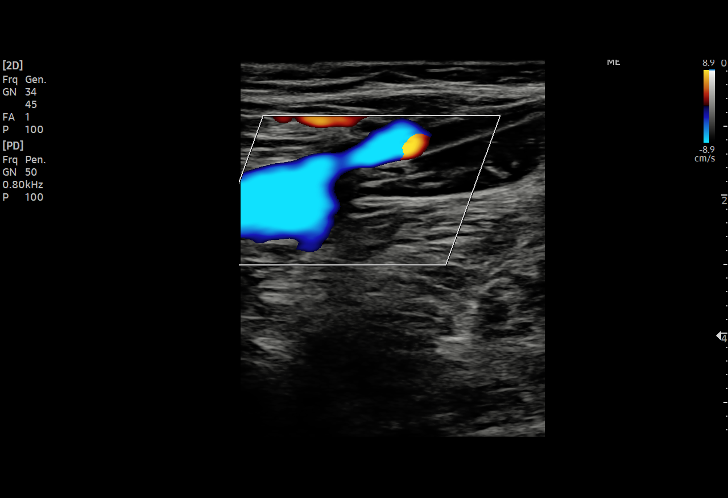
[im 7/32]
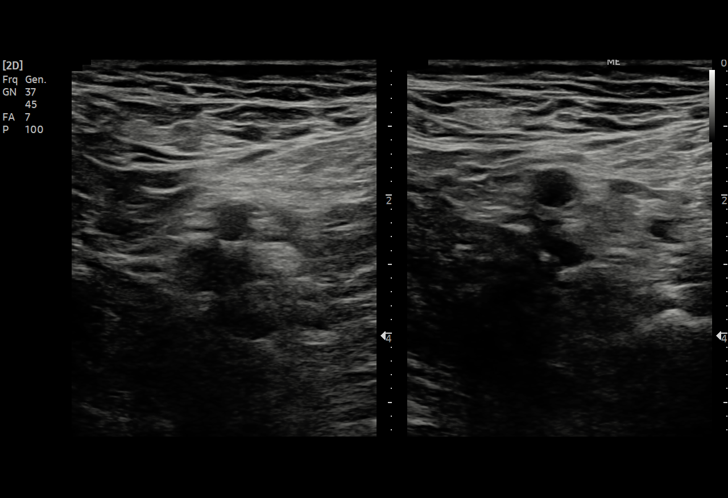
[im 10/32]
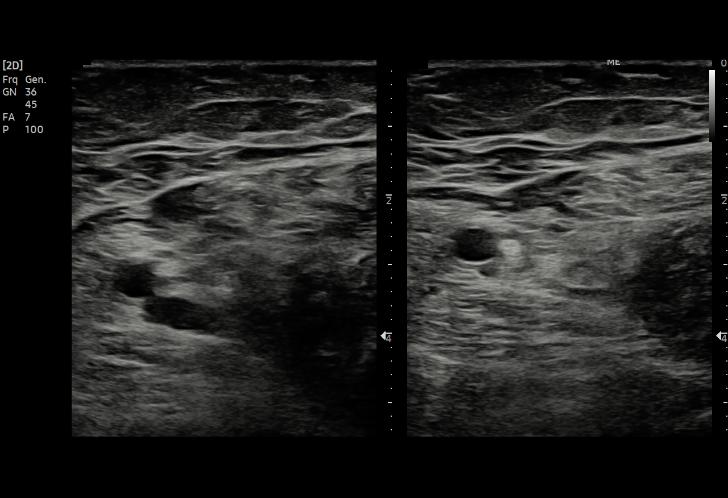
[im 11/32]
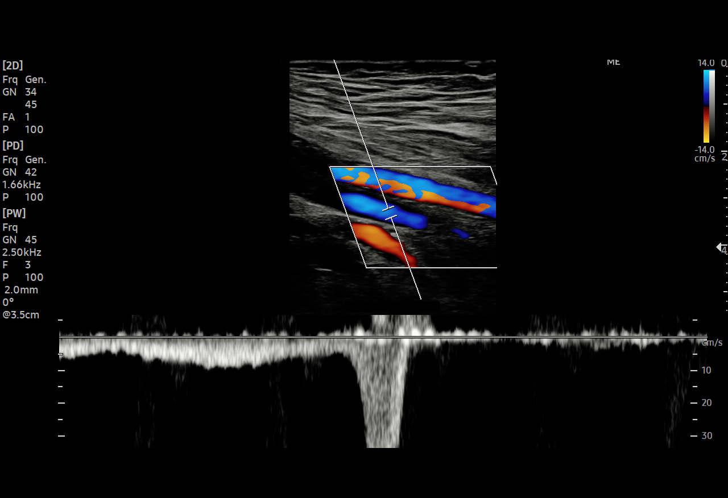
[im 14/32]
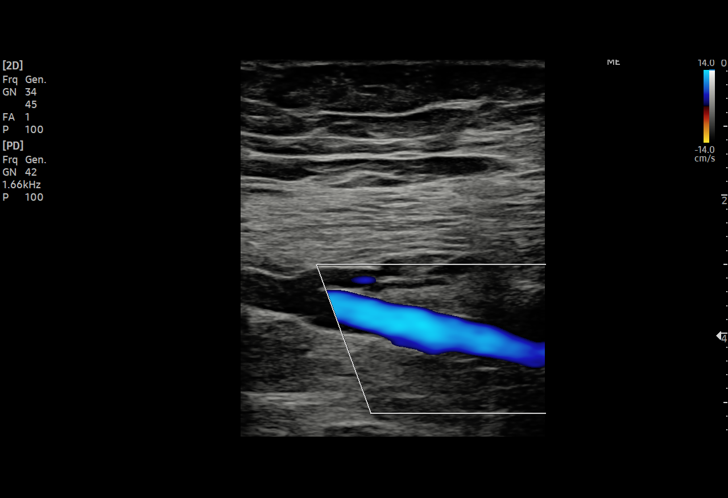
[im 17/32]
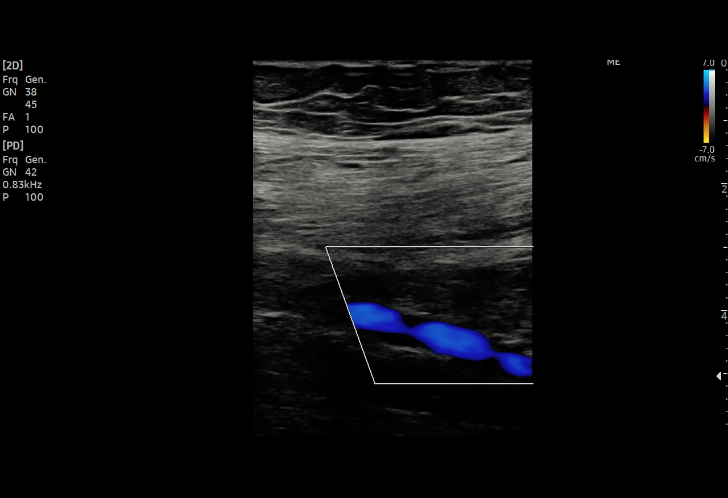
[im 18/32]
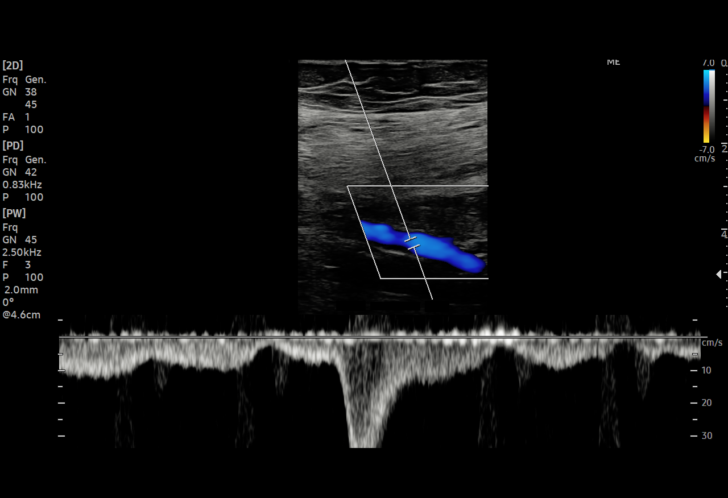
[im 21/32]
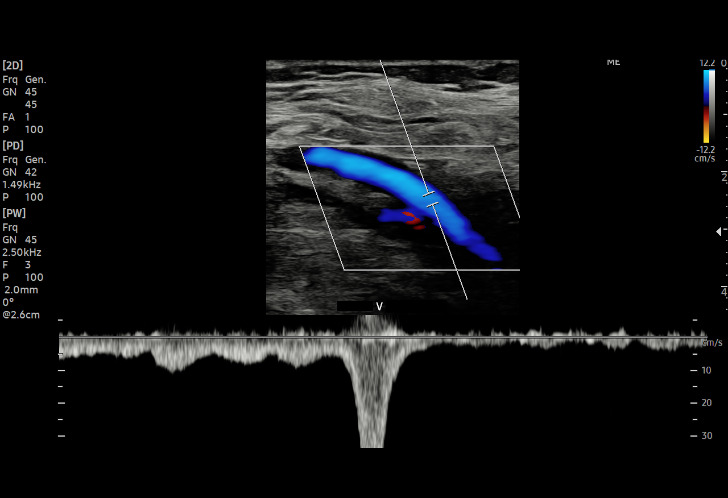
[im 22/32]
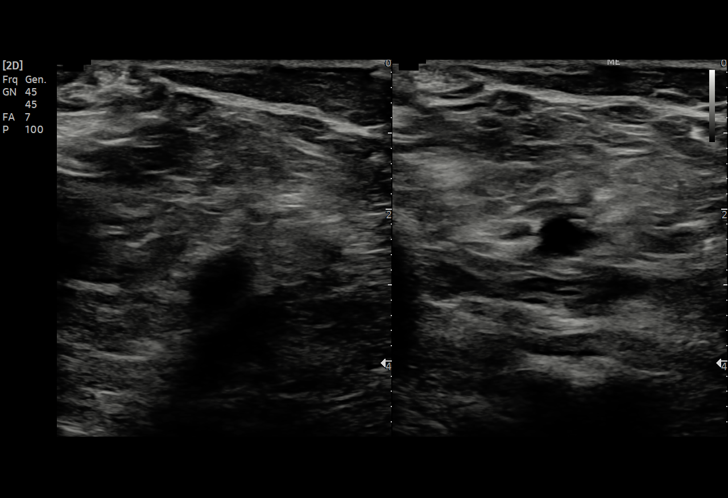
[im 25/32]
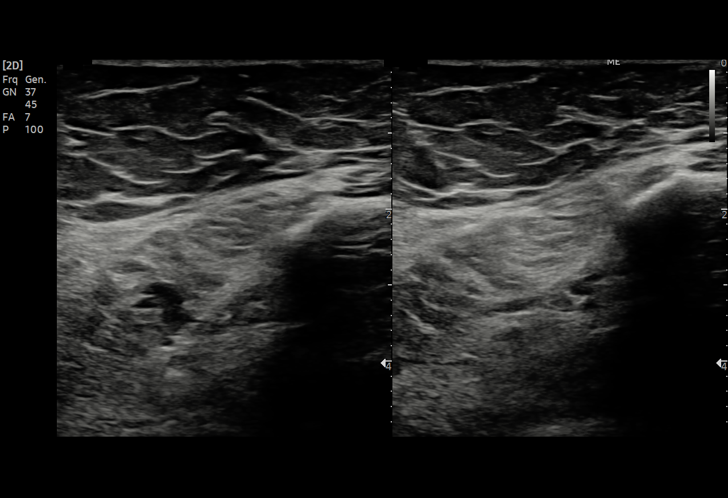
[im 27/32]
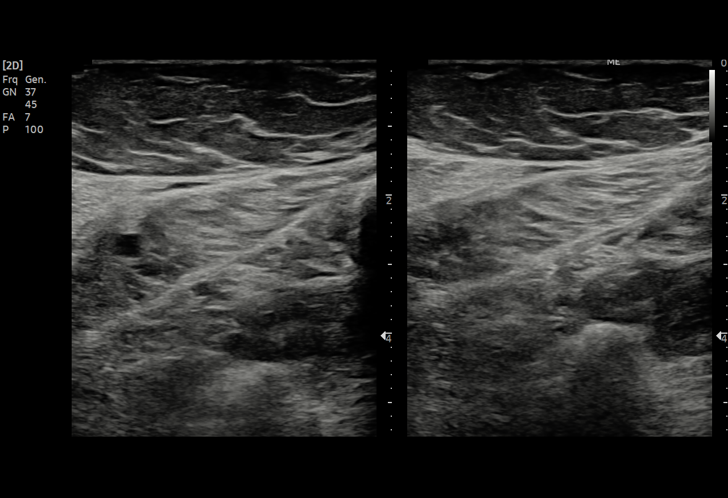
[im 29/32]
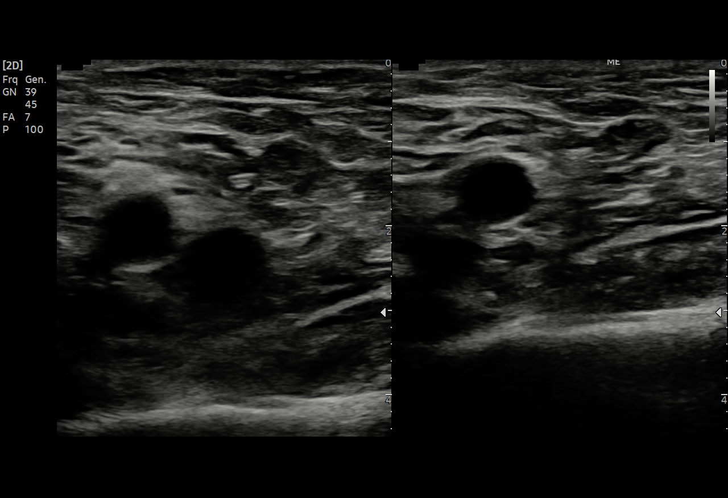
[im 32/32]
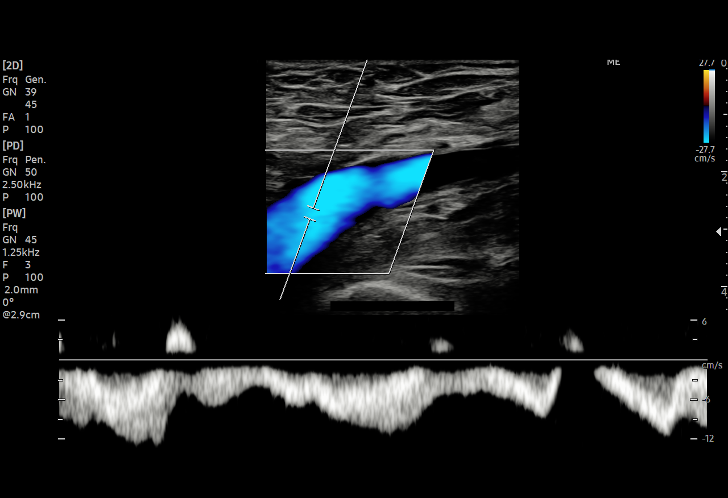

[15 of 24 positions shown; findings below may reference images not displayed]

FINDINGS: LEFT LOWER EXTREMITY

Common Femoral Vein: No evidence of thrombus. Normal
compressibility, respiratory phasicity and response to augmentation.

Central Greater Saphenous Vein: No evidence of thrombus. Normal
compressibility and flow on color Doppler imaging.

Central Profunda Femoral Vein: No evidence of thrombus. Normal
compressibility and flow on color Doppler imaging.

Femoral Vein: No evidence of thrombus. Normal compressibility,
respiratory phasicity and response to augmentation.

Popliteal Vein: No evidence of thrombus. Normal compressibility,
respiratory phasicity and response to augmentation.

Calf Veins: No evidence of thrombus. Normal compressibility and flow
on color Doppler imaging.

Other Findings:  None.

RIGHT LOWER EXTREMITY

Common Femoral Vein: No evidence of thrombus. Normal
compressibility, respiratory phasicity and response to augmentation.
IMPRESSION: No evidence of left lower extremity deep venous thrombosis.

## 2023-01-30 ENCOUNTER — Other Ambulatory Visit: Payer: Self-pay | Admitting: Pulmonary Disease

## 2023-02-09 ENCOUNTER — Ambulatory Visit: Payer: Medicare PPO | Admitting: Pulmonary Disease

## 2023-02-09 ENCOUNTER — Encounter: Payer: Self-pay | Admitting: Pulmonary Disease

## 2023-02-09 VITALS — BP 122/70 | HR 100 | Temp 97.3°F | Ht 65.0 in | Wt 140.0 lb

## 2023-02-09 DIAGNOSIS — R0602 Shortness of breath: Secondary | ICD-10-CM

## 2023-02-09 DIAGNOSIS — J4540 Moderate persistent asthma, uncomplicated: Secondary | ICD-10-CM

## 2023-02-09 DIAGNOSIS — E8801 Alpha-1-antitrypsin deficiency: Secondary | ICD-10-CM

## 2023-02-09 DIAGNOSIS — J479 Bronchiectasis, uncomplicated: Secondary | ICD-10-CM

## 2023-02-09 DIAGNOSIS — K219 Gastro-esophageal reflux disease without esophagitis: Secondary | ICD-10-CM

## 2023-02-09 DIAGNOSIS — K449 Diaphragmatic hernia without obstruction or gangrene: Secondary | ICD-10-CM | POA: Diagnosis not present

## 2023-02-09 NOTE — Patient Instructions (Signed)
 VISIT SUMMARY:  Today, we discussed your ongoing health issues, including asthma, bronchiectasis, hiatal hernia with reflux, and alpha-1 antitrypsin deficiency. We also addressed your long-standing hoarseness and arthritis in your legs. Your breathing appears stable, and there are no new or worsening symptoms reported.  YOUR PLAN:  -ASTHMA: You have moderate persistent asthma, and while Trelegy helps with your breathing, it may be contributing to your hoarseness. This hoarseness was present even before using Trelegy, likely due to previous inhalers. To help reduce the hoarseness, rinse your mouth with water containing a small amount of baking soda after using the inhaler. Continue using Trelegy as prescribed.  -BRONCHIECTASIS: Bronchiectasis is a chronic condition where the airways in your lungs are abnormally widened, leading to a build-up of mucus. There are no signs of an acute exacerbation, and your lungs sound clear. Continue with your current management plan.  -HIATAL HERNIA WITH REFLUX: A hiatal hernia with reflux occurs when part of your stomach pushes up through your diaphragm, causing acid reflux. There are no new symptoms or issues, so continue with your current management plan.  -ALPHA-1 ANTITRYPSIN DEFICIENCY (PHENOTYPE MZ): Alpha-1 antitrypsin deficiency is a genetic condition that can affect your lungs and liver. You have the MZ phenotype, which is a milder form. There are no new symptoms or issues, so continue with your current management plan.  -GENERAL HEALTH MAINTENANCE: You are up-to-date on your vaccinations, including the flu vaccine. Continue with routine vaccinations as per the schedule.  INSTRUCTIONS:  Please schedule a follow-up appointment in six months.

## 2023-02-09 NOTE — Progress Notes (Signed)
 Subjective:    Patient ID: Terri Braun, female    DOB: 10-May-1929, 89 y.o.   MRN: [id number]  Patient Care Team: Bertrum Charlie CROME, MD as PCP - General (Family Medicine) Perla Evalene PARAS, MD as PCP - Cardiology (Cardiology)  Chief Complaint  Patient presents with   Follow-up    SOB. No wheezing. Cough with clear/brownish sputum.    BACKGROUND/INTERVAL:Patient is a 89 year old lifelong never smoker who presents for follow-up of shortness of breath which comes off and on.  I last saw the patient on 09 Jun 2022.  She has moderate persistent asthma, bronchiectasis hiatal hernia with reflux and alpha-1 phenotype MZ with normal alpha-1 level.  HPI Discussed the use of AI scribe software for clinical note transcription with the patient, who gave verbal consent to proceed.  History of Present Illness   The patient, a 89 year old with a history of moderate persistent asthma, bronchiectasis, hiatal hernia with reflux, and heterogeneous alpha 1 antitrypsin deficiency, phenotype MZ, presents with a long-standing issue of hoarseness. The hoarseness predates the use of the current inhaler, Trelegy, and has been persistent even with previous inhaler use. The patient confirms adherence to mouth rinsing post-inhalation, which is suggested to mitigate the hoarseness.  The patient also reports arthritis in both legs but denies any noticeable swelling. No other new or worsening symptoms are reported. The patient's breathing appears to be stable, with no mention of exacerbations or increased inhaler use.   Patient's daughter who accompanies her today feels that her respiratory issues are well-controlled.     DATA 03/20/2018 PFTs: FEV1 1.94 L or 104% predicted, FVC 2.47 L or 100% predicted, FEV1/FVC 76%,no bronchodilator response. 03/20/2018 chest x-ray: On my review there is hyperinflation, otherwise, no acute disease. 01/20/2022 alpha-1 phenotype: MZ, level 120 mg/dL 96/72/7975 echocardiogram:  LVEF 60 to 65%, no regional wall motion abnormalities, RV function normal.  Left atrial size mildly dilated, no evidence of mitral regurgitation, mild aortic regurgitation.  No significant change from prior study performed 2022 05/04/2022 CT chest: Areas of bronchiectasis most significant on the right upper lobe and right middle lobe, diffuse tree-in-bud process most significantly in the right upper lobe and right middle lobe likely reflecting atypical infection such as MAC, no worrisome pulmonary lesions, reactive borderline mediastinal hilar lymph nodes.  Cirrhotic changes involving the liver but no hepatic lesions.  There is also a significant hiatal hernia.  Review of Systems A 10 point review of systems was performed and it is as noted above otherwise negative.   Patient Active Problem List   Diagnosis Date Noted   Moderate persistent asthma without complication 03/10/2022   Primary hypertension 02/17/2022   Olecranon bursitis of right elbow 08/23/2021   Leg hematoma, left, initial encounter 05/03/2021   Syncope 05/03/2021   Osteoarthritis of left knee 04/12/2021   Leukocytes in urine 12/13/2019   Urinary frequency 12/13/2019   Closed fracture of metatarsal bone 12/25/2018   Polyp of ascending colon 02/16/2018   Diverticula, colon 02/16/2018   Primary hypothyroidism 02/16/2018   Essential hypertension, benign 02/16/2018   ASCVD (arteriosclerotic cardiovascular disease) 02/16/2018   Periumbilical abdominal pain 02/16/2018   GI bleed 02/16/2018   Iron deficiency anemia, unspecified 02/16/2018   Chronic liver disease 02/16/2018   Carrier of alpha-1-antitrypsin deficiency 02/16/2018   History of colonic polyps 02/16/2018   Abnormal liver CT 02/16/2018   Elevated liver enzymes 02/16/2018   History of hepatitis C 02/16/2018   Abnormal findings on imaging test 02/16/2018   Helicobacter pylori gastritis  02/16/2018   Gait instability 02/13/2018   Mixed hyperlipidemia 02/13/2018    Atherosclerosis of native coronary artery of native heart with stable angina pectoris (HCC) 02/12/2018   Centrilobular emphysema (HCC) 02/12/2018   Anemia 02/12/2018   Benign neoplasm of cecum 01/16/2018   Heterozygous alpha 1-antitrypsin deficiency (HCC) 01/16/2018   Gastroesophageal reflux disease 01/16/2018   Bronchiectasis without complication (HCC) 05/16/2016   Moderate episode of recurrent major depressive disorder (HCC) 04/14/2016   Bloody stool 09/15/2015   Left lower quadrant pain 09/15/2015   Long term (current) use of antithrombotics/antiplatelets 09/15/2015   Dyspnea 05/18/2015   Anxiety 11/14/2011   DJD (degenerative joint disease) 11/14/2011   Abnormal stress test 04/20/2010   Dyslipidemia 04/20/2010   Status post coronary artery stent placement 04/20/2010   CAD (coronary artery disease) 04/20/2010    Social History   Tobacco Use   Smoking status: Never   Smokeless tobacco: Never  Substance Use Topics   Alcohol  use: Never    Allergies  Allergen Reactions   Shellfish Allergy Anaphylaxis    Ended up in the ED after eating shellfish, caused diarrhea and severe GI upset , unsure if caused SOB   Shellfish-Derived Products     Other reaction(s): Not available   Sulfa Antibiotics     Current Meds  Medication Sig   albuterol  (VENTOLIN  HFA) 108 (90 Base) MCG/ACT inhaler TAKE 2 PUFFS BY MOUTH EVERY 6 HOURS AS NEEDED FOR WHEEZE OR SHORTNESS OF BREATH   alendronate (FOSAMAX) 70 MG tablet alendronate 70 mg tablet  TAKE 1 TABLET BY MOUTH ONCE WEEKLY   celecoxib  (CELEBREX ) 100 MG capsule TAKE 1 CAPSULE BY MOUTH EVERY DAY   clopidogrel  (PLAVIX ) 75 MG tablet TAKE 1 TABLET BY MOUTH EVERY DAY   diphenhydramine-acetaminophen  (TYLENOL  PM) 25-500 MG TABS tablet Take 1 tablet by mouth at bedtime as needed.   DULoxetine  (CYMBALTA ) 60 MG capsule Take 60 mg by mouth daily.   isosorbide  mononitrate (IMDUR ) 30 MG 24 hr tablet Take 1 tablet (30 mg total) by mouth daily.   losartan   (COZAAR ) 50 MG tablet Take 1 tablet by mouth daily.   nitroGLYCERIN  (NITROSTAT ) 0.4 MG SL tablet Place 1 tablet (0.4 mg total) under the tongue every 5 (five) minutes as needed for chest pain.   pantoprazole  (PROTONIX ) 20 MG tablet Take 20 mg by mouth daily.   rosuvastatin  (CRESTOR ) 10 MG tablet TAKE 1 TABLET BY MOUTH EVERY DAY   TRELEGY ELLIPTA  100-62.5-25 MCG/ACT AEPB TAKE 1 PUFF BY MOUTH EVERY DAY    Immunization History  Administered Date(s) Administered   Fluad Quad(high Dose 65+) 10/24/2018   Influenza Split 11/01/2013   Influenza, High Dose Seasonal PF 10/28/2016, 11/03/2017, 11/01/2022   Influenza,inj,Quad PF,6+ Mos 12/05/2014   Influenza-Unspecified 10/14/2015, 10/28/2016, 10/31/2016, 10/01/2021   Pneumococcal Conjugate-13 07/14/2014   Pneumococcal Polysaccharide-23 07/29/2015        Objective:     BP 122/70 (BP Location: Right Arm, Cuff Size: Normal)   Pulse 100   Temp (!) 97.3 F (36.3 C)   Ht 5' 5 (1.651 m)   Wt 140 lb (63.5 kg) Comment: per patient. in a wheelchair today  SpO2 96%   BMI 23.30 kg/m   SpO2: 96 % O2 Device: None (Room air)  GENERAL: Elderly woman, presents in transport chair, no conversational dyspnea, awake, alert and oriented. HEAD: Normocephalic, atraumatic.  EYES: Pupils equal, round, reactive to light.  No scleral icterus.  MOUTH: Dentition intact, oral mucosa moist NECK: Supple. No thyromegaly. Trachea midline. No  JVD.  No adenopathy. PULMONARY: Good air entry bilaterally.  No adventitious sounds. CARDIOVASCULAR: S1 and S2. Regular rate and rhythm.  No rubs, murmurs or gallops heard. ABDOMEN: Benign.   MUSCULOSKELETAL: No joint deformity, no clubbing, no edema.  NEUROLOGIC: Gait not tested, no overt focal deficit. SKIN: Intact,warm,dry. PSYCH: Mood and behavior normal.   Assessment & Plan:     ICD-10-CM   1. Moderate persistent asthma without complication  J45.40     2. Shortness of breath  R06.02     3. Bronchiectasis without  complication (HCC)  J47.9     4. Hiatal hernia with gastroesophageal reflux  K44.9    K21.9     5. Heterozygous alpha 1-antitrypsin deficiency (HCC)  E88.01      Discussion:    Asthma Moderate persistent asthma. Reports Trelegy helps with breathing but causes hoarseness, likely due to the inhaler. Hoarseness present even before Trelegy, possibly due to previous inhalers. Compliant with rinsing mouth after use. Discussed alternative inhalers that are tricky to use and not well covered by insurance. - Advise rinsing mouth with water containing a small amount of baking soda after using the inhaler to neutralize the medication and reduce hoarseness - Alternatively, hoarseness may be secondary to presbyphonia - Continue Trelegy  Bronchiectasis Chronic condition. No acute exacerbation noted. Clear upon auscultation. - Continue current management  Hiatal Hernia with Reflux Chronic condition. No specific symptoms or issues discussed. - Continue current management  Alpha-1 Antitrypsin Deficiency (Phenotype MZ) Heterogeneous alpha-1 antitrypsin deficiency. No specific symptoms or issues discussed. - Continue current management  General Health Maintenance Up-to-date on vaccinations, including flu vaccine. - Continue routine vaccinations as per schedule  Follow-up - Schedule follow-up appointment in six months.     Advised if symptoms do not improve or worsen, to please contact office for sooner follow up or seek emergency care.    I spent 30 minutes of dedicated to the care of this patient on the date of this encounter to include pre-visit review of records, face-to-face time with the patient discussing conditions above, post visit ordering of testing, clinical documentation with the electronic health record, making appropriate referrals as documented, and communicating necessary findings to members of the patients care team.   C. Leita Sanders, MD [facility] [place]    *This note was generated using voice recognition software/Dragon and/or AI transcription program.  Despite best efforts to proofread, errors can occur which can change the meaning. Any transcriptional errors that result from this process are unintentional and may not be fully corrected at the time of dictation.

## 2023-02-13 ENCOUNTER — Encounter: Payer: Self-pay | Admitting: Pulmonary Disease

## 2023-04-26 ENCOUNTER — Other Ambulatory Visit: Payer: Self-pay

## 2023-04-26 ENCOUNTER — Emergency Department

## 2023-04-26 ENCOUNTER — Emergency Department
Admission: EM | Admit: 2023-04-26 | Discharge: 2023-04-26 | Disposition: A | Discharge status: Home / Self Care | Attending: Emergency Medicine | Admitting: Emergency Medicine

## 2023-04-26 DIAGNOSIS — Z23 Encounter for immunization: Secondary | ICD-10-CM | POA: Diagnosis not present

## 2023-04-26 DIAGNOSIS — I6782 Cerebral ischemia: Secondary | ICD-10-CM | POA: Insufficient documentation

## 2023-04-26 DIAGNOSIS — W19XXXA Unspecified fall, initial encounter: Secondary | ICD-10-CM

## 2023-04-26 DIAGNOSIS — W1830XA Fall on same level, unspecified, initial encounter: Secondary | ICD-10-CM | POA: Diagnosis not present

## 2023-04-26 DIAGNOSIS — M79644 Pain in right finger(s): Secondary | ICD-10-CM | POA: Diagnosis present

## 2023-04-26 DIAGNOSIS — M25552 Pain in left hip: Secondary | ICD-10-CM | POA: Diagnosis not present

## 2023-04-26 DIAGNOSIS — S0990XA Unspecified injury of head, initial encounter: Secondary | ICD-10-CM | POA: Diagnosis not present

## 2023-04-26 DIAGNOSIS — M25562 Pain in left knee: Secondary | ICD-10-CM | POA: Insufficient documentation

## 2023-04-26 DIAGNOSIS — S61214A Laceration without foreign body of right ring finger without damage to nail, initial encounter: Secondary | ICD-10-CM | POA: Diagnosis not present

## 2023-04-26 LAB — URINALYSIS, ROUTINE W REFLEX MICROSCOPIC
Bilirubin Urine: NEGATIVE
Glucose, UA: NEGATIVE mg/dL
Hgb urine dipstick: NEGATIVE
Ketones, ur: NEGATIVE mg/dL
Nitrite: NEGATIVE
Protein, ur: NEGATIVE mg/dL
Specific Gravity, Urine: 1.015 (ref 1.005–1.030)
pH: 6 (ref 5.0–8.0)

## 2023-04-26 LAB — COMPREHENSIVE METABOLIC PANEL
ALT: 12 U/L (ref 0–44)
AST: 21 U/L (ref 15–41)
Albumin: 3.2 g/dL — ABNORMAL LOW (ref 3.5–5.0)
Alkaline Phosphatase: 81 U/L (ref 38–126)
Anion gap: 7 (ref 5–15)
BUN: 23 mg/dL (ref 8–23)
CO2: 26 mmol/L (ref 22–32)
Calcium: 8.7 mg/dL — ABNORMAL LOW (ref 8.9–10.3)
Chloride: 106 mmol/L (ref 98–111)
Creatinine, Ser: 0.87 mg/dL (ref 0.44–1.00)
GFR, Estimated: >60 mL/min (ref >60)
Glucose, Bld: 108 mg/dL — ABNORMAL HIGH (ref 70–99)
Potassium: 4.1 mmol/L (ref 3.5–5.1)
Sodium: 139 mmol/L (ref 135–145)
Total Bilirubin: 0.5 mg/dL (ref 0.0–1.2)
Total Protein: 6.9 g/dL (ref 6.5–8.1)

## 2023-04-26 LAB — CBC WITH DIFFERENTIAL/PLATELET
Abs Immature Granulocytes: 0.01 10*3/uL (ref 0.00–0.07)
Basophils Absolute: 0 10*3/uL (ref 0.0–0.1)
Basophils Relative: 1 %
Eosinophils Absolute: 0.1 10*3/uL (ref 0.0–0.5)
Eosinophils Relative: 2 %
HCT: 28.9 % — ABNORMAL LOW (ref 36.0–46.0)
Hemoglobin: 9.2 g/dL — ABNORMAL LOW (ref 12.0–15.0)
Immature Granulocytes: 0 %
Lymphocytes Relative: 14 %
Lymphs Abs: 0.6 10*3/uL — ABNORMAL LOW (ref 0.7–4.0)
MCH: 29.7 pg (ref 26.0–34.0)
MCHC: 31.8 g/dL (ref 30.0–36.0)
MCV: 93.2 fL (ref 80.0–100.0)
Monocytes Absolute: 0.4 10*3/uL (ref 0.1–1.0)
Monocytes Relative: 9 %
Neutro Abs: 3.3 10*3/uL (ref 1.7–7.7)
Neutrophils Relative %: 74 %
Platelets: 210 10*3/uL (ref 150–400)
RBC: 3.1 MIL/uL — ABNORMAL LOW (ref 3.87–5.11)
RDW: 15.1 % (ref 11.5–15.5)
WBC: 4.4 10*3/uL (ref 4.0–10.5)
nRBC: 0 % (ref 0.0–0.2)

## 2023-04-26 LAB — TROPONIN I (HIGH SENSITIVITY)
Troponin I (High Sensitivity): 8 ng/L (ref <18)
Troponin I (High Sensitivity): 8 ng/L (ref <18)

## 2023-04-26 MED ORDER — OXYCODONE HCL 5 MG PO TABS
2.5000 mg | ORAL_TABLET | Freq: Once | ORAL | Status: AC
  Administered 2023-04-26: 2.5 mg via ORAL
  Filled 2023-04-26: qty 1

## 2023-04-26 MED ORDER — LIDOCAINE 5 % EX PTCH: 1.0000 | MEDICATED_PATCH | Freq: Two times a day (BID) | CUTANEOUS | 0 refills | Status: AC

## 2023-04-26 MED ORDER — TETANUS-DIPHTH-ACELL PERTUSSIS 5-2.5-18.5 LF-MCG/0.5 IM SUSY
0.5000 mL | PREFILLED_SYRINGE | Freq: Once | INTRAMUSCULAR | Status: AC
  Administered 2023-04-26: 0.5 mL via INTRAMUSCULAR
  Filled 2023-04-26: qty 0.5

## 2023-04-26 MED ORDER — ACETAMINOPHEN 500 MG PO TABS
1000.0000 mg | ORAL_TABLET | Freq: Once | ORAL | Status: AC
  Administered 2023-04-26: 1000 mg via ORAL
  Filled 2023-04-26: qty 2

## 2023-04-26 MED ORDER — LIDOCAINE 5 % EX PTCH
1.0000 | MEDICATED_PATCH | CUTANEOUS | Status: DC
  Administered 2023-04-26: 1 via TRANSDERMAL
  Filled 2023-04-26: qty 1

## 2023-04-26 NOTE — ED Provider Notes (Addendum)
 [facility] Provider Note    Event Date/Time   First MD Initiated Contact with Patient 04/26/23 1046     (approximate)   History   Fall   HPI  Terri Braun is a 89 y.o. female who comes in from [place] for a fall.  Patient was using a walker to go to the bathroom when she fell onto her left hip.  Patient denies hitting her head or losing consciousness.  Patient states that she is not sure why she keeps having these falls.  She states that she did not hit her head denies any new neck pain.  She reports just landing on her left hip.  She also reports about a week ago having a fall where she hurt her right foot.  She denies any new fevers, cough  Physical Exam   Triage Vital Signs: ED Triage Vitals [04/26/23 1041]  Encounter Vitals Group     BP      Systolic BP Percentile      Diastolic BP Percentile      Pulse      Resp      Temp      Temp src      SpO2      Weight      Height      Head Circumference      Peak Flow      Pain Score 5     Pain Loc      Pain Education      Exclude from Growth Chart     Most recent vital signs: Vitals:   04/26/23 1040  BP: (!) 161/63  Pulse: 81  Resp: 20  Temp: 98.3 F (36.8 C)  SpO2: 100%     General: Awake, no distress.  CV:  Good peripheral perfusion.  Resp:  Normal effort.  Abd:  No distention.  Other:  Superficial cut on her DIP about 1 cm of her right fourth digit.  Pain on the left hip.  No chest wall tenderness no abdominal tenderness no hematoma noted to the head.  No C-spine tenderness No tenderness on the foot.  She is got tenderness on her left hip and left knee  ED Results / Procedures / Treatments   Labs (all labs ordered are listed, but only abnormal results are displayed) Labs Reviewed  CBC WITH DIFFERENTIAL/PLATELET  COMPREHENSIVE METABOLIC PANEL  URINALYSIS, ROUTINE W REFLEX MICROSCOPIC  TROPONIN I (HIGH SENSITIVITY)     EKG  My interpretation of EKG:  Normal sinus  rate 79 without any ST elevation or T wave inversions, normal intervals  RADIOLOGY I have reviewed the xray personally and interpreted and possible hip fracture    PROCEDURES:  Critical Care performed: No  .Laceration Repair  Date/Time: 04/26/2023 10:51 AM  Performed by: Concha Se, MD Authorized by: Concha Se, MD   Consent:    Consent obtained:  Verbal   Consent given by:  Patient   Risks discussed:  Infection Universal protocol:    Patient identity confirmed:  Verbally with patient Anesthesia:    Anesthesia method:  None Laceration details:    Location:  Finger   Finger location:  R ring finger   Length (cm):  1   Depth (mm):  0.1 Exploration:    Limited defect created (wound extended): no   Treatment:    Area cleansed with:  Saline   Irrigation volume:  20cc   Irrigation method:  Syringe Skin repair:  Repair method:  Tissue adhesive Approximation:    Approximation:  Close    MEDICATIONS ORDERED IN ED: Medications  lidocaine (LIDODERM) 5 % 1 patch (1 patch Transdermal Patch Applied 04/26/23 1057)  Tdap (BOOSTRIX) injection 0.5 mL (0.5 mLs Intramuscular Given 04/26/23 1058)  acetaminophen (TYLENOL) tablet 1,000 mg (1,000 mg Oral Given 04/26/23 1053)  oxyCODONE (Oxy IR/ROXICODONE) immediate release tablet 2.5 mg (2.5 mg Oral Given 04/26/23 1225)     IMPRESSION / MDM / ASSESSMENT AND PLAN / ED COURSE  I reviewed the triage vital signs and the nursing notes.   Patient's presentation is most consistent with acute presentation with potential threat to life or bodily function.   Patient comes in for a fall.  Patient reports not hitting her head.  Given she has had 2 falls recently I will get some basic blood work, urine to evaluate for any potential cause of the falls.  Patient really denies any other new symptoms.  X-rays will be ordered to evaluate for any fractures, dislocation.  Patient has multiple healing fractures of the foot, I do not feel like  putting patient in a postop shoe would be beneficial given this could just lead to additional falls.  X-ray of her hip showed some lucency over the greater trochanter but given patient is tender we will get CT pelvis to further evaluate  Troponins are negative x 2.  Urine looks consistent with contamination. Hemoglobin around baseline.  CT head and neck are negative.  CT pelvis without any evidence of fracture  We discussed immobilization or postop shoe of her foot but these are healed fractures and I suspect that immobilizing or putting a postop shoe could lead to increased number of falls due to her not being used to something like this on her foot.  Family is agreeable we have opted to hold off we discussed wearing hard soled shoes I can follow-up with her primary care doctor  Patient was able to stand up and ambulate.  This is at her baseline.  We discussed that they should start working on patient getting into assisted living given she does move very slowly and I suspect could benefit from having more care we considered admission for placement but after discussion with daughter they are working on this outpatient and given she can ambulate today they feel comfortable with her going back home she is got a lift chair at home that they can use as needed.    The patient is on the cardiac monitor to evaluate for evidence of arrhythmia and/or significant heart rate changes.      FINAL CLINICAL IMPRESSION(S) / ED DIAGNOSES   Final diagnoses:  Fall, initial encounter     Rx / DC Orders   ED Discharge Orders     None        Note:  This document was prepared using Dragon voice recognition software and may include unintentional dictation errors.   Concha Se, MD 04/26/23 1514    Concha Se, MD 04/26/23 [phone]

## 2023-04-26 NOTE — ED Triage Notes (Signed)
 [facility] reports pt coming from [facility] for a fall. Pt was using her walker to go to the bathroom and fell landing on her side. Pt c/o left hip pain from the fall. Pt w/small cut ot right ring finger. Denies any LOC or hitting her head. Pt is on blood thinners.

## 2023-04-26 NOTE — Discharge Instructions (Addendum)
 Workup was negative for anything acute today however she can return to the ER if she develops increasing weakness, falls or any other concerns.  IMPRESSION: 1. No definite acute fracture is identified. 2. Interval healing of the previously seen fracture of the distal second metatarsal shaft. 3. New but still chronic/remote healed fracture of the distal third metatarsal shaft. 4. New but healed fracture of the 5th distal metatarsal neck with mild medial apex angulation. 5. Moderate to high-grade hallux valgus.

## 2023-05-02 ENCOUNTER — Emergency Department

## 2023-05-02 ENCOUNTER — Other Ambulatory Visit: Payer: Self-pay

## 2023-05-02 DIAGNOSIS — Z91013 Allergy to seafood: Secondary | ICD-10-CM

## 2023-05-02 DIAGNOSIS — Z7983 Long term (current) use of bisphosphonates: Secondary | ICD-10-CM

## 2023-05-02 DIAGNOSIS — Z90710 Acquired absence of both cervix and uterus: Secondary | ICD-10-CM

## 2023-05-02 DIAGNOSIS — K5900 Constipation, unspecified: Secondary | ICD-10-CM | POA: Diagnosis present

## 2023-05-02 DIAGNOSIS — R296 Repeated falls: Secondary | ICD-10-CM | POA: Diagnosis present

## 2023-05-02 DIAGNOSIS — S82142A Displaced bicondylar fracture of left tibia, initial encounter for closed fracture: Secondary | ICD-10-CM | POA: Diagnosis not present

## 2023-05-02 DIAGNOSIS — Z9181 History of falling: Secondary | ICD-10-CM

## 2023-05-02 DIAGNOSIS — N390 Urinary tract infection, site not specified: Secondary | ICD-10-CM | POA: Diagnosis present

## 2023-05-02 DIAGNOSIS — Z79899 Other long term (current) drug therapy: Secondary | ICD-10-CM

## 2023-05-02 DIAGNOSIS — M1712 Unilateral primary osteoarthritis, left knee: Secondary | ICD-10-CM | POA: Diagnosis present

## 2023-05-02 DIAGNOSIS — W07XXXA Fall from chair, initial encounter: Secondary | ICD-10-CM | POA: Diagnosis present

## 2023-05-02 DIAGNOSIS — K219 Gastro-esophageal reflux disease without esophagitis: Secondary | ICD-10-CM | POA: Diagnosis present

## 2023-05-02 DIAGNOSIS — Z602 Problems related to living alone: Secondary | ICD-10-CM | POA: Diagnosis present

## 2023-05-02 DIAGNOSIS — Z8249 Family history of ischemic heart disease and other diseases of the circulatory system: Secondary | ICD-10-CM

## 2023-05-02 DIAGNOSIS — I10 Essential (primary) hypertension: Secondary | ICD-10-CM | POA: Diagnosis present

## 2023-05-02 DIAGNOSIS — Z803 Family history of malignant neoplasm of breast: Secondary | ICD-10-CM

## 2023-05-02 DIAGNOSIS — Z7902 Long term (current) use of antithrombotics/antiplatelets: Secondary | ICD-10-CM

## 2023-05-02 DIAGNOSIS — Y92129 Unspecified place in nursing home as the place of occurrence of the external cause: Secondary | ICD-10-CM

## 2023-05-02 DIAGNOSIS — I2510 Atherosclerotic heart disease of native coronary artery without angina pectoris: Secondary | ICD-10-CM | POA: Diagnosis present

## 2023-05-02 DIAGNOSIS — J449 Chronic obstructive pulmonary disease, unspecified: Secondary | ICD-10-CM | POA: Diagnosis present

## 2023-05-02 DIAGNOSIS — Z751 Person awaiting admission to adequate facility elsewhere: Secondary | ICD-10-CM

## 2023-05-02 DIAGNOSIS — E039 Hypothyroidism, unspecified: Secondary | ICD-10-CM | POA: Diagnosis present

## 2023-05-02 DIAGNOSIS — Z955 Presence of coronary angioplasty implant and graft: Secondary | ICD-10-CM

## 2023-05-02 DIAGNOSIS — R54 Age-related physical debility: Secondary | ICD-10-CM | POA: Diagnosis present

## 2023-05-02 DIAGNOSIS — E785 Hyperlipidemia, unspecified: Secondary | ICD-10-CM | POA: Diagnosis present

## 2023-05-02 DIAGNOSIS — S82832A Other fracture of upper and lower end of left fibula, initial encounter for closed fracture: Secondary | ICD-10-CM | POA: Diagnosis present

## 2023-05-02 DIAGNOSIS — Z7951 Long term (current) use of inhaled steroids: Secondary | ICD-10-CM

## 2023-05-02 NOTE — ED Triage Notes (Signed)
 Pt to ED via [facility] from [facility] assisted living c/o left knee pain after a fall tonight. Pt was trying to get from her walker to her recliner when she slipped and left knee hit the floor.

## 2023-05-03 ENCOUNTER — Other Ambulatory Visit: Payer: Self-pay

## 2023-05-03 ENCOUNTER — Inpatient Hospital Stay
Admission: EM | Admit: 2023-05-03 | Discharge: 2023-05-11 | DRG: 563 | Disposition: A | Discharge status: Skilled Nursing Facility | Source: Skilled Nursing Facility | Attending: Internal Medicine | Admitting: Internal Medicine

## 2023-05-03 ENCOUNTER — Encounter: Payer: Self-pay | Admitting: Internal Medicine

## 2023-05-03 DIAGNOSIS — I10 Essential (primary) hypertension: Secondary | ICD-10-CM | POA: Diagnosis present

## 2023-05-03 DIAGNOSIS — N390 Urinary tract infection, site not specified: Secondary | ICD-10-CM

## 2023-05-03 DIAGNOSIS — S82209A Unspecified fracture of shaft of unspecified tibia, initial encounter for closed fracture: Secondary | ICD-10-CM | POA: Diagnosis present

## 2023-05-03 DIAGNOSIS — S82143A Displaced bicondylar fracture of unspecified tibia, initial encounter for closed fracture: Secondary | ICD-10-CM | POA: Diagnosis present

## 2023-05-03 DIAGNOSIS — I2510 Atherosclerotic heart disease of native coronary artery without angina pectoris: Secondary | ICD-10-CM | POA: Diagnosis present

## 2023-05-03 DIAGNOSIS — S82832A Other fracture of upper and lower end of left fibula, initial encounter for closed fracture: Secondary | ICD-10-CM

## 2023-05-03 DIAGNOSIS — K219 Gastro-esophageal reflux disease without esophagitis: Secondary | ICD-10-CM | POA: Diagnosis present

## 2023-05-03 DIAGNOSIS — S82142A Displaced bicondylar fracture of left tibia, initial encounter for closed fracture: Secondary | ICD-10-CM | POA: Diagnosis not present

## 2023-05-03 DIAGNOSIS — J449 Chronic obstructive pulmonary disease, unspecified: Secondary | ICD-10-CM

## 2023-05-03 HISTORY — DX: Unspecified fracture of shaft of unspecified tibia, initial encounter for closed fracture: S82.209A

## 2023-05-03 LAB — BASIC METABOLIC PANEL WITH GFR
Anion gap: 8 (ref 5–15)
BUN: 28 mg/dL — ABNORMAL HIGH (ref 8–23)
CO2: 25 mmol/L (ref 22–32)
Calcium: 8.8 mg/dL — ABNORMAL LOW (ref 8.9–10.3)
Chloride: 104 mmol/L (ref 98–111)
Creatinine, Ser: 0.99 mg/dL (ref 0.44–1.00)
GFR, Estimated: 54 mL/min — ABNORMAL LOW (ref >60)
Glucose, Bld: 120 mg/dL — ABNORMAL HIGH (ref 70–99)
Potassium: 4.3 mmol/L (ref 3.5–5.1)
Sodium: 137 mmol/L (ref 135–145)

## 2023-05-03 LAB — URINALYSIS, COMPLETE (UACMP) WITH MICROSCOPIC
Bilirubin Urine: NEGATIVE
Glucose, UA: NEGATIVE mg/dL
Hgb urine dipstick: NEGATIVE
Ketones, ur: NEGATIVE mg/dL
Nitrite: NEGATIVE
Protein, ur: NEGATIVE mg/dL
Specific Gravity, Urine: 1.02 (ref 1.005–1.030)
WBC, UA: >50 WBC/hpf (ref 0–5)
pH: 5 (ref 5.0–8.0)

## 2023-05-03 LAB — CBC WITH DIFFERENTIAL/PLATELET
Abs Immature Granulocytes: 0.02 10*3/uL (ref 0.00–0.07)
Basophils Absolute: 0 10*3/uL (ref 0.0–0.1)
Basophils Relative: 0 %
Eosinophils Absolute: 0.1 10*3/uL (ref 0.0–0.5)
Eosinophils Relative: 1 %
HCT: 28.3 % — ABNORMAL LOW (ref 36.0–46.0)
Hemoglobin: 9 g/dL — ABNORMAL LOW (ref 12.0–15.0)
Immature Granulocytes: 0 %
Lymphocytes Relative: 11 %
Lymphs Abs: 0.7 10*3/uL (ref 0.7–4.0)
MCH: 30 pg (ref 26.0–34.0)
MCHC: 31.8 g/dL (ref 30.0–36.0)
MCV: 94.3 fL (ref 80.0–100.0)
Monocytes Absolute: 0.6 10*3/uL (ref 0.1–1.0)
Monocytes Relative: 9 %
Neutro Abs: 5 10*3/uL (ref 1.7–7.7)
Neutrophils Relative %: 79 %
Platelets: 210 10*3/uL (ref 150–400)
RBC: 3 MIL/uL — ABNORMAL LOW (ref 3.87–5.11)
RDW: 15.3 % (ref 11.5–15.5)
WBC: 6.4 10*3/uL (ref 4.0–10.5)
nRBC: 0 % (ref 0.0–0.2)

## 2023-05-03 LAB — TROPONIN I (HIGH SENSITIVITY): Troponin I (High Sensitivity): 8 ng/L (ref <18)

## 2023-05-03 LAB — PROTIME-INR
INR: 1 (ref 0.8–1.2)
Prothrombin Time: 13.6 s (ref 11.4–15.2)

## 2023-05-03 MED ORDER — ISOSORBIDE MONONITRATE ER 30 MG PO TB24
30.0000 mg | ORAL_TABLET | Freq: Every day | ORAL | Status: DC
  Administered 2023-05-03 – 2023-05-11 (×9): 30 mg via ORAL
  Filled 2023-05-03 (×9): qty 1

## 2023-05-03 MED ORDER — MORPHINE SULFATE (PF) 4 MG/ML IV SOLN
4.0000 mg | Freq: Once | INTRAVENOUS | Status: AC
  Administered 2023-05-03: 4 mg via INTRAVENOUS
  Filled 2023-05-03: qty 1

## 2023-05-03 MED ORDER — UMECLIDINIUM BROMIDE 62.5 MCG/ACT IN AEPB
1.0000 | INHALATION_SPRAY | Freq: Every day | RESPIRATORY_TRACT | Status: DC
  Administered 2023-05-03 – 2023-05-11 (×9): 1 via RESPIRATORY_TRACT
  Filled 2023-05-03 (×3): qty 7

## 2023-05-03 MED ORDER — ROSUVASTATIN CALCIUM 10 MG PO TABS
10.0000 mg | ORAL_TABLET | Freq: Every day | ORAL | Status: DC
Start: 2023-05-03 — End: 2023-05-11
  Administered 2023-05-03 – 2023-05-10 (×8): 10 mg via ORAL
  Filled 2023-05-03 (×9): qty 1

## 2023-05-03 MED ORDER — SENNOSIDES-DOCUSATE SODIUM 8.6-50 MG PO TABS
1.0000 | ORAL_TABLET | Freq: Every evening | ORAL | Status: DC | PRN
  Administered 2023-05-05 – 2023-05-10 (×3): 1 via ORAL
  Filled 2023-05-03 (×3): qty 1

## 2023-05-03 MED ORDER — ONDANSETRON HCL 4 MG/2ML IJ SOLN
4.0000 mg | Freq: Once | INTRAMUSCULAR | Status: AC
  Administered 2023-05-03: 4 mg via INTRAVENOUS
  Filled 2023-05-03: qty 2

## 2023-05-03 MED ORDER — DULOXETINE HCL 30 MG PO CPEP
60.0000 mg | ORAL_CAPSULE | Freq: Every day | ORAL | Status: DC
  Administered 2023-05-03 – 2023-05-11 (×9): 60 mg via ORAL
  Filled 2023-05-03 (×2): qty 1
  Filled 2023-05-03 (×7): qty 2

## 2023-05-03 MED ORDER — MELATONIN 5 MG PO TABS
ORAL_TABLET | ORAL | Status: AC
Start: 2023-05-03 — End: 2023-05-03
  Administered 2023-05-03: 5 mg via ORAL
  Filled 2023-05-03: qty 1

## 2023-05-03 MED ORDER — HYDROMORPHONE HCL 1 MG/ML IJ SOLN
0.5000 mg | INTRAMUSCULAR | Status: DC | PRN
  Administered 2023-05-03 – 2023-05-09 (×12): 0.5 mg via INTRAVENOUS
  Filled 2023-05-03 (×2): qty 0.5
  Filled 2023-05-03: qty 1
  Filled 2023-05-03 (×9): qty 0.5

## 2023-05-03 MED ORDER — LOSARTAN POTASSIUM 50 MG PO TABS
50.0000 mg | ORAL_TABLET | Freq: Every day | ORAL | Status: DC
Start: 2023-05-03 — End: 2023-05-11
  Administered 2023-05-03 – 2023-05-11 (×9): 50 mg via ORAL
  Filled 2023-05-03 (×9): qty 1

## 2023-05-03 MED ORDER — ALBUTEROL SULFATE (2.5 MG/3ML) 0.083% IN NEBU: 3.0000 mL | INHALATION_SOLUTION | Freq: Four times a day (QID) | RESPIRATORY_TRACT | Status: DC | PRN

## 2023-05-03 MED ORDER — PANTOPRAZOLE SODIUM 20 MG PO TBEC
20.0000 mg | DELAYED_RELEASE_TABLET | Freq: Every day | ORAL | Status: DC
  Administered 2023-05-03 – 2023-05-11 (×9): 20 mg via ORAL
  Filled 2023-05-03 (×10): qty 1

## 2023-05-03 MED ORDER — ONDANSETRON HCL 4 MG/2ML IJ SOLN: 4.0000 mg | Freq: Four times a day (QID) | INTRAMUSCULAR | Status: DC | PRN

## 2023-05-03 MED ORDER — ENOXAPARIN SODIUM 40 MG/0.4ML IJ SOSY
40.0000 mg | PREFILLED_SYRINGE | INTRAMUSCULAR | Status: DC
  Administered 2023-05-03 – 2023-05-11 (×9): 40 mg via SUBCUTANEOUS
  Filled 2023-05-03 (×9): qty 0.4

## 2023-05-03 MED ORDER — CLOPIDOGREL BISULFATE 75 MG PO TABS
75.0000 mg | ORAL_TABLET | Freq: Every day | ORAL | Status: DC
  Administered 2023-05-03 – 2023-05-11 (×9): 75 mg via ORAL
  Filled 2023-05-03 (×9): qty 1

## 2023-05-03 MED ORDER — MELATONIN 5 MG PO TABS
5.0000 mg | ORAL_TABLET | Freq: Every day | ORAL | Status: DC
  Administered 2023-05-04 – 2023-05-10 (×7): 5 mg via ORAL
  Filled 2023-05-03 (×8): qty 1

## 2023-05-03 MED ORDER — BISACODYL 5 MG PO TBEC
5.0000 mg | DELAYED_RELEASE_TABLET | Freq: Every day | ORAL | Status: DC | PRN
  Administered 2023-05-05 – 2023-05-07 (×3): 5 mg via ORAL
  Filled 2023-05-03 (×3): qty 1

## 2023-05-03 MED ORDER — HYDROCODONE-ACETAMINOPHEN 5-325 MG PO TABS
1.0000 | ORAL_TABLET | Freq: Four times a day (QID) | ORAL | Status: DC | PRN
  Administered 2023-05-03 – 2023-05-05 (×5): 1 via ORAL
  Administered 2023-05-05: 2 via ORAL
  Administered 2023-05-05 – 2023-05-07 (×4): 1 via ORAL
  Administered 2023-05-07 – 2023-05-10 (×8): 2 via ORAL
  Administered 2023-05-11: 1 via ORAL
  Filled 2023-05-03: qty 2
  Filled 2023-05-03 (×2): qty 1
  Filled 2023-05-03 (×2): qty 2
  Filled 2023-05-03 (×4): qty 1
  Filled 2023-05-03: qty 2
  Filled 2023-05-03 (×2): qty 1
  Filled 2023-05-03: qty 2
  Filled 2023-05-03: qty 1
  Filled 2023-05-03 (×4): qty 2
  Filled 2023-05-03: qty 1

## 2023-05-03 MED ORDER — FLUTICASONE FUROATE-VILANTEROL 100-25 MCG/ACT IN AEPB
1.0000 | INHALATION_SPRAY | Freq: Every day | RESPIRATORY_TRACT | Status: DC
  Administered 2023-05-03 – 2023-05-11 (×9): 1 via RESPIRATORY_TRACT
  Filled 2023-05-03 (×2): qty 28

## 2023-05-03 NOTE — H&P (Signed)
 History and Physical    Terri Braun RUE:[id number] DOB: 02-21-1929 DOA: 05/03/2023  PCP: Bosie Clos, MD (Confirm with patient/family/NH records and if not entered, this has to be entered at [facility] point of entry) Patient coming from: Home  I have personally briefly reviewed patient's old medical records in [facility]  Chief Complaint: I fell again and broke my knee  HPI: Terri Braun is a 89 y.o. female with medical history significant of CAD status post stenting on Plavix, HTN, hypothyroidism, COPD, severe left knee OA, presented with recurrent fall and fracture left knee.  Patient has severe left knee OA, for which she has been following with orthopedic surgery.  After failed steroid injections, orthopedic surgery proposed " surgery" 2 to 3 years ago.  Patient however declined, which explained as " I have verified heart Hx".  Recently she has had worsening of left knee pain.  At baseline she uses walker to ambulate however increasingly she found it more difficult to put weight on left leg.  She sustained a fall 1 week ago, and hit her left hip and came to ED, workup negative for fracture or dislocation patient was reassured and sent home.  Last night, patient fell again while walking to bathroom, and this time she broke her knee and unable to get up herself again because of excruciating pain.  She denied any prodromal syncope or near syncope no chest pain before or after the incident.. ED Course: Afebrile, blood pressure 140/60, nontachycardic not hypoxic.  CT left knee showed acute complete minimally displaced fracture of proximal tibial diaphysis and acute nondisplaced fracture of proximal fibula metaphyseal region, severe OA left knee.  Review of Systems: As per HPI otherwise 14 point review of systems negative.    Past Medical History:  Diagnosis Date   Anemia    ASCVD (arteriosclerotic cardiovascular disease)    COPD (chronic obstructive pulmonary disease) (HCC)     Diverticulosis    GERD (gastroesophageal reflux disease)    Hypertension    Hypothyroidism     Past Surgical History:  Procedure Laterality Date   APPENDECTOMY     BREAST SURGERY  1983   Lumpectomy   CHOLECYSTECTOMY     COLON SURGERY     COLONOSCOPY  11/04/2015   CORONARY ANGIOPLASTY WITH STENT PLACEMENT     NISSEN FUNDOPLICATION     TOTAL ABDOMINAL HYSTERECTOMY     With BSO     reports that she has never smoked. She has never used smokeless tobacco. She reports that she does not drink alcohol and does not use drugs.  Allergies  Allergen Reactions   Shellfish Allergy Anaphylaxis    Ended up in the ED after eating shellfish, caused diarrhea and severe GI upset , unsure if caused SOB   Shellfish-Derived Products     Other reaction(s): Not available   Sulfa Antibiotics     Family History  Problem Relation Age of Onset   Coronary artery disease Mother    Heart disease Mother    Crohn's disease Son    Cancer Other        other family member with breast cancer    Prior to Admission medications   Medication Sig Start Date End Date Taking? Authorizing Provider  acetaminophen (TYLENOL) 500 MG tablet Take 500 mg by mouth every 4 (four) hours as needed. Patient not taking: Reported on 02/09/2023    [provider]  albuterol (VENTOLIN HFA) 108 (90 Base) MCG/ACT inhaler TAKE 2 PUFFS BY MOUTH  EVERY 6 HOURS AS NEEDED FOR WHEEZE OR SHORTNESS OF BREATH 04/27/22   Salena Saner, MD  alendronate (FOSAMAX) 70 MG tablet alendronate 70 mg tablet  TAKE 1 TABLET BY MOUTH ONCE WEEKLY    [provider]  celecoxib (CELEBREX) 100 MG capsule TAKE 1 CAPSULE BY MOUTH EVERY DAY 10/08/20   Bosie Clos, MD  clopidogrel (PLAVIX) 75 MG tablet TAKE 1 TABLET BY MOUTH EVERY DAY 06/21/21   Bosie Clos, MD  conjugated estrogens (PREMARIN) vaginal cream Apply one dab to Urethra daily at bedtime Patient not taking: Reported on 02/09/2023 03/25/21   Bosie Clos, MD   diphenhydramine-acetaminophen (TYLENOL PM) 25-500 MG TABS tablet Take 1 tablet by mouth at bedtime as needed.    [provider]  DULoxetine (CYMBALTA) 60 MG capsule Take 60 mg by mouth daily.    [provider]  isosorbide mononitrate (IMDUR) 30 MG 24 hr tablet Take 1 tablet (30 mg total) by mouth daily. 04/26/22   Ostwalt, Edmon Crape, PA-C  losartan (COZAAR) 50 MG tablet Take 1 tablet by mouth daily.    [provider]  nitroGLYCERIN (NITROSTAT) 0.4 MG SL tablet Place 1 tablet (0.4 mg total) under the tongue every 5 (five) minutes as needed for chest pain. 11/11/22   Furth, Cadence H, PA-C  omeprazole (PRILOSEC) 20 MG capsule Take 20 mg by mouth daily. PRN Patient not taking: Reported on 02/09/2023 06/08/22 06/08/23  [provider]  oxyCODONE (ROXICODONE) 5 MG immediate release tablet Take 1 tablet (5 mg total) by mouth every 6 (six) hours as needed for severe pain. Patient not taking: Reported on 02/09/2023 04/24/22   Phineas Semen, MD  pantoprazole (PROTONIX) 20 MG tablet Take 20 mg by mouth daily.    [provider]  rosuvastatin (CRESTOR) 10 MG tablet TAKE 1 TABLET BY MOUTH EVERY DAY 12/19/22   Antonieta Iba, MD  TRELEGY ELLIPTA 100-62.5-25 MCG/ACT AEPB TAKE 1 PUFF BY MOUTH EVERY DAY 01/30/23   Salena Saner, MD    Physical Exam: Vitals:   05/03/23 0500 05/03/23 0630 05/03/23 0800 05/03/23 0930  BP: (!) 186/67 (!) 167/61 (!) 146/75   Pulse: 81 80 85   Resp: 18 17 18    Temp:    98.2 F (36.8 C)  TempSrc:    Oral  SpO2: 95% 97% 95%   Weight:        Constitutional: NAD, calm, comfortable Vitals:   05/03/23 0500 05/03/23 0630 05/03/23 0800 05/03/23 0930  BP: (!) 186/67 (!) 167/61 (!) 146/75   Pulse: 81 80 85   Resp: 18 17 18    Temp:    98.2 F (36.8 C)  TempSrc:    Oral  SpO2: 95% 97% 95%   Weight:       Eyes: PERRL, lids and conjunctivae normal ENMT: Mucous membranes are moist. Posterior pharynx clear of any exudate or  lesions.Normal dentition.  Neck: normal, supple, no masses, no thyromegaly Respiratory: clear to auscultation bilaterally, no wheezing, no crackles. Normal respiratory effort. No accessory muscle use.  Cardiovascular: Regular rate and rhythm, no murmurs / rubs / gallops. No extremity edema. 2+ pedal pulses. No carotid bruits.  Abdomen: no tenderness, no masses palpated. No hepatosplenomegaly. Bowel sounds positive.  Musculoskeletal: no clubbing / cyanosis. No joint deformity upper and lower extremities. Good ROM, no contractures. Normal muscle tone.  Skin: no rashes, lesions, ulcers. No induration Neurologic: CN 2-12 grossly intact. Sensation intact, DTR normal. Strength 5/5 in all 4.  Psychiatric: Normal  judgment and insight. Alert and oriented x 3. Normal mood.     Labs on Admission: I have personally reviewed following labs and imaging studies  CBC: Recent Labs  Lab 04/26/23 1048 05/03/23 0224  WBC 4.4 6.4  NEUTROABS 3.3 5.0  HGB 9.2* 9.0*  HCT 28.9* 28.3*  MCV 93.2 94.3  PLT 210 210   Basic Metabolic Panel: Recent Labs  Lab 04/26/23 1048 05/03/23 0224  NA 139 137  K 4.1 4.3  CL 106 104  CO2 26 25  GLUCOSE 108* 120*  BUN 23 28*  CREATININE 0.87 0.99  CALCIUM 8.7* 8.8*   GFR: Estimated Creatinine Clearance: 33.3 mL/min (by C-G formula based on SCr of 0.99 mg/dL). Liver Function Tests: Recent Labs  Lab 04/26/23 1048  AST 21  ALT 12  ALKPHOS 81  BILITOT 0.5  PROT 6.9  ALBUMIN 3.2*   No results for input(s): "LIPASE", "AMYLASE" in the last 168 hours. No results for input(s): "AMMONIA" in the last 168 hours. Coagulation Profile: Recent Labs  Lab 05/03/23 0224  INR 1.0   Cardiac Enzymes: No results for input(s): "CKTOTAL", "CKMB", "CKMBINDEX", "TROPONINI" in the last 168 hours. BNP (last 3 results) No results for input(s): "PROBNP" in the last 8760 hours. HbA1C: No results for input(s): "HGBA1C" in the last 72 hours. CBG: No results for input(s):  "GLUCAP" in the last 168 hours. Lipid Profile: No results for input(s): "CHOL", "HDL", "LDLCALC", "TRIG", "CHOLHDL", "LDLDIRECT" in the last 72 hours. Thyroid Function Tests: No results for input(s): "TSH", "T4TOTAL", "FREET4", "T3FREE", "THYROIDAB" in the last 72 hours. Anemia Panel: No results for input(s): "VITAMINB12", "FOLATE", "FERRITIN", "TIBC", "IRON", "RETICCTPCT" in the last 72 hours. Urine analysis:    Component Value Date/Time   COLORURINE YELLOW (A) 04/26/2023 1238   APPEARANCEUR HAZY (A) 04/26/2023 1238   LABSPEC 1.015 04/26/2023 1238   PHURINE 6.0 04/26/2023 1238   GLUCOSEU NEGATIVE 04/26/2023 1238   HGBUR NEGATIVE 04/26/2023 1238   BILIRUBINUR NEGATIVE 04/26/2023 1238   BILIRUBINUR Negative 03/26/2021 0919   KETONESUR NEGATIVE 04/26/2023 1238   PROTEINUR NEGATIVE 04/26/2023 1238   UROBILINOGEN 0.2 03/26/2021 0919   NITRITE NEGATIVE 04/26/2023 1238   LEUKOCYTESUR TRACE (A) 04/26/2023 1238    Radiological Exams on Admission: CT Knee Left Wo Contrast Result Date: 05/03/2023 CLINICAL DATA:  Fall, left knee injury, tibial plateau fracture EXAM: CT OF THE LEFT KNEE WITHOUT CONTRAST TECHNIQUE: Multidetector CT imaging of the left knee was performed according to the standard protocol. Multiplanar CT image reconstructions were also generated. RADIATION DOSE REDUCTION: This exam was performed according to the departmental dose-optimization program which includes automated exposure control, adjustment of the mA and/or kV according to patient size and/or use of iterative reconstruction technique. COMPARISON:  None Available. FINDINGS: Bones/Joint/Cartilage Severe tricompartmental degenerate arthritis the left knee is noted with lateral subluxation the tibia and remodeling medial tibial plateau likely representing a combination degenerative change and possible injury. Multiple intra-articular loose bodies are identified within the medial gutter and within the posterior recess. Posterior  loose body likely represents an ununited remote posterior medial tibial plateau fracture fragment. There is resultant severe varus angulation the left knee by approximately 30-35%. There is an acute fracture of the proximal tibial diaphysis with minimal displacement. The fracture does appear complete with both transverse and longitudinal fracture planes, however, fracture fragments are in anatomic alignment. There is no intra-articular component of the fracture. There is, additionally, acute, nondisplaced fracture of the proximal fibular metaphyseal region best seen on sagittal image #  83/7 with fracture fragments in anatomic alignment. Ligaments Suboptimally assessed by CT. Muscles and Tendons Moderate fatty atrophy of the proximal left lower extremity musculature. Extensor mechanism intact. Soft tissues Large left knee effusion is present. Subcutaneous infiltration is seen within the proximal left lower extremity adjacent to the fracture plane. IMPRESSION: 1. Acute, complete, minimally displaced fracture of the proximal tibial diaphysis. 2. Acute, nondisplaced fracture of the proximal fibular metaphyseal region. 3. Severe tricompartmental degenerate arthritis of the left knee with lateral subluxation the tibia and remodeling of the medial tibial plateau likely representing a combination of degenerative change and possible injury. Multiple intra-articular loose bodies. 4. Large left knee effusion. Electronically Signed   By: Helyn Numbers M.D.   On: 05/03/2023 00:28   DG Knee Complete 4 Views Left Result Date: 05/02/2023 CLINICAL DATA:  knee pain EXAM: LEFT KNEE - COMPLETE 4+ VIEW COMPARISON:  None Available. FINDINGS: Acute nondisplaced metadiaphysis tibial fracture. Acute nondisplaced fibular neck fracture. Severe tricompartmental degenerative changes of the knee. No aggressive appearing focal bone abnormality. Subcutaneus soft tissue edema. IMPRESSION: 1. Acute nondisplaced metadiaphysis tibial fracture. 2.  Acute nondisplaced fibular neck fracture. 3. Severe tricompartmental degenerative changes of the knee. Electronically Signed   By: Tish Frederickson M.D.   On: 05/02/2023 23:27    EKG: Independently reviewed.  Sinus, no acute ST changes.  Assessment/Plan Principal Problem:   Tibial plateau fracture Active Problems:   Tibial fracture  (please populate well all problems here in Problem List. (For example, if patient is on BP meds at home and you resume or decide to hold them, it is a problem that needs to be her. Same for CAD, COPD, HLD and so on)  Left side tibial diaphysis, nondisplaced Left proximal fibula metaphyseal fracture, nondisplaced -Orthopedic surgery reviewed the case and recommend conservative management.  A left knee brace was placed on in ED and patient will receive pain management, and PT evaluation. -Low suspicion for syncope or near syncope  History of CAD -Denied any chest pain, EKG showed no acute ST changes. -Will send trop x1 -Continue Plavix and Crestor  HTN -Stable, continue Imdur, losartan  COPD -No acute concern -Continue Trelegy -Continue as needed albuterol   DVT prophylaxis: Lovenox Code Status: Full code Family Communication: ED physician talked to patient daughter at bedside Disposition Plan: Expect less than 2 midnight hospital stay Consults called: Orthopedic surgery was consulted once in the ED Admission status: MedSurg observation   Emeline General MD [facility] Pager [phone]  05/03/2023, 9:55 AM

## 2023-05-03 NOTE — Evaluation (Signed)
 Occupational Therapy Evaluation Patient Details Name: Terri Braun MRN: [id number] DOB: 04-Jun-1929 Today's Date: 05/03/2023   History of Present Illness   Terri Braun is a 89 y.o. female with medical history significant of CAD status post stenting on Plavix, HTN, hypothyroidism, COPD, severe left knee OA, presented with recurrent fall and Left proximal fibula metaphyseal fracture managed nonoperatively.   Clinical Impressions Terri Braun was seen for OT evaluation this date. Prior to hospital admission, pt was MOD I using RW for limited mobility, reports falls hx. Pt lives at [facility] IL[facility]. Pt currently requires MAX A x2 sup<>sit, fair sitting balance. MAX A don B socks, SETUP self-feeding in sitting. Pt would benefit from skilled OT to address noted impairments and functional limitations (see below for any additional details). Upon hospital discharge, recommend OT follow up.   If plan is discharge home, recommend the following:   Two people to help with walking and/or transfers;Two people to help with bathing/dressing/bathroom     Functional Status Assessment   Patient has had a recent decline in their functional status and demonstrates the ability to make significant improvements in function in a reasonable and predictable amount of time.     Equipment Recommendations   Other (comment) (defer)     Recommendations for Other Services         Precautions/Restrictions   Precautions Precautions: Fall Restrictions Weight Bearing Restrictions Per Provider Order: Yes LLE Weight Bearing Per Provider Order: Non weight bearing     Mobility Bed Mobility Overal bed mobility: Needs Assistance Bed Mobility: Supine to Sit, Sit to Supine     Supine to sit: Max assist, +2 for physical assistance Sit to supine: Max assist, +2 for physical assistance        Transfers                   General transfer comment: unsafe to attempt      Balance Overall balance  assessment: Needs assistance Sitting-balance support: Feet unsupported, Bilateral upper extremity supported Sitting balance-Leahy Scale: Fair                                     ADL either performed or assessed with clinical judgement   ADL Overall ADL's : Needs assistance/impaired                                       General ADL Comments: MAX A don B socks, SETUP self-feeding in sitting      Pertinent Vitals/Pain Pain Assessment Pain Assessment: 0-10 Pain Score: 8  Pain Location: L knee Pain Descriptors / Indicators: Discomfort Pain Intervention(s): Limited activity within patient's tolerance, Patient requesting pain meds-RN notified     Extremity/Trunk Assessment Upper Extremity Assessment Upper Extremity Assessment: Generalized weakness   Lower Extremity Assessment Lower Extremity Assessment: Generalized weakness LLE Deficits / Details: hinged knee brace in place       Communication Communication Communication: No apparent difficulties Factors Affecting Communication: Hearing impaired   Cognition Arousal: Alert Behavior During Therapy: WFL for tasks assessed/performed Cognition: History of cognitive impairments                               Following commands: Impaired Following commands impaired: Follows one step commands with increased time  Home Living Family/patient expects to be discharged to:: Private residence (Independent Living) Living Arrangements: Alone Available Help at Discharge: Family;Personal care attendant;Available PRN/intermittently Type of Home: Independent living facility Home Access: Elevator                         Additional Comments: [facility]. Since fall last week pt with increased care from PCAs      Prior Functioning/Environment Prior Level of Function : Needs assist;History of Falls (last six months)             Mobility Comments: Mod I  with mobility around the apartment. uses a rolling walker for short distance ambulation. needs assistance for getting in and out of the car ADLs Comments: meals are provided by the facility. assistance for lower body dressing and bathing    OT Problem List: Decreased strength;Decreased activity tolerance;Decreased range of motion;Impaired balance (sitting and/or standing)   OT Treatment/Interventions: Self-care/ADL training;Therapeutic exercise;DME and/or AE instruction;Therapeutic activities      OT Goals(Current goals can be found in the care plan section)   Acute Rehab OT Goals Patient Stated Goal: to improve pain OT Goal Formulation: With patient/family Time For Goal Achievement: 05/17/23 Potential to Achieve Goals: Fair ADL Goals Pt Will Perform Lower Body Dressing: with min assist;sitting/lateral leans Pt Will Transfer to Toilet: with mod assist;stand pivot transfer;bedside commode   OT Frequency:  Min 2X/week    Co-evaluation PT/OT/SLP Co-Evaluation/Treatment: Yes Reason for Co-Treatment: Complexity of the patient's impairments (multi-system involvement);To address functional/ADL transfers PT goals addressed during session: Mobility/safety with mobility OT goals addressed during session: ADL's and self-care      AM-PAC OT "6 Clicks" Daily Activity     Outcome Measure Help from another person eating meals?: None Help from another person taking care of personal grooming?: A Little Help from another person toileting, which includes using toliet, bedpan, or urinal?: A Lot Help from another person bathing (including washing, rinsing, drying)?: A Lot Help from another person to put on and taking off regular upper body clothing?: A Little Help from another person to put on and taking off regular lower body clothing?: A Lot 6 Click Score: 16   End of Session    Activity Tolerance: Patient tolerated treatment well Patient left: in bed;with call bell/phone within reach;with  family/visitor present  OT Visit Diagnosis: Other abnormalities of gait and mobility (R26.89);Muscle weakness (generalized) (M62.81)                Time: 1610-9604 OT Time Calculation (min): 19 min Charges:  OT General Charges $OT Visit: 1 Visit OT Evaluation $OT Eval Low Complexity: 1 Low  Kathie Dike, M.S. OTR/L  05/03/23, 1:49 PM  ascom [phone]

## 2023-05-03 NOTE — ED Notes (Signed)
 Pt resting eyes closed respirations even and unlabored, NAD, family at bedside, maintenance attempting to fix television at this time

## 2023-05-03 NOTE — Evaluation (Addendum)
 Physical Therapy Evaluation Patient Details Name: Terri Braun MRN: [id number] DOB: 07/07/1929 Today's Date: 05/03/2023  History of Present Illness   Patient is a 89 year old female presenting after fall while trying to get to her lift chair. She uses a walker or wheelchair at baseline due to chronic gait instability. Found to have minimally displaced left tibial plateau fracture and a nondisplaced proximal fibular fracture with hinged knee brace recommended. History of CAD status post stenting on Plavix, HTN, hypothyroidism, COPD, severe left knee OA.   Clinical Impression  Patient is agreeable to PT session. Supportive son-in-law at the bedside. Patient lives in independent living apartment but has assistance for ADLs at baseline. At least 2 recent falls. Patient is ambulatory short distances with rolling walker and has a wheelchair for community mobility. Today the patient required +2 person assistance for bed mobility. Sitting balance is fair. Activity tolerance is limited by pain in the left leg and fatigue with minimal activity. Patient will need increased assistance with mobility at discharge. Recommend rehabilitation < 3 hours/day after this hospital stay. PT will continue to follow to maximize independence and facilitate return to prior level of function.       If plan is discharge home, recommend the following: Two people to help with walking and/or transfers;Two people to help with bathing/dressing/bathroom;Assist for transportation;Help with stairs or ramp for entrance;Supervision due to cognitive status;Assistance with cooking/housework   Can travel by private vehicle   No    Equipment Recommendations None recommended by PT  Recommendations for Other Services       Functional Status Assessment Patient has had a recent decline in their functional status and demonstrates the ability to make significant improvements in function in a reasonable and predictable amount of time.      Precautions / Restrictions Precautions Precautions: Fall Restrictions Weight Bearing Restrictions Per Provider Order: Yes LLE Weight Bearing Per Provider Order: Non weight bearing (preseumed) Other Position/Activity Restrictions: hinged knee brace LLE, presumed NWB      Mobility  Bed Mobility Overal bed mobility: Needs Assistance Bed Mobility: Supine to Sit, Sit to Supine     Supine to sit: Max assist, +2 for physical assistance Sit to supine: Max assist, +2 for physical assistance   General bed mobility comments: verbal cues for technique. assistance for LE and trunk support. verbal cues for technique    Transfers                   General transfer comment: not attempted due to pain, limited activity tolerance    Ambulation/Gait                  Stairs            Wheelchair Mobility     Tilt Bed    Modified Rankin (Stroke Patients Only)       Balance Overall balance assessment: Needs assistance Sitting-balance support: Feet unsupported, Bilateral upper extremity supported Sitting balance-Leahy Scale: Fair                                       Pertinent Vitals/Pain Pain Assessment Pain Assessment: 0-10 Pain Score: 8  Pain Location: L knee Pain Descriptors / Indicators: Discomfort    Home Living Family/patient expects to be discharged to:: Private residence (Independent Living) Living Arrangements: Alone Available Help at Discharge: Family;Personal care attendant;Available PRN/intermittently  Prior Function Prior Level of Function : Needs assist;History of Falls (last six months)             Mobility Comments: Mod I with mobility around the apartment. uses a rolling walker for short distance ambulation. needs assistance for getting in and out of the car ADLs Comments: meals are provided by the facility. assistance for lower body dressing and bathing     Extremity/Trunk Assessment    Upper Extremity Assessment Upper Extremity Assessment: Generalized weakness    Lower Extremity Assessment Lower Extremity Assessment: Generalized weakness;LLE deficits/detail LLE Deficits / Details: hinged knee brace in place. pain with any movement of the LLE. unable to formally MMT       Communication   Communication Communication: No apparent difficulties Factors Affecting Communication: Hearing impaired    Cognition Arousal: Alert Behavior During Therapy: WFL for tasks assessed/performed   PT - Cognitive impairments: History of cognitive impairments                       PT - Cognition Comments: patient defers to her son-in-law to answer most questions Following commands: Impaired Following commands impaired: Follows one step commands with increased time     Cueing Cueing Techniques: Verbal cues     General Comments      Exercises     Assessment/Plan    PT Assessment Patient needs continued PT services  PT Problem List Decreased strength;Decreased range of motion;Decreased activity tolerance;Decreased balance;Decreased mobility;Decreased cognition;Decreased safety awareness;Pain       PT Treatment Interventions DME instruction;Gait training;Functional mobility training;Therapeutic activities;Therapeutic exercise;Balance training;Neuromuscular re-education;Cognitive remediation;Patient/family education;Wheelchair mobility training    PT Goals (Current goals can be found in the Care Plan section)  Acute Rehab PT Goals Patient Stated Goal: family goal to get to rehab possibly at [facility] PT Goal Formulation: With family Time For Goal Achievement: 05/17/23 Potential to Achieve Goals: Fair Additional Goals Additional Goal #1: patient will propel wheelchair 174ft with CGA in preparation for mobility in her apartment    Frequency Min 2X/week     Co-evaluation PT/OT/SLP Co-Evaluation/Treatment: Yes Reason for Co-Treatment: Complexity of the patient's  impairments (multi-system involvement);To address functional/ADL transfers PT goals addressed during session: Mobility/safety with mobility OT goals addressed during session: ADL's and self-care       AM-PAC PT "6 Clicks" Mobility  Outcome Measure Help needed turning from your back to your side while in a flat bed without using bedrails?: A Lot Help needed moving from lying on your back to sitting on the side of a flat bed without using bedrails?: Total Help needed moving to and from a bed to a chair (including a wheelchair)?: Total Help needed standing up from a chair using your arms (e.g., wheelchair or bedside chair)?: Total Help needed to walk in hospital room?: Total Help needed climbing 3-5 steps with a railing? : Total 6 Click Score: 7    End of Session   Activity Tolerance: Patient limited by pain Patient left: in bed;with call bell/phone within reach;with family/visitor present Nurse Communication: Mobility status PT Visit Diagnosis: Difficulty in walking, not elsewhere classified (R26.2);Muscle weakness (generalized) (M62.81)    Time: 6045-4098 PT Time Calculation (min) (ACUTE ONLY): 22 min   Charges:   PT Evaluation $PT Eval Moderate Complexity: 1 Mod   PT General Charges $$ ACUTE PT VISIT: 1 Visit         Donna Bernard, PT, MPT   Ina Homes 05/03/2023, 1:33 PM

## 2023-05-03 NOTE — ED Provider Notes (Signed)
 [facility] Provider Note    Event Date/Time   First MD Initiated Contact with Patient 05/03/23 [phone]     (approximate)   History   Fall   HPI  Terri Braun is a 89 y.o. female with history of coronary artery disease status post stent on Plavix, hypertension, hypothyroidism, COPD, anemia who presents to the emergency department with a left leg injury.  Patient states she was in her normal state of health and was sitting on her walker and trying to get to her lift chair when she fell forward landing on both of her knees.  Complaining of pain mostly to the left knee.  Did not hit her head or lose consciousness.  Denies any neck pain, back pain, chest or abdominal pain.  Has been n.p.o. since 8 PM last night.  Last took her Plavix yesterday morning at 9 AM.  Patient reports she uses a walker or wheelchair at baseline due to chronic gait instability.    History provided by patient, family.    Past Medical History:  Diagnosis Date   Anemia    ASCVD (arteriosclerotic cardiovascular disease)    COPD (chronic obstructive pulmonary disease) (HCC)    Diverticulosis    GERD (gastroesophageal reflux disease)    Hypertension    Hypothyroidism     Past Surgical History:  Procedure Laterality Date   APPENDECTOMY     BREAST SURGERY  1983   Lumpectomy   CHOLECYSTECTOMY     COLON SURGERY     COLONOSCOPY  11/04/2015   CORONARY ANGIOPLASTY WITH STENT PLACEMENT     NISSEN FUNDOPLICATION     TOTAL ABDOMINAL HYSTERECTOMY     With BSO    MEDICATIONS:  Prior to Admission medications   Medication Sig Start Date End Date Taking? Authorizing Provider  acetaminophen (TYLENOL) 500 MG tablet Take 500 mg by mouth every 4 (four) hours as needed. Patient not taking: Reported on 02/09/2023    [provider]  albuterol (VENTOLIN HFA) 108 (90 Base) MCG/ACT inhaler TAKE 2 PUFFS BY MOUTH EVERY 6 HOURS AS NEEDED FOR WHEEZE OR SHORTNESS OF BREATH 04/27/22   Salena Saner, MD  alendronate (FOSAMAX) 70 MG tablet alendronate 70 mg tablet  TAKE 1 TABLET BY MOUTH ONCE WEEKLY    [provider]  celecoxib (CELEBREX) 100 MG capsule TAKE 1 CAPSULE BY MOUTH EVERY DAY 10/08/20   Bosie Clos, MD  clopidogrel (PLAVIX) 75 MG tablet TAKE 1 TABLET BY MOUTH EVERY DAY 06/21/21   Bosie Clos, MD  conjugated estrogens (PREMARIN) vaginal cream Apply one dab to Urethra daily at bedtime Patient not taking: Reported on 02/09/2023 03/25/21   Bosie Clos, MD  diphenhydramine-acetaminophen (TYLENOL PM) 25-500 MG TABS tablet Take 1 tablet by mouth at bedtime as needed.    [provider]  DULoxetine (CYMBALTA) 60 MG capsule Take 60 mg by mouth daily.    [provider]  isosorbide mononitrate (IMDUR) 30 MG 24 hr tablet Take 1 tablet (30 mg total) by mouth daily. 04/26/22   Ostwalt, Edmon Crape, PA-C  losartan (COZAAR) 50 MG tablet Take 1 tablet by mouth daily.    [provider]  nitroGLYCERIN (NITROSTAT) 0.4 MG SL tablet Place 1 tablet (0.4 mg total) under the tongue every 5 (five) minutes as needed for chest pain. 11/11/22   Furth, Cadence H, PA-C  omeprazole (PRILOSEC) 20 MG capsule Take 20 mg by mouth daily. PRN Patient not taking: Reported on 02/09/2023 06/08/22 06/08/23  [provider]  oxyCODONE (ROXICODONE) 5 MG immediate release tablet Take 1 tablet (5 mg total) by mouth every 6 (six) hours as needed for severe pain. Patient not taking: Reported on 02/09/2023 04/24/22   Phineas Semen, MD  pantoprazole (PROTONIX) 20 MG tablet Take 20 mg by mouth daily.    [provider]  rosuvastatin (CRESTOR) 10 MG tablet TAKE 1 TABLET BY MOUTH EVERY DAY 12/19/22   Antonieta Iba, MD  TRELEGY ELLIPTA 100-62.5-25 MCG/ACT AEPB TAKE 1 PUFF BY MOUTH EVERY DAY 01/30/23   Salena Saner, MD    Physical Exam   Triage Vital Signs: ED Triage Vitals [05/02/23 2254]  Encounter Vitals Group     BP (!) 146/52     Systolic BP  Percentile      Diastolic BP Percentile      Pulse Rate 74     Resp 18     Temp 97.7 F (36.5 C)     Temp Source Oral     SpO2 99 %     Weight 140 lb (63.5 kg)     Height      Head Circumference      Peak Flow      Pain Score 10     Pain Loc      Pain Education      Exclude from Growth Chart     Most recent vital signs: Vitals:   05/03/23 0214 05/03/23 0500  BP: (!) 161/60 (!) 186/67  Pulse: 79 81  Resp: 20 18  Temp: 98 F (36.7 C)   SpO2: 99% 95%     CONSTITUTIONAL: Alert, responds appropriately to questions.  Elderly, appears uncomfortable HEAD: Normocephalic; atraumatic EYES: Conjunctivae clear, PERRL, EOMI ENT: normal nose; no rhinorrhea; moist mucous membranes; pharynx without lesions noted; no dental injury; no septal hematoma, no epistaxis; no facial deformity or bony tenderness NECK: Supple, no midline spinal tenderness, step-off or deformity; trachea midline CARD: RRR; S1 and S2 appreciated; no murmurs, no clicks, no rubs, no gallops RESP: Normal chest excursion without splinting or tachypnea; breath sounds clear and equal bilaterally; no wheezes, no rhonchi, no rales; no hypoxia or respiratory distress CHEST:  chest wall stable, no crepitus or ecchymosis or deformity, nontender to palpation; no flail chest ABD/GI: Non-distended; soft, non-tender, no rebound, no guarding; no ecchymosis or other lesions noted PELVIS:  stable, nontender to palpation BACK:  The back appears normal; no midline spinal tenderness, step-off or deformity EXT: Patient is tender to palpation over the proximal tibia and fibula on the left side.  There is a left-sided knee effusion and soft tissue swelling and bruising.  Compartments however in the left leg are soft and she has normal sensation diffusely, 2+ left DP pulse and normal capillary refill.  No other extremity tenderness or bony abnormality seen. SKIN: Normal color for age and race; warm NEURO: No facial asymmetry, normal speech,  moving all extremities equally  ED Results / Procedures / Treatments   LABS: (all labs ordered are listed, but only abnormal results are displayed) Labs Reviewed  CBC WITH DIFFERENTIAL/PLATELET - Abnormal; Notable for the following components:      Result Value   RBC 3.00 (*)    Hemoglobin 9.0 (*)    HCT 28.3 (*)    All other components within normal limits  BASIC METABOLIC PANEL WITH GFR - Abnormal; Notable for the following components:   Glucose, Bld 120 (*)    BUN 28 (*)    Calcium 8.8 (*)  GFR, Estimated 54 (*)    All other components within normal limits  PROTIME-INR     EKG:   RADIOLOGY: My personal review and interpretation of imaging: Left tibial plateau and proximal fibular fracture.  I have personally reviewed all radiology reports. CT Knee Left Wo Contrast Result Date: 05/03/2023 CLINICAL DATA:  Fall, left knee injury, tibial plateau fracture EXAM: CT OF THE LEFT KNEE WITHOUT CONTRAST TECHNIQUE: Multidetector CT imaging of the left knee was performed according to the standard protocol. Multiplanar CT image reconstructions were also generated. RADIATION DOSE REDUCTION: This exam was performed according to the departmental dose-optimization program which includes automated exposure control, adjustment of the mA and/or kV according to patient size and/or use of iterative reconstruction technique. COMPARISON:  None Available. FINDINGS: Bones/Joint/Cartilage Severe tricompartmental degenerate arthritis the left knee is noted with lateral subluxation the tibia and remodeling medial tibial plateau likely representing a combination degenerative change and possible injury. Multiple intra-articular loose bodies are identified within the medial gutter and within the posterior recess. Posterior loose body likely represents an ununited remote posterior medial tibial plateau fracture fragment. There is resultant severe varus angulation the left knee by approximately 30-35%. There is an  acute fracture of the proximal tibial diaphysis with minimal displacement. The fracture does appear complete with both transverse and longitudinal fracture planes, however, fracture fragments are in anatomic alignment. There is no intra-articular component of the fracture. There is, additionally, acute, nondisplaced fracture of the proximal fibular metaphyseal region best seen on sagittal image # 83/7 with fracture fragments in anatomic alignment. Ligaments Suboptimally assessed by CT. Muscles and Tendons Moderate fatty atrophy of the proximal left lower extremity musculature. Extensor mechanism intact. Soft tissues Large left knee effusion is present. Subcutaneous infiltration is seen within the proximal left lower extremity adjacent to the fracture plane. IMPRESSION: 1. Acute, complete, minimally displaced fracture of the proximal tibial diaphysis. 2. Acute, nondisplaced fracture of the proximal fibular metaphyseal region. 3. Severe tricompartmental degenerate arthritis of the left knee with lateral subluxation the tibia and remodeling of the medial tibial plateau likely representing a combination of degenerative change and possible injury. Multiple intra-articular loose bodies. 4. Large left knee effusion. Electronically Signed   By: Helyn Numbers M.D.   On: 05/03/2023 00:28   DG Knee Complete 4 Views Left Result Date: 05/02/2023 CLINICAL DATA:  knee pain EXAM: LEFT KNEE - COMPLETE 4+ VIEW COMPARISON:  None Available. FINDINGS: Acute nondisplaced metadiaphysis tibial fracture. Acute nondisplaced fibular neck fracture. Severe tricompartmental degenerative changes of the knee. No aggressive appearing focal bone abnormality. Subcutaneus soft tissue edema. IMPRESSION: 1. Acute nondisplaced metadiaphysis tibial fracture. 2. Acute nondisplaced fibular neck fracture. 3. Severe tricompartmental degenerative changes of the knee. Electronically Signed   By: Tish Frederickson M.D.   On: 05/02/2023 23:27      PROCEDURES:  Critical Care performed: No      Procedures    IMPRESSION / MDM / ASSESSMENT AND PLAN / ED COURSE  I reviewed the triage vital signs and the nursing notes.  Patient here with left knee injury.     DIFFERENTIAL DIAGNOSIS (includes but not limited to):   Fracture, dislocation, joint effusion, contusion  Patient's presentation is most consistent with acute presentation with potential threat to life or bodily function.  PLAN: Imaging obtained from triage reviewed and interpreted by myself and the radiologist and shows a minimally displaced left tibial plateau fracture and a nondisplaced proximal fibular fracture.  Fall seems to be mechanical in nature.  No  other injury.  No signs of compartment syndrome on exam currently.  Neurovascular intact distally.  Will give pain medication.  Hemoglobin 9.0 which is her baseline, normal creatinine and electrolytes.  Will discuss with orthopedics on-call.   MEDICATIONS GIVEN IN ED: Medications  morphine (PF) 4 MG/ML injection 4 mg (4 mg Intravenous Given 05/03/23 0358)  ondansetron (ZOFRAN) injection 4 mg (4 mg Intravenous Given 05/03/23 0358)     ED COURSE: Discussed with Dr. Joice Lofts on-call for orthopedics.  Appreciate his help.  He has reviewed her imaging.  Patient does not need surgical intervention at this time.  He recommends placing her in a hinged knee brace.  If pain has been well-controlled, patient can be discharged with close outpatient orthopedic follow-up.   Discussed this plan of care with patient, husband, daughter at bedside.  They are concerned that patient lives at home alone and feel that she will need to be placed into a rehab facility for more constant care given she will now be a mobile.  She states she is not able to use crutches because she does not have good strength in her upper extremities.  Will discuss with hospitalist for admission for pain control, physical therapy and placement into a rehab  facility.   CONSULTS:  Consulted and discussed patient's case with hospitalist, Dr. Para March.  I have recommended admission and consulting physician agrees and will place admission orders.  Patient (and family if present) agree with this plan.   I reviewed all nursing notes, vitals, pertinent previous records.  All labs, EKGs, imaging ordered have been independently reviewed and interpreted by myself.    OUTSIDE RECORDS REVIEWED: Reviewed last cardiology note on 11/11/2022.       FINAL CLINICAL IMPRESSION(S) / ED DIAGNOSES   Final diagnoses:  Closed fracture of left tibial plateau, initial encounter  Other closed fracture of proximal end of left fibula, initial encounter     Rx / DC Orders   ED Discharge Orders     None        Note:  This document was prepared using Dragon voice recognition software and may include unintentional dictation errors.   Takoda Janowiak, Layla Maw, DO 05/03/23 [phone]

## 2023-05-03 NOTE — ED Notes (Signed)
 Patient noted to have a hinged knee brace to left knee. Obtained knee immobilizer and applied to left knee. Patient tolerated very well. Swelling to left proximal tib/fib area, +CSM before and after application. Pillow readjusted under left leg. Improved pain after immobilizer application.

## 2023-05-03 NOTE — TOC Initial Note (Signed)
 Transition of Care [facility]) - Initial/Assessment Note    Patient Details  Name: Terri Braun MRN: [id number] Date of Birth: [birth year]  Transition of Care [facility]) CM/SW Contact:    Elberta Fortis, RN Phone Number: 05/03/2023, 4:10 PM  Clinical Narrative:                 Met with patient and [name] in the hospital room. Pt has fallen 3x in the past week, and has had to come to the hospital 2x in the past week because of falling. She lives in an ILF and family has been working towards ALF as she's declining. She has a caregiver daily for 4 hours and they assist her with ADL's, etc. Pt now has a FX to her right knee and is non weight bearing until healed. She's open to going to a SNF or rehab and would like to go to [facility] or [facility]. TOC to continue to follow and find her a SNF or rehab.   Expected Discharge Plan: Skilled Nursing Facility Barriers to Discharge: Continued Medical Work up   Patient Goals and CMS Choice Patient states their goals for this hospitalization and ongoing recovery are:: "To stop falling"          Expected Discharge Plan and Services   Discharge Planning Services: CM Consult   Living arrangements for the past 2 months: Independent Living Facility                                      Prior Living Arrangements/Services Living arrangements for the past 2 months: Independent Living Facility Lives with:: Self Patient language and need for interpreter reviewed:: Yes Do you feel safe going back to the place where you live?: No   "Not until I get stronger"  Need for Family Participation in Patient Care: Yes (Comment) Care giver support system in place?: Yes (comment) Current home services: Homehealth aide (Has a hired caregiver daily through [facility]) Criminal Activity/Legal Involvement Pertinent to Current Situation/Hospitalization: No - Comment as needed  Activities of Daily Living   ADL Screening (condition at  time of admission) Independently performs ADLs?: No Does the patient have a NEW difficulty with bathing/dressing/toileting/self-feeding that is expected to last >3 days?: Yes (Initiates electronic notice to provider for possible OT consult) Does the patient have a NEW difficulty with getting in/out of bed, walking, or climbing stairs that is expected to last >3 days?: Yes (Initiates electronic notice to provider for possible PT consult) Does the patient have a NEW difficulty with communication that is expected to last >3 days?: No Is the patient deaf or have difficulty hearing?: No Does the patient have difficulty seeing, even when wearing glasses/contacts?: No Does the patient have difficulty concentrating, remembering, or making decisions?: No  Permission Sought/Granted                  Emotional Assessment Appearance:: Appears stated age, Well-Groomed Attitude/Demeanor/Rapport: Gracious Affect (typically observed): Accepting, Appropriate, Quiet, Pleasant Orientation: : Oriented to Self, Oriented to Place, Oriented to Situation Alcohol / Substance Use: Never Used Psych Involvement: No (comment)  Admission diagnosis:  Tibial plateau fracture [V76.160V] Tibial fracture [S82.209A] Patient Active Problem List   Diagnosis Date Noted   Tibial plateau fracture 05/03/2023   Tibial fracture 05/03/2023   Moderate persistent asthma without complication 03/10/2022   Primary hypertension 02/17/2022   Olecranon bursitis  of right elbow 08/23/2021   Leg hematoma, left, initial encounter 05/03/2021   Syncope 05/03/2021   Osteoarthritis of left knee 04/12/2021   Leukocytes in urine 12/13/2019   Urinary frequency 12/13/2019   Closed fracture of metatarsal bone 12/25/2018   Polyp of ascending colon 02/16/2018   Diverticula, colon 02/16/2018   Primary hypothyroidism 02/16/2018   Essential hypertension, benign 02/16/2018   ASCVD (arteriosclerotic cardiovascular disease) 02/16/2018    Periumbilical abdominal pain 02/16/2018   GI bleed 02/16/2018   Iron deficiency anemia, unspecified 02/16/2018   Chronic liver disease 02/16/2018   Carrier of alpha-1-antitrypsin deficiency 02/16/2018   History of colonic polyps 02/16/2018   Abnormal liver CT 02/16/2018   Elevated liver enzymes 02/16/2018   History of hepatitis C 02/16/2018   Abnormal findings on imaging test 02/16/2018   Helicobacter pylori gastritis 02/16/2018   Gait instability 02/13/2018   Mixed hyperlipidemia 02/13/2018   Atherosclerosis of native coronary artery of native heart with stable angina pectoris (HCC) 02/12/2018   Centrilobular emphysema (HCC) 02/12/2018   Anemia 02/12/2018   Benign neoplasm of cecum 01/16/2018   Heterozygous alpha 1-antitrypsin deficiency (HCC) 01/16/2018   Gastroesophageal reflux disease 01/16/2018   Bronchiectasis without complication (HCC) 05/16/2016   Moderate episode of recurrent major depressive disorder (HCC) 04/14/2016   Bloody stool 09/15/2015   Left lower quadrant pain 09/15/2015   Long term (current) use of antithrombotics/antiplatelets 09/15/2015   Dyspnea 05/18/2015   Anxiety 11/14/2011   DJD (degenerative joint disease) 11/14/2011   Abnormal stress test 04/20/2010   Dyslipidemia 04/20/2010   Status post coronary artery stent placement 04/20/2010   CAD (coronary artery disease) 04/20/2010   PCP:  Bosie Clos, MD Pharmacy:   [facility] [phone] [address], [place] - [address] [place] [address] Phone: [phone] Fax: [phone]     Social Drivers of Health (SDOH) Social History: SDOH Screenings   Food Insecurity: No Food Insecurity (05/03/2023)  Housing: Low Risk  (05/03/2023)  Transportation Needs: No Transportation Needs (05/03/2023)  Utilities: Not At Risk (05/03/2023)  Alcohol Screen: Low Risk  (09/29/2021)  Depression (PHQ2-9): Low Risk  (09/29/2021)  Financial Resource Strain: Low Risk  (09/29/2021)  Physical Activity:  Insufficiently Active (09/29/2021)  Social Connections: Moderately Isolated (05/03/2023)  Stress: No Stress Concern Present (09/29/2021)  Tobacco Use: Low Risk  (05/03/2023)   SDOH Interventions:     Readmission Risk Interventions     No data to display

## 2023-05-03 NOTE — ED Notes (Signed)
 Pt reports pain level 8/10 due to fracture. Pt and family informed that pain meds are not available until after 2200, pt NAD at this time, verbalized understanding

## 2023-05-04 DIAGNOSIS — Z9181 History of falling: Secondary | ICD-10-CM | POA: Diagnosis not present

## 2023-05-04 DIAGNOSIS — I10 Essential (primary) hypertension: Secondary | ICD-10-CM | POA: Diagnosis present

## 2023-05-04 DIAGNOSIS — E039 Hypothyroidism, unspecified: Secondary | ICD-10-CM | POA: Diagnosis present

## 2023-05-04 DIAGNOSIS — K219 Gastro-esophageal reflux disease without esophagitis: Secondary | ICD-10-CM

## 2023-05-04 DIAGNOSIS — R54 Age-related physical debility: Secondary | ICD-10-CM | POA: Diagnosis present

## 2023-05-04 DIAGNOSIS — Z7902 Long term (current) use of antithrombotics/antiplatelets: Secondary | ICD-10-CM | POA: Diagnosis not present

## 2023-05-04 DIAGNOSIS — W07XXXA Fall from chair, initial encounter: Secondary | ICD-10-CM | POA: Diagnosis present

## 2023-05-04 DIAGNOSIS — Z803 Family history of malignant neoplasm of breast: Secondary | ICD-10-CM | POA: Diagnosis not present

## 2023-05-04 DIAGNOSIS — Z79899 Other long term (current) drug therapy: Secondary | ICD-10-CM | POA: Diagnosis not present

## 2023-05-04 DIAGNOSIS — Z602 Problems related to living alone: Secondary | ICD-10-CM | POA: Diagnosis present

## 2023-05-04 DIAGNOSIS — S82142A Displaced bicondylar fracture of left tibia, initial encounter for closed fracture: Secondary | ICD-10-CM | POA: Diagnosis present

## 2023-05-04 DIAGNOSIS — N3000 Acute cystitis without hematuria: Secondary | ICD-10-CM | POA: Diagnosis not present

## 2023-05-04 DIAGNOSIS — Z8249 Family history of ischemic heart disease and other diseases of the circulatory system: Secondary | ICD-10-CM | POA: Diagnosis not present

## 2023-05-04 DIAGNOSIS — I2510 Atherosclerotic heart disease of native coronary artery without angina pectoris: Secondary | ICD-10-CM | POA: Diagnosis present

## 2023-05-04 DIAGNOSIS — J449 Chronic obstructive pulmonary disease, unspecified: Secondary | ICD-10-CM

## 2023-05-04 DIAGNOSIS — Z955 Presence of coronary angioplasty implant and graft: Secondary | ICD-10-CM | POA: Diagnosis not present

## 2023-05-04 DIAGNOSIS — R296 Repeated falls: Secondary | ICD-10-CM | POA: Diagnosis present

## 2023-05-04 DIAGNOSIS — K5900 Constipation, unspecified: Secondary | ICD-10-CM | POA: Diagnosis present

## 2023-05-04 DIAGNOSIS — Z91013 Allergy to seafood: Secondary | ICD-10-CM | POA: Diagnosis not present

## 2023-05-04 DIAGNOSIS — M1712 Unilateral primary osteoarthritis, left knee: Secondary | ICD-10-CM | POA: Diagnosis present

## 2023-05-04 DIAGNOSIS — S82832A Other fracture of upper and lower end of left fibula, initial encounter for closed fracture: Secondary | ICD-10-CM | POA: Diagnosis present

## 2023-05-04 DIAGNOSIS — Z90710 Acquired absence of both cervix and uterus: Secondary | ICD-10-CM | POA: Diagnosis not present

## 2023-05-04 DIAGNOSIS — N390 Urinary tract infection, site not specified: Secondary | ICD-10-CM

## 2023-05-04 DIAGNOSIS — E785 Hyperlipidemia, unspecified: Secondary | ICD-10-CM | POA: Diagnosis present

## 2023-05-04 DIAGNOSIS — Z7951 Long term (current) use of inhaled steroids: Secondary | ICD-10-CM | POA: Diagnosis not present

## 2023-05-04 DIAGNOSIS — Z751 Person awaiting admission to adequate facility elsewhere: Secondary | ICD-10-CM | POA: Diagnosis not present

## 2023-05-04 DIAGNOSIS — Y92129 Unspecified place in nursing home as the place of occurrence of the external cause: Secondary | ICD-10-CM | POA: Diagnosis not present

## 2023-05-04 MED ORDER — SODIUM CHLORIDE 0.9 % IV SOLN
2.0000 g | INTRAVENOUS | Status: AC
  Administered 2023-05-04 – 2023-05-09 (×6): 2 g via INTRAVENOUS
  Filled 2023-05-04 (×6): qty 20

## 2023-05-04 NOTE — Care Management Obs Status (Signed)
 MEDICARE OBSERVATION STATUS NOTIFICATION   Patient Details  Name: Terri Braun MRN: [id number] Date of Birth: 1929-02-21   Medicare Observation Status Notification Given:  Orland Dec, CMA 05/04/2023, 9:40 AM

## 2023-05-04 NOTE — Hospital Course (Addendum)
 Taken from H&P.   Terri Braun is a 89 y.o. female with medical history significant of CAD status post stenting on Plavix, HTN, hypothyroidism, COPD, severe left knee OA, presented with recurrent fall and fracture left knee.   Patient lives alone in an independent living facility and is currently being worked up moving to ALF due to frequent falls.  On presentation hemodynamically stable,CT left knee showed acute complete minimally displaced fracture of proximal tibial diaphysis and acute nondisplaced fracture of proximal fibula metaphyseal region, severe OA left knee.   Orthopedic surgery was consulted and they were advising conservative management with hinged knee brace and outpatient follow-up.  4/3: Blood pressure elevated at 170/54, UA yesterday with moderate leukocytes and rare bacteria.  Rest of the labs stable.  Ordered urine culture and starting her on ceftriaxone.  Patient was having increased urinary frequency, progressive weakness and multiple recent falls.  4/4: Blood pressure mildly elevated.  Urine cultures with multiple species, based on her symptoms we decided to treat for 5 days.  Continuing ceftriaxone while in the hospital.  She was placed on hinged knee brace, locked in extension.  She will be nonweightbearing on left lower extremity for 6 to 8 weeks and follow-up with orthopedic surgery as outpatient. TOC is looking for placement.  4/5: Medically stable, awaiting SNF.  4/6: Added MiraLAX as still no bowel movement.  Otherwise stable for SNF  4/7: Hemodynamically stable.  Had a BM-awaiting placement.  4/8: Remains stable, awaiting SNF placement.  Completed the course of antibiotic.  4/9: Hemodynamically stable.  Had a bed offer-pending insurance authorization

## 2023-05-04 NOTE — Assessment & Plan Note (Signed)
 Left proximal fibula metaphyseal fracture, nondisplaced . Orthopedic surgery reviewed her case and recommended conservative management with a hinged left knee brace, pain management and PT. PT is recommending SNF -Continue with pain management -TOC to work on SNF placement

## 2023-05-04 NOTE — ED Notes (Signed)
 Left leg elevated on pillows to attempt to reduce swelling. Patient complaining of pain from above knee radiating down. Strong pedal pulses and warm skin.

## 2023-05-04 NOTE — Assessment & Plan Note (Signed)
 No chest pain.  Troponin negative -Continue home Imdur, Plavix and Crestor

## 2023-05-04 NOTE — TOC Progression Note (Signed)
 Transition of Care [facility]) - Progression Note    Patient Details  Name: Terri Braun MRN: [id number] Date of Birth: [birth year]  [facility]) CM/SW Contact  Elberta Fortis, RN Phone Number: 05/04/2023, 12:29 PM  Clinical Narrative:    Valentina Shaggy at [facility] to ask if they have a SNF bed open and had to leave a message requesting a return call.    Expected Discharge Plan: Skilled Nursing Facility Barriers to Discharge: Continued Medical Work up  Expected Discharge Plan and Services   Discharge Planning Services: CM Consult   Living arrangements for the past 2 months: Independent Living Facility                                       Social Determinants of Health (SDOH) Interventions SDOH Screenings   Food Insecurity: No Food Insecurity (05/03/2023)  Housing: Low Risk  (05/03/2023)  Transportation Needs: No Transportation Needs (05/03/2023)  Utilities: Not At Risk (05/03/2023)  Alcohol Screen: Low Risk  (09/29/2021)  Depression (PHQ2-9): Low Risk  (09/29/2021)  Financial Resource Strain: Low Risk  (09/29/2021)  Physical Activity: Insufficiently Active (09/29/2021)  Social Connections: Moderately Isolated (05/03/2023)  Stress: No Stress Concern Present (09/29/2021)  Tobacco Use: Low Risk  (05/03/2023)    Readmission Risk Interventions     No data to display

## 2023-05-04 NOTE — Consult Note (Signed)
 ORTHOPAEDIC CONSULTATION  REQUESTING PHYSICIAN: Arnetha Courser, MD  Chief Complaint:   Left knee/leg pain.  History of Present Illness: Terri Braun is a 89 y.o. female with a history of hypertension, hypothyroidism, coronary artery disease, COPD, and gastroesophageal reflux disease who normally lives at an assisted living facility.  The patient normally ambulates with a walker or uses a wheelchair to get around due to her advanced degenerative joint disease and resultant chronic gait instability.  Apparently she was sitting on her walker while trying to get to her lift chair when she fell forward landing on both knees.  She was brought to the emergency room where x-rays demonstrated evidence of a nondisplaced proximal tibial metadiaphyseal fracture with nondisplaced proximal fibular fracture superimposed upon a knee with severe degenerative joint disease.  The patient subsequently has been admitted for pain control and further management of her injury.  The patient denies any associated injuries resulting from the fall.  She did not strike her head or lose consciousness.  The patient also denies any lightheadedness, dizziness, chest pain, shortness of breath, or other symptoms which may have contributed to her fall.  Past Medical History:  Diagnosis Date   Anemia    ASCVD (arteriosclerotic cardiovascular disease)    COPD (chronic obstructive pulmonary disease) (HCC)    Diverticulosis    GERD (gastroesophageal reflux disease)    Hypertension    Hypothyroidism    Past Surgical History:  Procedure Laterality Date   APPENDECTOMY     BREAST SURGERY  1983   Lumpectomy   CHOLECYSTECTOMY     COLON SURGERY     COLONOSCOPY  11/04/2015   CORONARY ANGIOPLASTY WITH STENT PLACEMENT     NISSEN FUNDOPLICATION     TOTAL ABDOMINAL HYSTERECTOMY     With BSO   Social History   Socioeconomic History   Marital status: Widowed    Spouse  name: Not on file   Number of children: Not on file   Years of education: Not on file   Highest education level: Not on file  Occupational History   Not on file  Tobacco Use   Smoking status: Never   Smokeless tobacco: Never  Vaping Use   Vaping status: Never Used  Substance and Sexual Activity   Alcohol use: Never   Drug use: Never   Sexual activity: Not on file  Other Topics Concern   Not on file  Social History Narrative   She is widowed she has sons and daughters, she moved here from [place] to live in [place] near her daughter   2 caffeinated beverages a day   Rare alcohol   Never smoker   Social Drivers of Corporate investment banker Strain: Low Risk  (09/29/2021)   Overall Financial Resource Strain (CARDIA)    Difficulty of Paying Living Expenses: Not very hard  Food Insecurity: No Food Insecurity (05/03/2023)   Hunger Vital Sign    Worried About Running Out of Food in the Last Year: Never true    Ran Out of Food in the Last Year: Never true  Transportation Needs: No Transportation Needs (05/03/2023)   PRAPARE - Administrator, Civil Service (Medical): No    Lack of Transportation (Non-Medical): No  Physical Activity: Insufficiently Active (09/29/2021)   Exercise Vital Sign    Days of Exercise per Week: 2 days    Minutes of Exercise per Session: 60 min  Stress: No Stress Concern Present (09/29/2021)   [employer] of Occupational  Health - Occupational Stress Questionnaire    Feeling of Stress : Only a little  Social Connections: Moderately Isolated (05/03/2023)   Social Connection and Isolation Panel [NHANES]    Frequency of Communication with Friends and Family: More than three times a week    Frequency of Social Gatherings with Friends and Family: More than three times a week    Attends Religious Services: More than 4 times per year    Active Member of [employer] or Organizations: No    Attends Banker Meetings: Never     Marital Status: Widowed   Family History  Problem Relation Age of Onset   Coronary artery disease Mother    Heart disease Mother    Crohn's disease Son    Cancer Other        other family member with breast cancer   Allergies  Allergen Reactions   Shellfish Allergy Anaphylaxis    Ended up in the ED after eating shellfish, caused diarrhea and severe GI upset , unsure if caused SOB   Shellfish-Derived Products     Other reaction(s): Not available   Sulfa Antibiotics    Prior to Admission medications   Medication Sig Start Date End Date Taking? Authorizing Provider  albuterol (VENTOLIN HFA) 108 (90 Base) MCG/ACT inhaler TAKE 2 PUFFS BY MOUTH EVERY 6 HOURS AS NEEDED FOR WHEEZE OR SHORTNESS OF BREATH 04/27/22  Yes Salena Saner, MD  celecoxib (CELEBREX) 100 MG capsule TAKE 1 CAPSULE BY MOUTH EVERY DAY Patient taking differently: Take 100 mg by mouth daily. TAKE 1 CAPSULE BY MOUTH EVERY DAY 10/08/20  Yes Bosie Clos, MD  clopidogrel (PLAVIX) 75 MG tablet TAKE 1 TABLET BY MOUTH EVERY DAY Patient taking differently: Take 75 mg by mouth daily. 06/21/21  Yes Bosie Clos, MD  diphenhydramine-acetaminophen (TYLENOL PM) 25-500 MG TABS tablet Take 1 tablet by mouth at bedtime as needed.   Yes [provider]  DULoxetine (CYMBALTA) 60 MG capsule Take 60 mg by mouth daily.   Yes [provider]  isosorbide mononitrate (IMDUR) 30 MG 24 hr tablet Take 1 tablet (30 mg total) by mouth daily. 04/26/22  Yes Ostwalt, Janna, PA-C  lidocaine (LIDODERM) 5 % Place 1 patch onto the skin daily. 04/26/23  Yes [provider]  losartan (COZAAR) 50 MG tablet Take 50 mg by mouth daily.   Yes [provider]  nitroGLYCERIN (NITROSTAT) 0.4 MG SL tablet Place 1 tablet (0.4 mg total) under the tongue every 5 (five) minutes as needed for chest pain. 11/11/22  Yes Furth, Cadence H, PA-C  pantoprazole (PROTONIX) 20 MG tablet Take 20 mg by mouth daily.   Yes [provider]  rosuvastatin (CRESTOR) 10 MG tablet TAKE 1 TABLET BY MOUTH EVERY DAY 12/19/22  Yes Gollan, Tollie Pizza, MD  TRELEGY ELLIPTA 100-62.5-25 MCG/ACT AEPB TAKE 1 PUFF BY MOUTH EVERY DAY 01/30/23  Yes Salena Saner, MD  acetaminophen (TYLENOL) 500 MG tablet Take 500 mg by mouth every 4 (four) hours as needed. Patient not taking: Reported on 11/11/2022    [provider]  alendronate (FOSAMAX) 70 MG tablet alendronate 70 mg tablet  TAKE 1 TABLET BY MOUTH ONCE WEEKLY    [provider]  conjugated estrogens (PREMARIN) vaginal cream Apply one dab to Urethra daily at bedtime Patient not taking: Reported on 11/11/2022 03/25/21   Bosie Clos, MD  omeprazole (PRILOSEC) 20 MG capsule Take 20 mg by mouth daily. PRN Patient not taking: Reported on  11/11/2022 06/08/22 06/08/23  [provider]  oxyCODONE (ROXICODONE) 5 MG immediate release tablet Take 1 tablet (5 mg total) by mouth every 6 (six) hours as needed for severe pain. Patient not taking: Reported on 06/09/2022 04/24/22   Phineas Semen, MD   CT Knee Left Wo Contrast Result Date: 05/03/2023 CLINICAL DATA:  Fall, left knee injury, tibial plateau fracture EXAM: CT OF THE LEFT KNEE WITHOUT CONTRAST TECHNIQUE: Multidetector CT imaging of the left knee was performed according to the standard protocol. Multiplanar CT image reconstructions were also generated. RADIATION DOSE REDUCTION: This exam was performed according to the departmental dose-optimization program which includes automated exposure control, adjustment of the mA and/or kV according to patient size and/or use of iterative reconstruction technique. COMPARISON:  None Available. FINDINGS: Bones/Joint/Cartilage Severe tricompartmental degenerate arthritis the left knee is noted with lateral subluxation the tibia and remodeling medial tibial plateau likely representing a combination degenerative change and possible injury. Multiple intra-articular loose bodies  are identified within the medial gutter and within the posterior recess. Posterior loose body likely represents an ununited remote posterior medial tibial plateau fracture fragment. There is resultant severe varus angulation the left knee by approximately 30-35%. There is an acute fracture of the proximal tibial diaphysis with minimal displacement. The fracture does appear complete with both transverse and longitudinal fracture planes, however, fracture fragments are in anatomic alignment. There is no intra-articular component of the fracture. There is, additionally, acute, nondisplaced fracture of the proximal fibular metaphyseal region best seen on sagittal image # 83/7 with fracture fragments in anatomic alignment. Ligaments Suboptimally assessed by CT. Muscles and Tendons Moderate fatty atrophy of the proximal left lower extremity musculature. Extensor mechanism intact. Soft tissues Large left knee effusion is present. Subcutaneous infiltration is seen within the proximal left lower extremity adjacent to the fracture plane. IMPRESSION: 1. Acute, complete, minimally displaced fracture of the proximal tibial diaphysis. 2. Acute, nondisplaced fracture of the proximal fibular metaphyseal region. 3. Severe tricompartmental degenerate arthritis of the left knee with lateral subluxation the tibia and remodeling of the medial tibial plateau likely representing a combination of degenerative change and possible injury. Multiple intra-articular loose bodies. 4. Large left knee effusion. Electronically Signed   By: Helyn Numbers M.D.   On: 05/03/2023 00:28   DG Knee Complete 4 Views Left Result Date: 05/02/2023 CLINICAL DATA:  knee pain EXAM: LEFT KNEE - COMPLETE 4+ VIEW COMPARISON:  None Available. FINDINGS: Acute nondisplaced metadiaphysis tibial fracture. Acute nondisplaced fibular neck fracture. Severe tricompartmental degenerative changes of the knee. No aggressive appearing focal bone abnormality. Subcutaneus soft  tissue edema. IMPRESSION: 1. Acute nondisplaced metadiaphysis tibial fracture. 2. Acute nondisplaced fibular neck fracture. 3. Severe tricompartmental degenerative changes of the knee. Electronically Signed   By: Tish Frederickson M.D.   On: 05/02/2023 23:27    Positive ROS: All other systems have been reviewed and were otherwise negative with the exception of those mentioned in the HPI and as above.  Physical Exam: General:  Alert, no acute distress Psychiatric:  Patient is competent for consent with normal mood and affect   Cardiovascular:  No pedal edema Respiratory:  No wheezing, non-labored breathing GI:  Abdomen is soft and non-tender Skin:  No lesions in the area of chief complaint Neurologic:  Sensation intact distally Lymphatic:  No axillary or cervical lymphadenopathy  Orthopedic Exam:  Orthopedic examination is limited to the left lower extremity.  Skin inspection is notable for moderate swelling around the knee and extending into the anterior aspect  of the right lower leg.  No erythema, ecchymosis, abrasions, or other skin abnormalities are identified.  She has a 2+ knee effusion.  There is moderate-severe tenderness to palpation around the knee as well as over the proximal tibial region.  She has more severe pain with any attempted active or passive motion of the knee.  She is grossly neurovascularly intact to the left lower extremity and foot, demonstrating the ability to dorsiflex plantarflex her toes and ankle.  Sensation is intact to light touch to all distributions.  She has good capillary refill to her toes.  X-rays:  Recent x-rays and CT scanning of the left knee demonstrate a nondisplaced metadiaphyseal fracture of the left tibia as well as a nondisplaced fracture of the proximal fibula.  In addition, there is severe advanced degenerative joint disease with lateral tibial subluxation and varus alignment.  There also is evidence of a posterior medial tibial plateau fracture of  uncertain age which may be an old nonunited fracture.  Both the films and report reviewed by myself and discussed with the patient and her son-in-law who is at the bedside.  Assessment: 1.  Nondisplaced left proximal tib-fib fracture. 2.  Severe advanced tricompartmental degenerative joint disease left knee.  Plan: The treatment options have been discussed with the patient and her son-in-law, who is at the bedside.  The patient would like to avoid surgical intervention if at all possible.  Given that the fracture is nondisplaced, I feel that is reasonable to try to treat this injury nonsurgically.  The patient is in a knee immobilizer now.  However, I feel that she would be best served with a postoperative hinged knee brace which would provide her with better overall stability.  Therefore, I will make arrangements to get one from the OR, and should be able to apply it tomorrow.  Thank you for asking me to participate in the care of this most pleasant yet unfortunate woman.  I will be happy to follow her with you.   Maryagnes Amos, MD  Beeper #:  [phone]  05/04/2023 6:02 PM

## 2023-05-04 NOTE — ED Notes (Signed)
 Pt placed on 2L [place] due to O2 sats dropping to 87% post dilaudid administration, pt NAD

## 2023-05-04 NOTE — Progress Notes (Signed)
 PT Cancellation Note  Patient Details Name: Terri Braun MRN: [id number] DOB: 1929-10-06   Cancelled Treatment:    Reason Eval/Treat Not Completed: Patient at procedure or test/unavailable.  Chart reviewed and attempted to see.  Pt off the floor moving to another unit.  Pt still does not have clear weight bearing orders and conflicting brace orders a this time.     Nolon Bussing, PT, DPT Physical Therapist - [facility]  05/04/23, 3:45 PM

## 2023-05-04 NOTE — Plan of Care (Signed)
  Problem: Health Behavior/Discharge Planning: Goal: Ability to manage health-related needs will improve 05/04/2023 1954 by Emilio Aspen, RN Outcome: Progressing 05/04/2023 1938 by Emilio Aspen, RN Outcome: Progressing   Problem: Clinical Measurements: Goal: Ability to maintain clinical measurements within normal limits will improve 05/04/2023 1954 by Emilio Aspen, RN Outcome: Progressing 05/04/2023 1938 by Emilio Aspen, RN Outcome: Progressing   Problem: Clinical Measurements: Goal: Will remain free from infection 05/04/2023 1954 by Emilio Aspen, RN Outcome: Progressing 05/04/2023 1938 by Emilio Aspen, RN Outcome: Progressing   Problem: Activity: Goal: Risk for activity intolerance will decrease 05/04/2023 1954 by Emilio Aspen, RN Outcome: Progressing 05/04/2023 1938 by Emilio Aspen, RN Outcome: Progressing   Problem: Pain Managment: Goal: General experience of comfort will improve and/or be controlled 05/04/2023 1954 by Emilio Aspen, RN Outcome: Progressing 05/04/2023 1938 by Emilio Aspen, RN Outcome: Progressing

## 2023-05-04 NOTE — Assessment & Plan Note (Signed)
 Continue PPI ?

## 2023-05-04 NOTE — Plan of Care (Signed)

## 2023-05-04 NOTE — ED Notes (Addendum)
 Leg repositioned on pillow , pt tolerated well NAD

## 2023-05-04 NOTE — Assessment & Plan Note (Signed)
 No acute concern. -Continue home Trelegy -As needed albuterol

## 2023-05-04 NOTE — Assessment & Plan Note (Signed)
 Blood pressure currently within goal. -Continue home losartan and Imdur

## 2023-05-04 NOTE — Assessment & Plan Note (Signed)
 UA concerning for UTI.  Patient was having increased urinary frequency with nocturia, progressive weakness and recent multiple falls. -Urine cultures were sent -Started on ceftriaxone

## 2023-05-04 NOTE — Progress Notes (Signed)
  Progress Note   Patient: Terri Braun HYQ:[id number] DOB: 1929-03-29 DOA: 05/03/2023     0 DOS: the patient was seen and examined on 05/04/2023   Brief hospital course: Taken from H&P.   Nawaal Alling is a 89 y.o. female with medical history significant of CAD status post stenting on Plavix, HTN, hypothyroidism, COPD, severe left knee OA, presented with recurrent fall and fracture left knee.   Patient lives alone in an independent living facility and is currently being worked up moving to ALF due to frequent falls.  On presentation hemodynamically stable,CT left knee showed acute complete minimally displaced fracture of proximal tibial diaphysis and acute nondisplaced fracture of proximal fibula metaphyseal region, severe OA left knee.   Orthopedic surgery was consulted and they were advising conservative management with hinged knee brace and outpatient follow-up.  4/3: Blood pressure elevated at 170/54, UA yesterday with moderate leukocytes and rare bacteria.  Rest of the labs stable.  Ordered urine culture and starting her on ceftriaxone.  Patient was having increased urinary frequency, progressive weakness and multiple recent falls.  Assessment and Plan: * Tibial plateau fracture Left proximal fibula metaphyseal fracture, nondisplaced . Orthopedic surgery reviewed her case and recommended conservative management with a hinged left knee brace, pain management and PT. PT is recommending SNF -Continue with pain management -TOC to work on SNF placement  UTI (urinary tract infection) UA concerning for UTI.  Patient was having increased urinary frequency with nocturia, progressive weakness and recent multiple falls. -Urine cultures were sent -Started on ceftriaxone  Essential hypertension Blood pressure currently within goal. -Continue home losartan and Imdur  COPD (chronic obstructive pulmonary disease) (HCC) No acute concern. -Continue home Trelegy -As needed albuterol  CAD  (coronary artery disease) No chest pain.  Troponin negative -Continue home Imdur, Plavix and Crestor  GERD (gastroesophageal reflux disease) -Continue PPI   Subjective: Patient was seen and examined today.  Having left leg pain which increases with any movement.  She was having increased urinary frequency and nocturia for the past few days.  Multiple recent falls and overall feeling weak.  Physical Exam: Vitals:   05/04/23 0411 05/04/23 0514 05/04/23 0829 05/04/23 1100  BP: (!) 161/75  (!) 170/54 (!) 110/42  Pulse: 92 80 73 73  Resp: 20  16 16   Temp: 98.3 F (36.8 C)  97.9 F (36.6 C)   TempSrc: Oral  Oral   SpO2: 92% 92% 100% 92%  Weight:      Height:       General.  Frail elderly lady, in no acute distress. Pulmonary.  Lungs clear bilaterally, normal respiratory effort. CV.  Regular rate and rhythm, no JVD, rub or murmur. Abdomen.  Soft, nontender, nondistended, BS positive. CNS.  Alert and oriented .  No focal neurologic deficit. Extremities.  1+ left LE edema, left knee with brace.  Multiple bilateral ecchymosis.  Data Reviewed: Prior records reviewed  Family Communication: Discussed with daughter at bedside  Disposition: Status is: Observation The patient will require care spanning > 2 midnights and should be moved to inpatient because: Severity of illness  Planned Discharge Destination: Skilled nursing facility  DVT prophylaxis.  Lovenox Time spent: 45 minutes  This record has been created using Conservation officer, historic buildings. Errors have been sought and corrected,but may not always be located. Such creation errors do not reflect on the standard of care.   Author: Arnetha Courser, MD 05/04/2023 12:30 PM  For on call review [email or web address].

## 2023-05-05 DIAGNOSIS — S82235A Nondisplaced oblique fracture of shaft of left tibia, initial encounter for closed fracture: Secondary | ICD-10-CM | POA: Insufficient documentation

## 2023-05-05 DIAGNOSIS — J449 Chronic obstructive pulmonary disease, unspecified: Secondary | ICD-10-CM | POA: Diagnosis not present

## 2023-05-05 DIAGNOSIS — N3000 Acute cystitis without hematuria: Secondary | ICD-10-CM | POA: Diagnosis not present

## 2023-05-05 DIAGNOSIS — S82832A Other fracture of upper and lower end of left fibula, initial encounter for closed fracture: Secondary | ICD-10-CM | POA: Insufficient documentation

## 2023-05-05 DIAGNOSIS — I10 Essential (primary) hypertension: Secondary | ICD-10-CM | POA: Diagnosis not present

## 2023-05-05 DIAGNOSIS — S82142A Displaced bicondylar fracture of left tibia, initial encounter for closed fracture: Secondary | ICD-10-CM | POA: Diagnosis not present

## 2023-05-05 LAB — URINE CULTURE

## 2023-05-05 MED ORDER — ENSURE ENLIVE PO LIQD
237.0000 mL | Freq: Two times a day (BID) | ORAL | Status: DC
  Administered 2023-05-06 – 2023-05-11 (×11): 237 mL via ORAL

## 2023-05-05 MED ORDER — ADULT MULTIVITAMIN W/MINERALS CH
1.0000 | ORAL_TABLET | Freq: Every day | ORAL | Status: DC
  Administered 2023-05-05 – 2023-05-11 (×7): 1 via ORAL
  Filled 2023-05-05 (×7): qty 1

## 2023-05-05 NOTE — Assessment & Plan Note (Signed)
 Blood pressure mildly elevated -Continue home losartan and Imdur

## 2023-05-05 NOTE — Progress Notes (Signed)
 Initial Nutrition Assessment  DOCUMENTATION CODES:   Not applicable  INTERVENTION:   -Obtain new wt -Ensure Enlive po BID, each supplement provides 350 kcal and 20 grams of protein.  -Liberalize diet to regular for widest variety of meal selections -MVI with minerals daily  NUTRITION DIAGNOSIS:   Increased nutrient needs related to chronic illness (COPD) as evidenced by estimated needs.  GOAL:   Patient will meet greater than or equal to 90% of their needs  MONITOR:   PO intake, Supplement acceptance  REASON FOR ASSESSMENT:   Consult Assessment of nutrition requirement/status  ASSESSMENT:   Pt with medical history significant of CAD status post stenting on Plavix, HTN, hypothyroidism, COPD, severe left knee OA, presented with recurrent fall and fracture left knee.  Pt admitted with lt knee fracture.   Reviewed I/O's: -141 ml x 24 hours  UOP: 450 ml x 24 hours  Per orthopedics notes, plan for non-operative management. Pt initially placed in a knee immobilizer, but transitioned to hinge range motion brace.   Per H&P, pt was residing at [facility], however, pt family has been working on placing pt in higher level of care due to frequent falls.   Pt previously on a heart healthy diet, but liberalized to regular. Noted meal completions 50-95%.   Reviewed wt hx; unsure if most recent wt is an actual vs stated wt due to multiple identical weight readings. RD will obtain new wt to better assess acute weight changes.   Per TOC notes, plan for SNF placement at discharge.   Medications reviewed and include lovenox and protonix.   Labs reviewed.    Diet Order:   Diet Order             Diet regular Room service appropriate? Yes; Fluid consistency: Thin  Diet effective now                   EDUCATION NEEDS:   No education needs have been identified at this time  Skin:  Skin Assessment: Reviewed RN Assessment  Last BM:  05/02/23  Height:    Ht Readings from Last 1 Encounters:  05/03/23 5\' 5"  (1.651 m)    Weight:   Wt Readings from Last 1 Encounters:  05/02/23 63.5 kg    Ideal Body Weight:  56.8 kg  BMI:  Body mass index is 23.3 kg/m.  Estimated Nutritional Needs:   Kcal:  1600-1800  Protein:  80-95 grams  Fluid:  1.6-1.8 L    Levada Schilling, RD, LDN, CDCES Registered Dietitian III Certified Diabetes Care and Education Specialist If unable to reach this RD, please use "RD Inpatient" group chat on secure chat between hours of 8am-4 pm daily

## 2023-05-05 NOTE — Progress Notes (Signed)
 Physical Therapy Treatment Patient Details Name: Terri Braun MRN: [id number] DOB: 05-25-1929 Today's Date: 05/05/2023   History of Present Illness Gearldene Fiorenza is a 89 y.o. female with medical history significant of CAD status post stenting on Plavix, HTN, hypothyroidism, COPD, severe left knee OA, presented with recurrent fall and Left proximal fibula metaphyseal fracture managed nonoperatively.    PT Comments  Pt pleasant and put forth good effort during the session.  Pt presented with some difficulty following proper sequencing during transfer training.  Pt given max multi-modal cuing for proper sequencing during attempts to stand from the recliner but with any exertion pt immediately would lean posteriorly and reach for the walker instead of pushing from the chair.  With extensive anterior weight shifting activities and then strong tactile cues to keep hands on the armrests the pt was able to come to standing but only with +2 total assist.  Pt's LLE held out anteriorly to ensure WB compliance with nurse present to remove recliner out from under the pt and then roll the bed under her to safely assist with returning to bed per pt request.  Pt unable to pivot on RLE while in standing.  Pt will benefit from continued PT services upon discharge to safely address deficits listed in patient problem list for decreased caregiver assistance and eventual return to PLOF.     If plan is discharge home, recommend the following: Two people to help with walking and/or transfers;Two people to help with bathing/dressing/bathroom;Assist for transportation;Help with stairs or ramp for entrance;Supervision due to cognitive status;Assistance with cooking/housework;Direct supervision/assist for medications management   Can travel by private vehicle     No  Equipment Recommendations  Other (comment) (TBD at next venue of care)    Recommendations for Other Services       Precautions / Restrictions  Precautions Precautions: Fall Recall of Precautions/Restrictions: Impaired Restrictions Weight Bearing Restrictions Per Provider Order: Yes LLE Weight Bearing Per Provider Order: Non weight bearing Other Position/Activity Restrictions: hinged knee brace locked in extension     Mobility  Bed Mobility Overal bed mobility: Needs Assistance Bed Mobility: Sit to Supine       Sit to supine: Max assist, +2 for physical assistance   General bed mobility comments: Max A for BLE and trunk control    Transfers Overall transfer level: Needs assistance   Transfers: Sit to/from Stand Sit to Stand: Total assist, +2 physical assistance           General transfer comment: Pt required total assist to come to standing with LLE held out anteriorly to prevent WB    Ambulation/Gait               General Gait Details: Unable   Stairs             Wheelchair Mobility     Tilt Bed    Modified Rankin (Stroke Patients Only)       Balance Overall balance assessment: Needs assistance Sitting-balance support: Feet unsupported, Bilateral upper extremity supported Sitting balance-Leahy Scale: Fair         Standing balance comment: Unable                            Communication Communication Communication: No apparent difficulties Factors Affecting Communication: Hearing impaired  Cognition Arousal: Alert Behavior During Therapy: WFL for tasks assessed/performed   PT - Cognitive impairments: History of cognitive impairments  Following commands: Impaired Following commands impaired: Follows one step commands with increased time, Follows one step commands inconsistently    Cueing Cueing Techniques: Verbal cues, Tactile cues, Visual cues  Exercises Total Joint Exercises Hip ABduction/ADduction: AAROM, Strengthening, Both, 5 reps Straight Leg Raises: AAROM, Both, Strengthening, 5 reps Long Arc Quad: AROM, AAROM,  Strengthening, Right, 5 reps Knee Flexion: AROM, AAROM, Strengthening, Right, 5 reps Other Exercises Other Exercises: Anterior weight shifting in sitting to facilitate improved transfers    General Comments        Pertinent Vitals/Pain Pain Assessment Pain Assessment: 0-10 Pain Score: 8  Pain Location: L knee Pain Descriptors / Indicators: Aching, Sore, Guarding, Moaning Pain Intervention(s): Monitored during session, Premedicated before session, Patient requesting pain meds-RN notified, RN gave pain meds during session    Home Living                          Prior Function            PT Goals (current goals can now be found in the care plan section) Progress towards PT goals: Not progressing toward goals - comment (Limited by cognition, WB status, and functional weakness)    Frequency    Min 2X/week      PT Plan      Co-evaluation              AM-PAC PT "6 Clicks" Mobility   Outcome Measure  Help needed turning from your back to your side while in a flat bed without using bedrails?: A Lot Help needed moving from lying on your back to sitting on the side of a flat bed without using bedrails?: Total Help needed moving to and from a bed to a chair (including a wheelchair)?: Total Help needed standing up from a chair using your arms (e.g., wheelchair or bedside chair)?: Total Help needed to walk in hospital room?: Total Help needed climbing 3-5 steps with a railing? : Total 6 Click Score: 7    End of Session Equipment Utilized During Treatment: Gait belt Activity Tolerance: Patient limited by pain Patient left: in bed;with call bell/phone within reach;with bed alarm set Nurse Communication: Mobility status;Weight bearing status PT Visit Diagnosis: Difficulty in walking, not elsewhere classified (R26.2);Muscle weakness (generalized) (M62.81);Pain Pain - Right/Left: Left Pain - part of body: Leg;Knee     Time: 0981-1914 PT Time Calculation  (min) (ACUTE ONLY): 30 min  Charges:    $Therapeutic Exercise: 8-22 mins $Therapeutic Activity: 8-22 mins PT General Charges $$ ACUTE PT VISIT: 1 Visit                    D. Scott Taysha Majewski PT, DPT 05/05/23, 3:19 PM

## 2023-05-05 NOTE — NC FL2 (Signed)
  MEDICAID FL2 LEVEL OF CARE FORM     IDENTIFICATION  Patient Name: Terri Braun Birthdate: [birth year] Sex: female Admission Date (Current Location): 05/03/2023  [facility] and [place] Number:  Chiropodist and Address:  [facility], [address] [place], [place] [address]      Provider Number: [id number]  Attending Physician Name and Address:  Arnetha Courser, MD  Relative Name and Phone Number:       Current Level of Care: Hospital Recommended Level of Care: Skilled Nursing Facility Prior Approval Number:    Date Approved/Denied:   PASRR Number: [id number]  Discharge Plan: SNF    Current Diagnoses: Patient Active Problem List   Diagnosis Date Noted   UTI (urinary tract infection) 05/04/2023   COPD (chronic obstructive pulmonary disease) (HCC)    Tibial plateau fracture 05/03/2023   Tibial fracture 05/03/2023   Moderate persistent asthma without complication 03/10/2022   Essential hypertension 02/17/2022   Olecranon bursitis of right elbow 08/23/2021   Leg hematoma, left, initial encounter 05/03/2021   Syncope 05/03/2021   Osteoarthritis of left knee 04/12/2021   Leukocytes in urine 12/13/2019   Urinary frequency 12/13/2019   Closed fracture of metatarsal bone 12/25/2018   Polyp of ascending colon 02/16/2018   Diverticula, colon 02/16/2018   Hypothyroidism 02/16/2018   Essential hypertension, benign 02/16/2018   ASCVD (arteriosclerotic cardiovascular disease) 02/16/2018   Periumbilical abdominal pain 02/16/2018   GI bleed 02/16/2018   Iron deficiency anemia, unspecified 02/16/2018   Chronic liver disease 02/16/2018   Carrier of alpha-1-antitrypsin deficiency 02/16/2018   History of colonic polyps 02/16/2018   Abnormal liver CT 02/16/2018   Elevated liver enzymes 02/16/2018   History of hepatitis C 02/16/2018   Abnormal findings on imaging test 02/16/2018   Helicobacter pylori gastritis 02/16/2018   Gait  instability 02/13/2018   Mixed hyperlipidemia 02/13/2018   Atherosclerosis of native coronary artery of native heart with stable angina pectoris (HCC) 02/12/2018   Centrilobular emphysema (HCC) 02/12/2018   Anemia 02/12/2018   Benign neoplasm of cecum 01/16/2018   Heterozygous alpha 1-antitrypsin deficiency (HCC) 01/16/2018   GERD (gastroesophageal reflux disease) 01/16/2018   Bronchiectasis without complication (HCC) 05/16/2016   Moderate episode of recurrent major depressive disorder (HCC) 04/14/2016   Bloody stool 09/15/2015   Left lower quadrant pain 09/15/2015   Long term (current) use of antithrombotics/antiplatelets 09/15/2015   Dyspnea 05/18/2015   Anxiety 11/14/2011   DJD (degenerative joint disease) 11/14/2011   Abnormal stress test 04/20/2010   Dyslipidemia 04/20/2010   Status post coronary artery stent placement 04/20/2010   CAD (coronary artery disease) 04/20/2010    Orientation RESPIRATION BLADDER Height & Weight     Self, Time, Place, Situation  Normal Incontinent, External catheter Weight: 140 lb (63.5 kg) Height:  5\' 5"  (165.1 cm)  BEHAVIORAL SYMPTOMS/MOOD NEUROLOGICAL BOWEL NUTRITION STATUS   (None)  (None) Continent Diet (Regular)  AMBULATORY STATUS COMMUNICATION OF NEEDS Skin   Extensive Assist Verbally Bruising, Other (Comment) (Scratch marks.)                       Personal Care Assistance Level of Assistance  Bathing, Feeding, Dressing Bathing Assistance: Maximum assistance Feeding assistance: Limited assistance Dressing Assistance: Maximum assistance     Functional Limitations Info  Sight, Hearing, Speech Sight Info: Adequate Hearing Info: Adequate Speech Info: Adequate    SPECIAL CARE FACTORS FREQUENCY  PT (By licensed PT), OT (By licensed OT)     PT Frequency:  5 x week OT Frequency: 5 x week            Contractures Contractures Info: Not present    Additional Factors Info  Code Status, Allergies Code Status Info: Full  code Allergies Info: Shellfish Allergy, Shellfish-derived products, Sulfa antibiotics           Current Medications (05/05/2023):  This is the current hospital active medication list Current Facility-Administered Medications  Medication Dose Route Frequency Provider Last Rate Last Admin   albuterol (PROVENTIL) (2.5 MG/3ML) 0.083% nebulizer solution 3 mL  3 mL Inhalation Q6H PRN Emeline General, MD       bisacodyl (DULCOLAX) EC tablet 5 mg  5 mg Oral Daily PRN Mikey College T, MD   5 mg at 05/05/23 1028   cefTRIAXone (ROCEPHIN) 2 g in sodium chloride 0.9 % 100 mL IVPB  2 g Intravenous Q24H Arnetha Courser, MD 200 mL/hr at 05/05/23 1116 2 g at 05/05/23 1116   clopidogrel (PLAVIX) tablet 75 mg  75 mg Oral Daily Mikey College T, MD   75 mg at 05/05/23 1028   DULoxetine (CYMBALTA) DR capsule 60 mg  60 mg Oral Daily Mikey College T, MD   60 mg at 05/05/23 1028   enoxaparin (LOVENOX) injection 40 mg  40 mg Subcutaneous Q24H Mikey College T, MD   40 mg at 05/04/23 1108   fluticasone furoate-vilanterol (BREO ELLIPTA) 100-25 MCG/ACT 1 puff  1 puff Inhalation Daily Mikey College T, MD   1 puff at 05/05/23 1036   And   umeclidinium bromide (INCRUSE ELLIPTA) 62.5 MCG/ACT 1 puff  1 puff Inhalation Daily Mikey College T, MD   1 puff at 05/05/23 1036   HYDROcodone-acetaminophen (NORCO/VICODIN) 5-325 MG per tablet 1-2 tablet  1-2 tablet Oral Q6H PRN Mikey College T, MD   1 tablet at 05/05/23 1028   HYDROmorphone (DILAUDID) injection 0.5 mg  0.5 mg Intravenous Q4H PRN Mikey College T, MD   0.5 mg at 05/05/23 0037   isosorbide mononitrate (IMDUR) 24 hr tablet 30 mg  30 mg Oral Daily Mikey College T, MD   30 mg at 05/05/23 1028   losartan (COZAAR) tablet 50 mg  50 mg Oral Daily Mikey College T, MD   50 mg at 05/05/23 1028   melatonin tablet 5 mg  5 mg Oral QHS Mikey College T, MD   5 mg at 05/04/23 2134   ondansetron (ZOFRAN) injection 4 mg  4 mg Intravenous Q6H PRN Mikey College T, MD       pantoprazole (PROTONIX) EC tablet 20 mg  20 mg  Oral Daily Mikey College T, MD   20 mg at 05/05/23 1028   rosuvastatin (CRESTOR) tablet 10 mg  10 mg Oral Daily Mikey College T, MD   10 mg at 05/04/23 2134   senna-docusate (Senokot-S) tablet 1 tablet  1 tablet Oral QHS PRN Emeline General, MD         Discharge Medications: Please see discharge summary for a list of discharge medications.  Relevant Imaging Results:  Relevant Lab Results:   Additional Information SS#: [id number]  Margarito Liner, LCSW

## 2023-05-05 NOTE — Assessment & Plan Note (Signed)
 Left proximal fibula metaphyseal fracture, nondisplaced . Orthopedic surgery reviewed her case and recommended conservative management with a hinged left knee brace, pain management and PT. she will be nonweightbearing for 6 to 8 weeks on left lower extremity PT is recommending SNF -Continue with pain management -TOC to work on SNF placement

## 2023-05-05 NOTE — Plan of Care (Signed)
 ?  Problem: Clinical Measurements: ?Goal: Ability to maintain clinical measurements within normal limits will improve ?Outcome: Progressing ?Goal: Will remain free from infection ?Outcome: Progressing ?Goal: Diagnostic test results will improve ?Outcome: Progressing ?  ?

## 2023-05-05 NOTE — Progress Notes (Signed)
 Occupational Therapy Treatment Patient Details Name: Terri Braun MRN: [id number] DOB: 1929-11-17 Today's Date: 05/05/2023   History of present illness Terri Braun is a 89 y.o. female with medical history significant of CAD status post stenting on Plavix, HTN, hypothyroidism, COPD, severe left knee OA, presented with recurrent fall and Left proximal fibula metaphyseal fracture managed nonoperatively.   OT comments  Terri Braun was seen for OT treatment on this date. Upon arrival to room pt in bed, agreeable to tx. Pt requires MAX A bed mobility, fair sitting balance. Pt appears fearful of falling and RN in to administer pain medicine. TOTAL A for bed>chair lateral scoot t/f, assist to maintain NWBing. Educated pt/family on brace and NWB pcns. Pt making progress toward goals, will continue to follow POC. Discharge recommendation remains appropriate.        If plan is discharge home, recommend the following:  Two people to help with walking and/or transfers;Two people to help with bathing/dressing/bathroom   Equipment Recommendations  Other (comment) (defer)    Recommendations for Other Services      Precautions / Restrictions Precautions Precautions: Fall Recall of Precautions/Restrictions: Impaired Restrictions Weight Bearing Restrictions Per Provider Order: Yes LLE Weight Bearing Per Provider Order: Non weight bearing Other Position/Activity Restrictions: hinged knee brace locked in extension       Mobility Bed Mobility Overal bed mobility: Needs Assistance Bed Mobility: Supine to Sit     Supine to sit: Max assist          Transfers Overall transfer level: Needs assistance   Transfers: Bed to chair/wheelchair/BSC            Lateral/Scoot Transfers: Total assist       Balance Overall balance assessment: Needs assistance Sitting-balance support: Feet unsupported, Bilateral upper extremity supported Sitting balance-Leahy Scale: Fair                                      ADL either performed or assessed with clinical judgement   ADL Overall ADL's : Needs assistance/impaired                                       General ADL Comments: MAX A don B socks seated EOB. TOTAL A for simulated BSC t/f     Communication Communication Communication: No apparent difficulties Factors Affecting Communication: Hearing impaired   Cognition Arousal: Alert Behavior During Therapy: WFL for tasks assessed/performed Cognition: History of cognitive impairments                               Following commands: Impaired Following commands impaired: Follows one step commands with increased time      Cueing   Cueing Techniques: Verbal cues  Exercises      Shoulder Instructions       General Comments      Pertinent Vitals/ Pain       Pain Assessment Pain Assessment: Faces Faces Pain Scale: Hurts even more Pain Location: L knee Pain Descriptors / Indicators: Discomfort, Guarding, Grimacing Pain Intervention(s): Patient requesting pain meds-RN notified, RN gave pain meds during session   Frequency  Min 2X/week        Progress Toward Goals  OT Goals(current goals can now be found in the care plan section)  Progress  towards OT goals: Progressing toward goals  Acute Rehab OT Goals OT Goal Formulation: With patient/family Time For Goal Achievement: 05/17/23 Potential to Achieve Goals: Fair ADL Goals Pt Will Perform Lower Body Dressing: with min assist;sitting/lateral leans Pt Will Transfer to Toilet: with mod assist;stand pivot transfer;bedside commode  Plan      Co-evaluation                 AM-PAC OT "6 Clicks" Daily Activity     Outcome Measure   Help from another person eating meals?: None Help from another person taking care of personal grooming?: A Little Help from another person toileting, which includes using toliet, bedpan, or urinal?: A Lot Help from another person  bathing (including washing, rinsing, drying)?: A Lot Help from another person to put on and taking off regular upper body clothing?: A Little Help from another person to put on and taking off regular lower body clothing?: A Lot 6 Click Score: 16    End of Session    OT Visit Diagnosis: Other abnormalities of gait and mobility (R26.89);Muscle weakness (generalized) (M62.81)   Activity Tolerance Patient tolerated treatment well   Patient Left in chair;with call bell/phone within reach;with nursing/sitter in room;with family/visitor present   Nurse Communication Mobility status;Patient requests pain meds        Time: 1015-1029 OT Time Calculation (min): 14 min  Charges: OT General Charges $OT Visit: 1 Visit OT Treatments $Self Care/Home Management : 8-22 mins  Kathie Dike, M.S. OTR/L  05/05/23, 12:50 PM  ascom [phone]

## 2023-05-05 NOTE — Assessment & Plan Note (Addendum)
 UA concerning for UTI.  Patient was having increased urinary frequency with nocturia, progressive weakness and recent multiple falls.  Urine culture with multiple species. - Completed the course of antibiotic

## 2023-05-05 NOTE — TOC Progression Note (Signed)
 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 sent fl2 complete [employer] obtained  Expected Discharge Plan: Skilled Nursing Facility Barriers to Discharge: SNF Pending bed offer, Conservator, museum/gallery and Services   Discharge Planning Services: CM Consult   Living arrangements for the past 2 months: Marketing executive                   DME Agency: NA       HH Arranged: NA           Social Determinants of Health (SDOH) Interventions SDOH Screenings   Food Insecurity: No Food Insecurity (05/03/2023)  Housing: Low Risk  (05/03/2023)  Transportation Needs: No Transportation Needs (05/03/2023)  Utilities: Not At Risk (05/03/2023)  Alcohol Screen: Low Risk  (09/29/2021)  Depression (PHQ2-9): Low Risk  (09/29/2021)  Financial Resource Strain: Low Risk  (09/29/2021)  Physical Activity: Insufficiently Active (09/29/2021)  Social Connections: Moderately Isolated (05/03/2023)  Stress: No Stress Concern Present (09/29/2021)  Tobacco Use: Low Risk  (05/03/2023)    Readmission Risk Interventions     No data to display

## 2023-05-05 NOTE — Plan of Care (Signed)

## 2023-05-05 NOTE — Progress Notes (Signed)
 Progress Note   Patient: Terri Braun ZOX:[id number] DOB: [birth year] DOA: 05/03/2023     1 DOS: the patient was seen and examined on 05/05/2023   Brief hospital course: Taken from H&P.   Terri Braun is a 89 y.o. female with medical history significant of CAD status post stenting on Plavix, HTN, hypothyroidism, COPD, severe left knee OA, presented with recurrent fall and fracture left knee.   Patient lives alone in an independent living facility and is currently being worked up moving to ALF due to frequent falls.  On presentation hemodynamically stable,CT left knee showed acute complete minimally displaced fracture of proximal tibial diaphysis and acute nondisplaced fracture of proximal fibula metaphyseal region, severe OA left knee.   Orthopedic surgery was consulted and they were advising conservative management with hinged knee brace and outpatient follow-up.  4/3: Blood pressure elevated at 170/54, UA yesterday with moderate leukocytes and rare bacteria.  Rest of the labs stable.  Ordered urine culture and starting her on ceftriaxone.  Patient was having increased urinary frequency, progressive weakness and multiple recent falls.  4/4: Blood pressure mildly elevated.  Urine cultures with multiple species, based on her symptoms we decided to treat for 5 days.  Continuing ceftriaxone while in the hospital.  She was placed on hinged knee brace, locked in extension.  She will be nonweightbearing on left lower extremity for 6 to 8 weeks and follow-up with orthopedic surgery as outpatient. TOC is looking for placement.  Assessment and Plan: * Tibial plateau fracture Left proximal fibula metaphyseal fracture, nondisplaced . Orthopedic surgery reviewed her case and recommended conservative management with a hinged left knee brace, pain management and PT. she will be nonweightbearing for 6 to 8 weeks on left lower extremity PT is recommending SNF -Continue with pain management -TOC to work on  SNF placement  UTI (urinary tract infection) UA concerning for UTI.  Patient was having increased urinary frequency with nocturia, progressive weakness and recent multiple falls.  Urine culture with multiple species. - Continue ceftriaxone  Essential hypertension Blood pressure mildly elevated -Continue home losartan and Imdur  COPD (chronic obstructive pulmonary disease) (HCC) No acute concern. -Continue home Trelegy -As needed albuterol  CAD (coronary artery disease) No chest pain.  Troponin negative -Continue home Imdur, Plavix and Crestor  GERD (gastroesophageal reflux disease) -Continue PPI   Subjective: Patient was sitting comfortably in chair when seen today.  Pain seems well-controlled.  Physical Exam: Vitals:   05/04/23 1559 05/04/23 2016 05/05/23 0413 05/05/23 0843  BP: (!) 148/61 (!) 140/57 (!) 146/54 (!) 155/51  Pulse: 80 76 86 81  Resp: 12 20 12 16   Temp: 98.8 F (37.1 C) 98.5 F (36.9 C) 98 F (36.7 C) 98.3 F (36.8 C)  TempSrc:      SpO2: 92% 91% 90% 98%  Weight:      Height:       General.  Frail elderly lady, in no acute distress. Pulmonary.  Lungs clear bilaterally, normal respiratory effort. CV.  Regular rate and rhythm, no JVD, rub or murmur. Abdomen.  Soft, nontender, nondistended, BS positive. CNS.  Alert and oriented .  No focal neurologic deficit. Extremities.  Trace left LE edema, left knee in brace Psychiatry.  Judgment and insight appears normal.   Data Reviewed: Prior records reviewed  Family Communication: Discussed with SIL at bedside  Disposition: Status is: Inpatient The patient will require care spanning > 2 midnights and should be moved to inpatient because: Severity of illness  Planned Discharge Destination:  Skilled nursing facility  DVT prophylaxis.  Lovenox Time spent: 44 minutes  This record has been created using Conservation officer, historic buildings. Errors have been sought and corrected,but may not always be located.  Such creation errors do not reflect on the standard of care.   Author: Arnetha Courser, MD 05/05/2023 2:56 PM  For on call review [email or web address].

## 2023-05-05 NOTE — Progress Notes (Signed)
 Patient ID: Terri Braun, female   DOB: 01/17/1930, 89 y.o.   MRN: [id number]  Subjective: The patient appears to be slightly more comfortable this morning as compared to last evening.  She has no new complaints pertaining to her left knee and lower extremity.   Objective: Vital signs in last 24 hours: Temp:  [97.9 F (36.6 C)-98.8 F (37.1 C)] 98 F (36.7 C) (04/04 0413) Pulse Rate:  [73-86] 86 (04/04 0413) Resp:  [12-20] 12 (04/04 0413) BP: (110-170)/(42-61) 146/54 (04/04 0413) SpO2:  [90 %-100 %] 90 % (04/04 0413)  Intake/Output from previous day: 04/03 0701 - 04/04 0700 In: 309.3 [P.O.:240; IV Piggyback:69.3] Out: 450 [Urine:450] Intake/Output this shift: No intake/output data recorded.  Recent Labs    05/03/23 0224  HGB 9.0*   Recent Labs    05/03/23 0224  WBC 6.4  RBC 3.00*  HCT 28.3*  PLT 210   Recent Labs    05/03/23 0224  NA 137  K 4.3  CL 104  CO2 25  BUN 28*  CREATININE 0.99  GLUCOSE 120*  CALCIUM 8.8*   Recent Labs    05/03/23 0224  INR 1.0    Physical Exam: Orthopedic examination again is limited to the left knee and lower extremity.  Moderate swelling again is noted around the knee as well as in the proximal tibial region.  She has moderate tenderness to palpation over the proximal tibial and knee region.  She has more severe pain with any attempted active or passive motion of the left lower extremity.  She is grossly neurovascular intact to the left foot.  Assessment: 1.  Nondisplaced left proximal tib-fib fracture. 2.  Severe advanced tricompartmental degenerative joint disease left knee.  Plan: The treatment options have been reviewed with the patient.  After obtaining verbal consent, the knee immobilizer is removed and the patient is placed into a hinged range of motion brace with the brace locked in extension.  The patient may be mobilized with physical therapy, but is to remain nonweightbearing on the left lower extremity.  She will  need to be nonweightbearing for 6 to 8 weeks.  The patient will require rehab placement during this time.   Excell Seltzer Creig Landin 05/05/2023, 8:20 AM

## 2023-05-06 DIAGNOSIS — S82142A Displaced bicondylar fracture of left tibia, initial encounter for closed fracture: Secondary | ICD-10-CM | POA: Diagnosis not present

## 2023-05-06 DIAGNOSIS — I10 Essential (primary) hypertension: Secondary | ICD-10-CM | POA: Diagnosis not present

## 2023-05-06 DIAGNOSIS — J449 Chronic obstructive pulmonary disease, unspecified: Secondary | ICD-10-CM | POA: Diagnosis not present

## 2023-05-06 DIAGNOSIS — N3000 Acute cystitis without hematuria: Secondary | ICD-10-CM | POA: Diagnosis not present

## 2023-05-06 NOTE — Progress Notes (Signed)
 Progress Note   Patient: Terri Braun EAV:[id number] DOB: August 12, 1929 DOA: 05/03/2023     2 DOS: the patient was seen and examined on 05/06/2023   Brief hospital course: Taken from H&P.   Izabell Schalk is a 89 y.o. female with medical history significant of CAD status post stenting on Plavix, HTN, hypothyroidism, COPD, severe left knee OA, presented with recurrent fall and fracture left knee.   Patient lives alone in an independent living facility and is currently being worked up moving to ALF due to frequent falls.  On presentation hemodynamically stable,CT left knee showed acute complete minimally displaced fracture of proximal tibial diaphysis and acute nondisplaced fracture of proximal fibula metaphyseal region, severe OA left knee.   Orthopedic surgery was consulted and they were advising conservative management with hinged knee brace and outpatient follow-up.  4/3: Blood pressure elevated at 170/54, UA yesterday with moderate leukocytes and rare bacteria.  Rest of the labs stable.  Ordered urine culture and starting her on ceftriaxone.  Patient was having increased urinary frequency, progressive weakness and multiple recent falls.  4/4: Blood pressure mildly elevated.  Urine cultures with multiple species, based on her symptoms we decided to treat for 5 days.  Continuing ceftriaxone while in the hospital.  She was placed on hinged knee brace, locked in extension.  She will be nonweightbearing on left lower extremity for 6 to 8 weeks and follow-up with orthopedic surgery as outpatient. TOC is looking for placement.  4/5: Medically stable, awaiting SNF.  Assessment and Plan: * Tibial plateau fracture Left proximal fibula metaphyseal fracture, nondisplaced . Orthopedic surgery reviewed her case and recommended conservative management with a hinged left knee brace, pain management and PT. she will be nonweightbearing for 6 to 8 weeks on left lower extremity PT is recommending  SNF -Continue with pain management -TOC to work on SNF placement  UTI (urinary tract infection) UA concerning for UTI.  Patient was having increased urinary frequency with nocturia, progressive weakness and recent multiple falls.  Urine culture with multiple species. - Continue ceftriaxone  Essential hypertension Blood pressure mildly elevated -Continue home losartan and Imdur  COPD (chronic obstructive pulmonary disease) (HCC) No acute concern. -Continue home Trelegy -As needed albuterol  CAD (coronary artery disease) No chest pain.  Troponin negative -Continue home Imdur, Plavix and Crestor  GERD (gastroesophageal reflux disease) -Continue PPI   Subjective: Patient was lying down comfortably when seen today.  No new concern.  Pain seems well-controlled.  Physical Exam: Vitals:   05/05/23 1547 05/05/23 2046 05/06/23 0500 05/06/23 0815  BP: (!) 120/52 (!) 134/48 (!) 144/49 (!) 142/58  Pulse: 80 86 92 88  Resp: 16 16 20 16   Temp: 98.5 F (36.9 C) 98.2 F (36.8 C) 99.6 F (37.6 C) 98.6 F (37 C)  TempSrc:   Oral   SpO2: 94% 95% 94% 94%  Weight:      Height:       General.  Frail elderly lady, in no acute distress. Pulmonary.  Lungs clear bilaterally, normal respiratory effort. CV.  Regular rate and rhythm, no JVD, rub or murmur. Abdomen.  Soft, nontender, nondistended, BS positive. CNS.  Alert and oriented .  No focal neurologic deficit. Extremities.  No edema, no cyanosis, pulses intact and symmetrical. Psychiatry.  Judgment and insight appears normal.    Data Reviewed: Prior records reviewed  Family Communication: Discussed with SIL at bedside  Disposition: Status is: Inpatient The patient will require care spanning > 2 midnights and should be moved  to inpatient because: Severity of illness  Planned Discharge Destination: Skilled nursing facility  DVT prophylaxis.  Lovenox Time spent: 43 minutes  This record has been created using Software engineer. Errors have been sought and corrected,but may not always be located. Such creation errors do not reflect on the standard of care.   Author: Arnetha Courser, MD 05/06/2023 12:27 PM  For on call review [email or web address].

## 2023-05-06 NOTE — Plan of Care (Signed)
  Problem: Education: Goal: Knowledge of General Education information will improve Description: Including pain rating scale, medication(s)/side effects and non-pharmacologic comfort measures Outcome: Progressing   Problem: Activity: Goal: Risk for activity intolerance will decrease Outcome: Progressing   Problem: Elimination: Goal: Will not experience complications related to bowel motility Outcome: Progressing   Problem: Pain Managment: Goal: General experience of comfort will improve and/or be controlled Outcome: Progressing   Problem: Safety: Goal: Ability to remain free from injury will improve Outcome: Progressing   Problem: Skin Integrity: Goal: Risk for impaired skin integrity will decrease Outcome: Progressing

## 2023-05-07 DIAGNOSIS — I10 Essential (primary) hypertension: Secondary | ICD-10-CM | POA: Diagnosis not present

## 2023-05-07 DIAGNOSIS — N3000 Acute cystitis without hematuria: Secondary | ICD-10-CM | POA: Diagnosis not present

## 2023-05-07 DIAGNOSIS — S82142A Displaced bicondylar fracture of left tibia, initial encounter for closed fracture: Secondary | ICD-10-CM | POA: Diagnosis not present

## 2023-05-07 DIAGNOSIS — J449 Chronic obstructive pulmonary disease, unspecified: Secondary | ICD-10-CM | POA: Diagnosis not present

## 2023-05-07 MED ORDER — POLYETHYLENE GLYCOL 3350 17 G PO PACK
17.0000 g | PACK | Freq: Two times a day (BID) | ORAL | Status: DC
  Administered 2023-05-07 – 2023-05-11 (×9): 17 g via ORAL
  Filled 2023-05-07 (×9): qty 1

## 2023-05-07 NOTE — Progress Notes (Signed)
 Progress Note   Patient: Terri Braun UJW:[id number] DOB: 1929/06/28 DOA: 05/03/2023     3 DOS: the patient was seen and examined on 05/07/2023   Brief hospital course: Taken from H&P.   Lazaria Schaben is a 89 y.o. female with medical history significant of CAD status post stenting on Plavix, HTN, hypothyroidism, COPD, severe left knee OA, presented with recurrent fall and fracture left knee.   Patient lives alone in an independent living facility and is currently being worked up moving to ALF due to frequent falls.  On presentation hemodynamically stable,CT left knee showed acute complete minimally displaced fracture of proximal tibial diaphysis and acute nondisplaced fracture of proximal fibula metaphyseal region, severe OA left knee.   Orthopedic surgery was consulted and they were advising conservative management with hinged knee brace and outpatient follow-up.  4/3: Blood pressure elevated at 170/54, UA yesterday with moderate leukocytes and rare bacteria.  Rest of the labs stable.  Ordered urine culture and starting her on ceftriaxone.  Patient was having increased urinary frequency, progressive weakness and multiple recent falls.  4/4: Blood pressure mildly elevated.  Urine cultures with multiple species, based on her symptoms we decided to treat for 5 days.  Continuing ceftriaxone while in the hospital.  She was placed on hinged knee brace, locked in extension.  She will be nonweightbearing on left lower extremity for 6 to 8 weeks and follow-up with orthopedic surgery as outpatient. TOC is looking for placement.  4/5: Medically stable, awaiting SNF.  4/6: Added MiraLAX as still no bowel movement.  Otherwise stable for SNF  Assessment and Plan: * Tibial plateau fracture Left proximal fibula metaphyseal fracture, nondisplaced . Orthopedic surgery reviewed her case and recommended conservative management with a hinged left knee brace, pain management and PT. she will be  nonweightbearing for 6 to 8 weeks on left lower extremity PT is recommending SNF -Continue with pain management -TOC to work on SNF placement  UTI (urinary tract infection) UA concerning for UTI.  Patient was having increased urinary frequency with nocturia, progressive weakness and recent multiple falls.  Urine culture with multiple species. - Continue ceftriaxone  Essential hypertension Blood pressure mildly elevated -Continue home losartan and Imdur  COPD (chronic obstructive pulmonary disease) (HCC) No acute concern. -Continue home Trelegy -As needed albuterol  CAD (coronary artery disease) No chest pain.  Troponin negative -Continue home Imdur, Plavix and Crestor  GERD (gastroesophageal reflux disease) -Continue PPI   Subjective: Patient is complaining of left leg pain and asking for some pain medications when seen today.  Physical Exam: Vitals:   05/06/23 1524 05/06/23 1654 05/06/23 2010 05/07/23 0833  BP: (!) 109/44 (!) 122/46 (!) 126/47 (!) 143/53  Pulse: 80 81 84 85  Resp: 16  16 20   Temp: 98.5 F (36.9 C)  98 F (36.7 C) 98.2 F (36.8 C)  TempSrc:      SpO2: 97%  95% 97%  Weight:      Height:       General.  Frail elderly lady, in no acute distress. Pulmonary.  Lungs clear bilaterally, normal respiratory effort. CV.  Regular rate and rhythm, no JVD, rub or murmur. Abdomen.  Soft, nontender, nondistended, BS positive. CNS.  Alert and oriented .  No focal neurologic deficit. Extremities.  No edema, left knee with knee brace  Data Reviewed: Prior records reviewed  Family Communication:   Disposition: Status is: Inpatient The patient will require care spanning > 2 midnights and should be moved to inpatient because:  Severity of illness  Planned Discharge Destination: Skilled nursing facility  DVT prophylaxis.  Lovenox Time spent: 42 minutes  This record has been created using Conservation officer, historic buildings. Errors have been sought and  corrected,but may not always be located. Such creation errors do not reflect on the standard of care.   Author: Arnetha Courser, MD 05/07/2023 1:51 PM  For on call review [email or web address].

## 2023-05-07 NOTE — Plan of Care (Signed)
   Problem: Activity: Goal: Risk for activity intolerance will decrease Outcome: Progressing   Problem: Nutrition: Goal: Adequate nutrition will be maintained Outcome: Progressing   Problem: Pain Managment: Goal: General experience of comfort will improve and/or be controlled Outcome: Progressing

## 2023-05-07 NOTE — Plan of Care (Signed)

## 2023-05-08 DIAGNOSIS — J449 Chronic obstructive pulmonary disease, unspecified: Secondary | ICD-10-CM | POA: Diagnosis not present

## 2023-05-08 DIAGNOSIS — S82142A Displaced bicondylar fracture of left tibia, initial encounter for closed fracture: Secondary | ICD-10-CM | POA: Diagnosis not present

## 2023-05-08 DIAGNOSIS — I10 Essential (primary) hypertension: Secondary | ICD-10-CM | POA: Diagnosis not present

## 2023-05-08 DIAGNOSIS — N3000 Acute cystitis without hematuria: Secondary | ICD-10-CM | POA: Diagnosis not present

## 2023-05-08 NOTE — Progress Notes (Signed)
 Physical Therapy Treatment Patient Details Name: Terri Braun MRN: [id number] DOB: 01-27-1930 Today's Date: 05/08/2023   History of Present Illness Terri Braun is a 89 y.o. female with medical history significant of CAD status post stenting on Plavix, HTN, hypothyroidism, COPD, severe left knee OA, presented with recurrent fall and Left proximal fibula metaphyseal fracture managed nonoperatively.    PT Comments  Pt resting in bed upon PT arrival; pt agreeable to session; pt's family left during session.  8/10 L knee pain at rest beginning/end of session (nurse notified of pt's request for pain medication).  During session pt was 1-2 assist with bed mobility; SBA sitting balance; and max assist x2 to stand from bed x2 trials up to RW (assist to maintain NWB'ing status L LE).  Will continue to focus on strengthening, balance, and progressive functional mobility during hospitalization.    If plan is discharge home, recommend the following: Two people to help with walking and/or transfers;Two people to help with bathing/dressing/bathroom;Assist for transportation;Help with stairs or ramp for entrance;Supervision due to cognitive status;Assistance with cooking/housework;Direct supervision/assist for medications management   Can travel by private vehicle     No  Equipment Recommendations  Other (comment) (TBD at next venue of care)    Recommendations for Other Services       Precautions / Restrictions Precautions Precautions: Fall Recall of Precautions/Restrictions: Impaired Restrictions Weight Bearing Restrictions Per Provider Order: Yes LLE Weight Bearing Per Provider Order: Non weight bearing Other Position/Activity Restrictions: hinged knee brace locked in extension     Mobility  Bed Mobility Overal bed mobility: Needs Assistance Bed Mobility: Supine to Sit, Sit to Supine     Supine to sit: Max assist Sit to supine: Max assist, +2 for physical assistance   General bed  mobility comments: assist for trunk and B LE's; vc's for technique    Transfers Overall transfer level: Needs assistance Equipment used: Rolling walker (2 wheels) Transfers: Sit to/from Stand Sit to Stand: Max assist, +2 physical assistance           General transfer comment: vc's for UE/LE placement; assist for L LE NWB'ing status; assist to initiate and come to full stand; assist to control descent sitting    Ambulation/Gait               General Gait Details: Not appropriate at this time   Stairs             Wheelchair Mobility     Tilt Bed    Modified Rankin (Stroke Patients Only)       Balance Overall balance assessment: Needs assistance Sitting-balance support: Bilateral upper extremity supported (R LE supported) Sitting balance-Leahy Scale: Fair Sitting balance - Comments: steady static sitting                                    Communication Communication Communication: Impaired Factors Affecting Communication: Hearing impaired  Cognition Arousal: Alert Behavior During Therapy: Anxious   PT - Cognitive impairments: History of cognitive impairments                         Following commands: Impaired Following commands impaired: Follows one step commands with increased time, Follows one step commands inconsistently    Cueing Cueing Techniques: Verbal cues, Tactile cues, Visual cues  Exercises      General Comments General comments (skin integrity, edema, etc.): L LE  hinged knee brace in place and locked in extension.  Nursing cleared pt for participation in physical therapy.  Pt agreeable to PT session.      Pertinent Vitals/Pain Pain Assessment Pain Assessment: 0-10 Pain Score: 8  Pain Location: L knee Pain Descriptors / Indicators: Aching, Sore, Guarding, Moaning Pain Intervention(s): Limited activity within patient's tolerance, Monitored during session, Repositioned, Patient requesting pain meds-RN  notified    Home Living                          Prior Function            PT Goals (current goals can now be found in the care plan section) Acute Rehab PT Goals Patient Stated Goal: family goal to get to rehab possibly at [facility] PT Goal Formulation: With family Time For Goal Achievement: 05/17/23 Potential to Achieve Goals: Fair Additional Goals Additional Goal #1: patient will propel wheelchair 163ft with CGA in preparation for mobility in her apartment Progress towards PT goals: Progressing toward goals    Frequency    Min 2X/week      PT Plan      Co-evaluation              AM-PAC PT "6 Clicks" Mobility   Outcome Measure  Help needed turning from your back to your side while in a flat bed without using bedrails?: A Lot Help needed moving from lying on your back to sitting on the side of a flat bed without using bedrails?: A Lot Help needed moving to and from a bed to a chair (including a wheelchair)?: Total Help needed standing up from a chair using your arms (e.g., wheelchair or bedside chair)?: Total Help needed to walk in hospital room?: Total Help needed climbing 3-5 steps with a railing? : Total 6 Click Score: 8    End of Session Equipment Utilized During Treatment: Gait belt Activity Tolerance: Patient limited by pain Patient left: in bed;with call bell/phone within reach;with bed alarm set;Other (comment) (B heels floating via pillow support) Nurse Communication: Mobility status;Weight bearing status;Patient requests pain meds;Precautions PT Visit Diagnosis: Difficulty in walking, not elsewhere classified (R26.2);Muscle weakness (generalized) (M62.81);Pain Pain - Right/Left: Left Pain - part of body: Leg;Knee     Time: 1610-9604 PT Time Calculation (min) (ACUTE ONLY): 26 min  Charges:    $Therapeutic Activity: 23-37 mins PT General Charges $$ ACUTE PT VISIT: 1 Visit                     Hendricks Limes, PT 05/08/23, 4:06  PM

## 2023-05-08 NOTE — Care Management Important Message (Signed)
 Important Message  Patient Details  Name: Terri Braun MRN: [id number] Date of Birth: 07/04/1929   Important Message Given:  Yes - Medicare IM     Cristela Blue, CMA 05/08/2023, 10:33 AM

## 2023-05-08 NOTE — TOC Progression Note (Signed)
 Transition of Care [facility]) - Progression Note    Patient Details  Name: Terri Braun MRN: [id number] Date of Birth: 08-10-1929  [facility]) CM/SW Contact  Marlowe Sax, RN Phone Number: 05/08/2023, 12:21 PM  Clinical Narrative:     Spoke with the patient and her son in law, I reviewed that [facility] is the only bed offer at this time, they requested that I check with [facility], I called and spoke with Cala Bradford, they are reviewing and will let us know   Expected Discharge Plan: Skilled Nursing Facility Barriers to Discharge: SNF Pending bed offer, Insurance Authorization  Expected Discharge Plan and Services   Discharge Planning Services: CM Consult   Living arrangements for the past 2 months: Independent Living Facility                   DME Agency: NA       HH Arranged: NA           Social Determinants of Health (SDOH) Interventions SDOH Screenings   Food Insecurity: No Food Insecurity (05/03/2023)  Housing: Low Risk  (05/03/2023)  Transportation Needs: No Transportation Needs (05/03/2023)  Utilities: Not At Risk (05/03/2023)  Alcohol Screen: Low Risk  (09/29/2021)  Depression (PHQ2-9): Low Risk  (09/29/2021)  Financial Resource Strain: Low Risk  (09/29/2021)  Physical Activity: Insufficiently Active (09/29/2021)  Social Connections: Moderately Isolated (05/03/2023)  Stress: No Stress Concern Present (09/29/2021)  Tobacco Use: Low Risk  (05/03/2023)    Readmission Risk Interventions     No data to display

## 2023-05-08 NOTE — Progress Notes (Signed)
 Progress Note   Patient: Terri Braun ZOX:[id number] DOB: April 26, 1929 DOA: 05/03/2023     4 DOS: the patient was seen and examined on 05/08/2023   Brief hospital course: Taken from H&P.   Terri Braun is a 89 y.o. female with medical history significant of CAD status post stenting on Plavix, HTN, hypothyroidism, COPD, severe left knee OA, presented with recurrent fall and fracture left knee.   Patient lives alone in an independent living facility and is currently being worked up moving to ALF due to frequent falls.  On presentation hemodynamically stable,CT left knee showed acute complete minimally displaced fracture of proximal tibial diaphysis and acute nondisplaced fracture of proximal fibula metaphyseal region, severe OA left knee.   Orthopedic surgery was consulted and they were advising conservative management with hinged knee brace and outpatient follow-up.  4/3: Blood pressure elevated at 170/54, UA yesterday with moderate leukocytes and rare bacteria.  Rest of the labs stable.  Ordered urine culture and starting her on ceftriaxone.  Patient was having increased urinary frequency, progressive weakness and multiple recent falls.  4/4: Blood pressure mildly elevated.  Urine cultures with multiple species, based on her symptoms we decided to treat for 5 days.  Continuing ceftriaxone while in the hospital.  She was placed on hinged knee brace, locked in extension.  She will be nonweightbearing on left lower extremity for 6 to 8 weeks and follow-up with orthopedic surgery as outpatient. TOC is looking for placement.  4/5: Medically stable, awaiting SNF.  4/6: Added MiraLAX as still no bowel movement.  Otherwise stable for SNF  4/7: Hemodynamically stable.  Had a BM-awaiting placement.  Assessment and Plan: * Tibial plateau fracture Left proximal fibula metaphyseal fracture, nondisplaced . Orthopedic surgery reviewed her case and recommended conservative management with a hinged left  knee brace, pain management and PT. she will be nonweightbearing for 6 to 8 weeks on left lower extremity PT is recommending SNF -Continue with pain management -TOC to work on SNF placement  UTI (urinary tract infection) UA concerning for UTI.  Patient was having increased urinary frequency with nocturia, progressive weakness and recent multiple falls.  Urine culture with multiple species. - Continue ceftriaxone  Essential hypertension Blood pressure mildly elevated -Continue home losartan and Imdur  COPD (chronic obstructive pulmonary disease) (HCC) No acute concern. -Continue home Trelegy -As needed albuterol  CAD (coronary artery disease) No chest pain.  Troponin negative -Continue home Imdur, Plavix and Crestor  GERD (gastroesophageal reflux disease) -Continue PPI   Subjective: Patient was eating breakfast when seen today.  Pain seems well-controlled.  Physical Exam: Vitals:   05/07/23 2049 05/07/23 2052 05/08/23 0417 05/08/23 0754  BP: (!) 131/49 (!) 131/49 (!) 149/56 (!) 147/58  Pulse: 83 73 79 79  Resp: 16 16 16 18   Temp: 98.9 F (37.2 C) 98.9 F (37.2 C) 98.4 F (36.9 C) 98.1 F (36.7 C)  TempSrc: Oral Oral Oral   SpO2: 97% 100% 96% 98%  Weight:      Height:       General.  Frail elderly lady, in no acute distress. Pulmonary.  Lungs clear bilaterally, normal respiratory effort. CV.  Regular rate and rhythm, no JVD, rub or murmur. Abdomen.  Soft, nontender, nondistended, BS positive. CNS.  Alert and oriented .  No focal neurologic deficit. Extremities.  No edema, no cyanosis, pulses intact and symmetrical.  Data Reviewed: Prior records reviewed  Family Communication: SIL at bedside  Disposition: Status is: Inpatient The patient will require care spanning >  2 midnights and should be moved to inpatient because: Severity of illness  Planned Discharge Destination: Skilled nursing facility  DVT prophylaxis.  Lovenox Time spent: 43 minutes  This record  has been created using Conservation officer, historic buildings. Errors have been sought and corrected,but may not always be located. Such creation errors do not reflect on the standard of care.   Author: Arnetha Courser, MD 05/08/2023 1:24 PM  For on call review [email or web address].

## 2023-05-08 NOTE — Progress Notes (Signed)
 Nutrition Follow-up  DOCUMENTATION CODES:   Not applicable  INTERVENTION:   -Obtain new wt -Continue Ensure Enlive po BID, each supplement provides 350 kcal and 20 grams of protein.  -Continue regular diet -Continue MVI with minerals daily  NUTRITION DIAGNOSIS:   Increased nutrient needs related to chronic illness (COPD) as evidenced by estimated needs.  Ongoing  GOAL:   Patient will meet greater than or equal to 90% of their needs  Progressing   MONITOR:   PO intake, Supplement acceptance  REASON FOR ASSESSMENT:   Consult Assessment of nutrition requirement/status  ASSESSMENT:   Pt with medical history significant of CAD status post stenting on Plavix, HTN, hypothyroidism, COPD, severe left knee OA, presented with recurrent fall and fracture left knee.  4/4- transitioned to hinge range motion brace  Reviewed I/O's: -400 ml x 24 hours and -1.2 L since admission  UOP: 400 ml x 24 hours  Spoke with pt and son in law at bedside. Pt reports feeling well today. Son in law reports pt has a good appetite, consuming most of her meals. Son in law has also been bringing in snacks for her, such as fruit, yogurt, candy, and Kind bars. Pt typically has a good appetite, however, pt was consuming only 1-2 meals per day PTA secondary to ambulatory dysfunction. Son in law explains pt was living at [facility] P[facility] and had increased difficulty ambulating to the dining hall. Noted meal compleitons 90%.   Pt denies any weight loss. No new wt since admission. RD will obtain new wt.   Discussed importance of good meal and supplement intake to promote healing.   Per TOC notes, plan for SNF placement at discharge.   Medications reviewed and include melatonin, protonix, and miralax.   Labs reviewed.    NUTRITION - FOCUSED PHYSICAL EXAM:  Flowsheet Row Most Recent Value  Orbital Region No depletion  Upper Arm Region Mild depletion  Thoracic and Lumbar Region No depletion  Buccal  Region No depletion  Temple Region No depletion  Clavicle Bone Region No depletion  Clavicle and Acromion Bone Region No depletion  Scapular Bone Region No depletion  Dorsal Hand Mild depletion  Patellar Region No depletion  Anterior Thigh Region No depletion  Posterior Calf Region No depletion  Edema (RD Assessment) Mild  Hair Reviewed  Eyes Reviewed  Mouth Reviewed  Skin Reviewed  Nails Reviewed       Diet Order:   Diet Order             Diet regular Room service appropriate? Yes; Fluid consistency: Thin  Diet effective now                   EDUCATION NEEDS:   Education needs have been addressed  Skin:  Skin Assessment: Reviewed RN Assessment  Last BM:  05/08/23 (type 4)  Height:   Ht Readings from Last 1 Encounters:  05/03/23 5\' 5"  (1.651 m)    Weight:   Wt Readings from Last 1 Encounters:  05/02/23 63.5 kg    Ideal Body Weight:  56.8 kg  BMI:  Body mass index is 23.3 kg/m.  Estimated Nutritional Needs:   Kcal:  1600-1800  Protein:  80-95 grams  Fluid:  1.6-1.8 L    Levada Schilling, RD, LDN, CDCES Registered Dietitian III Certified Diabetes Care and Education Specialist If unable to reach this RD, please use "RD Inpatient" group chat on secure chat between hours of 8am-4 pm daily

## 2023-05-08 NOTE — Plan of Care (Signed)
  Problem: Education: Goal: Knowledge of General Education information will improve Description: Including pain rating scale, medication(s)/side effects and non-pharmacologic comfort measures 05/08/2023 2057 by Rubbie Battiest D, RN Outcome: Progressing 05/08/2023 2055 by Rubbie Battiest D, RN Outcome: Progressing   Problem: Health Behavior/Discharge Planning: Goal: Ability to manage health-related needs will improve Outcome: Progressing   Problem: Clinical Measurements: Goal: Ability to maintain clinical measurements within normal limits will improve 05/08/2023 2057 by Rubbie Battiest D, RN Outcome: Progressing 05/08/2023 2055 by Rubbie Battiest D, RN Outcome: Progressing Goal: Will remain free from infection Outcome: Progressing Goal: Diagnostic test results will improve Outcome: Progressing Goal: Respiratory complications will improve Outcome: Progressing Goal: Cardiovascular complication will be avoided Outcome: Progressing   Problem: Activity: Goal: Risk for activity intolerance will decrease Outcome: Progressing   Problem: Nutrition: Goal: Adequate nutrition will be maintained Outcome: Progressing   Problem: Coping: Goal: Level of anxiety will decrease Outcome: Progressing   Problem: Elimination: Goal: Will not experience complications related to bowel motility Outcome: Progressing Goal: Will not experience complications related to urinary retention Outcome: Progressing   Problem: Pain Managment: Goal: General experience of comfort will improve and/or be controlled Outcome: Progressing   Problem: Safety: Goal: Ability to remain free from injury will improve Outcome: Progressing   Problem: Skin Integrity: Goal: Risk for impaired skin integrity will decrease Outcome: Progressing

## 2023-05-08 NOTE — Plan of Care (Signed)
  Problem: Nutrition: Goal: Adequate nutrition will be maintained Outcome: Progressing   Problem: Pain Managment: Goal: General experience of comfort will improve and/or be controlled Outcome: Progressing   Problem: Coping: Goal: Level of anxiety will decrease Outcome: Progressing

## 2023-05-09 DIAGNOSIS — I10 Essential (primary) hypertension: Secondary | ICD-10-CM | POA: Diagnosis not present

## 2023-05-09 DIAGNOSIS — J449 Chronic obstructive pulmonary disease, unspecified: Secondary | ICD-10-CM | POA: Diagnosis not present

## 2023-05-09 DIAGNOSIS — S82142A Displaced bicondylar fracture of left tibia, initial encounter for closed fracture: Secondary | ICD-10-CM | POA: Diagnosis not present

## 2023-05-09 DIAGNOSIS — N3000 Acute cystitis without hematuria: Secondary | ICD-10-CM | POA: Diagnosis not present

## 2023-05-09 NOTE — TOC Progression Note (Signed)
 Transition of Care [facility]) - Progression Note    Patient Details  Name: Terri Braun MRN: [id number] Date of Birth: 07-01-1929  Transition of Care [facility]) CM/SW Contact  Marlowe Sax, RN Phone Number: 05/09/2023, 1:27 PM  Clinical Narrative:     [facility] still reviewing to see if they can offer a bed  Expected Discharge Plan: Skilled Nursing Facility Barriers to Discharge: SNF Pending bed offer, Insurance Authorization  Expected Discharge Plan and Services   Discharge Planning Services: CM Consult   Living arrangements for the past 2 months: Independent Living Facility                   DME Agency: NA       HH Arranged: NA           Social Determinants of Health (SDOH) Interventions SDOH Screenings   Food Insecurity: No Food Insecurity (05/03/2023)  Housing: Low Risk  (05/03/2023)  Transportation Needs: No Transportation Needs (05/03/2023)  Utilities: Not At Risk (05/03/2023)  Alcohol Screen: Low Risk  (09/29/2021)  Depression (PHQ2-9): Low Risk  (09/29/2021)  Financial Resource Strain: Low Risk  (09/29/2021)  Physical Activity: Insufficiently Active (09/29/2021)  Social Connections: Moderately Isolated (05/03/2023)  Stress: No Stress Concern Present (09/29/2021)  Tobacco Use: Low Risk  (05/03/2023)    Readmission Risk Interventions     No data to display

## 2023-05-09 NOTE — Progress Notes (Signed)
 Physical Therapy Treatment Patient Details Name: Terri Braun MRN: [id number] DOB: 03/12/1929 Today's Date: 05/09/2023   History of Present Illness Terri Braun is a 89 y.o. female with medical history significant of CAD status post stenting on Plavix, HTN, hypothyroidism, COPD, severe left knee OA, presented with recurrent fall and Left proximal fibula metaphyseal fracture managed nonoperatively.    PT Comments  Pt received semi reclined in bed agreeable to OT/PT co-treat. Pt reporting 8/10 pain in L knee. Able to minimally assist in leading LE's towards EOB but ultimately reliant on maxA+2 to fully sit EOB. Mild posterior leaning in sitting working with OT on ADL's but corrects with VC's and L knee in extension. Pt reliant on max TC's on LLE to maintain NWB status and maxA+2 for STS to RW. Pt minimally able to pivot on RLE  and reports need to return to sitting EOB endorsing pain and lightheadedness. Pt shaky and appearing anxious. HR: mid 80's and SPO2 > 90% on RA. Assisted to return to supine with reports of pt feeling improved. Pt elevated in bed with LLE elevated. All needs in reach with d/c recs remaining appropriate.    If plan is discharge home, recommend the following: Two people to help with walking and/or transfers;Two people to help with bathing/dressing/bathroom;Assist for transportation;Help with stairs or ramp for entrance;Supervision due to cognitive status;Assistance with cooking/housework;Direct supervision/assist for medications management   Can travel by private vehicle     No  Equipment Recommendations  Other (comment) (TBD)    Recommendations for Other Services       Precautions / Restrictions Precautions Precautions: Fall Recall of Precautions/Restrictions: Impaired Restrictions Weight Bearing Restrictions Per Provider Order: Yes LLE Weight Bearing Per Provider Order: Non weight bearing Other Position/Activity Restrictions: hinged knee brace locked in extension      Mobility  Bed Mobility Overal bed mobility: Needs Assistance Bed Mobility: Supine to Sit     Supine to sit: HOB elevated, Max assist, +2 for physical assistance Sit to supine: Max assist, +2 for physical assistance     Patient Response: Cooperative, Anxious  Transfers Overall transfer level: Needs assistance Equipment used: Rolling walker (2 wheels) Transfers: Sit to/from Stand Sit to Stand: Max assist, +2 physical assistance           General transfer comment: Remains needing assist for UE/LE placement to RW.    Ambulation/Gait               General Gait Details: Not appropriate at this time   Stairs             Wheelchair Mobility     Tilt Bed Tilt Bed Patient Response: Cooperative, Anxious  Modified Rankin (Stroke Patients Only)       Balance Overall balance assessment: Needs assistance Sitting-balance support: Bilateral upper extremity supported, Feet supported Sitting balance-Leahy Scale: Fair Sitting balance - Comments: Mild posterior lean in sitting but able to correct with VC's     Standing balance-Leahy Scale: Poor Standing balance comment: MaxA for standing and for LLE maintaining NWB.                            Communication Communication Communication: Impaired Factors Affecting Communication: Hearing impaired  Cognition Arousal: Alert Behavior During Therapy: Anxious   PT - Cognitive impairments: History of cognitive impairments  Following commands: Intact Following commands impaired: Follows one step commands with increased time    Cueing Cueing Techniques: Verbal cues, Gestural cues, Tactile cues  Exercises      General Comments        Pertinent Vitals/Pain Pain Assessment Pain Assessment: 0-10 Pain Score: 8  Pain Location: L knee Pain Descriptors / Indicators: Aching, Grimacing Pain Intervention(s): Limited activity within patient's tolerance, Monitored  during session, Patient requesting pain meds-RN notified    Home Living                          Prior Function            PT Goals (current goals can now be found in the care plan section) Acute Rehab PT Goals Patient Stated Goal: family goal to get to rehab possibly at [facility] PT Goal Formulation: With family Time For Goal Achievement: 05/17/23 Potential to Achieve Goals: Fair Additional Goals Additional Goal #1: patient will propel wheelchair 153ft with CGA in preparation for mobility in her apartment Progress towards PT goals: Progressing toward goals    Frequency    Min 2X/week      PT Plan      Co-evaluation PT/OT/SLP Co-Evaluation/Treatment: Yes Reason for Co-Treatment: Complexity of the patient's impairments (multi-system involvement);To address functional/ADL transfers PT goals addressed during session: Mobility/safety with mobility OT goals addressed during session: ADL's and self-care      AM-PAC PT "6 Clicks" Mobility   Outcome Measure  Help needed turning from your back to your side while in a flat bed without using bedrails?: A Lot Help needed moving from lying on your back to sitting on the side of a flat bed without using bedrails?: A Lot Help needed moving to and from a bed to a chair (including a wheelchair)?: Total Help needed standing up from a chair using your arms (e.g., wheelchair or bedside chair)?: Total Help needed to walk in hospital room?: Total Help needed climbing 3-5 steps with a railing? : Total 6 Click Score: 8    End of Session Equipment Utilized During Treatment: Gait belt Activity Tolerance: Patient limited by pain Patient left: in bed;with call bell/phone within reach;with bed alarm set Nurse Communication: Mobility status;Patient requests pain meds;Precautions PT Visit Diagnosis: Difficulty in walking, not elsewhere classified (R26.2);Muscle weakness (generalized) (M62.81);Pain Pain - Right/Left: Left Pain -  part of body: Leg;Knee     Time: 1610-9604 PT Time Calculation (min) (ACUTE ONLY): 23 min  Charges:    $Therapeutic Activity: 8-22 mins PT General Charges $$ ACUTE PT VISIT: 1 Visit                     Delphia Grates. Fairly IV, PT, DPT Physical Therapist- [facility]  05/09/2023, 12:47 PM

## 2023-05-09 NOTE — Progress Notes (Signed)
 Progress Note   Patient: Terri Braun ZOX:[id number] DOB: Jul 09, 1929 DOA: 05/03/2023     5 DOS: the patient was seen and examined on 05/09/2023   Brief hospital course: Taken from H&P.   Terri Braun is a 89 y.o. female with medical history significant of CAD status post stenting on Plavix, HTN, hypothyroidism, COPD, severe left knee OA, presented with recurrent fall and fracture left knee.   Patient lives alone in an independent living facility and is currently being worked up moving to ALF due to frequent falls.  On presentation hemodynamically stable,CT left knee showed acute complete minimally displaced fracture of proximal tibial diaphysis and acute nondisplaced fracture of proximal fibula metaphyseal region, severe OA left knee.   Orthopedic surgery was consulted and they were advising conservative management with hinged knee brace and outpatient follow-up.  4/3: Blood pressure elevated at 170/54, UA yesterday with moderate leukocytes and rare bacteria.  Rest of the labs stable.  Ordered urine culture and starting her on ceftriaxone.  Patient was having increased urinary frequency, progressive weakness and multiple recent falls.  4/4: Blood pressure mildly elevated.  Urine cultures with multiple species, based on her symptoms we decided to treat for 5 days.  Continuing ceftriaxone while in the hospital.  She was placed on hinged knee brace, locked in extension.  She will be nonweightbearing on left lower extremity for 6 to 8 weeks and follow-up with orthopedic surgery as outpatient. TOC is looking for placement.  4/5: Medically stable, awaiting SNF.  4/6: Added MiraLAX as still no bowel movement.  Otherwise stable for SNF  4/7: Hemodynamically stable.  Had a BM-awaiting placement.  Assessment and Plan: * Tibial plateau fracture Left proximal fibula metaphyseal fracture, nondisplaced . Orthopedic surgery reviewed her case and recommended conservative management with a hinged left  knee brace, pain management and PT. she will be nonweightbearing for 6 to 8 weeks on left lower extremity PT is recommending SNF -Continue with pain management -TOC to work on SNF placement  UTI (urinary tract infection) UA concerning for UTI.  Patient was having increased urinary frequency with nocturia, progressive weakness and recent multiple falls.  Urine culture with multiple species. - Completed the course of antibiotic  Essential hypertension Blood pressure mildly elevated -Continue home losartan and Imdur  COPD (chronic obstructive pulmonary disease) (HCC) No acute concern. -Continue home Trelegy -As needed albuterol  CAD (coronary artery disease) No chest pain.  Troponin negative -Continue home Imdur, Plavix and Crestor  GERD (gastroesophageal reflux disease) -Continue PPI   Subjective: Patient was seen and examined today.  No new concern.  Pain seems well-controlled as far as she does not move her leg.  Physical Exam: Vitals:   05/08/23 1149 05/08/23 2056 05/09/23 0455 05/09/23 0745  BP:  (!) 155/54 (!) 146/57 (!) 148/48  Pulse:  73 75 77  Resp:  16 16 16   Temp:  99.2 F (37.3 C) 98.7 F (37.1 C) 98.7 F (37.1 C)  TempSrc:  Oral Oral   SpO2:  95% 95% 94%  Weight: 64.6 kg     Height:       General.  Frail elderly lady, in no acute distress. Pulmonary.  Lungs clear bilaterally, normal respiratory effort. CV.  Regular rate and rhythm, no JVD, rub or murmur. Abdomen.  Soft, nontender, nondistended, BS positive. CNS.  Alert and oriented .  No focal neurologic deficit. Extremities.  No edema,  pulses intact and symmetrical.  Left knee with brace  Data Reviewed: Prior records reviewed  Family Communication: Discussed with daughter at bedside  Disposition: Status is: Inpatient The patient will require care spanning > 2 midnights and should be moved to inpatient because: Severity of illness  Planned Discharge Destination: Skilled nursing facility  DVT  prophylaxis.  Lovenox Time spent: 42 minutes  This record has been created using Conservation officer, historic buildings. Errors have been sought and corrected,but may not always be located. Such creation errors do not reflect on the standard of care.   Author: Arnetha Courser, MD 05/09/2023 2:41 PM  For on call review [email or web address].

## 2023-05-09 NOTE — Progress Notes (Signed)
 Occupational Therapy Treatment Patient Details Name: Terri Braun MRN: [id number] DOB: 07-14-1929 Today's Date: 05/09/2023   History of present illness Terri Braun is a 89 y.o. female with medical history significant of CAD status post stenting on Plavix, HTN, hypothyroidism, COPD, severe left knee OA, presented with recurrent fall and Left proximal fibula metaphyseal fracture managed nonoperatively.   OT comments  Pt seen for OT tx, co-tx with PT to optimize ADL/mobility. Pt endorsing moderate pain at rest, worsening with standing despite premedicated for pain. RN notified. Pt required MAX A +2 for all bed mobility and standing EOB with BUE support on the RW, VC for hand placement, and difficulty maintaining NWBing of LLE. In sitting, pt completed grooming tasks with SBA-CGA for sitting balance and MIN A for thoroughness with combing out the back of her hair. Pt tolerated standing for <36min before endorsing need to sit citing fear of falling and feeling weak. Pt returned to bed. Continues to benefit from skilled OT services.       If plan is discharge home, recommend the following:  Two people to help with walking and/or transfers;Two people to help with bathing/dressing/bathroom   Equipment Recommendations  Other (comment) (defer)    Recommendations for Other Services      Precautions / Restrictions Precautions Precautions: Fall Recall of Precautions/Restrictions: Impaired Restrictions Weight Bearing Restrictions Per Provider Order: Yes LLE Weight Bearing Per Provider Order: Non weight bearing Other Position/Activity Restrictions: hinged knee brace locked in extension       Mobility Bed Mobility Overal bed mobility: Needs Assistance Bed Mobility: Supine to Sit, Sit to Supine     Supine to sit: HOB elevated, Max assist, +2 for physical assistance Sit to supine: Max assist, +2 for physical assistance   General bed mobility comments: assist for trunk and B LE's; vc's for  technique    Transfers Overall transfer level: Needs assistance Equipment used: Rolling walker (2 wheels) Transfers: Sit to/from Stand Sit to Stand: Max assist, +2 physical assistance                 Balance Overall balance assessment: Needs assistance Sitting-balance support: Bilateral upper extremity supported, Feet supported Sitting balance-Leahy Scale: Fair Sitting balance - Comments: Mild posterior lean in sitting but able to correct with VC's   Standing balance support: Reliant on assistive device for balance Standing balance-Leahy Scale: Poor Standing balance comment: MaxA for standing and for LLE maintaining NWB.                           ADL either performed or assessed with clinical judgement   ADL Overall ADL's : Needs assistance/impaired     Grooming: Sitting;Supervision/safety;Contact guard assist;Wash/dry face;Brushing hair;Minimal assistance Grooming Details (indicate cue type and reason): CGA in sitting for balance to complete grooming tasks, MIN A for thoroughness with combing hair             Lower Body Dressing: Maximal assistance;Bed level                      Extremity/Trunk Assessment              Vision       Perception     Praxis     Communication Communication Communication: Impaired Factors Affecting Communication: Hearing impaired   Cognition Arousal: Alert Behavior During Therapy: Anxious Cognition: History of cognitive impairments  Following commands: Intact Following commands impaired: Follows one step commands with increased time      Cueing   Cueing Techniques: Verbal cues, Gestural cues, Tactile cues  Exercises      Shoulder Instructions       General Comments LLE hinged knee brace in place and locked in extension    Pertinent Vitals/ Pain       Pain Assessment Pain Assessment: 0-10 Pain Score: 8  Pain Location: L knee Pain Descriptors /  Indicators: Aching, Grimacing, Moaning, Guarding Pain Intervention(s): Limited activity within patient's tolerance, Monitored during session, Premedicated before session, Repositioned, Patient requesting pain meds-RN notified  Home Living                                          Prior Functioning/Environment              Frequency  Min 2X/week        Progress Toward Goals  OT Goals(current goals can now be found in the care plan section)  Progress towards OT goals: Progressing toward goals  Acute Rehab OT Goals Patient Stated Goal: to improve pain OT Goal Formulation: With patient/family Time For Goal Achievement: 05/17/23 Potential to Achieve Goals: Fair  Plan      Co-evaluation    PT/OT/SLP Co-Evaluation/Treatment: Yes Reason for Co-Treatment: Complexity of the patient's impairments (multi-system involvement);To address functional/ADL transfers PT goals addressed during session: Mobility/safety with mobility OT goals addressed during session: ADL's and self-care      AM-PAC OT "6 Clicks" Daily Activity     Outcome Measure   Help from another person eating meals?: None Help from another person taking care of personal grooming?: A Little Help from another person toileting, which includes using toliet, bedpan, or urinal?: A Lot Help from another person bathing (including washing, rinsing, drying)?: A Lot Help from another person to put on and taking off regular upper body clothing?: A Little Help from another person to put on and taking off regular lower body clothing?: A Lot 6 Click Score: 16    End of Session Equipment Utilized During Treatment: Rolling walker (2 wheels);Gait belt  OT Visit Diagnosis: Other abnormalities of gait and mobility (R26.89);Muscle weakness (generalized) (M62.81)   Activity Tolerance Patient limited by pain   Patient Left in bed;with call bell/phone within reach;with bed alarm set   Nurse Communication Mobility  status;Patient requests pain meds        Time: 1138-1201 OT Time Calculation (min): 23 min  Charges: OT General Charges $OT Visit: 1 Visit OT Treatments $Self Care/Home Management : 8-22 mins  Arman Filter., MPH, MS, OTR/L ascom [phone] 05/09/23, 1:35 PM

## 2023-05-10 DIAGNOSIS — N3000 Acute cystitis without hematuria: Secondary | ICD-10-CM | POA: Diagnosis not present

## 2023-05-10 DIAGNOSIS — I10 Essential (primary) hypertension: Secondary | ICD-10-CM | POA: Diagnosis not present

## 2023-05-10 DIAGNOSIS — S82142A Displaced bicondylar fracture of left tibia, initial encounter for closed fracture: Secondary | ICD-10-CM | POA: Diagnosis not present

## 2023-05-10 DIAGNOSIS — J449 Chronic obstructive pulmonary disease, unspecified: Secondary | ICD-10-CM | POA: Diagnosis not present

## 2023-05-10 NOTE — Plan of Care (Signed)

## 2023-05-10 NOTE — Progress Notes (Signed)
 Progress Note   Patient: Terri Braun WUJ:[id number] DOB: 09-Sep-1929 DOA: 05/03/2023     6 DOS: the patient was seen and examined on 05/10/2023   Brief hospital course: Taken from H&P.   Terri Braun is a 89 y.o. female with medical history significant of CAD status post stenting on Plavix, HTN, hypothyroidism, COPD, severe left knee OA, presented with recurrent fall and fracture left knee.   Patient lives alone in an independent living facility and is currently being worked up moving to ALF due to frequent falls.  On presentation hemodynamically stable,CT left knee showed acute complete minimally displaced fracture of proximal tibial diaphysis and acute nondisplaced fracture of proximal fibula metaphyseal region, severe OA left knee.   Orthopedic surgery was consulted and they were advising conservative management with hinged knee brace and outpatient follow-up.  4/3: Blood pressure elevated at 170/54, UA yesterday with moderate leukocytes and rare bacteria.  Rest of the labs stable.  Ordered urine culture and starting her on ceftriaxone.  Patient was having increased urinary frequency, progressive weakness and multiple recent falls.  4/4: Blood pressure mildly elevated.  Urine cultures with multiple species, based on her symptoms we decided to treat for 5 days.  Continuing ceftriaxone while in the hospital.  She was placed on hinged knee brace, locked in extension.  She will be nonweightbearing on left lower extremity for 6 to 8 weeks and follow-up with orthopedic surgery as outpatient. TOC is looking for placement.  4/5: Medically stable, awaiting SNF.  4/6: Added MiraLAX as still no bowel movement.  Otherwise stable for SNF  4/7: Hemodynamically stable.  Had a BM-awaiting placement.  4/8: Remains stable, awaiting SNF placement.  Completed the course of antibiotic.  4/9: Hemodynamically stable.  Had a bed offer-pending insurance authorization  Assessment and Plan: * Tibial plateau  fracture Left proximal fibula metaphyseal fracture, nondisplaced . Orthopedic surgery reviewed her case and recommended conservative management with a hinged left knee brace, pain management and PT. she will be nonweightbearing for 6 to 8 weeks on left lower extremity PT is recommending SNF -Continue with pain management -TOC to work on SNF placement  UTI (urinary tract infection) UA concerning for UTI.  Patient was having increased urinary frequency with nocturia, progressive weakness and recent multiple falls.  Urine culture with multiple species. - Completed the course of antibiotic  Essential hypertension Blood pressure mildly elevated -Continue home losartan and Imdur  COPD (chronic obstructive pulmonary disease) (HCC) No acute concern. -Continue home Trelegy -As needed albuterol  CAD (coronary artery disease) No chest pain.  Troponin negative -Continue home Imdur, Plavix and Crestor  GERD (gastroesophageal reflux disease) -Continue PPI   Subjective: Patient was seen and examined today.  Pain seems controlled on the current regimen.  Wants to get out of the hospital.  Physical Exam: Vitals:   05/09/23 1513 05/09/23 1943 05/10/23 0345 05/10/23 0729  BP: (!) 105/40 (!) 127/41 136/70 (!) 157/56  Pulse: 77 78 78 78  Resp: 15 17 18 16   Temp: 98.4 F (36.9 C) 98.2 F (36.8 C) 98.1 F (36.7 C) 97.8 F (36.6 C)  TempSrc:      SpO2: 98% 97% 96% 97%  Weight:      Height:       General.  Frail elderly lady, in no acute distress. Pulmonary.  Lungs clear bilaterally, normal respiratory effort. CV.  Regular rate and rhythm, no JVD, rub or murmur. Abdomen.  Soft, nontender, nondistended, BS positive. CNS.  Alert and oriented .  No focal neurologic deficit. Extremities.  No edema, no cyanosis, pulses intact and symmetrical.  Left knee with brace  Data Reviewed: Prior records reviewed  Family Communication: SIL at bedside  Disposition: Status is: Inpatient The patient  will require care spanning > 2 midnights and should be moved to inpatient because: Severity of illness  Planned Discharge Destination: Skilled nursing facility  DVT prophylaxis.  Lovenox Time spent: 40 minutes  This record has been created using Conservation officer, historic buildings. Errors have been sought and corrected,but may not always be located. Such creation errors do not reflect on the standard of care.   Author: Arnetha Courser, MD 05/10/2023 3:09 PM  For on call review [email or web address].

## 2023-05-10 NOTE — Plan of Care (Signed)
   Problem: Nutrition: Goal: Adequate nutrition will be maintained Outcome: Progressing

## 2023-05-10 NOTE — TOC Progression Note (Addendum)
 Transition of Care [facility]) - Progression Note    Patient Details  Name: Terri Braun MRN: [id number] Date of Birth: Sep 07, 1929  Transition of Care [facility]) CM/SW Contact  Marlowe Sax, RN Phone Number: 05/10/2023, 9:39 AM  Clinical Narrative:     [facility] accepted the patient, Ins Pending PlanAuthID: [phone]    Expected Discharge Plan: Skilled Nursing Facility Barriers to Discharge: SNF Pending bed offer, Insurance Authorization  Expected Discharge Plan and Services   Discharge Planning Services: CM Consult   Living arrangements for the past 2 months: Independent Living Facility                   DME Agency: NA       HH Arranged: NA           Social Determinants of Health (SDOH) Interventions SDOH Screenings   Food Insecurity: No Food Insecurity (05/03/2023)  Housing: Low Risk  (05/03/2023)  Transportation Needs: No Transportation Needs (05/03/2023)  Utilities: Not At Risk (05/03/2023)  Alcohol Screen: Low Risk  (09/29/2021)  Depression (PHQ2-9): Low Risk  (09/29/2021)  Financial Resource Strain: Low Risk  (09/29/2021)  Physical Activity: Insufficiently Active (09/29/2021)  Social Connections: Moderately Isolated (05/03/2023)  Stress: No Stress Concern Present (09/29/2021)  Tobacco Use: Low Risk  (05/03/2023)    Readmission Risk Interventions     No data to display

## 2023-05-10 NOTE — Progress Notes (Signed)
 Patient ID: Terri Braun, female   DOB: 07/20/1929, 89 y.o.   MRN: [id number]  Subjective: No new complaints.  The patient is quite pleasant and responds appropriately, but appears to be somewhat confused this morning.  She has no new complaints pertaining to her left knee or lower leg.   Objective: Vital signs in last 24 hours: Temp:  [97.8 F (36.6 C)-98.4 F (36.9 C)] 97.8 F (36.6 C) (04/09 0729) Pulse Rate:  [77-78] 78 (04/09 0729) Resp:  [15-18] 16 (04/09 0729) BP: (105-157)/(40-70) 157/56 (04/09 0729) SpO2:  [96 %-98 %] 97 % (04/09 0729)  Intake/Output from previous day: 04/08 0701 - 04/09 0700 In: 360 [P.O.:360] Out: 1050 [Urine:1050] Intake/Output this shift: No intake/output data recorded.  No results for input(s): "HGB" in the last 72 hours. No results for input(s): "WBC", "RBC", "HCT", "PLT" in the last 72 hours. No results for input(s): "NA", "K", "CL", "CO2", "BUN", "CREATININE", "GLUCOSE", "CALCIUM" in the last 72 hours. No results for input(s): "LABPT", "INR" in the last 72 hours.  Physical Exam: Orthopedic examination is limited to the left knee and lower extremity.  Her brace appears to be fitting well.  The swelling around her knee and proximal tibia appears to be improved.  There is some resolving ecchymosis in this area as well as moderate tenderness to palpation.  She continues to exhibit a 1-2+ effusion.  She is grossly neurovascularly intact to the left lower extremity and foot.  Assessment: 1.  Nondisplaced left proximal tib-fib fracture. 2.  Severe advanced tricompartmental degenerative joint disease left knee.  Plan: The treatment options have been reviewed with the patient.  The patient will continue to be treated nonsurgically in her hinged knee brace with the brace locked in extension.  She may be mobilized with physical therapy, but is to remain nonweightbearing on the left lower extremity for the next 6 to 8 weeks.  Therefore, she will require  skilled rehab placement during this time.  Thank you for asking me to participate in the care of this most pleasant yet unfortunate woman.  I will sign off at this time.  Please make arrangements for her to follow-up in our office for repeat x-rays of the left tibia in the next 3 to 4 weeks to assess her healing.  If you have further need of orthopedic input during this hospitalization, please reconsult me.   Excell Seltzer Cebastian Neis 05/10/2023, 7:47 AM

## 2023-05-10 NOTE — Plan of Care (Signed)
  Problem: Education: Goal: Knowledge of General Education information will improve Description: Including pain rating scale, medication(s)/side effects and non-pharmacologic comfort measures 05/10/2023 2125 by Rubbie Battiest D, RN Outcome: Progressing 05/10/2023 2125 by Raeanne Barry, RN Outcome: Progressing   Problem: Health Behavior/Discharge Planning: Goal: Ability to manage health-related needs will improve 05/10/2023 2125 by Raeanne Barry, RN Outcome: Progressing 05/10/2023 2125 by Rubbie Battiest D, RN Outcome: Progressing   Problem: Clinical Measurements: Goal: Ability to maintain clinical measurements within normal limits will improve 05/10/2023 2125 by Rubbie Battiest D, RN Outcome: Progressing 05/10/2023 2125 by Rubbie Battiest D, RN Outcome: Progressing Goal: Will remain free from infection 05/10/2023 2125 by Rubbie Battiest D, RN Outcome: Progressing 05/10/2023 2125 by Rubbie Battiest D, RN Outcome: Progressing Goal: Diagnostic test results will improve 05/10/2023 2125 by Rubbie Battiest D, RN Outcome: Progressing 05/10/2023 2125 by Rubbie Battiest D, RN Outcome: Progressing Goal: Respiratory complications will improve 05/10/2023 2125 by Rubbie Battiest D, RN Outcome: Progressing 05/10/2023 2125 by Rubbie Battiest D, RN Outcome: Progressing Goal: Cardiovascular complication will be avoided 05/10/2023 2125 by Rubbie Battiest D, RN Outcome: Progressing 05/10/2023 2125 by Raeanne Barry, RN Outcome: Progressing   Problem: Activity: Goal: Risk for activity intolerance will decrease 05/10/2023 2125 by Rubbie Battiest D, RN Outcome: Progressing 05/10/2023 2125 by Rubbie Battiest D, RN Outcome: Progressing   Problem: Nutrition: Goal: Adequate nutrition will be maintained 05/10/2023 2125 by Rubbie Battiest D, RN Outcome: Progressing 05/10/2023 2125 by Rubbie Battiest D, RN Outcome: Progressing   Problem: Coping: Goal: Level of anxiety will decrease 05/10/2023 2125 by Rubbie Battiest D, RN Outcome: Progressing 05/10/2023 2125 by Rubbie Battiest D, RN Outcome: Progressing   Problem: Elimination: Goal: Will not experience complications related to bowel motility 05/10/2023 2125 by Raeanne Barry, RN Outcome: Progressing 05/10/2023 2125 by Rubbie Battiest D, RN Outcome: Progressing Goal: Will not experience complications related to urinary retention 05/10/2023 2125 by Raeanne Barry, RN Outcome: Progressing 05/10/2023 2125 by Rubbie Battiest D, RN Outcome: Progressing   Problem: Pain Managment: Goal: General experience of comfort will improve and/or be controlled 05/10/2023 2125 by Rubbie Battiest D, RN Outcome: Progressing 05/10/2023 2125 by Rubbie Battiest D, RN Outcome: Progressing   Problem: Safety: Goal: Ability to remain free from injury will improve 05/10/2023 2125 by Raeanne Barry, RN Outcome: Progressing 05/10/2023 2125 by Rubbie Battiest D, RN Outcome: Progressing   Problem: Skin Integrity: Goal: Risk for impaired skin integrity will decrease 05/10/2023 2125 by Rubbie Battiest D, RN Outcome: Progressing 05/10/2023 2125 by Raeanne Barry, RN Outcome: Progressing

## 2023-05-11 DIAGNOSIS — S82142A Displaced bicondylar fracture of left tibia, initial encounter for closed fracture: Secondary | ICD-10-CM | POA: Diagnosis not present

## 2023-05-11 DIAGNOSIS — N3000 Acute cystitis without hematuria: Secondary | ICD-10-CM | POA: Diagnosis not present

## 2023-05-11 DIAGNOSIS — J449 Chronic obstructive pulmonary disease, unspecified: Secondary | ICD-10-CM | POA: Diagnosis not present

## 2023-05-11 DIAGNOSIS — I10 Essential (primary) hypertension: Secondary | ICD-10-CM | POA: Diagnosis not present

## 2023-05-11 MED ORDER — BISACODYL 5 MG PO TBEC: 5.0000 mg | DELAYED_RELEASE_TABLET | Freq: Every day | ORAL | Status: DC | PRN

## 2023-05-11 MED ORDER — HYDROCODONE-ACETAMINOPHEN 5-325 MG PO TABS
1.0000 | ORAL_TABLET | Freq: Four times a day (QID) | ORAL | 0 refills | Status: DC | PRN
Start: 2023-05-11 — End: 2023-11-28

## 2023-05-11 MED ORDER — POLYETHYLENE GLYCOL 3350 17 G PO PACK: 17.0000 g | PACK | Freq: Two times a day (BID) | ORAL | Status: DC

## 2023-05-11 MED ORDER — ENSURE ENLIVE PO LIQD: 237.0000 mL | Freq: Two times a day (BID) | ORAL | Status: DC

## 2023-05-11 MED ORDER — ADULT MULTIVITAMIN W/MINERALS CH: 1.0000 | ORAL_TABLET | Freq: Every day | ORAL | Status: DC

## 2023-05-11 NOTE — TOC Transition Note (Signed)
 Transition of Care [facility]) - Discharge Note   Patient Details  Name: Terri Braun MRN: [id number] Date of Birth: Jun 05, 1929  [facility]) CM/SW Contact:  Marlowe Sax, RN Phone Number: 05/11/2023, 12:15 PM   Clinical Narrative:     Called Daughter and let her know the room number and that the patient will [place] today to the [place] [facility] called to arrange transport    Barriers to Discharge: SNF Pending bed offer, Insurance Authorization   Patient Goals and CMS Choice Patient states their goals for this hospitalization and ongoing recovery are:: "To stop falling"          Discharge Placement                       Discharge Plan and Services Additional resources added to the After Visit Summary for     Discharge Planning Services: CM Consult              DME Agency: NA       HH Arranged: NA          Social Drivers of Health (SDOH) Interventions SDOH Screenings   Food Insecurity: No Food Insecurity (05/03/2023)  Housing: Low Risk  (05/03/2023)  Transportation Needs: No Transportation Needs (05/03/2023)  Utilities: Not At Risk (05/03/2023)  Alcohol Screen: Low Risk  (09/29/2021)  Depression (PHQ2-9): Low Risk  (09/29/2021)  Financial Resource Strain: Low Risk  (09/29/2021)  Physical Activity: Insufficiently Active (09/29/2021)  Social Connections: Moderately Isolated (05/03/2023)  Stress: No Stress Concern Present (09/29/2021)  Tobacco Use: Low Risk  (05/03/2023)     Readmission Risk Interventions     No data to display

## 2023-05-11 NOTE — TOC Progression Note (Signed)
 Transition of Care [facility]) - Progression Note    Patient Details  Name: Terri Braun MRN: [id number] Date of Birth: 1929/06/30  [facility]) CM/SW Contact  Marlowe Sax, RN Phone Number: 05/11/2023, 9:59 AM  Clinical Narrative:    Approved PlanAuthID:[id number] dates:4.9-4.11.25 next review date: 4.11.2025    Expected Discharge Plan: Skilled Nursing Facility Barriers to Discharge: SNF Pending bed offer, Insurance Authorization  Expected Discharge Plan and Services   Discharge Planning Services: CM Consult   Living arrangements for the past 2 months: Independent Living Facility                   DME Agency: NA       HH Arranged: NA           Social Determinants of Health (SDOH) Interventions SDOH Screenings   Food Insecurity: No Food Insecurity (05/03/2023)  Housing: Low Risk  (05/03/2023)  Transportation Needs: No Transportation Needs (05/03/2023)  Utilities: Not At Risk (05/03/2023)  Alcohol Screen: Low Risk  (09/29/2021)  Depression (PHQ2-9): Low Risk  (09/29/2021)  Financial Resource Strain: Low Risk  (09/29/2021)  Physical Activity: Insufficiently Active (09/29/2021)  Social Connections: Moderately Isolated (05/03/2023)  Stress: No Stress Concern Present (09/29/2021)  Tobacco Use: Low Risk  (05/03/2023)    Readmission Risk Interventions     No data to display

## 2023-05-11 NOTE — Discharge Summary (Signed)
 Physician Discharge Summary   Patient: Terri Braun MRN: [id number] DOB: [birth year]  Admit date:     05/03/2023  Discharge date: 05/11/23  Discharge Physician: Arnetha Courser   PCP: Bosie Clos, MD   Recommendations at discharge:  Please obtain CBC and BMP on follow-up Patient will be nonweightbearing on left lower extremity and knee should remain in extension with brace Follow-up with orthopedic surgery Follow-up with primary care provider  Discharge Diagnoses: Principal Problem:   Tibial plateau fracture Active Problems:   UTI (urinary tract infection)   Essential hypertension   COPD (chronic obstructive pulmonary disease) (HCC)   CAD (coronary artery disease)   GERD (gastroesophageal reflux disease)   Tibial fracture   Hospital Course: Taken from H&P.   Terri Braun is a 89 y.o. female with medical history significant of CAD status post stenting on Plavix, HTN, hypothyroidism, COPD, severe left knee OA, presented with recurrent fall and fracture left knee.   Patient lives alone in an independent living facility and is currently being worked up moving to ALF due to frequent falls.  On presentation hemodynamically stable,CT left knee showed acute complete minimally displaced fracture of proximal tibial diaphysis and acute nondisplaced fracture of proximal fibula metaphyseal region, severe OA left knee.   Orthopedic surgery was consulted and they were advising conservative management with hinged knee brace and outpatient follow-up.  4/3: Blood pressure elevated at 170/54, UA yesterday with moderate leukocytes and rare bacteria.  Rest of the labs stable.  Ordered urine culture and starting her on ceftriaxone.  Patient was having increased urinary frequency, progressive weakness and multiple recent falls.  4/4: Blood pressure mildly elevated.  Urine cultures with multiple species, based on her symptoms we decided to treat for 5 days.  Continuing ceftriaxone while in the  hospital.  She was placed on hinged knee brace, locked in extension.  She will be nonweightbearing on left lower extremity for 6 to 8 weeks and follow-up with orthopedic surgery as outpatient. TOC is looking for placement.  4/10: Patient remained hemodynamically stable.  Completed the course of antibiotic for concern of UTI.  Discharge got delayed due to SNF bed search and insurance authorization which was obtained today.  Pain seems improving but continue to require pain medications.  Patient will be nonweightbearing for 6 to 8 weeks on left lower extremity.  She should continue with knee brace in extension.  While taking pain medications patient did developed constipation and currently having bowel movements with twice daily MiraLAX.  Please avoid constipation.  You can titrate the dose of MiraLAX as needed.  If she develops diarrhea please back off.  Please try simple Tylenol and Celebrex before giving opioids.  Patient need to follow-up with orthopedic surgery as outpatient for further recommendations.  She will continue on current medications and follow-up with her providers for further assistance.  Assessment and Plan: * Tibial plateau fracture Left proximal fibula metaphyseal fracture, nondisplaced . Orthopedic surgery reviewed her case and recommended conservative management with a hinged left knee brace, pain management and PT. she will be nonweightbearing for 6 to 8 weeks on left lower extremity PT is recommending SNF -Continue with pain management -TOC to work on SNF placement  UTI (urinary tract infection) UA concerning for UTI.  Patient was having increased urinary frequency with nocturia, progressive weakness and recent multiple falls.  Urine culture with multiple species. - Completed the course of antibiotic  Essential hypertension Blood pressure mildly elevated -Continue home losartan and Imdur  COPD (chronic  obstructive pulmonary disease) (HCC) No acute  concern. -Continue home Trelegy -As needed albuterol  CAD (coronary artery disease) No chest pain.  Troponin negative -Continue home Imdur, Plavix and Crestor  GERD (gastroesophageal reflux disease) -Continue PPI      Pain control - [place] Controlled Substance Reporting System database was reviewed. and patient was instructed, not to drive, operate heavy machinery, perform activities at heights, swimming or participation in water activities or provide baby-sitting services while on Pain, Sleep and Anxiety Medications; until their outpatient Physician has advised to do so again. Also recommended to not to take more than prescribed Pain, Sleep and Anxiety Medications.  Consultants: Orthopedic surgery Procedures performed: None Disposition: Skilled nursing facility Diet recommendation:  Discharge Diet Orders (From admission, onward)     Start     Ordered   05/11/23 0000  Diet - low sodium heart healthy        05/11/23 1038           Cardiac diet DISCHARGE MEDICATION: Allergies as of 05/11/2023       Reactions   Shellfish Allergy Anaphylaxis   Ended up in the ED after eating shellfish, caused diarrhea and severe GI upset , unsure if caused SOB   Shellfish-derived Products    Other reaction(s): Not available   Sulfa Antibiotics         Medication List     STOP taking these medications    omeprazole 20 MG capsule Commonly known as: PRILOSEC   oxyCODONE 5 MG immediate release tablet Commonly known as: Roxicodone   Premarin vaginal cream Generic drug: conjugated estrogens       TAKE these medications    acetaminophen 500 MG tablet Commonly known as: TYLENOL Take 500 mg by mouth every 4 (four) hours as needed.   albuterol 108 (90 Base) MCG/ACT inhaler Commonly known as: VENTOLIN HFA TAKE 2 PUFFS BY MOUTH EVERY 6 HOURS AS NEEDED FOR WHEEZE OR SHORTNESS OF BREATH   alendronate 70 MG tablet Commonly known as: FOSAMAX alendronate 70 mg tablet  TAKE  1 TABLET BY MOUTH ONCE WEEKLY   bisacodyl 5 MG EC tablet Commonly known as: DULCOLAX Take 1 tablet (5 mg total) by mouth daily as needed for moderate constipation.   celecoxib 100 MG capsule Commonly known as: CELEBREX TAKE 1 CAPSULE BY MOUTH EVERY DAY What changed:  how much to take additional instructions   clopidogrel 75 MG tablet Commonly known as: PLAVIX TAKE 1 TABLET BY MOUTH EVERY DAY   diphenhydramine-acetaminophen 25-500 MG Tabs tablet Commonly known as: TYLENOL PM Take 1 tablet by mouth at bedtime as needed.   DULoxetine 60 MG capsule Commonly known as: CYMBALTA Take 60 mg by mouth daily.   feeding supplement Liqd Take 237 mLs by mouth 2 (two) times daily between meals.   HYDROcodone-acetaminophen 5-325 MG tablet Commonly known as: NORCO/VICODIN Take 1-2 tablets by mouth every 6 (six) hours as needed for moderate pain (pain score 4-6).   isosorbide mononitrate 30 MG 24 hr tablet Commonly known as: IMDUR Take 1 tablet (30 mg total) by mouth daily.   lidocaine 5 % Commonly known as: LIDODERM Place 1 patch onto the skin daily.   losartan 50 MG tablet Commonly known as: COZAAR Take 50 mg by mouth daily.   multivitamin with minerals Tabs tablet Take 1 tablet by mouth daily. Start taking on: May 12, 2023   nitroGLYCERIN 0.4 MG SL tablet Commonly known as: NITROSTAT Place 1 tablet (0.4 mg total) under the tongue  every 5 (five) minutes as needed for chest pain.   pantoprazole 20 MG tablet Commonly known as: PROTONIX Take 20 mg by mouth daily.   polyethylene glycol 17 g packet Commonly known as: MIRALAX / GLYCOLAX Take 17 g by mouth 2 (two) times daily.   rosuvastatin 10 MG tablet Commonly known as: CRESTOR TAKE 1 TABLET BY MOUTH EVERY DAY   Trelegy Ellipta 100-62.5-25 MCG/ACT Aepb Generic drug: Fluticasone-Umeclidin-Vilant TAKE 1 PUFF BY MOUTH EVERY DAY        Follow-up Information     Bosie Clos, MD. Schedule an appointment as soon  as possible for a visit in 1 week(s).   Specialty: Family Medicine Contact information: [address] [place] [address] [phone]         Christena Flake, MD. Schedule an appointment as soon as possible for a visit in 2 week(s).   Specialty: Orthopedic Surgery Contact information: [address] [facility] [place] [address] [phone]                Discharge Exam: Ceasar Mons Weights   05/02/23 2254 05/08/23 1149  Weight: 63.5 kg 64.6 kg   General.  Frail elderly lady, in no acute distress. Pulmonary.  Lungs clear bilaterally, normal respiratory effort. CV.  Regular rate and rhythm, no JVD, rub or murmur. Abdomen.  Soft, nontender, nondistended, BS positive. CNS.  Alert and oriented .  No focal neurologic deficit. Extremities.  No edema, no cyanosis, pulses intact and symmetrical.  Left lower extremity with knee brace Psychiatry.  Judgment and insight appears normal.   Condition at discharge: stable  The results of significant diagnostics from this hospitalization (including imaging, microbiology, ancillary and laboratory) are listed below for reference.   Imaging Studies: CT Knee Left Wo Contrast Result Date: 05/03/2023 CLINICAL DATA:  Fall, left knee injury, tibial plateau fracture EXAM: CT OF THE LEFT KNEE WITHOUT CONTRAST TECHNIQUE: Multidetector CT imaging of the left knee was performed according to the standard protocol. Multiplanar CT image reconstructions were also generated. RADIATION DOSE REDUCTION: This exam was performed according to the departmental dose-optimization program which includes automated exposure control, adjustment of the mA and/or kV according to patient size and/or use of iterative reconstruction technique. COMPARISON:  None Available. FINDINGS: Bones/Joint/Cartilage Severe tricompartmental degenerate arthritis the left knee is noted with lateral subluxation the tibia and remodeling medial tibial plateau likely  representing a combination degenerative change and possible injury. Multiple intra-articular loose bodies are identified within the medial gutter and within the posterior recess. Posterior loose body likely represents an ununited remote posterior medial tibial plateau fracture fragment. There is resultant severe varus angulation the left knee by approximately 30-35%. There is an acute fracture of the proximal tibial diaphysis with minimal displacement. The fracture does appear complete with both transverse and longitudinal fracture planes, however, fracture fragments are in anatomic alignment. There is no intra-articular component of the fracture. There is, additionally, acute, nondisplaced fracture of the proximal fibular metaphyseal region best seen on sagittal image # 83/7 with fracture fragments in anatomic alignment. Ligaments Suboptimally assessed by CT. Muscles and Tendons Moderate fatty atrophy of the proximal left lower extremity musculature. Extensor mechanism intact. Soft tissues Large left knee effusion is present. Subcutaneous infiltration is seen within the proximal left lower extremity adjacent to the fracture plane. IMPRESSION: 1. Acute, complete, minimally displaced fracture of the proximal tibial diaphysis. 2. Acute, nondisplaced fracture of the proximal fibular metaphyseal region. 3. Severe tricompartmental degenerate arthritis of the left  knee with lateral subluxation the tibia and remodeling of the medial tibial plateau likely representing a combination of degenerative change and possible injury. Multiple intra-articular loose bodies. 4. Large left knee effusion. Electronically Signed   By: Helyn Numbers M.D.   On: 05/03/2023 00:28   DG Knee Complete 4 Views Left Result Date: 05/02/2023 CLINICAL DATA:  knee pain EXAM: LEFT KNEE - COMPLETE 4+ VIEW COMPARISON:  None Available. FINDINGS: Acute nondisplaced metadiaphysis tibial fracture. Acute nondisplaced fibular neck fracture. Severe  tricompartmental degenerative changes of the knee. No aggressive appearing focal bone abnormality. Subcutaneus soft tissue edema. IMPRESSION: 1. Acute nondisplaced metadiaphysis tibial fracture. 2. Acute nondisplaced fibular neck fracture. 3. Severe tricompartmental degenerative changes of the knee. Electronically Signed   By: Tish Frederickson M.D.   On: 05/02/2023 23:27   CT Cervical Spine Wo Contrast Result Date: 04/26/2023 CLINICAL DATA:  Neck trauma (Age >= 65y) EXAM: CT CERVICAL SPINE WITHOUT CONTRAST TECHNIQUE: Multidetector CT imaging of the cervical spine was performed without intravenous contrast. Multiplanar CT image reconstructions were also generated. RADIATION DOSE REDUCTION: This exam was performed according to the departmental dose-optimization program which includes automated exposure control, adjustment of the mA and/or kV according to patient size and/or use of iterative reconstruction technique. COMPARISON:  04/24/2022 FINDINGS: Alignment: Facet joints are aligned without dislocation or traumatic listhesis. Dens and lateral masses are aligned. Skull base and vertebrae: No acute fracture. No primary bone lesion or focal pathologic process. Soft tissues and spinal canal: No prevertebral fluid or swelling. No visible canal hematoma. Disc levels: Degenerative spondylosis, most pronounced at the C5-6 and C6-7 levels. Bulky endplate osteophytes at both levels. Upper chest: Biapical pleuroparenchymal scarring. Similar appearance of right upper lobe bronchiectasis. Other: Bilateral carotid atherosclerosis. IMPRESSION: 1. No acute fracture or traumatic listhesis of the cervical spine. 2. Degenerative spondylosis, most pronounced at the C5-6 and C6-7 levels. Electronically Signed   By: Duanne Guess D.O.   On: 04/26/2023 14:58   CT HEAD WO CONTRAST ( ) Result Date: 04/26/2023 CLINICAL DATA:  Head trauma, minor (Age >= 65y) EXAM: CT HEAD WITHOUT CONTRAST TECHNIQUE: Contiguous axial images were  obtained from the base of the skull through the vertex without intravenous contrast. RADIATION DOSE REDUCTION: This exam was performed according to the departmental dose-optimization program which includes automated exposure control, adjustment of the mA and/or kV according to patient size and/or use of iterative reconstruction technique. COMPARISON:  04/24/2022 FINDINGS: Brain: No evidence of acute infarction, hemorrhage, hydrocephalus, extra-axial collection or mass lesion/mass effect. Patchy low-density changes within the periventricular and subcortical white matter most compatible with chronic microvascular ischemic change. Mild diffuse cerebral volume loss. Vascular: Atherosclerotic calcifications involving the large vessels of the skull base. No unexpected hyperdense vessel. Skull: Normal. Negative for fracture or focal lesion. Sinuses/Orbits: No acute finding. Other: Negative for scalp hematoma. IMPRESSION: 1. No acute intracranial findings. 2. Chronic microvascular ischemic change and cerebral volume loss. Electronically Signed   By: Duanne Guess D.O.   On: 04/26/2023 14:55   CT PELVIS WO CONTRAST Result Date: 04/26/2023 CLINICAL DATA:  Foreign body suspected, pelvis, neg xray. Fall. Left hip pain. EXAM: CT PELVIS WITHOUT CONTRAST TECHNIQUE: Multidetector CT imaging of the pelvis was performed following the standard protocol without intravenous contrast. RADIATION DOSE REDUCTION: This exam was performed according to the departmental dose-optimization program which includes automated exposure control, adjustment of the mA and/or kV according to patient size and/or use of iterative reconstruction technique. COMPARISON:  None Available. FINDINGS: Urinary Tract: No abnormality visualized.  Unremarkable urinary bladder. Bowel: No disproportionate dilation of the visualized small or large bowel loops. No evidence of abnormal bowel wall thickening or inflammatory changes. There are multiple diverticula mainly  in the sigmoid colon, without imaging signs of diverticulitis. Vascular/Lymphatic: No ascites or pneumoperitoneum. No pelvic lymphadenopathy, by size criteria. No aneurysmal dilation of the major arteries. There are moderate peripheral atherosclerotic vascular calcifications of the aorta and its major branches. Reproductive: The uterus is surgically absent. No large adnexal mass. Other: The visualized soft tissues and abdominal wall are unremarkable. Musculoskeletal: No suspicious osseous lesions. No acute fracture or dislocation. No aggressive osseous lesion. IMPRESSION: 1. No acute inflammatory process identified within the pelvis. No radiodense foreign body seen. 2. No acute fracture or dislocation. 3. Multiple other nonacute observations, as described above. Aortic Atherosclerosis (ICD10-I70.0). Electronically Signed   By: Jules Schick M.D.   On: 04/26/2023 14:36   DG Foot Complete Right Result Date: 04/26/2023 CLINICAL DATA:  Fall.  Right foot pain and bruising. EXAM: RIGHT FOOT COMPLETE - 3+ VIEW COMPARISON:  Right foot radiographs 12/25/2018 FINDINGS: Interval healing of the previously seen fracture of the distal second metatarsal shaft. Cortical thickening indicating a new but still chronic/remote healed fracture of the distal third metatarsal shaft. New healed fracture of the 5th distal metatarsal neck with mild medial apex angulation. Unchanged mild cortical thickening of the medial aspect of the second metatarsal midshaft, a possible remote stress fracture. Moderate to high-grade hallux valgus. Within the limitation of diffuse decreased bone mineralization, no definite acute fracture is identified. Small plantar and posterior calcaneal heel spurs. Mild dorsal talonavicular degenerative osteophytosis. IMPRESSION: 1. No definite acute fracture is identified. 2. Interval healing of the previously seen fracture of the distal second metatarsal shaft. 3. New but still chronic/remote healed fracture of the  distal third metatarsal shaft. 4. New but healed fracture of the 5th distal metatarsal neck with mild medial apex angulation. 5. Moderate to high-grade hallux valgus. Electronically Signed   By: Neita Garnet M.D.   On: 04/26/2023 12:09   DG Knee Complete 4 Views Left Result Date: 04/26/2023 CLINICAL DATA:  Left knee pain after fall. EXAM: LEFT KNEE - COMPLETE 4+ VIEW COMPARISON:  Left knee radiographs 04/30/2021 FINDINGS: Severe medial compartment joint space narrowing with bone-on-bone contact, subchondral sclerosis, and large peripheral osteophytes. There is again moderate medial downsloping of the medial tibial plateau. A 2.3 cm chronic ossicle is again seen medial to the medial compartment. Moderate peripheral lateral compartment degenerative osteophytosis. Severe patellofemoral joint space narrowing and peripheral osteophytosis. Moderate to high-grade proximal posterior tibial degenerative osteophytes. Small joint effusion. Mild-to-moderate chronic enthesopathic change at the quadriceps insertion on the patella. No acute fracture or dislocation. IMPRESSION: 1. Severe medial and patellofemoral compartment osteoarthritis. 2. Small joint effusion. Electronically Signed   By: Neita Garnet M.D.   On: 04/26/2023 12:05   DG Hip Unilat W or Wo Pelvis 2-3 Views Left Result Date: 04/26/2023 CLINICAL DATA:  Fall onto side.  Left hip pain. EXAM: DG HIP (WITH OR WITHOUT PELVIS) 2-3V LEFT COMPARISON:  AP pelvis 04/24/2022 FINDINGS: Mild-to-moderate bilateral superior femoroacetabular joint space narrowing. Mild pubic symphysis joint space narrowing with subchondral sclerosis and mild peripheral osteophytosis. There is a linear lucency overlying the superior aspect of the left greater trochanter that is favored to represent overlying soft tissue markings rather than an acute fracture. Recommend clinical correlation for point tenderness within the superior aspect of the greater trochanter to assess for a possible acute  nondisplaced fracture. The  limitation of diffuse decreased bone mineralization, no definite acute fracture is seen. IMPRESSION: 1. Mild-to-moderate bilateral femoroacetabular osteoarthritis. 2. Linear lucency overlying the superior aspect of the left proximal femoral greater trochanter is favored to represent overlying soft tissue markings rather than an acute fracture. Recommend clinical correlation for point tenderness within the superior aspect of the greater trochanter to assess for a possible acute nondisplaced fracture. Electronically Signed   By: Neita Garnet M.D.   On: 04/26/2023 12:03   DG Hand Complete Right Result Date: 04/26/2023 CLINICAL DATA:  Fall landing on side. Right ring finger small cut on anterior surface at distal tip of finger. EXAM: RIGHT HAND - COMPLETE 3+ VIEW COMPARISON:  None Available. FINDINGS: There is diffuse decreased bone mineralization. A metallic ring obscures portions of the proximal phalanx of the fourth finger. Partial visualization of plate and screw fixation of the distal ulnar diaphysis, extending off the proximal plane of view. 2 mm ulnar negative variance. Severe capitate-lunate joint space narrowing with bone-on-bone contact and subchondral sclerosis. Moderate to severe triscaphe and moderate thumb radiocarpal joint space narrowing. Moderate triscaphe subchondral sclerosis and peripheral osteophytosis mild thumb carpometacarpal peripheral osteophytosis. Severe third and moderate to severe second DIP and moderate thumb interphalangeal joint space narrowing. No acute fracture is seen.  No dislocation. IMPRESSION: 1. No acute fracture is seen. 2. Moderate to severe capitate-lunate and triscaphe osteoarthritis. 3. Severe third and moderate to severe second DIP joint space narrowing. Electronically Signed   By: Neita Garnet M.D.   On: 04/26/2023 11:52    Microbiology: Results for orders placed or performed during the hospital encounter of 05/03/23  Urine Culture (for  pregnant, neutropenic or urologic patients or patients with an indwelling urinary catheter)     Status: Abnormal   Collection Time: 05/03/23  1:22 PM   Specimen: Urine, Clean Catch  Result Value Ref Range Status   Specimen Description   Final    URINE, CLEAN CATCH Performed at [facility], [address], [place], [place] [address]    Special Requests   Final    NONE Performed at [facility], [address] [place], [place] [address]    Culture MULTIPLE SPECIES PRESENT, SUGGEST RECOLLECTION (A)  Final   Report Status 05/05/2023 FINAL  Final    Labs: CBC: No results for input(s): "WBC", "NEUTROABS", "HGB", "HCT", "MCV", "PLT" in the last 168 hours. Basic Metabolic Panel: No results for input(s): "NA", "K", "CL", "CO2", "GLUCOSE", "BUN", "CREATININE", "CALCIUM", "MG", "PHOS" in the last 168 hours. Liver Function Tests: No results for input(s): "AST", "ALT", "ALKPHOS", "BILITOT", "PROT", "ALBUMIN" in the last 168 hours. CBG: No results for input(s): "GLUCAP" in the last 168 hours.  Discharge time spent: greater than 30 minutes.  This record has been created using Conservation officer, historic buildings. Errors have been sought and corrected,but may not always be located. Such creation errors do not reflect on the standard of care.   Signed: Arnetha Courser, MD [facility] 05/11/2023

## 2023-05-12 DIAGNOSIS — R296 Repeated falls: Secondary | ICD-10-CM | POA: Insufficient documentation

## 2023-10-31 NOTE — Progress Notes (Signed)
 Terri Braun is a 89 y.o. female seen for follow up visit at [facility] place  CHIEF COMPLAINT:  Follow up medical problems    HISTORY OF PRESENT ILLNESS:       Moderate persistent asthma with acute exacerbation (HHS-HCC) Persistent cough for a week or so, no cp but mild sob and very frequent cough despite codiene based cough syrup etc  No fever chills sweats are noted, no nausea vomiting or diarrhea have been noted, no chest pain or sob either       Social History   Socioeconomic History  . Marital status: Widowed  Tobacco Use  . Smoking status: Never  . Smokeless tobacco: Never  Substance and Sexual Activity  . Alcohol use: Never  . Drug use: Never   Social Drivers of Corporate investment banker Strain: Low Risk  (08/11/2023)   Overall Financial Resource Strain (CARDIA)   . Difficulty of Paying Living Expenses: Not very hard  Food Insecurity: No Food Insecurity (08/11/2023)   Hunger Vital Sign   . Worried About Programme researcher, broadcasting/film/video in the Last Year: Never true   . Ran Out of Food in the Last Year: Never true  Transportation Needs: No Transportation Needs (08/11/2023)   PRAPARE - Transportation   . Lack of Transportation (Medical): No   . Lack of Transportation (Non-Medical): No  Physical Activity: Insufficiently Active (09/29/2021)   Received from [facility]   Exercise Vital Sign   . On average, how many days per week do you engage in moderate to strenuous exercise (like a brisk walk)?: 2 days   . On average, how many minutes do you engage in exercise at this level?: 60 min  Stress: No Stress Concern Present (09/29/2021)   Received from [facility] - Occupational Stress Questionnaire   . Feeling of Stress : Only a little  Social Connections: Moderately Isolated (05/03/2023)   Received from [facility]   Social Connection and Isolation Panel   . In a typical week, how many times do you talk on the phone with family,  friends, or neighbors?: More than three times a week   . How often do you get together with friends or relatives?: More than three times a week   . How often do you attend church or religious services?: More than 4 times per year   . Do you belong to any clubs or organizations such as church groups, unions, fraternal or athletic groups, or school groups?: No   . How often do you attend meetings of the clubs or organizations you belong to?: Never   . Are you married, widowed, divorced, separated, never married, or living with a partner?: Widowed  Housing Stability: Low Risk  (08/11/2023)   Housing Stability Vital Sign   . Unable to Pay for Housing in the Last Year: No   . Number of Times Moved in the Last Year: 1   . Homeless in the Last Year: No     Medication reviewed and signed on the nursing facility chart as appropriate   view all Weight: 148.9 Lbs 10/03/2023 16:33  Kimberly Mayfield (Manual)  view all Blood Pressure: 121 /  52 mmHg 10/27/2023 06:00  /  Jon Corolla (Manual)  view all Temperature: 98.9 F 10/30/2023 07:56   Kimberly Mayfield (Manual)  view all Pulse: 93 bpm 10/27/2023 06:00   Angela Moreland (Manual)  view all Respirations: 20 Breaths/min 10/27/2023 06:00   Angela Moreland (Manual)  view all Blood Sugar:        view all O2 Saturation: 90.0% 10/27/2023 06:00   Angela Moreland (Manual)  view all Height: 65.0 Inches 10/24/2023 12:58   Sharlet Molt (Manual)  view all Pain Level: 2 10/31/2023 01:59  No acute distress No icterus  No JVD Lungs; frequent cough, making hard to evaluate, nad, diffuse wheezing  Heart; Regular rate and rhythm  Abdomen; Soft and flat, normal bowel sounds Extremities; No clubbing, cyanosis or edema  Available labs reviewed in the nursing home chart.   ASSESSMENT  AND PLAN: Diagnoses and all orders for this visit:  Moderate persistent asthma with acute exacerbation (HHS-HCC) Assessment & Plan: Persistent cough for a week or so, no cp but  mild sob and very frequent cough despite codiene based cough syrup etc     Add prednisone 40 mg for 6 days and amoxicillin  bid

## 2023-11-07 ENCOUNTER — Ambulatory Visit: Admitting: Pulmonary Disease

## 2023-11-07 ENCOUNTER — Encounter: Payer: Self-pay | Admitting: Pulmonary Disease

## 2023-11-07 ENCOUNTER — Other Ambulatory Visit
Admission: RE | Admit: 2023-11-07 | Discharge: 2023-11-07 | Disposition: A | Discharge status: Home / Self Care | Source: Ambulatory Visit | Attending: Pulmonary Disease | Admitting: Pulmonary Disease

## 2023-11-07 ENCOUNTER — Ambulatory Visit: Payer: Self-pay | Admitting: Pulmonary Disease

## 2023-11-07 VITALS — BP 120/58 | HR 93 | Ht 65.0 in | Wt 142.0 lb

## 2023-11-07 DIAGNOSIS — I2694 Multiple subsegmental pulmonary emboli without acute cor pulmonale: Secondary | ICD-10-CM | POA: Diagnosis not present

## 2023-11-07 DIAGNOSIS — R053 Chronic cough: Secondary | ICD-10-CM

## 2023-11-07 DIAGNOSIS — I2699 Other pulmonary embolism without acute cor pulmonale: Secondary | ICD-10-CM | POA: Diagnosis not present

## 2023-11-07 DIAGNOSIS — J4540 Moderate persistent asthma, uncomplicated: Secondary | ICD-10-CM | POA: Insufficient documentation

## 2023-11-07 DIAGNOSIS — J45909 Unspecified asthma, uncomplicated: Secondary | ICD-10-CM

## 2023-11-07 DIAGNOSIS — J479 Bronchiectasis, uncomplicated: Secondary | ICD-10-CM

## 2023-11-07 DIAGNOSIS — R0602 Shortness of breath: Secondary | ICD-10-CM | POA: Insufficient documentation

## 2023-11-07 DIAGNOSIS — R052 Subacute cough: Secondary | ICD-10-CM | POA: Insufficient documentation

## 2023-11-07 DIAGNOSIS — R791 Abnormal coagulation profile: Secondary | ICD-10-CM | POA: Diagnosis not present

## 2023-11-07 DIAGNOSIS — R600 Localized edema: Secondary | ICD-10-CM

## 2023-11-07 DIAGNOSIS — R7989 Other specified abnormal findings of blood chemistry: Secondary | ICD-10-CM

## 2023-11-07 DIAGNOSIS — S82142S Displaced bicondylar fracture of left tibia, sequela: Secondary | ICD-10-CM

## 2023-11-07 DIAGNOSIS — E8801 Alpha-1-antitrypsin deficiency: Secondary | ICD-10-CM

## 2023-11-07 LAB — CBC WITH DIFFERENTIAL/PLATELET
Abs Immature Granulocytes: 0.27 K/uL — ABNORMAL HIGH (ref 0.00–0.07)
Basophils Absolute: 0 K/uL (ref 0.0–0.1)
Basophils Relative: 0 %
Eosinophils Absolute: 0.1 K/uL (ref 0.0–0.5)
Eosinophils Relative: 1 %
HCT: 30.6 % — ABNORMAL LOW (ref 36.0–46.0)
Hemoglobin: 9.6 g/dL — ABNORMAL LOW (ref 12.0–15.0)
Immature Granulocytes: 2 %
Lymphocytes Relative: 6 %
Lymphs Abs: 0.8 K/uL (ref 0.7–4.0)
MCH: 27.9 pg (ref 26.0–34.0)
MCHC: 31.4 g/dL (ref 30.0–36.0)
MCV: 89 fL (ref 80.0–100.0)
Monocytes Absolute: 0.7 K/uL (ref 0.1–1.0)
Monocytes Relative: 5 %
Neutro Abs: 12.2 K/uL — ABNORMAL HIGH (ref 1.7–7.7)
Neutrophils Relative %: 86 %
Platelets: 679 K/uL — ABNORMAL HIGH (ref 150–400)
RBC: 3.44 MIL/uL — ABNORMAL LOW (ref 3.87–5.11)
RDW: 16.5 % — ABNORMAL HIGH (ref 11.5–15.5)
WBC: 14.1 K/uL — ABNORMAL HIGH (ref 4.0–10.5)
nRBC: 0 % (ref 0.0–0.2)

## 2023-11-07 LAB — D-DIMER, QUANTITATIVE: D-Dimer, Quant: 8.61 ug{FEU}/mL — ABNORMAL HIGH (ref 0.00–0.50)

## 2023-11-07 MED ORDER — AZITHROMYCIN 250 MG PO TABS: ORAL_TABLET | ORAL | 0 refills | Status: DC

## 2023-11-07 NOTE — Patient Instructions (Signed)
 VISIT SUMMARY:  During your visit, we discussed your persistent cough and asthma management. Your cough has been ongoing for two to three weeks, and despite using your asthma inhalers and taking medications, it has not improved significantly. We also reviewed your recent chest x-ray and medical history.  YOUR PLAN:  -POSTINFECTIOUS COUGH: A postinfectious cough is a cough that persists after a respiratory infection, such as a cold. It can last for 8 to 12 weeks. We will order a chest x-ray to rule out other causes and continue your current cough syrup with codeine at night and a non-codeine cough syrup during the day. We will also check your blood for the likelihood of a clot.  -ASTHMA: Asthma is a condition where your airways narrow and swell, making it difficult to breathe. Your current inhaler regimen is effective, and you should continue using your rescue inhaler (albuterol ) as needed during coughing episodes.  INSTRUCTIONS:  Please follow up after completing the chest x-ray and blood test. Continue using your inhalers as prescribed and alternate between the codeine and non-codeine cough syrups as discussed.

## 2023-11-07 NOTE — Progress Notes (Signed)
 Subjective:    Patient ID: Terri Braun, female    DOB: 1929/09/03, 89 y.o.   MRN: [id number]  Patient Care Team: Bertrum Charlie CROME, MD as PCP - General (Family Medicine) Perla Evalene PARAS, MD as PCP - Cardiology (Cardiology) Lenon Layman ORN, MD as Referring Physician (Internal Medicine)  Chief Complaint  Patient presents with   Cough    Present 2-3 weeks    BACKGROUND/INTERVAL:Patient is a 89 year old lifelong never smoker who presents follows here for shortness of breath which comes off and on.  I last saw the patient on 09 February 2023.  She has moderate persistent asthma, bronchiectasis hiatal hernia with reflux and alpha-1 phenotype MZ with normal alpha-1 level.  Today she presents as an ACUTE visit due to a cough that has been present for 2 to 3 weeks.  HPI Discussed the use of AI scribe software for clinical note transcription with the patient, who gave verbal consent to proceed.  History of Present Illness   Terri Braun is a 89 year old female with asthma who presents with a cough.  She presents with her daughter Terri Braun.  She has been experiencing a persistent cough for the past two to three weeks, initially accompanied by symptoms of a cold and chest congestion. Despite regular use of her asthma inhalers, the cough has worsened, causing significant discomfort and impacting her sleep.  She has been treated with several cough medicines, including a syrup with codeine, which has provided some relief, particularly at night. She is currently on her second round of prednisone. A chest x-ray performed a couple of weeks ago showed no signs of pneumonia per report. She has also completed a course of amoxicillin , prescribed to address any potential bacterial infection.  Her medical history includes a left leg fracture with poor healing, contributing to her immobility.  This occurred around April.  She spends a significant amount of time in bed due to her immobility. She  uses a rescue inhaler, albuterol , as needed for her asthma symptoms.  She has noted increased shortness of breath and actually appears somewhat tachypneic during her visit today which is unusual for her.  She is currently in a skilled nursing facility (the [address] of [place]) due to her issues with left tibial plateau fracture which appears to be healing poorly.  During the review of symptoms, she confirmed the presence of a cough, cold, and chest congestion.  Per her daughter, her caregivers have been diligent in ensuring she uses her inhalers appropriately.   SABRAAppears to be somewhat confused today.      DATA 03/20/2018 PFTs: FEV1 1.94 L or 104% predicted, FVC 2.47 L or 100% predicted, FEV1/FVC 76%,no bronchodilator response. 03/20/2018 chest x-ray: On my review there is hyperinflation, otherwise, no acute disease. 01/20/2022 alpha-1 phenotype: MZ, level 120 mg/dL 96/72/7975 echocardiogram: LVEF 60 to 65%, no regional wall motion abnormalities, RV function normal.  Left atrial size mildly dilated, no evidence of mitral regurgitation, mild aortic regurgitation.  No significant change from prior study performed 2022 05/04/2022 CT chest: Areas of bronchiectasis most significant on the right upper lobe and right middle lobe, diffuse tree-in-bud process most significantly in the right upper lobe and right middle lobe likely reflecting atypical infection such as MAC, no worrisome pulmonary lesions, reactive borderline mediastinal hilar lymph nodes.  Cirrhotic changes involving the liver but no hepatic lesions.  There is also a significant hiatal hernia.    Review of Systems A 10 point review of systems was performed and  it is as noted above otherwise negative.   Patient Active Problem List   Diagnosis Date Noted   Frequent falls 05/12/2023   Closed fracture of proximal end of left fibula 05/05/2023   Closed nondisplaced oblique fracture of shaft of left tibia 05/05/2023   UTI (urinary tract  infection) 05/04/2023   COPD (chronic obstructive pulmonary disease) (HCC)    Tibial plateau fracture 05/03/2023   Tibial fracture 05/03/2023   Moderate persistent asthma without complication 03/10/2022   Essential hypertension 02/17/2022   Olecranon bursitis of right elbow 08/23/2021   Leg hematoma, left, initial encounter 05/03/2021   Syncope 05/03/2021   Osteoarthritis of left knee 04/12/2021   Leukocytes in urine 12/13/2019   Urinary frequency 12/13/2019   Closed fracture of metatarsal bone 12/25/2018   Polyp of ascending colon 02/16/2018   Diverticula, colon 02/16/2018   Hypothyroidism 02/16/2018   Essential hypertension, benign 02/16/2018   ASCVD (arteriosclerotic cardiovascular disease) 02/16/2018   Periumbilical abdominal pain 02/16/2018   GI bleed 02/16/2018   Iron deficiency anemia, unspecified 02/16/2018   Chronic liver disease 02/16/2018   Carrier of alpha-1-antitrypsin deficiency 02/16/2018   History of colonic polyps 02/16/2018   Abnormal liver CT 02/16/2018   Elevated liver enzymes 02/16/2018   History of hepatitis C 02/16/2018   Abnormal findings on imaging test 02/16/2018   Helicobacter pylori gastritis 02/16/2018   Gait instability 02/13/2018   Mixed hyperlipidemia 02/13/2018   Atherosclerosis of native coronary artery of native heart with stable angina pectoris 02/12/2018   Centrilobular emphysema (HCC) 02/12/2018   Anemia 02/12/2018   Benign neoplasm of cecum 01/16/2018   Heterozygous alpha 1-antitrypsin deficiency (HCC) 01/16/2018   GERD (gastroesophageal reflux disease) 01/16/2018   Bronchiectasis without complication (HCC) 05/16/2016   Moderate episode of recurrent major depressive disorder (HCC) 04/14/2016   Bloody stool 09/15/2015   Left lower quadrant pain 09/15/2015   Long term (current) use of antithrombotics/antiplatelets 09/15/2015   Dyspnea 05/18/2015   Anxiety 11/14/2011   DJD (degenerative joint disease) 11/14/2011   Abnormal stress test  04/20/2010   Dyslipidemia 04/20/2010   Status post coronary artery stent placement 04/20/2010   CAD (coronary artery disease) 04/20/2010    Social History   Tobacco Use   Smoking status: Never   Smokeless tobacco: Never  Substance Use Topics   Alcohol use: Never    Allergies  Allergen Reactions   Shellfish Allergy Anaphylaxis    Ended up in the ED after eating shellfish, caused diarrhea and severe GI upset , unsure if caused SOB   Shellfish-Derived Products     Other reaction(s): Not available   Sulfa Antibiotics     Current Meds  Medication Sig   albuterol  (VENTOLIN  HFA) 108 (90 Base) MCG/ACT inhaler TAKE 2 PUFFS BY MOUTH EVERY 6 HOURS AS NEEDED FOR WHEEZE OR SHORTNESS OF BREATH   alendronate (FOSAMAX) 70 MG tablet alendronate 70 mg tablet  TAKE 1 TABLET BY MOUTH ONCE WEEKLY   azithromycin (ZITHROMAX) 250 MG tablet Take 2 tablets (500 mg) on  Day 1,  followed by 1 tablet (250 mg) once daily on Days 2 through 5.   bisacodyl  (DULCOLAX) 5 MG EC tablet Take 1 tablet (5 mg total) by mouth daily as needed for moderate constipation.   celecoxib  (CELEBREX ) 100 MG capsule TAKE 1 CAPSULE BY MOUTH EVERY DAY   clopidogrel  (PLAVIX ) 75 MG tablet TAKE 1 TABLET BY MOUTH EVERY DAY   dextromethorphan-guaiFENesin (ROBITUSSIN-DM) 10-100 MG/5ML liquid Take by mouth every 4 (four) hours as needed for  cough.   DULoxetine  (CYMBALTA ) 60 MG capsule Take 60 mg by mouth daily.   feeding supplement (ENSURE ENLIVE / ENSURE PLUS) LIQD Take 237 mLs by mouth 2 (two) times daily between meals.   HYDROcodone -acetaminophen  (NORCO/VICODIN) 5-325 MG tablet Take 1-2 tablets by mouth every 6 (six) hours as needed for moderate pain (pain score 4-6).   isosorbide  mononitrate (IMDUR ) 30 MG 24 hr tablet Take 1 tablet (30 mg total) by mouth daily.   lidocaine  (LIDODERM ) 5 % Place 1 patch onto the skin daily.   losartan  (COZAAR ) 50 MG tablet Take 50 mg by mouth daily.   Multiple Vitamin (MULTIVITAMIN WITH MINERALS) TABS  tablet Take 1 tablet by mouth daily.   nitroGLYCERIN  (NITROSTAT ) 0.4 MG SL tablet Place 1 tablet (0.4 mg total) under the tongue every 5 (five) minutes as needed for chest pain.   pantoprazole  (PROTONIX ) 20 MG tablet Take 20 mg by mouth daily.   polyethylene glycol (MIRALAX  / GLYCOLAX ) 17 g packet Take 17 g by mouth 2 (two) times daily.   rosuvastatin  (CRESTOR ) 10 MG tablet TAKE 1 TABLET BY MOUTH EVERY DAY   traZODone (DESYREL) 100 MG tablet Take 100 mg by mouth at bedtime.   TRELEGY ELLIPTA  100-62.5-25 MCG/ACT AEPB TAKE 1 PUFF BY MOUTH EVERY DAY   [DISCONTINUED] acetaminophen  (TYLENOL ) 500 MG tablet Take 500 mg by mouth every 4 (four) hours as needed.    Immunization History  Administered Date(s) Administered   Fluad Quad(high Dose 65+) 10/24/2018, 10/07/2021   Fluad Trivalent(High Dose 65+) 10/28/2016   INFLUENZA, HIGH DOSE SEASONAL PF 10/28/2016, 11/03/2017, 11/01/2022   Influenza Split 11/01/2013   Influenza,inj,Quad PF,6+ Mos 12/05/2014   Influenza-Unspecified 10/14/2015, 10/28/2016, 10/31/2016, 10/01/2021   Moderna Covid-19 Fall Seasonal Vaccine 53yrs & older 11/13/2022   PFIZER(Purple Top)SARS-COV-2 Vaccination 03/05/2019, 03/26/2019, 12/12/2019   Pneumococcal Conjugate-13 07/14/2014   Pneumococcal Polysaccharide-23 07/29/2015   Tdap 04/26/2023        Objective:     BP (!) 120/58   Pulse 93   Ht 5' 5 (1.651 m)   Wt 142 lb (64.4 kg)   SpO2 93%   BMI 23.63 kg/m   SpO2: 93 %  GENERAL: Elderly woman, presents in transport chair, very frail, mild conversational dyspnea, appears somewhat befuddled. HEAD: Normocephalic, atraumatic.  EYES: Pupils equal, round, reactive to light.  No scleral icterus.  MOUTH: Dentition intact, oral mucosa moist NECK: Supple. No thyromegaly. Trachea midline. No JVD.  No adenopathy. PULMONARY: Good air entry bilaterally.  No overt adventitious sounds. CARDIOVASCULAR: S1 and S2. Regular rate and rhythm.  No rubs, murmurs or gallops  heard. ABDOMEN: Benign.   MUSCULOSKELETAL: No joint deformity, no clubbing, 1-2+ pitting edema lower extremities.  NEUROLOGIC: Gait not tested, no overt focal deficit. SKIN: Intact,warm,dry. PSYCH: Appears somewhat befuddled.   Assessment & Plan:     ICD-10-CM   1. Subacute cough  R05.2 CBC with Differential/Platelet    US  Venous Img Lower Bilateral (DVT)    [facility] Pulmonary Perfusion    DG Chest 2 View    CANCELED: [facility] Perfusion    CANCELED: US  Venous Img Lower Bilateral (DVT)    CANCELED: DG Chest 2 View    2. Moderate persistent asthma without complication  J45.40 CBC with Differential/Platelet    3. Bronchiectasis without complication (HCC)  J47.9     4. Heterozygous alpha 1-antitrypsin deficiency (HCC)  E88.01     5. Shortness of breath  R06.02 D-Dimer, Quantitative    US  Venous Img Lower Bilateral (DVT)  [facility]    CANCELED: [facility]    CANCELED: US  Venous Img Lower Bilateral (DVT)    6. Positive D dimer  R79.89 US  Venous Img Lower Bilateral (DVT)    [facility]    DG Chest 2 View    CANCELED: [facility]    CANCELED: US  Venous Img Lower Bilateral (DVT)    CANCELED: DG Chest 2 View    7. Closed fracture of left tibial plateau, sequela  S82.142S     8. Bilateral leg edema  R60.0       Orders Placed This Encounter  Procedures   US  Venous Img Lower Bilateral (DVT)    Standing Status:   Future    Expected Date:   11/08/2023    Expiration Date:   11/06/2024    Scheduling Instructions:     Try to coordinate with VQ scan.    Reason for Exam (SYMPTOM  OR DIAGNOSIS REQUIRED):   DVT    Preferred imaging location?:   [facility]   [facility] Perfusion    Patient with increased shortness of breath, cough and elevated D-dimer.    Standing Status:   Future    Expected Date:   11/08/2023    Expiration Date:   11/06/2024    If indicated for the ordered procedure, I authorize the administration of a  radiopharmaceutical per Radiology protocol:   Yes    Preferred imaging location?:   [facility]   DG Chest 2 View    Standing Status:   Future    Expected Date:   11/08/2023    Expiration Date:   11/06/2024    Reason for Exam (SYMPTOM  OR DIAGNOSIS REQUIRED):   shortness of breath    Preferred imaging location?:    [facility]   D-Dimer, Quantitative    Standing Status:   Future    Number of Occurrences:   1    Expiration Date:   11/06/2024   CBC with Differential/Platelet    Standing Status:   Future    Number of Occurrences:   1    Expiration Date:   11/06/2024    Meds ordered this encounter  Medications   azithromycin (ZITHROMAX) 250 MG tablet    Sig: Take 2 tablets (500 mg) on  Day 1,  followed by 1 tablet (250 mg) once daily on Days 2 through 5.    Dispense:  6 each    Refill:  0   Discussion:    Postinfectious cough Chronic cough for two to three weeks, likely due to a viral upper respiratory infection. Differential diagnosis includes heart failure and potential thromboembolism due to immobility. No evidence of pneumonia on previous chest x-ray. Consideration of atypical infection not covered by amoxicillin . Postinfectious cough can resolve in 8 to 12 weeks with or without treatment. - Order one view chest x-ray - Continue cough syrup with codeine at night - Alternate with non-codeine cough syrup during the day - Check blood test for likelihood of clot (D-dimer)  Asthma Chronic asthma with effective use of inhalers. Lungs are clear. Rescue inhaler (albuterol ) is used effectively during coughing episodes. - Continue current inhaler regimen     Addendum: D-dimer returned significantly elevated at 8.61 micrograms/mL-FEU will proceed to evaluate for potential PE.  Patient has advanced age, frailty and mild renal insufficiency will obtain VQ scan and Dopplers for evaluation.  Trying to avoid contrast dye.  We will see the patient in follow-up in 3 weeks  time she is to  contact us  prior to that time for any new difficulties arise.   Advised if symptoms do not improve or worsen, to please contact office for sooner follow up or seek emergency care.    I spent 42 minutes of dedicated to the care of this patient on the date of this encounter to include pre-visit review of records, face-to-face time with the patient discussing conditions above, post visit ordering of testing, clinical documentation with the electronic health record, making appropriate referrals as documented, and communicating necessary findings to members of the patients care team.     C. Leita Sanders, MD Advanced Bronchoscopy PCCM [facility]    *This note was generated using voice recognition software/Dragon and/or AI transcription program.  Despite best efforts to proofread, errors can occur which can change the meaning. Any transcriptional errors that result from this process are unintentional and may not be fully corrected at the time of dictation.

## 2023-11-08 ENCOUNTER — Other Ambulatory Visit: Payer: Self-pay

## 2023-11-08 ENCOUNTER — Ambulatory Visit
Admission: RE | Admit: 2023-11-08 | Discharge: 2023-11-08 | Disposition: A | Discharge status: Home / Self Care | Source: Ambulatory Visit | Attending: Pulmonary Disease | Admitting: Pulmonary Disease

## 2023-11-08 ENCOUNTER — Inpatient Hospital Stay
Admission: EM | Admit: 2023-11-08 | Discharge: 2023-11-28 | DRG: 175 | Disposition: A | Discharge status: Skilled Nursing Facility | Attending: Obstetrics and Gynecology | Admitting: Obstetrics and Gynecology

## 2023-11-08 ENCOUNTER — Ambulatory Visit: Admission: RE | Admit: 2023-11-08 | Discharge: 2023-11-08 | Attending: Pulmonary Disease | Admitting: Pulmonary Disease

## 2023-11-08 ENCOUNTER — Telehealth: Payer: Self-pay | Admitting: Pulmonary Disease

## 2023-11-08 DIAGNOSIS — E8801 Alpha-1-antitrypsin deficiency: Secondary | ICD-10-CM | POA: Diagnosis present

## 2023-11-08 DIAGNOSIS — Z90710 Acquired absence of both cervix and uterus: Secondary | ICD-10-CM

## 2023-11-08 DIAGNOSIS — D62 Acute posthemorrhagic anemia: Secondary | ICD-10-CM | POA: Diagnosis not present

## 2023-11-08 DIAGNOSIS — R7989 Other specified abnormal findings of blood chemistry: Secondary | ICD-10-CM

## 2023-11-08 DIAGNOSIS — I25118 Atherosclerotic heart disease of native coronary artery with other forms of angina pectoris: Secondary | ICD-10-CM

## 2023-11-08 DIAGNOSIS — I2699 Other pulmonary embolism without acute cor pulmonale: Secondary | ICD-10-CM | POA: Diagnosis not present

## 2023-11-08 DIAGNOSIS — K219 Gastro-esophageal reflux disease without esophagitis: Secondary | ICD-10-CM | POA: Diagnosis present

## 2023-11-08 DIAGNOSIS — Z7951 Long term (current) use of inhaled steroids: Secondary | ICD-10-CM

## 2023-11-08 DIAGNOSIS — R0902 Hypoxemia: Secondary | ICD-10-CM | POA: Diagnosis present

## 2023-11-08 DIAGNOSIS — R0602 Shortness of breath: Secondary | ICD-10-CM | POA: Insufficient documentation

## 2023-11-08 DIAGNOSIS — J440 Chronic obstructive pulmonary disease with acute lower respiratory infection: Secondary | ICD-10-CM | POA: Diagnosis present

## 2023-11-08 DIAGNOSIS — J90 Pleural effusion, not elsewhere classified: Secondary | ICD-10-CM

## 2023-11-08 DIAGNOSIS — I2510 Atherosclerotic heart disease of native coronary artery without angina pectoris: Secondary | ICD-10-CM | POA: Diagnosis present

## 2023-11-08 DIAGNOSIS — J449 Chronic obstructive pulmonary disease, unspecified: Secondary | ICD-10-CM | POA: Diagnosis present

## 2023-11-08 DIAGNOSIS — K921 Melena: Secondary | ICD-10-CM | POA: Diagnosis not present

## 2023-11-08 DIAGNOSIS — Z515 Encounter for palliative care: Secondary | ICD-10-CM

## 2023-11-08 DIAGNOSIS — D649 Anemia, unspecified: Secondary | ICD-10-CM | POA: Diagnosis present

## 2023-11-08 DIAGNOSIS — R052 Subacute cough: Secondary | ICD-10-CM

## 2023-11-08 DIAGNOSIS — D6832 Hemorrhagic disorder due to extrinsic circulating anticoagulants: Secondary | ICD-10-CM | POA: Diagnosis not present

## 2023-11-08 DIAGNOSIS — F331 Major depressive disorder, recurrent, moderate: Secondary | ICD-10-CM | POA: Diagnosis present

## 2023-11-08 DIAGNOSIS — L89151 Pressure ulcer of sacral region, stage 1: Secondary | ICD-10-CM | POA: Diagnosis present

## 2023-11-08 DIAGNOSIS — Z9049 Acquired absence of other specified parts of digestive tract: Secondary | ICD-10-CM

## 2023-11-08 DIAGNOSIS — Z90722 Acquired absence of ovaries, bilateral: Secondary | ICD-10-CM

## 2023-11-08 DIAGNOSIS — E782 Mixed hyperlipidemia: Secondary | ICD-10-CM | POA: Diagnosis present

## 2023-11-08 DIAGNOSIS — E039 Hypothyroidism, unspecified: Secondary | ICD-10-CM | POA: Diagnosis present

## 2023-11-08 DIAGNOSIS — J189 Pneumonia, unspecified organism: Secondary | ICD-10-CM | POA: Diagnosis present

## 2023-11-08 DIAGNOSIS — Z882 Allergy status to sulfonamides status: Secondary | ICD-10-CM

## 2023-11-08 DIAGNOSIS — D75839 Thrombocytosis, unspecified: Secondary | ICD-10-CM | POA: Diagnosis present

## 2023-11-08 DIAGNOSIS — I2694 Multiple subsegmental pulmonary emboli without acute cor pulmonale: Principal | ICD-10-CM | POA: Diagnosis present

## 2023-11-08 DIAGNOSIS — Z7983 Long term (current) use of bisphosphonates: Secondary | ICD-10-CM

## 2023-11-08 DIAGNOSIS — Z79899 Other long term (current) drug therapy: Secondary | ICD-10-CM

## 2023-11-08 DIAGNOSIS — Z66 Do not resuscitate: Secondary | ICD-10-CM | POA: Diagnosis present

## 2023-11-08 DIAGNOSIS — E871 Hypo-osmolality and hyponatremia: Secondary | ICD-10-CM | POA: Diagnosis present

## 2023-11-08 DIAGNOSIS — Z955 Presence of coronary angioplasty implant and graft: Secondary | ICD-10-CM

## 2023-11-08 DIAGNOSIS — T45515A Adverse effect of anticoagulants, initial encounter: Secondary | ICD-10-CM | POA: Diagnosis present

## 2023-11-08 DIAGNOSIS — E875 Hyperkalemia: Secondary | ICD-10-CM | POA: Diagnosis not present

## 2023-11-08 DIAGNOSIS — X58XXXA Exposure to other specified factors, initial encounter: Secondary | ICD-10-CM | POA: Diagnosis present

## 2023-11-08 DIAGNOSIS — Z9181 History of falling: Secondary | ICD-10-CM

## 2023-11-08 DIAGNOSIS — Z751 Person awaiting admission to adequate facility elsewhere: Secondary | ICD-10-CM

## 2023-11-08 DIAGNOSIS — Z7189 Other specified counseling: Secondary | ICD-10-CM

## 2023-11-08 DIAGNOSIS — Z7901 Long term (current) use of anticoagulants: Secondary | ICD-10-CM

## 2023-11-08 DIAGNOSIS — Z7902 Long term (current) use of antithrombotics/antiplatelets: Secondary | ICD-10-CM

## 2023-11-08 DIAGNOSIS — F419 Anxiety disorder, unspecified: Secondary | ICD-10-CM | POA: Diagnosis present

## 2023-11-08 DIAGNOSIS — Z8249 Family history of ischemic heart disease and other diseases of the circulatory system: Secondary | ICD-10-CM

## 2023-11-08 DIAGNOSIS — R739 Hyperglycemia, unspecified: Secondary | ICD-10-CM | POA: Diagnosis present

## 2023-11-08 DIAGNOSIS — R338 Other retention of urine: Secondary | ICD-10-CM | POA: Insufficient documentation

## 2023-11-08 DIAGNOSIS — K5900 Constipation, unspecified: Secondary | ICD-10-CM | POA: Diagnosis not present

## 2023-11-08 DIAGNOSIS — Z9079 Acquired absence of other genital organ(s): Secondary | ICD-10-CM

## 2023-11-08 DIAGNOSIS — R339 Retention of urine, unspecified: Secondary | ICD-10-CM | POA: Diagnosis not present

## 2023-11-08 DIAGNOSIS — Z91013 Allergy to seafood: Secondary | ICD-10-CM

## 2023-11-08 DIAGNOSIS — I10 Essential (primary) hypertension: Secondary | ICD-10-CM | POA: Diagnosis present

## 2023-11-08 DIAGNOSIS — J432 Centrilobular emphysema: Secondary | ICD-10-CM | POA: Diagnosis present

## 2023-11-08 DIAGNOSIS — Z23 Encounter for immunization: Secondary | ICD-10-CM

## 2023-11-08 DIAGNOSIS — S82832A Other fracture of upper and lower end of left fibula, initial encounter for closed fracture: Secondary | ICD-10-CM | POA: Diagnosis present

## 2023-11-08 DIAGNOSIS — L8990 Pressure ulcer of unspecified site, unspecified stage: Secondary | ICD-10-CM | POA: Insufficient documentation

## 2023-11-08 LAB — BASIC METABOLIC PANEL WITH GFR
Anion gap: 9 (ref 5–15)
BUN: 30 mg/dL — ABNORMAL HIGH (ref 8–23)
CO2: 21 mmol/L — ABNORMAL LOW (ref 22–32)
Calcium: 8.4 mg/dL — ABNORMAL LOW (ref 8.9–10.3)
Chloride: 103 mmol/L (ref 98–111)
Creatinine, Ser: 1.06 mg/dL — ABNORMAL HIGH (ref 0.44–1.00)
GFR, Estimated: 49 mL/min — ABNORMAL LOW (ref >60)
Glucose, Bld: 205 mg/dL — ABNORMAL HIGH (ref 70–99)
Potassium: 5.4 mmol/L — ABNORMAL HIGH (ref 3.5–5.1)
Sodium: 133 mmol/L — ABNORMAL LOW (ref 135–145)

## 2023-11-08 LAB — PROTIME-INR
INR: 1 (ref 0.8–1.2)
Prothrombin Time: 14 s (ref 11.4–15.2)

## 2023-11-08 LAB — CBC
HCT: 29.3 % — ABNORMAL LOW (ref 36.0–46.0)
Hemoglobin: 8.9 g/dL — ABNORMAL LOW (ref 12.0–15.0)
MCH: 27.9 pg (ref 26.0–34.0)
MCHC: 30.4 g/dL (ref 30.0–36.0)
MCV: 91.8 fL (ref 80.0–100.0)
Platelets: 525 K/uL — ABNORMAL HIGH (ref 150–400)
RBC: 3.19 MIL/uL — ABNORMAL LOW (ref 3.87–5.11)
RDW: 16.4 % — ABNORMAL HIGH (ref 11.5–15.5)
WBC: 13.1 K/uL — ABNORMAL HIGH (ref 4.0–10.5)
nRBC: 0 % (ref 0.0–0.2)

## 2023-11-08 LAB — APTT: aPTT: 29 s (ref 24–36)

## 2023-11-08 LAB — TROPONIN I (HIGH SENSITIVITY)
Troponin I (High Sensitivity): 8 ng/L (ref <18)
Troponin I (High Sensitivity): 8 ng/L (ref <18)

## 2023-11-08 MED ORDER — ALBUTEROL SULFATE (2.5 MG/3ML) 0.083% IN NEBU
2.5000 mg | INHALATION_SOLUTION | Freq: Four times a day (QID) | RESPIRATORY_TRACT | Status: DC | PRN
  Administered 2023-11-08: 2.5 mg via RESPIRATORY_TRACT
  Filled 2023-11-08 (×2): qty 3

## 2023-11-08 MED ORDER — HEPARIN (PORCINE) 25000 UT/250ML-% IV SOLN
1300.0000 [IU]/h | INTRAVENOUS | Status: DC
  Administered 2023-11-08: 1050 [IU]/h via INTRAVENOUS
  Administered 2023-11-09: 1300 [IU]/h via INTRAVENOUS
  Filled 2023-11-08 (×4): qty 250

## 2023-11-08 MED ORDER — ROSUVASTATIN CALCIUM 10 MG PO TABS
10.0000 mg | ORAL_TABLET | Freq: Every day | ORAL | Status: DC
Start: 2023-11-09 — End: 2023-11-15
  Administered 2023-11-09 – 2023-11-14 (×6): 10 mg via ORAL
  Filled 2023-11-08 (×6): qty 1

## 2023-11-08 MED ORDER — LOSARTAN POTASSIUM 50 MG PO TABS
50.0000 mg | ORAL_TABLET | Freq: Every day | ORAL | Status: DC
Start: 2023-11-09 — End: 2023-11-24
  Administered 2023-11-09 – 2023-11-24 (×16): 50 mg via ORAL
  Filled 2023-11-08 (×16): qty 1

## 2023-11-08 MED ORDER — TECHNETIUM TO 99M ALBUMIN AGGREGATED
4.4100 | Freq: Once | INTRAVENOUS | Status: AC | PRN
  Administered 2023-11-08: 4.41 via INTRAVENOUS

## 2023-11-08 MED ORDER — ACETAMINOPHEN 650 MG RE SUPP: 650.0000 mg | Freq: Four times a day (QID) | RECTAL | Status: DC | PRN

## 2023-11-08 MED ORDER — ENSURE ENLIVE PO LIQD
237.0000 mL | Freq: Two times a day (BID) | ORAL | Status: DC
  Administered 2023-11-09 – 2023-11-28 (×29): 237 mL via ORAL
  Filled 2023-11-08: qty 237

## 2023-11-08 MED ORDER — POLYETHYLENE GLYCOL 3350 17 G PO PACK
17.0000 g | PACK | Freq: Every day | ORAL | Status: DC | PRN
Start: 2023-11-08 — End: 2023-11-26
  Administered 2023-11-15 – 2023-11-24 (×5): 17 g via ORAL
  Filled 2023-11-08 (×6): qty 1

## 2023-11-08 MED ORDER — TRAZODONE HCL 50 MG PO TABS
100.0000 mg | ORAL_TABLET | Freq: Every day | ORAL | Status: DC
  Administered 2023-11-08 – 2023-11-27 (×20): 100 mg via ORAL
  Filled 2023-11-08 (×5): qty 1
  Filled 2023-11-08: qty 2
  Filled 2023-11-08 (×5): qty 1
  Filled 2023-11-08: qty 2
  Filled 2023-11-08 (×8): qty 1

## 2023-11-08 MED ORDER — ACETAMINOPHEN 325 MG PO TABS
650.0000 mg | ORAL_TABLET | Freq: Four times a day (QID) | ORAL | Status: DC | PRN
  Administered 2023-11-09 – 2023-11-14 (×7): 650 mg via ORAL
  Filled 2023-11-08 (×7): qty 2

## 2023-11-08 MED ORDER — IPRATROPIUM-ALBUTEROL 0.5-2.5 (3) MG/3ML IN SOLN
6.0000 mL | Freq: Once | RESPIRATORY_TRACT | Status: AC
  Administered 2023-11-08: 6 mL via RESPIRATORY_TRACT
  Filled 2023-11-08: qty 6

## 2023-11-08 MED ORDER — SODIUM CHLORIDE 0.9% FLUSH
3.0000 mL | Freq: Two times a day (BID) | INTRAVENOUS | Status: DC
  Administered 2023-11-08 – 2023-11-28 (×40): 3 mL via INTRAVENOUS

## 2023-11-08 MED ORDER — CLOPIDOGREL BISULFATE 75 MG PO TABS
75.0000 mg | ORAL_TABLET | Freq: Every day | ORAL | Status: DC
  Administered 2023-11-09 – 2023-11-24 (×16): 75 mg via ORAL
  Filled 2023-11-08 (×16): qty 1

## 2023-11-08 MED ORDER — ADULT MULTIVITAMIN W/MINERALS CH
1.0000 | ORAL_TABLET | Freq: Every day | ORAL | Status: DC
  Administered 2023-11-09 – 2023-11-15 (×7): 1 via ORAL
  Filled 2023-11-08 (×7): qty 1

## 2023-11-08 MED ORDER — PANTOPRAZOLE SODIUM 20 MG PO TBEC
20.0000 mg | DELAYED_RELEASE_TABLET | Freq: Every day | ORAL | Status: DC
Start: 2023-11-09 — End: 2023-11-20
  Administered 2023-11-09 – 2023-11-19 (×11): 20 mg via ORAL
  Filled 2023-11-08 (×14): qty 1

## 2023-11-08 MED ORDER — DULOXETINE HCL 30 MG PO CPEP
60.0000 mg | ORAL_CAPSULE | Freq: Every day | ORAL | Status: DC
  Administered 2023-11-09 – 2023-11-28 (×19): 60 mg via ORAL
  Filled 2023-11-08 (×3): qty 2
  Filled 2023-11-08: qty 1
  Filled 2023-11-08 (×11): qty 2
  Filled 2023-11-08: qty 1
  Filled 2023-11-08 (×4): qty 2

## 2023-11-08 MED ORDER — BUDESON-GLYCOPYRROL-FORMOTEROL 160-9-4.8 MCG/ACT IN AERO
2.0000 | INHALATION_SPRAY | Freq: Two times a day (BID) | RESPIRATORY_TRACT | Status: DC
  Administered 2023-11-08 – 2023-11-28 (×38): 2 via RESPIRATORY_TRACT
  Filled 2023-11-08 (×2): qty 5.9

## 2023-11-08 MED ORDER — CELECOXIB 100 MG PO CAPS
100.0000 mg | ORAL_CAPSULE | Freq: Every day | ORAL | Status: DC
  Administered 2023-11-09 – 2023-11-25 (×17): 100 mg via ORAL
  Filled 2023-11-08 (×17): qty 1

## 2023-11-08 MED ORDER — ISOSORBIDE MONONITRATE ER 30 MG PO TB24
30.0000 mg | ORAL_TABLET | Freq: Every day | ORAL | Status: DC
  Administered 2023-11-09 – 2023-11-19 (×11): 30 mg via ORAL
  Filled 2023-11-08 (×12): qty 1

## 2023-11-08 MED ORDER — HEPARIN BOLUS VIA INFUSION
4000.0000 [IU] | Freq: Once | INTRAVENOUS | Status: AC
  Administered 2023-11-08: 4000 [IU] via INTRAVENOUS
  Filled 2023-11-08: qty 4000

## 2023-11-08 NOTE — ED Provider Notes (Signed)
 [facility] Provider Note    Event Date/Time   First MD Initiated Contact with Patient 11/08/23 1633     (approximate)   History   Shortness of Breath   HPI  Terri Braun is a 89 year old female with history of asthma, bronchiectasis presenting to the emergency department for evaluation of abnormal outpatient imaging.  Patient was seen by her pulmonologist yesterday for an acute visit due to a cough present for the past 2 to 3 weeks.  She was ordered for chest x-Prestyn Stanco and blood work. Blood work demonstrated elevated D-dimer.  Perfusion scan was ordered as she has a history of renal insufficiency as well as bilateral lower extremity ultrasounds.  These imaging test were performed today.  The ultrasound of the lower extremities was negative, but her perfusion study demonstrated 2 areas of perfusion deficits with concerns for additional embolic involvement with overall radiology report of high probability for bilateral acute PE.  Patient was directed to the ER in the setting of these findings.  Patient tells me she has had shortness of breath for a long time, does feel this is worse recently and specifically notes she has developed pleuritic chest pain, but cannot remember exactly when this started.      Physical Exam   Triage Vital Signs: ED Triage Vitals [11/08/23 1622]  Encounter Vitals Group     BP      Girls Systolic BP Percentile      Girls Diastolic BP Percentile      Boys Systolic BP Percentile      Boys Diastolic BP Percentile      Pulse      Resp      Temp      Temp src      SpO2      Weight 141 lb 15.6 oz (64.4 kg)     Height 5' 5 (1.651 m)     Head Circumference      Peak Flow      Pain Score      Pain Loc      Pain Education      Exclude from Growth Chart     Most recent vital signs: Vitals:   11/08/23 1654  BP: (!) 113/44  Pulse: 89  Resp: (!) 27  Temp: 98.1 F (36.7 C)  SpO2: 99%     General: Awake, interactive   CV:  Good peripheral perfusion, regular rate and rhythm Resp:  Unlabored respirations, scattered expiratory wheezing noted Abd:  Nondistended.  Neuro:  Symmetric facial movement, fluid speech Other:   No significant lower extremity swelling   ED Results / Procedures / Treatments   Labs (all labs ordered are listed, but only abnormal results are displayed) Labs Reviewed  BASIC METABOLIC PANEL WITH GFR - Abnormal; Notable for the following components:      Result Value   Sodium 133 (*)    Potassium 5.4 (*)    CO2 21 (*)    Glucose, Bld 205 (*)    BUN 30 (*)    Creatinine, Ser 1.06 (*)    Calcium  8.4 (*)    GFR, Estimated 49 (*)    All other components within normal limits  CBC - Abnormal; Notable for the following components:   WBC 13.1 (*)    RBC 3.19 (*)    Hemoglobin 8.9 (*)    HCT 29.3 (*)    RDW 16.4 (*)    Platelets 525 (*)    All other components  within normal limits  PROTIME-INR  APTT  COMPREHENSIVE METABOLIC PANEL WITH GFR  CBC  TROPONIN I (HIGH SENSITIVITY)     EKG EKG independently reviewed and interpreted by myself demonstrates:  EKG demonstrate sinus rhythm at a rate of 93, PR 130, QRS 81, QTc 397, nonspecific ST changes, no STEMI  RADIOLOGY Imaging independently reviewed and interpreted by myself demonstrates:  Lower extremity ultrasound without evidence of DVT Chest x-Cheryel Kyte left basilar atelectasis Perfusion study concerning for PE  Formal Radiology Read:  NM Pulmonary Perfusion Result Date: 11/08/2023 EXAM: NM Lung Perfusion Scan. CLINICAL HISTORY: Pulmonary embolism (PE) suspected, low to intermediate probability, negative D-dimer. Subacute cough, shortness of breath. TECHNIQUE: Radiolabeled MAA was administered intravenously and planar images of the lungs were obtained in multiple projections. RADIOPHARMACEUTICAL: 4.41 mCi Technetium-53m albumin aggregated (MAA) injection solution. COMPARISON: Chest radiograph 11/08/2023. FINDINGS: PERFUSION: There  are 2 wedge-shaped peripheral perfusion defects in the right middle lobe. There is overall poor regional perfusion to the left lower lobe. This poor perfusion is greater than the effusion seen on comparison radiograph. IMPRESSION: 1. Two wedge-shaped peripheral perfusion defects in the right middle lobe, suspicious for pulmonary emboli. 2. Overall poor regional perfusion to the left lower lobe, out of proportion to the small effusion on chest radiograph, raising concern for additional embolic involvement. 3. Overall concern for bilateral acute Pulmonary Emboli.  high probability. These results will be called to the ordering clinician or representative by the radiologist assistant, and communication documented in the pacs or clario dashboard Electronically signed by: Norleen Boxer MD 11/08/2023 02:53 PM EDT RP Workstation: [id number]   US  Venous Img Lower Bilateral (DVT) Result Date: 11/08/2023 EXAM: ULTRASOUND DUPLEX OF THE BILATERAL LOWER EXTREMITY VEINS TECHNIQUE: Duplex ultrasound using B-mode/gray scaled imaging and Doppler spectral analysis and color flow was obtained of the deep venous structures of the bilateral lower extremity. COMPARISON: CT left lower extremity 05/22/2023. CLINICAL HISTORY: DVT. FINDINGS: LEFT: The common femoral vein, femoral vein, popliteal vein, and posterior tibial vein of the left lower extremity demonstrate normal compressibility with normal color flow and spectral analysis. RIGHT: The common femoral vein, femoral vein, popliteal vein, and posterior tibial vein of the right lower extremity demonstrate normal compressibility with normal color flow and spectral analysis. IMPRESSION: 1. No evidence of deep venous thrombosis in the evaluated lower extremity veins. Electronically signed by: Norleen Boxer MD 11/08/2023 02:44 PM EDT RP Workstation: [id number]   DG Chest 2 View Result Date: 11/08/2023 CLINICAL DATA:  shortness of breath EXAM: CHEST - 2 VIEW COMPARISON:  03/20/2018  FINDINGS: Small to moderate volume left pleural effusion. Left basilar airspace opacities, likely atelectasis. No pneumothorax. No cardiomegaly. Aortic atherosclerosis. Diffuse osteopenia. No acute fracture or destructive lesions. Multilevel thoracic osteophytosis. IMPRESSION: Small to moderate volume left pleural effusion with left basilar atelectasis. Electronically Signed   By: Rogelia Myers M.D.   On: 11/08/2023 12:28    PROCEDURES:  Critical Care performed: Yes, see critical care procedure note(s)  CRITICAL CARE Performed by: Nilsa Dade   Total critical care time: 32 minutes  Critical care time was exclusive of separately billable procedures and treating other patients.  Critical care was necessary to treat or prevent imminent or life-threatening deterioration.  Critical care was time spent personally by me on the following activities: development of treatment plan with patient and/or surrogate as well as nursing, discussions with consultants, evaluation of patient's response to treatment, examination of patient, obtaining history from patient or surrogate, ordering and performing treatments and interventions, ordering and  review of laboratory studies, ordering and review of radiographic studies, pulse oximetry and re-evaluation of patient's condition.   Procedures   MEDICATIONS ORDERED IN ED: Medications  ipratropium-albuterol  (DUONEB) 0.5-2.5 (3) MG/3ML nebulizer solution 6 mL (has no administration in time range)  heparin bolus via infusion 4,000 Units (has no administration in time range)    Followed by  heparin ADULT infusion 100 units/mL (25000 units/250mL) (has no administration in time range)  celecoxib  (CELEBREX ) capsule 100 mg (has no administration in time range)  isosorbide  mononitrate (IMDUR ) 24 hr tablet 30 mg (has no administration in time range)  losartan  (COZAAR ) tablet 50 mg (has no administration in time range)  rosuvastatin  (CRESTOR ) tablet 10 mg (has no  administration in time range)  DULoxetine  (CYMBALTA ) DR capsule 60 mg (has no administration in time range)  traZODone (DESYREL) tablet 100 mg (has no administration in time range)  pantoprazole  (PROTONIX ) EC tablet 20 mg (has no administration in time range)  clopidogrel  (PLAVIX ) tablet 75 mg (has no administration in time range)  feeding supplement (ENSURE ENLIVE / ENSURE PLUS) liquid 237 mL (has no administration in time range)  multivitamin with minerals tablet 1 tablet (has no administration in time range)  albuterol  (VENTOLIN  HFA) 108 (90 Base) MCG/ACT inhaler 1-2 puff (has no administration in time range)  budesonide-glycopyrrolate-formoterol (BREZTRI) 160-9-4.8 MCG/ACT inhaler 2 puff (has no administration in time range)  sodium chloride  flush (NS) 0.9 % injection 3 mL (has no administration in time range)  acetaminophen  (TYLENOL ) tablet 650 mg (has no administration in time range)    Or  acetaminophen  (TYLENOL ) suppository 650 mg (has no administration in time range)  polyethylene glycol (MIRALAX  / GLYCOLAX ) packet 17 g (has no administration in time range)     IMPRESSION / MDM / ASSESSMENT AND PLAN / ED COURSE  I reviewed the triage vital signs and the nursing notes.  Differential diagnosis includes, but is not limited to, PE, pneumonia, asthma exacerbation  Patient's presentation is most consistent with acute presentation with potential threat to life or bodily function.  89 year old female presenting with subacute cough with pleuritic chest pain found to have a high concern for PE on outpatient V/Q study.  Stable vitals on presentation here.  Labs with leukocytosis with WBC 13.1, stable anemia with hemoglobin of 8.9.  Mild thrombocytosis with platelet count of 525.  BMP with mild hyperglycemia without evidence of DKA, mild renal impairment with creatinine 1.06.  Negative troponin.  While perfusion study is more limited to evaluate for right heart strain, with negative troponin and  subacute history, lower suspicion for this.  However, with patient's age, suspicion for bilateral PE, do think she is appropriate for admission.  Ordered for heparin as well as nebulizer treatments. Will reach out to hospitalist team.  Clinical Course as of 11/08/23 1744  Wed Nov 08, 2023  1743 Case discussed with hospitalist team.  They will evaluate for anticipated admission. [NR]    Clinical Course User Index [NR] Levander Slate, MD     FINAL CLINICAL IMPRESSION(S) / ED DIAGNOSES   Final diagnoses:  Multiple subsegmental pulmonary emboli without acute cor pulmonale (HCC)     Rx / DC Orders   ED Discharge Orders     None        Note:  This document was prepared using Dragon voice recognition software and may include unintentional dictation errors.   Levander Slate, MD 11/08/23 [phone]

## 2023-11-08 NOTE — Telephone Encounter (Signed)
 Patient had VQ scan today that was positive for multiple pulmonary emboli.  Patient also has left pleural effusion that may indicate pulmonary infarction.  Contacted patient's [facility] and they are transporting patient to ED at [facility] for admission.  Patient is very frail and with advanced age and is not suitable for management of PE as outpatient.  Recommend admission.  Contacted [facility] ED and patient's daughter and POA has also been notified.  KYM Leita Sanders, MD Advanced Bronchoscopy PCCM [facility]

## 2023-11-08 NOTE — ED Triage Notes (Signed)
 Pt comes in via [facility] from [facility] . Pt was had some test done this morning in the medical mall. Test revealed a PE in the right lung. Pt sent over by provider for evaluation. Pt has complaints of SOB on arrival. Pt is alert and oriented x3.

## 2023-11-08 NOTE — Telephone Encounter (Signed)
 Noted. Nothing further needed.

## 2023-11-08 NOTE — Progress Notes (Signed)
 PHARMACY - ANTICOAGULATION CONSULT NOTE  Pharmacy Consult for Heparin Infusion  Indication: pulmonary embolus  Allergies  Allergen Reactions   Shellfish Allergy Anaphylaxis    Ended up in the ED after eating shellfish, caused diarrhea and severe GI upset , unsure if caused SOB   Shellfish-Derived Products     Other reaction(s): Not available   Sulfa Antibiotics     Patient Measurements: Height: 5' 5 (165.1 cm) Weight: 64.4 kg (141 lb 15.6 oz) IBW/kg (Calculated) : 57 HEPARIN DW (KG): 64.4  Vital Signs:    Labs: Recent Labs    11/07/23 1048  HGB 9.6*  HCT 30.6*  PLT 679*    CrCl cannot be calculated (Patient's most recent lab result is older than the maximum 21 days allowed.).   Medical History: Past Medical History:  Diagnosis Date   Anemia    ASCVD (arteriosclerotic cardiovascular disease)    COPD (chronic obstructive pulmonary disease) (HCC)    Diverticulosis    GERD (gastroesophageal reflux disease)    Hypertension    Hypothyroidism      Assessment: 89 yo female with PMH of HTN, HLD, Hypothyroidism, GERD, CAD, Depression, COPD, and Anemia. Outpatient VQ scan positive for multiple pulmonary emboli. Pharmacy consulted for heparin infusion dosing and monitoring.   Goal of Therapy:  Heparin level 0.3-0.7 units/ml Monitor platelets by anticoagulation protocol: Yes   Plan:  Give 4000 units bolus x 1 Start heparin infusion at 1050 units/hr Check anti-Xa level in 8 hours and daily while on heparin Continue to monitor H&H and platelets  Estill CHRISTELLA Lutes, PharmD, BCPS Clinical Pharmacist 11/08/2023 4:49 PM

## 2023-11-08 NOTE — H&P (Signed)
 History and Physical   Terri Braun:[id number] DOB: 1929/04/04 DOA: 11/08/2023  PCP: Lenon Layman ORN, MD   Patient coming from: Home/pulmonologist  Chief Complaint: Shortness of breath, PE  HPI: Terri Braun is a 89 y.o. female with medical history significant of hypertension, hyperlipidemia, hypothyroidism, GERD, CAD status post stent, depression, anxiety, alpha 1 antitrypsin, COPD, anemia presenting with cough/shortness of breath/PE.  Patient has had some cough and shortness of breath though similar to her baseline shortness of breath for the past 2 to 3 weeks.  Saw pulmonologist yesterday and had a chest x-ray and labs done.  Labs showed elevated D-dimer and patient went for VQ scan today which was positive bilaterally.  DVT study also ordered which was negative.  Patient sent to the ED for PE.  Patient denies fevers, chills, chest pain, abdominal pain, constipation, diarrhea, nausea, vomiting.  ED Course: Vital signs in the ED notable for blood pressure in the 110s-120 systolic, respirate in the 20s.   Lab workup included BMP with sodium 133, potassium 5.4, bicarb 21, glucose 205, BUN 30, creatinine stable at 1.06, calcium  8.4.  CBC with leukocytosis of 13.1, hemoglobin stable at 8.9, platelets 525.  PT, INR, PTT normal.  Troponin normal.  Chest x-ray earlier today with left pleural effusion with atelectasis.  VQ scan showing the 2 wedge-shaped perfusion deficits consistent with positive/high risk PE.  DVT study negative bilaterally.  Patient started on heparin infusion and received DuoNeb in the ED.  Review of Systems: As per HPI otherwise all other systems reviewed and are negative.  Past Medical History:  Diagnosis Date   Anemia    ASCVD (arteriosclerotic cardiovascular disease)    COPD (chronic obstructive pulmonary disease) (HCC)    Diverticulosis    GERD (gastroesophageal reflux disease)    Hypertension    Hypothyroidism    Tibial fracture 05/03/2023     Past Surgical History:  Procedure Laterality Date   APPENDECTOMY     BREAST SURGERY  1983   Lumpectomy   CHOLECYSTECTOMY     COLON SURGERY     COLONOSCOPY  11/04/2015   CORONARY ANGIOPLASTY WITH STENT PLACEMENT     NISSEN FUNDOPLICATION     TOTAL ABDOMINAL HYSTERECTOMY     With BSO    Social History  reports that she has never smoked. She has never used smokeless tobacco. She reports that she does not drink alcohol and does not use drugs.  Allergies  Allergen Reactions   Shellfish Allergy Anaphylaxis    Ended up in the ED after eating shellfish, caused diarrhea and severe GI upset , unsure if caused SOB   Shellfish-Derived Products     Other reaction(s): Not available   Sulfa Antibiotics     Family History  Problem Relation Age of Onset   Coronary artery disease Mother    Heart disease Mother    Crohn's disease Son    Cancer Other        other family member with breast cancer  Reviewed on admission  Prior to Admission medications   Medication Sig Start Date End Date Taking? Authorizing Provider  albuterol  (VENTOLIN  HFA) 108 (90 Base) MCG/ACT inhaler TAKE 2 PUFFS BY MOUTH EVERY 6 HOURS AS NEEDED FOR WHEEZE OR SHORTNESS OF BREATH 04/27/22   Tamea Dedra CROME, MD  alendronate (FOSAMAX) 70 MG tablet alendronate 70 mg tablet  TAKE 1 TABLET BY MOUTH ONCE WEEKLY    [provider]  azithromycin (ZITHROMAX) 250 MG tablet Take 2 tablets (500 mg) on  Day 1,  followed by 1 tablet (250 mg) once daily on Days 2 through 5. 11/07/23 11/12/23  Tamea Dedra CROME, MD  bisacodyl  (DULCOLAX) 5 MG EC tablet Take 1 tablet (5 mg total) by mouth daily as needed for moderate constipation. 05/11/23   Amin, Sumayya, MD  celecoxib  (CELEBREX ) 100 MG capsule TAKE 1 CAPSULE BY MOUTH EVERY DAY 10/08/20   Bertrum Charlie CROME, MD  clopidogrel  (PLAVIX ) 75 MG tablet TAKE 1 TABLET BY MOUTH EVERY DAY 06/21/21   Bertrum Charlie CROME, MD  dextromethorphan-guaiFENesin (ROBITUSSIN-DM) 10-100 MG/5ML liquid  Take by mouth every 4 (four) hours as needed for cough.    [provider]  DULoxetine  (CYMBALTA ) 60 MG capsule Take 60 mg by mouth daily.    [provider]  feeding supplement (ENSURE ENLIVE / ENSURE PLUS) LIQD Take 237 mLs by mouth 2 (two) times daily between meals. 05/11/23   Amin, Sumayya, MD  HYDROcodone -acetaminophen  (NORCO/VICODIN) 5-325 MG tablet Take 1-2 tablets by mouth every 6 (six) hours as needed for moderate pain (pain score 4-6). 05/11/23   Amin, Sumayya, MD  isosorbide  mononitrate (IMDUR ) 30 MG 24 hr tablet Take 1 tablet (30 mg total) by mouth daily. 04/26/22   Ostwalt, Janna, PA-C  lidocaine  (LIDODERM ) 5 % Place 1 patch onto the skin daily. 04/26/23   [provider]  losartan  (COZAAR ) 50 MG tablet Take 50 mg by mouth daily.    [provider]  Multiple Vitamin (MULTIVITAMIN WITH MINERALS) TABS tablet Take 1 tablet by mouth daily. 05/12/23   Amin, Sumayya, MD  nitroGLYCERIN  (NITROSTAT ) 0.4 MG SL tablet Place 1 tablet (0.4 mg total) under the tongue every 5 (five) minutes as needed for chest pain. 11/11/22   Furth, Cadence H, PA-C  pantoprazole  (PROTONIX ) 20 MG tablet Take 20 mg by mouth daily.    [provider]  polyethylene glycol (MIRALAX  / GLYCOLAX ) 17 g packet Take 17 g by mouth 2 (two) times daily. 05/11/23   Caleen Qualia, MD  rosuvastatin  (CRESTOR ) 10 MG tablet TAKE 1 TABLET BY MOUTH EVERY DAY 12/19/22   Gollan, Timothy J, MD  traZODone (DESYREL) 100 MG tablet Take 100 mg by mouth at bedtime.    [provider]  TRELEGY ELLIPTA  100-62.5-25 MCG/ACT AEPB TAKE 1 PUFF BY MOUTH EVERY DAY 01/30/23   Tamea Dedra CROME, MD    Physical Exam: Vitals:   11/08/23 1622 11/08/23 1654  BP:  (!) 113/44  Pulse:  89  Resp:  (!) 27  Temp:  98.1 F (36.7 C)  SpO2:  99%  Weight: 64.4 kg   Height: 5' 5 (1.651 m)     Physical Exam Constitutional:      General: She is not in acute distress.    Appearance: Normal appearance.  HENT:      Head: Normocephalic and atraumatic.     Mouth/Throat:     Mouth: Mucous membranes are moist.     Pharynx: Oropharynx is clear.  Eyes:     Extraocular Movements: Extraocular movements intact.     Pupils: Pupils are equal, round, and reactive to light.  Cardiovascular:     Rate and Rhythm: Normal rate and regular rhythm.     Pulses: Normal pulses.     Heart sounds: Normal heart sounds.  Pulmonary:     Effort: Pulmonary effort is normal. No respiratory distress.     Breath sounds: Normal breath sounds.  Abdominal:     General: Bowel sounds are normal. There is no distension.  Palpations: Abdomen is soft.     Tenderness: There is no abdominal tenderness.  Musculoskeletal:        General: No swelling or deformity.  Skin:    General: Skin is warm and dry.  Neurological:     General: No focal deficit present.     Mental Status: Mental status is at baseline.    Labs on Admission: I have personally reviewed following labs and imaging studies  CBC: Recent Labs  Lab 11/07/23 1048 11/08/23 1633  WBC 14.1* 13.1*  NEUTROABS 12.2*  --   HGB 9.6* 8.9*  HCT 30.6* 29.3*  MCV 89.0 91.8  PLT 679* 525*    Basic Metabolic Panel: Recent Labs  Lab 11/08/23 1633  NA 133*  K 5.4*  CL 103  CO2 21*  GLUCOSE 205*  BUN 30*  CREATININE 1.06*  CALCIUM  8.4*    GFR: Estimated Creatinine Clearance: 30.5 mL/min (A) (by C-G formula based on SCr of 1.06 mg/dL (H)).  Liver Function Tests: No results for input(s): AST, ALT, ALKPHOS, BILITOT, PROT, ALBUMIN in the last 168 hours.  Urine analysis:    Component Value Date/Time   COLORURINE YELLOW (A) 05/03/2023 1322   APPEARANCEUR HAZY (A) 05/03/2023 1322   LABSPEC 1.020 05/03/2023 1322   PHURINE 5.0 05/03/2023 1322   GLUCOSEU NEGATIVE 05/03/2023 1322   HGBUR NEGATIVE 05/03/2023 1322   BILIRUBINUR NEGATIVE 05/03/2023 1322   BILIRUBINUR Negative 03/26/2021 0919   KETONESUR NEGATIVE 05/03/2023 1322   PROTEINUR NEGATIVE  05/03/2023 1322   UROBILINOGEN 0.2 03/26/2021 0919   NITRITE NEGATIVE 05/03/2023 1322   LEUKOCYTESUR MODERATE (A) 05/03/2023 1322    Radiological Exams on Admission: NM Pulmonary Perfusion Result Date: 11/08/2023 EXAM: NM Lung Perfusion Scan. CLINICAL HISTORY: Pulmonary embolism (PE) suspected, low to intermediate probability, negative D-dimer. Subacute cough, shortness of breath. TECHNIQUE: Radiolabeled MAA was administered intravenously and planar images of the lungs were obtained in multiple projections. RADIOPHARMACEUTICAL: 4.41 mCi Technetium-73m albumin aggregated (MAA) injection solution. COMPARISON: Chest radiograph 11/08/2023. FINDINGS: PERFUSION: There are 2 wedge-shaped peripheral perfusion defects in the right middle lobe. There is overall poor regional perfusion to the left lower lobe. This poor perfusion is greater than the effusion seen on comparison radiograph. IMPRESSION: 1. Two wedge-shaped peripheral perfusion defects in the right middle lobe, suspicious for pulmonary emboli. 2. Overall poor regional perfusion to the left lower lobe, out of proportion to the small effusion on chest radiograph, raising concern for additional embolic involvement. 3. Overall concern for bilateral acute Pulmonary Emboli.  high probability. These results will be called to the ordering clinician or representative by the radiologist assistant, and communication documented in the pacs or clario dashboard Electronically signed by: Norleen Boxer MD 11/08/2023 02:53 PM EDT RP Workstation: [id number]   US  Venous Img Lower Bilateral (DVT) Result Date: 11/08/2023 EXAM: ULTRASOUND DUPLEX OF THE BILATERAL LOWER EXTREMITY VEINS TECHNIQUE: Duplex ultrasound using B-mode/gray scaled imaging and Doppler spectral analysis and color flow was obtained of the deep venous structures of the bilateral lower extremity. COMPARISON: CT left lower extremity 05/22/2023. CLINICAL HISTORY: DVT. FINDINGS: LEFT: The common femoral vein,  femoral vein, popliteal vein, and posterior tibial vein of the left lower extremity demonstrate normal compressibility with normal color flow and spectral analysis. RIGHT: The common femoral vein, femoral vein, popliteal vein, and posterior tibial vein of the right lower extremity demonstrate normal compressibility with normal color flow and spectral analysis. IMPRESSION: 1. No evidence of deep venous thrombosis in the evaluated lower extremity veins. Electronically signed by:  Norleen Boxer MD 11/08/2023 02:44 PM EDT RP Workstation: [id number]   DG Chest 2 View Result Date: 11/08/2023 CLINICAL DATA:  shortness of breath EXAM: CHEST - 2 VIEW COMPARISON:  03/20/2018 FINDINGS: Small to moderate volume left pleural effusion. Left basilar airspace opacities, likely atelectasis. No pneumothorax. No cardiomegaly. Aortic atherosclerosis. Diffuse osteopenia. No acute fracture or destructive lesions. Multilevel thoracic osteophytosis. IMPRESSION: Small to moderate volume left pleural effusion with left basilar atelectasis. Electronically Signed   By: Rogelia Myers M.D.   On: 11/08/2023 12:28    EKG: Independently reviewed.  Sinus rhythm at 93 bpm.  Nonspecific T wave changes in leads I and aVL.  Assessment/Plan Principal Problem:   Acute pulmonary embolism (HCC) Active Problems:   COPD (chronic obstructive pulmonary disease) (HCC)   CAD (coronary artery disease)   GERD (gastroesophageal reflux disease)   Anemia   Mixed hyperlipidemia   Hypothyroidism   Essential hypertension, benign   Anxiety   Moderate episode of recurrent major depressive disorder (HCC)   Status post coronary artery stent placement   Acute pulmonary embolism > Workup outpatient showed elevated D-dimer and VQ scan was ordered which demonstrated bilateral wedge-shaped infarcts, high risk/positive for PE.  No elevated troponin so low suspicion for right heart strain but this is difficult to evaluate as patient had VQ scan performed and  not CT. > Not requiring oxygen.  Will observe overnight on heparin and get echocardiogram. > Leukocytosis to 13.1, believed to be reactive.  Will trend. - Monitor in progressive unit overnight - Continue with heparin - Echocardiogram - Supportive care  Hypertension - Continue home losartan   Hyperlipidemia - Continue home rosuvastatin   GERD - Continue PPI  CAD - Continue home rosuvastatin , Plavix  - Starting anticoagulation as above  Depression Anxiety - Continue home duloxetine  and trazodone  COPD Heterozygous for alpha 1 antitrypsin - Replace home Trelegy with formulary Breztri - Continue as needed albuterol   Anemia > Hemoglobin stable at 8.9 - Trend CBC  DVT prophylaxis: Heparin Code Status:   Full Family Communication:  Updated at bedside  Disposition Plan:   Patient is from:  Home  Anticipated DC to:  Home  Anticipated DC date:  1 to 2 days  Anticipated DC barriers: None  Consults called:  None Admission status:  Observation, progressive  Severity of Illness: The appropriate patient status for this patient is OBSERVATION. Observation status is judged to be reasonable and necessary in order to provide the required intensity of service to ensure the patient's safety. The patient's presenting symptoms, physical exam findings, and initial radiographic and laboratory data in the context of their medical condition is felt to place them at decreased risk for further clinical deterioration. Furthermore, it is anticipated that the patient will be medically stable for discharge from the hospital within 2 midnights of admission.    Marsa KATHEE Scurry MD [facility]  How to contact the [facility] Attending or Consulting provider 7A - 7P or covering provider during after hours 7P -7A, for this patient?   Check the care team in [facility] and look for a) attending/consulting [facility] provider listed and b) the [facility] team listed Log into [email or web address] and use [place]'s universal  password to access. If you do not have the password, please contact the hospital operator. Locate the [facility] provider you are looking for under [facility] and page to a number that you can be directly reached. If you still have difficulty reaching the provider, please page the [facility] (Director on Call) for the  Hospitalists listed on amion for assistance.  11/08/2023, 5:43 PM

## 2023-11-09 ENCOUNTER — Observation Stay (HOSPITAL_COMMUNITY)
Admit: 2023-11-09 | Discharge: 2023-11-09 | Disposition: A | Discharge status: Home / Self Care | Attending: Internal Medicine | Admitting: Internal Medicine

## 2023-11-09 DIAGNOSIS — J189 Pneumonia, unspecified organism: Secondary | ICD-10-CM | POA: Diagnosis present

## 2023-11-09 DIAGNOSIS — Z23 Encounter for immunization: Secondary | ICD-10-CM | POA: Diagnosis not present

## 2023-11-09 DIAGNOSIS — E8801 Alpha-1-antitrypsin deficiency: Secondary | ICD-10-CM | POA: Diagnosis present

## 2023-11-09 DIAGNOSIS — Z7189 Other specified counseling: Secondary | ICD-10-CM | POA: Diagnosis not present

## 2023-11-09 DIAGNOSIS — I2699 Other pulmonary embolism without acute cor pulmonale: Secondary | ICD-10-CM | POA: Diagnosis present

## 2023-11-09 DIAGNOSIS — I2609 Other pulmonary embolism with acute cor pulmonale: Secondary | ICD-10-CM

## 2023-11-09 DIAGNOSIS — I25118 Atherosclerotic heart disease of native coronary artery with other forms of angina pectoris: Secondary | ICD-10-CM | POA: Diagnosis not present

## 2023-11-09 DIAGNOSIS — Z515 Encounter for palliative care: Secondary | ICD-10-CM | POA: Diagnosis not present

## 2023-11-09 DIAGNOSIS — J449 Chronic obstructive pulmonary disease, unspecified: Secondary | ICD-10-CM | POA: Diagnosis not present

## 2023-11-09 DIAGNOSIS — I10 Essential (primary) hypertension: Secondary | ICD-10-CM | POA: Diagnosis present

## 2023-11-09 DIAGNOSIS — I2694 Multiple subsegmental pulmonary emboli without acute cor pulmonale: Secondary | ICD-10-CM | POA: Diagnosis present

## 2023-11-09 DIAGNOSIS — X58XXXA Exposure to other specified factors, initial encounter: Secondary | ICD-10-CM | POA: Diagnosis present

## 2023-11-09 DIAGNOSIS — F331 Major depressive disorder, recurrent, moderate: Secondary | ICD-10-CM | POA: Diagnosis present

## 2023-11-09 DIAGNOSIS — K219 Gastro-esophageal reflux disease without esophagitis: Secondary | ICD-10-CM | POA: Diagnosis not present

## 2023-11-09 DIAGNOSIS — T45515A Adverse effect of anticoagulants, initial encounter: Secondary | ICD-10-CM | POA: Diagnosis present

## 2023-11-09 DIAGNOSIS — D6832 Hemorrhagic disorder due to extrinsic circulating anticoagulants: Secondary | ICD-10-CM | POA: Diagnosis not present

## 2023-11-09 DIAGNOSIS — J432 Centrilobular emphysema: Secondary | ICD-10-CM | POA: Diagnosis present

## 2023-11-09 DIAGNOSIS — M79604 Pain in right leg: Secondary | ICD-10-CM | POA: Diagnosis not present

## 2023-11-09 DIAGNOSIS — F419 Anxiety disorder, unspecified: Secondary | ICD-10-CM | POA: Diagnosis present

## 2023-11-09 DIAGNOSIS — E039 Hypothyroidism, unspecified: Secondary | ICD-10-CM | POA: Diagnosis present

## 2023-11-09 DIAGNOSIS — J440 Chronic obstructive pulmonary disease with acute lower respiratory infection: Secondary | ICD-10-CM | POA: Diagnosis present

## 2023-11-09 DIAGNOSIS — Z7901 Long term (current) use of anticoagulants: Secondary | ICD-10-CM | POA: Diagnosis not present

## 2023-11-09 DIAGNOSIS — Z7902 Long term (current) use of antithrombotics/antiplatelets: Secondary | ICD-10-CM | POA: Diagnosis not present

## 2023-11-09 DIAGNOSIS — Z66 Do not resuscitate: Secondary | ICD-10-CM | POA: Diagnosis present

## 2023-11-09 DIAGNOSIS — S82832A Other fracture of upper and lower end of left fibula, initial encounter for closed fracture: Secondary | ICD-10-CM | POA: Diagnosis present

## 2023-11-09 DIAGNOSIS — K5900 Constipation, unspecified: Secondary | ICD-10-CM | POA: Diagnosis not present

## 2023-11-09 DIAGNOSIS — D62 Acute posthemorrhagic anemia: Secondary | ICD-10-CM | POA: Diagnosis not present

## 2023-11-09 DIAGNOSIS — E871 Hypo-osmolality and hyponatremia: Secondary | ICD-10-CM | POA: Diagnosis present

## 2023-11-09 DIAGNOSIS — L89151 Pressure ulcer of sacral region, stage 1: Secondary | ICD-10-CM | POA: Diagnosis present

## 2023-11-09 DIAGNOSIS — E782 Mixed hyperlipidemia: Secondary | ICD-10-CM | POA: Diagnosis present

## 2023-11-09 DIAGNOSIS — K921 Melena: Secondary | ICD-10-CM | POA: Diagnosis not present

## 2023-11-09 DIAGNOSIS — E875 Hyperkalemia: Secondary | ICD-10-CM | POA: Diagnosis not present

## 2023-11-09 DIAGNOSIS — M79661 Pain in right lower leg: Secondary | ICD-10-CM | POA: Diagnosis not present

## 2023-11-09 LAB — ECHOCARDIOGRAM COMPLETE
AR max vel: 3.48 cm2
AV Area VTI: 3.81 cm2
AV Area mean vel: 3.77 cm2
AV Mean grad: 9.5 mmHg
AV Peak grad: 15.4 mmHg
Ao pk vel: 1.96 m/s
Area-P 1/2: 4.33 cm2
Height: 65 in
MV VTI: 4.59 cm2
S' Lateral: 2.2 cm
Weight: 2271.62 [oz_av]

## 2023-11-09 LAB — COMPREHENSIVE METABOLIC PANEL WITH GFR
ALT: 29 U/L (ref 0–44)
AST: 24 U/L (ref 15–41)
Albumin: 1.6 g/dL — ABNORMAL LOW (ref 3.5–5.0)
Alkaline Phosphatase: 129 U/L — ABNORMAL HIGH (ref 38–126)
Anion gap: 11 (ref 5–15)
BUN: 28 mg/dL — ABNORMAL HIGH (ref 8–23)
CO2: 20 mmol/L — ABNORMAL LOW (ref 22–32)
Calcium: 8.1 mg/dL — ABNORMAL LOW (ref 8.9–10.3)
Chloride: 105 mmol/L (ref 98–111)
Creatinine, Ser: 0.92 mg/dL (ref 0.44–1.00)
GFR, Estimated: 58 mL/min — ABNORMAL LOW (ref >60)
Glucose, Bld: 115 mg/dL — ABNORMAL HIGH (ref 70–99)
Potassium: 5.4 mmol/L — ABNORMAL HIGH (ref 3.5–5.1)
Sodium: 136 mmol/L (ref 135–145)
Total Bilirubin: 0.5 mg/dL (ref 0.0–1.2)
Total Protein: 5.4 g/dL — ABNORMAL LOW (ref 6.5–8.1)

## 2023-11-09 LAB — CBC
HCT: 24.3 % — ABNORMAL LOW (ref 36.0–46.0)
Hemoglobin: 7.8 g/dL — ABNORMAL LOW (ref 12.0–15.0)
MCH: 28.7 pg (ref 26.0–34.0)
MCHC: 32.1 g/dL (ref 30.0–36.0)
MCV: 89.3 fL (ref 80.0–100.0)
Platelets: 379 K/uL (ref 150–400)
RBC: 2.72 MIL/uL — ABNORMAL LOW (ref 3.87–5.11)
RDW: 16.7 % — ABNORMAL HIGH (ref 11.5–15.5)
WBC: 8.4 K/uL (ref 4.0–10.5)
nRBC: 0 % (ref 0.0–0.2)

## 2023-11-09 LAB — HEPARIN LEVEL (UNFRACTIONATED)
Heparin Unfractionated: 0.25 [IU]/mL — ABNORMAL LOW (ref 0.30–0.70)
Heparin Unfractionated: 0.25 [IU]/mL — ABNORMAL LOW (ref 0.30–0.70)
Heparin Unfractionated: 0.44 [IU]/mL (ref 0.30–0.70)

## 2023-11-09 MED ORDER — SODIUM ZIRCONIUM CYCLOSILICATE 10 G PO PACK
10.0000 g | PACK | Freq: Once | ORAL | Status: AC
  Administered 2023-11-09: 10 g via ORAL
  Filled 2023-11-09: qty 1

## 2023-11-09 MED ORDER — HEPARIN BOLUS VIA INFUSION
950.0000 [IU] | Freq: Once | INTRAVENOUS | Status: AC
  Administered 2023-11-09: 950 [IU] via INTRAVENOUS
  Filled 2023-11-09: qty 950

## 2023-11-09 NOTE — ED Notes (Signed)
 This RN placed pt on bed pan. Pt urinated and had a large BM. Pt was cleaned and a new brief was placed. Pt repositioned in bed and set up for dinner. Call light w/i reach.

## 2023-11-09 NOTE — Progress Notes (Signed)
 PHARMACY - ANTICOAGULATION CONSULT NOTE  Pharmacy Consult for Heparin Infusion  Indication: pulmonary embolus  Allergies  Allergen Reactions   Shellfish Allergy Anaphylaxis    Ended up in the ED after eating shellfish, caused diarrhea and severe GI upset , unsure if caused SOB   Shellfish-Derived Products     Other reaction(s): Not available   Sulfa Antibiotics     Patient Measurements: Height: 5' 5 (165.1 cm) Weight: 64.4 kg (141 lb 15.6 oz) IBW/kg (Calculated) : 57 HEPARIN DW (KG): 64.4  Vital Signs: Temp: 98.4 F (36.9 C) (10/09 0025) Temp Source: Oral (10/09 0025) BP: 109/49 (10/09 0025) Pulse Rate: 93 (10/09 0025)  Labs: Recent Labs    11/07/23 1048 11/08/23 1633 11/08/23 1851 11/09/23 0208  HGB 9.6* 8.9*  --  7.8*  HCT 30.6* 29.3*  --  24.3*  PLT 679* 525*  --  379  APTT  --  29  --   --   LABPROT  --  14.0  --   --   INR  --  1.0  --   --   HEPARINUNFRC  --   --   --  0.25*  CREATININE  --  1.06*  --  0.92  TROPONINIHS  --  8 8  --     Estimated Creatinine Clearance: 35.1 mL/min (by C-G formula based on SCr of 0.92 mg/dL).   Medical History: Past Medical History:  Diagnosis Date   Anemia    ASCVD (arteriosclerotic cardiovascular disease)    COPD (chronic obstructive pulmonary disease) (HCC)    Diverticulosis    GERD (gastroesophageal reflux disease)    Hypertension    Hypothyroidism    Tibial fracture 05/03/2023     Assessment: 89 yo female with PMH of HTN, HLD, Hypothyroidism, GERD, CAD, Depression, COPD, and Anemia. Outpatient VQ scan positive for multiple pulmonary emboli. Pharmacy consulted for heparin infusion dosing and monitoring.   Goal of Therapy:  Heparin level 0.3-0.7 units/ml Monitor platelets by anticoagulation protocol: Yes   Plan:  10/9:  HL @ 0208 = 0.25, SUBtherapeutic  - will order heparin 950 units IV X 1 bolus and increase drip rate to 1150 units/hr - recheck HL 8 hrs after rate change  - CBC daily   Fitzhugh Vizcarrondo  D, PharmD Clinical Pharmacist 11/09/2023 2:53 AM

## 2023-11-09 NOTE — Progress Notes (Signed)
*  PRELIMINARY RESULTS* Echocardiogram 2D Echocardiogram has been performed.  Floydene Harder 11/09/2023, 11:15 AM

## 2023-11-09 NOTE — ED Notes (Signed)
Pt set up with lunch tray  

## 2023-11-09 NOTE — Progress Notes (Signed)
 PHARMACY - ANTICOAGULATION CONSULT NOTE  Pharmacy Consult for Heparin Infusion  Indication: pulmonary embolus  Allergies  Allergen Reactions   Shellfish Allergy Anaphylaxis    Ended up in the ED after eating shellfish, caused diarrhea and severe GI upset , unsure if caused SOB   Shellfish Protein-Containing Drug Products     Other reaction(s): Not available   Sulfa Antibiotics     Patient Measurements: Height: 5' 5 (165.1 cm) Weight: 64.4 kg (141 lb 15.6 oz) IBW/kg (Calculated) : 57 HEPARIN DW (KG): 64.4  Vital Signs: Temp: 98.4 F (36.9 C) (10/09 0600) Temp Source: Oral (10/09 0600) BP: 128/60 (10/09 1000) Pulse Rate: 94 (10/09 1000)  Labs: Recent Labs    11/07/23 1048 11/08/23 1633 11/08/23 1851 11/09/23 0208 11/09/23 1123  HGB 9.6* 8.9*  --  7.8*  --   HCT 30.6* 29.3*  --  24.3*  --   PLT 679* 525*  --  379  --   APTT  --  29  --   --   --   LABPROT  --  14.0  --   --   --   INR  --  1.0  --   --   --   HEPARINUNFRC  --   --   --  0.25* 0.25*  CREATININE  --  1.06*  --  0.92  --   TROPONINIHS  --  8 8  --   --     Estimated Creatinine Clearance: 35.1 mL/min (by C-G formula based on SCr of 0.92 mg/dL).   Medical History: Past Medical History:  Diagnosis Date   Anemia    ASCVD (arteriosclerotic cardiovascular disease)    COPD (chronic obstructive pulmonary disease) (HCC)    Diverticulosis    GERD (gastroesophageal reflux disease)    Hypertension    Hypothyroidism    Tibial fracture 05/03/2023     Assessment: 89 yo female with PMH of HTN, HLD, Hypothyroidism, GERD, CAD, Depression, COPD, and Anemia. Outpatient VQ scan positive for multiple pulmonary emboli. Pharmacy consulted for heparin infusion dosing and monitoring.   Goal of Therapy:  Heparin level 0.3-0.7 units/ml Monitor platelets by anticoagulation protocol: Yes  10/9: HL @ 0208 = 0.25, SUBtherapeutic 10/9: HL @ 1123 = 0.25, SUBtherapeutic at 1150 un/hr   Plan:  10/9:  HL @ 0208 =  0.25, SUBtherapeutic  - will order heparin 950 units IV X 1 bolus and increase drip rate to 1300 units/hr - recheck HL 8 hrs after rate change  - CBC daily   Will M. Lenon, PharmD, BCPS Clinical Pharmacist 11/09/2023 12:04 PM

## 2023-11-09 NOTE — ED Notes (Signed)
 This tech and [place] EDT assisted pt onto bedpan. When pt rolled over onto bedpan, this tech noticed urine and stool in brief. Pt was cleaned. Pt also urinated in bedpan. Peri care was performed, new chux and brief were placed, and pt was adjusted in bed. No other needs verbalized at this time.

## 2023-11-09 NOTE — ED Notes (Signed)
 This RN assisted Pt with setting up meal tray.

## 2023-11-09 NOTE — Progress Notes (Signed)
 PHARMACY - ANTICOAGULATION CONSULT NOTE  Pharmacy Consult for Heparin Infusion  Indication: pulmonary embolus  Allergies  Allergen Reactions   Shellfish Allergy Anaphylaxis    Ended up in the ED after eating shellfish, caused diarrhea and severe GI upset , unsure if caused SOB   Shellfish Protein-Containing Drug Products     Other reaction(s): Not available   Sulfa Antibiotics     Patient Measurements: Height: 5' 5 (165.1 cm) Weight: 64.4 kg (141 lb 15.6 oz) IBW/kg (Calculated) : 57 HEPARIN DW (KG): 64.4  Vital Signs: Temp: 98.9 F (37.2 C) (10/09 1542) BP: 119/88 (10/09 1800) Pulse Rate: 95 (10/09 1800)  Labs: Recent Labs    11/07/23 1048 11/08/23 1633 11/08/23 1851 11/09/23 0208 11/09/23 1123 11/09/23 2016  HGB 9.6* 8.9*  --  7.8*  --   --   HCT 30.6* 29.3*  --  24.3*  --   --   PLT 679* 525*  --  379  --   --   APTT  --  29  --   --   --   --   LABPROT  --  14.0  --   --   --   --   INR  --  1.0  --   --   --   --   HEPARINUNFRC  --   --   --  0.25* 0.25* 0.44  CREATININE  --  1.06*  --  0.92  --   --   TROPONINIHS  --  8 8  --   --   --     Estimated Creatinine Clearance: 35.1 mL/min (by C-G formula based on SCr of 0.92 mg/dL).   Medical History: Past Medical History:  Diagnosis Date   Anemia    ASCVD (arteriosclerotic cardiovascular disease)    COPD (chronic obstructive pulmonary disease) (HCC)    Diverticulosis    GERD (gastroesophageal reflux disease)    Hypertension    Hypothyroidism    Tibial fracture 05/03/2023     Assessment: 89 yo female with PMH of HTN, HLD, Hypothyroidism, GERD, CAD, Depression, COPD, and Anemia. Outpatient VQ scan positive for multiple pulmonary emboli. Pharmacy consulted for heparin infusion dosing and monitoring.   Goal of Therapy:  Heparin level 0.3-0.7 units/ml Monitor platelets by anticoagulation protocol: Yes  10/9: HL @ 0208 = 0.25, SUBtherapeutic 10/9: HL @ 1123 = 0.25, SUBtherapeutic at 1150 un/hr    Plan: heparin level therapeutic x 1 - will continue heparin infusion at 1300 units/hr - recheck heparin level in 8 hours to confirm - CBC daily   Adriana Bolster, PharmD, BCPS Clinical Pharmacist 11/09/2023 8:48 PM

## 2023-11-09 NOTE — Progress Notes (Signed)
 Progress Note   Patient: Terri Braun FMW:[id number] DOB: [birth year] DOA: 11/08/2023     0 DOS: the patient was seen and examined on 11/09/2023   Brief hospital course: From HPI Venda Dice is a 89 y.o. female with medical history significant of hypertension, hyperlipidemia, hypothyroidism, GERD, CAD status post stent, depression, anxiety, alpha 1 antitrypsin, COPD, anemia presenting with cough/shortness of breath/PE.   Patient has had some cough and shortness of breath though similar to her baseline shortness of breath for the past 2 to 3 weeks.  Saw pulmonologist yesterday and had a chest x-ray and labs done.  Labs showed elevated D-dimer and patient went for VQ scan today which was positive bilaterally.  DVT study also ordered which was negative.  Patient sent to the ED for PE.   Patient denies fevers, chills, chest pain, abdominal pain, constipation, diarrhea, nausea, vomiting.   ED Course: Vital signs in the ED notable for blood pressure in the 110s-120 systolic, respirate in the 20s.    Lab workup included BMP with sodium 133, potassium 5.4, bicarb 21, glucose 205, BUN 30, creatinine stable at 1.06, calcium  8.4.   CBC with leukocytosis of 13.1, hemoglobin stable at 8.9, platelets 525.  PT, INR, PTT normal.  Troponin normal.   Chest x-ray earlier today with left pleural effusion with atelectasis.  VQ scan showing the 2 wedge-shaped perfusion deficits consistent with positive/high risk PE.  DVT study negative bilaterally.   Patient started on heparin infusion and received DuoNeb in the ED.     Assessment and Plan:   Acute pulmonary embolism > Workup outpatient showed elevated D-dimer and VQ scan was ordered which demonstrated bilateral wedge-shaped infarcts, high risk/positive for PE.  No elevated troponin so low suspicion for right heart strain but this is difficult to evaluate as patient had VQ scan performed and not CT. > Not requiring oxygen.  Will observe overnight on  heparin and get echocardiogram. > Leukocytosis to 13.1, believed to be reactive.  Will trend. - Monitor in progressive unit overnight - Continue with heparin - Follow-up on echocardiogram - Supportive care   Hyperkalemia-status post Lokelma  Hypertension - Continue home losartan    Hyperlipidemia - Continue home rosuvastatin    GERD - Continue PPI   CAD - Continue home rosuvastatin , Plavix  - Starting anticoagulation as above   Depression Anxiety - Continue home duloxetine  and trazodone   COPD Heterozygous for alpha 1 antitrypsin - Replace home Trelegy with formulary Breztri - Continue as needed albuterol    Anemia > Hemoglobin stable at 8.9 - Trend CBC   DVT prophylaxis:      Heparin Code Status:              Full Family Communication:       Updated at bedside  Disposition Plan:    Subjective:  Denies nausea vomiting chest pain or cough Noted to have high potassium and received Lokelma  Physical Exam:  Constitutional:      General: She is not in acute distress.    Appearance: Normal appearance.  HENT:     Head: Normocephalic and atraumatic.     Mouth/Throat:     Mouth: Mucous membranes are moist.     Pharynx: Oropharynx is clear.  Eyes:     Extraocular Movements: Extraocular movements intact.     Pupils: Pupils are equal, round, and reactive to light.  Cardiovascular:     Rate and Rhythm: Normal rate and regular rhythm.     Pulses: Normal pulses.  Heart sounds: Normal heart sounds.  Pulmonary:     Effort: Pulmonary effort is normal. No respiratory distress.     Breath sounds: Normal breath sounds.  Abdominal:     General: Bowel sounds are normal. There is no distension.     Palpations: Abdomen is soft.     Tenderness: There is no abdominal tenderness.  Musculoskeletal:        General: No swelling or deformity.  Skin:    General: Skin is warm and dry.  Neurological:     General: No focal deficit present.     Mental Status: Mental status is at  baseline.   Vitals:   11/09/23 1200 11/09/23 1400 11/09/23 1542 11/09/23 1600  BP: (!) 125/51 (!) 122/52  (!) 122/42  Pulse: 93 89  90  Resp: 19 (!) 25  (!) 30  Temp:   98.9 F (37.2 C)   TempSrc:      SpO2: 94% 96%  95%  Weight:      Height:          Latest Ref Rng & Units 11/09/2023    2:08 AM 11/08/2023    4:33 PM 05/03/2023    2:24 AM  BMP  Glucose 70 - 99 mg/dL 884  794  879   BUN 8 - 23 mg/dL 28  30  28    Creatinine 0.44 - 1.00 mg/dL 9.07  8.93  9.00   Sodium 135 - 145 mmol/L 136  133  137   Potassium 3.5 - 5.1 mmol/L 5.4  5.4  4.3   Chloride 98 - 111 mmol/L 105  103  104   CO2 22 - 32 mmol/L 20  21  25    Calcium  8.9 - 10.3 mg/dL 8.1  8.4  8.8        Latest Ref Rng & Units 11/09/2023    2:08 AM 11/08/2023    4:33 PM 11/07/2023   10:48 AM  CBC  WBC 4.0 - 10.5 K/uL 8.4  13.1  14.1   Hemoglobin 12.0 - 15.0 g/dL 7.8  8.9  9.6   Hematocrit 36.0 - 46.0 % 24.3  29.3  30.6   Platelets 150 - 400 K/uL 379  525  679      Author: Drue ONEIDA Potter, MD 11/09/2023 4:52 PM  For on call review [email or web address].

## 2023-11-10 DIAGNOSIS — I2699 Other pulmonary embolism without acute cor pulmonale: Secondary | ICD-10-CM | POA: Diagnosis not present

## 2023-11-10 LAB — CBC WITH DIFFERENTIAL/PLATELET
Abs Immature Granulocytes: 0.19 K/uL — ABNORMAL HIGH (ref 0.00–0.07)
Basophils Absolute: 0 K/uL (ref 0.0–0.1)
Basophils Relative: 0 %
Eosinophils Absolute: 0.2 K/uL (ref 0.0–0.5)
Eosinophils Relative: 2 %
HCT: 24 % — ABNORMAL LOW (ref 36.0–46.0)
Hemoglobin: 7.8 g/dL — ABNORMAL LOW (ref 12.0–15.0)
Immature Granulocytes: 2 %
Lymphocytes Relative: 8 %
Lymphs Abs: 0.8 K/uL (ref 0.7–4.0)
MCH: 28.8 pg (ref 26.0–34.0)
MCHC: 32.5 g/dL (ref 30.0–36.0)
MCV: 88.6 fL (ref 80.0–100.0)
Monocytes Absolute: 0.8 K/uL (ref 0.1–1.0)
Monocytes Relative: 8 %
Neutro Abs: 7.3 K/uL (ref 1.7–7.7)
Neutrophils Relative %: 80 %
Platelets: 359 K/uL (ref 150–400)
RBC: 2.71 MIL/uL — ABNORMAL LOW (ref 3.87–5.11)
RDW: 16.8 % — ABNORMAL HIGH (ref 11.5–15.5)
WBC: 9.3 K/uL (ref 4.0–10.5)
nRBC: 0 % (ref 0.0–0.2)

## 2023-11-10 LAB — BASIC METABOLIC PANEL WITH GFR
Anion gap: 5 (ref 5–15)
BUN: 27 mg/dL — ABNORMAL HIGH (ref 8–23)
CO2: 25 mmol/L (ref 22–32)
Calcium: 8.5 mg/dL — ABNORMAL LOW (ref 8.9–10.3)
Chloride: 102 mmol/L (ref 98–111)
Creatinine, Ser: 0.82 mg/dL (ref 0.44–1.00)
GFR, Estimated: >60 mL/min (ref >60)
Glucose, Bld: 117 mg/dL — ABNORMAL HIGH (ref 70–99)
Potassium: 4.7 mmol/L (ref 3.5–5.1)
Sodium: 132 mmol/L — ABNORMAL LOW (ref 135–145)

## 2023-11-10 LAB — HEPARIN LEVEL (UNFRACTIONATED): Heparin Unfractionated: 0.4 [IU]/mL (ref 0.30–0.70)

## 2023-11-10 MED ORDER — APIXABAN 5 MG PO TABS: 5.0000 mg | ORAL_TABLET | Freq: Two times a day (BID) | ORAL | Status: DC

## 2023-11-10 MED ORDER — APIXABAN 5 MG PO TABS: 10.0000 mg | ORAL_TABLET | Freq: Two times a day (BID) | ORAL | Status: DC

## 2023-11-10 NOTE — TOC Initial Note (Signed)
 Transition of Care [facility]) - Initial/Assessment Note    Patient Details  Name: Terri Braun MRN: [id number] Date of Birth: 12-22-1929  [facility]) CM/SW Contact:    [place] L Shayan Bramhall, LCSW Phone Number: 11/10/2023, 3:20 PM  Clinical Narrative:                  CSW received a call from Suzen Oman, [facility]. Suzen advised that patient was at their SNF and the daughter was advised to locate LTC placement for her mother but she did not. She stated that the daughter was private paying up until recently. She stated that they may be able to receive the patient back but they can't guarantee a bed.    10:59am: CSW spoke with [name] Foots, daughter of patient. CSW and daughter discussed discharge planning.  Patient was at the rehab from 4/10 to 5/4. Daughter advised that she is interested in LTC placement. CSW advised that TOC will search for beds for short term placement and it is up to the family to locate LTC placements.   CSW inquired as to why patient can't discharge to daughter home. Ms. Foots advised that her spouse is against it due to her mother's behaviors. She advised that she nor her husband are in a position to provide care.   CSW offered choice. CSW sent Ms. Rainbow a listing of facilities and the ratings. Ms. Foots advised that she would like [facility] and [facility] and rehab. She advised that she will review the email CSW sent and if there are more facilities, she will contact CSW.   FL2 will be completed.          Patient Goals and CMS Choice            Expected Discharge Plan and Services                                              Prior Living Arrangements/Services                       Activities of Daily Living   ADL Screening (condition at time of admission) Independently performs ADLs?: No Does the patient have a NEW difficulty with bathing/dressing/toileting/self-feeding that is expected  to last >3 days?: No Does the patient have a NEW difficulty with getting in/out of bed, walking, or climbing stairs that is expected to last >3 days?: No Does the patient have a NEW difficulty with communication that is expected to last >3 days?: No Is the patient deaf or have difficulty hearing?: No Does the patient have difficulty seeing, even when wearing glasses/contacts?: No Does the patient have difficulty concentrating, remembering, or making decisions?: Yes  Permission Sought/Granted                  Emotional Assessment              Admission diagnosis:  Acute pulmonary embolism (HCC) [I26.99] Multiple subsegmental pulmonary emboli without acute cor pulmonale (HCC) [I26.94] Patient Active Problem List   Diagnosis Date Noted   Acute pulmonary embolism (HCC) 11/08/2023   Frequent falls 05/12/2023   Closed fracture of proximal end of left fibula 05/05/2023   Closed nondisplaced oblique fracture of shaft of left tibia 05/05/2023   UTI (urinary tract infection) 05/04/2023   COPD (chronic obstructive pulmonary disease) (  HCC)    Tibial plateau fracture 05/03/2023   Moderate persistent asthma without complication 03/10/2022   Olecranon bursitis of right elbow 08/23/2021   Leg hematoma, left, initial encounter 05/03/2021   Syncope 05/03/2021   Osteoarthritis of left knee 04/12/2021   Leukocytes in urine 12/13/2019   Urinary frequency 12/13/2019   Closed fracture of metatarsal bone 12/25/2018   Polyp of ascending colon 02/16/2018   Diverticula, colon 02/16/2018   Hypothyroidism 02/16/2018   Essential hypertension, benign 02/16/2018   ASCVD (arteriosclerotic cardiovascular disease) 02/16/2018   Periumbilical abdominal pain 02/16/2018   GI bleed 02/16/2018   Iron deficiency anemia, unspecified 02/16/2018   Chronic liver disease 02/16/2018   Carrier of alpha-1-antitrypsin deficiency 02/16/2018   History of colonic polyps 02/16/2018   Abnormal liver CT 02/16/2018    Elevated liver enzymes 02/16/2018   History of hepatitis C 02/16/2018   Abnormal findings on imaging test 02/16/2018   Helicobacter pylori gastritis 02/16/2018   Gait instability 02/13/2018   Mixed hyperlipidemia 02/13/2018   Atherosclerosis of native coronary artery of native heart with stable angina pectoris 02/12/2018   Centrilobular emphysema (HCC) 02/12/2018   Anemia 02/12/2018   Benign neoplasm of cecum 01/16/2018   Heterozygous alpha 1-antitrypsin deficiency (HCC) 01/16/2018   GERD (gastroesophageal reflux disease) 01/16/2018   Bronchiectasis without complication (HCC) 05/16/2016   Moderate episode of recurrent major depressive disorder (HCC) 04/14/2016   Bloody stool 09/15/2015   Left lower quadrant pain 09/15/2015   Long term (current) use of antithrombotics/antiplatelets 09/15/2015   Dyspnea 05/18/2015   Anxiety 11/14/2011   DJD (degenerative joint disease) 11/14/2011   Abnormal stress test 04/20/2010   Status post coronary artery stent placement 04/20/2010   CAD (coronary artery disease) 04/20/2010   PCP:  Lenon Layman ORN, MD Pharmacy:   [facility] [place], [place] - [address] [place] [address] Phone: [phone] Fax: [phone]     Social Drivers of Health (SDOH) Social History: SDOH Screenings   Food Insecurity: No Food Insecurity (11/10/2023)  Housing: Low Risk  (11/10/2023)  Transportation Needs: No Transportation Needs (11/10/2023)  Utilities: Not At Risk (11/10/2023)  Alcohol Screen: Low Risk  (09/29/2021)  Depression (PHQ2-9): Low Risk  (09/29/2021)  Financial Resource Strain: Low Risk  (08/11/2023)   Received from [facility]  Physical Activity: Insufficiently Active (09/29/2021)  Social Connections: Moderately Isolated (11/10/2023)  Stress: No Stress Concern Present (09/29/2021)  Tobacco Use: Low Risk  (11/08/2023)   SDOH Interventions:     Readmission Risk  Interventions     No data to display

## 2023-11-10 NOTE — [place] FL2 (Signed)
 [place]  MEDICAID [id number] LEVEL OF CARE FORM     IDENTIFICATION  Patient Name: Terri Braun Birthdate: 05/11/1929 Sex: female Admission Date (Current Location): 11/08/2023  [facility] and [place] Number:  Chiropodist and Address:  [facility], [address], [place], [place] [address]      Provider Number: [id number]  Attending Physician Name and Address:  Dorinda Drue DASEN, MD  Relative Name and Phone Number:  [name] Foots [phone]    Current Level of Care: Hospital Recommended Level of Care: Skilled Nursing Facility Prior Approval Number:    Date Approved/Denied: 08/18/23 PASRR Number: [id number]  Discharge Plan: SNF    Current Diagnoses: Patient Active Problem List   Diagnosis Date Noted   Acute pulmonary embolism (HCC) 11/08/2023   Frequent falls 05/12/2023   Closed fracture of proximal end of left fibula 05/05/2023   Closed nondisplaced oblique fracture of shaft of left tibia 05/05/2023   UTI (urinary tract infection) 05/04/2023   COPD (chronic obstructive pulmonary disease) (HCC)    Tibial plateau fracture 05/03/2023   Moderate persistent asthma without complication 03/10/2022   Olecranon bursitis of right elbow 08/23/2021   Leg hematoma, left, initial encounter 05/03/2021   Syncope 05/03/2021   Osteoarthritis of left knee 04/12/2021   Leukocytes in urine 12/13/2019   Urinary frequency 12/13/2019   Closed fracture of metatarsal bone 12/25/2018   Polyp of ascending colon 02/16/2018   Diverticula, colon 02/16/2018   Hypothyroidism 02/16/2018   Essential hypertension, benign 02/16/2018   ASCVD (arteriosclerotic cardiovascular disease) 02/16/2018   Periumbilical abdominal pain 02/16/2018   GI bleed 02/16/2018   Iron deficiency anemia, unspecified 02/16/2018   Chronic liver disease 02/16/2018   Carrier of alpha-1-antitrypsin deficiency 02/16/2018   History of colonic polyps 02/16/2018   Abnormal liver CT 02/16/2018    Elevated liver enzymes 02/16/2018   History of hepatitis C 02/16/2018   Abnormal findings on imaging test 02/16/2018   Helicobacter pylori gastritis 02/16/2018   Gait instability 02/13/2018   Mixed hyperlipidemia 02/13/2018   Atherosclerosis of native coronary artery of native heart with stable angina pectoris 02/12/2018   Centrilobular emphysema (HCC) 02/12/2018   Anemia 02/12/2018   Benign neoplasm of cecum 01/16/2018   Heterozygous alpha 1-antitrypsin deficiency (HCC) 01/16/2018   GERD (gastroesophageal reflux disease) 01/16/2018   Bronchiectasis without complication (HCC) 05/16/2016   Moderate episode of recurrent major depressive disorder (HCC) 04/14/2016   Bloody stool 09/15/2015   Left lower quadrant pain 09/15/2015   Long term (current) use of antithrombotics/antiplatelets 09/15/2015   Dyspnea 05/18/2015   Anxiety 11/14/2011   DJD (degenerative joint disease) 11/14/2011   Abnormal stress test 04/20/2010   Status post coronary artery stent placement 04/20/2010   CAD (coronary artery disease) 04/20/2010    Orientation RESPIRATION BLADDER Height & Weight        Normal Continent Weight: 141 lb 15.6 oz (64.4 kg) Height:  5' 5 (165.1 cm)  BEHAVIORAL SYMPTOMS/MOOD NEUROLOGICAL BOWEL NUTRITION STATUS     (Mental Status at Baseline.) Continent Diet (Diet regular Room service appropriate? Yes; Fluid consistency: Thin: General starting at 10/08 1736)  AMBULATORY STATUS COMMUNICATION OF NEEDS Skin   Extensive Assist Verbally Normal                       Personal Care Assistance Level of Assistance  Bathing, Feeding, Dressing Bathing Assistance: Maximum assistance Feeding assistance: Limited assistance Dressing Assistance: Maximum assistance     Functional Limitations Info  Sight, Hearing, Speech  SPECIAL CARE FACTORS FREQUENCY  PT (By licensed PT), OT (By licensed OT)     PT Frequency: 2X OT Frequency: 2X            Contractures      Additional  Factors Info  Code Status, Allergies Code Status Info: FULL Allergies Info: Shellfish           Current Medications (11/10/2023):  This is the current hospital active medication list Current Facility-Administered Medications  Medication Dose Route Frequency Provider Last Rate Last Admin   acetaminophen  (TYLENOL ) tablet 650 mg  650 mg Oral Q6H PRN Seena Marsa NOVAK, MD   650 mg at 11/10/23 1500   Or   acetaminophen  (TYLENOL ) suppository 650 mg  650 mg Rectal Q6H PRN Seena Marsa NOVAK, MD       albuterol  (PROVENTIL ) (2.5 MG/3ML) 0.083% nebulizer solution 2.5 mg  2.5 mg Inhalation Q6H PRN Seena Marsa NOVAK, MD   2.5 mg at 11/08/23 2045   budesonide-glycopyrrolate-formoterol (BREZTRI) 160-9-4.8 MCG/ACT inhaler 2 puff  2 puff Inhalation BID Melvin, Alexander B, MD   2 puff at 11/10/23 1050   celecoxib  (CELEBREX ) capsule 100 mg  100 mg Oral Daily Melvin, Alexander B, MD   100 mg at 11/10/23 1049   clopidogrel  (PLAVIX ) tablet 75 mg  75 mg Oral Daily Melvin, Alexander B, MD   75 mg at 11/10/23 1049   DULoxetine  (CYMBALTA ) DR capsule 60 mg  60 mg Oral Daily Melvin, Alexander B, MD   60 mg at 11/10/23 1048   feeding supplement (ENSURE ENLIVE / ENSURE PLUS) liquid 237 mL  237 mL Oral BID BM Melvin, Alexander B, MD   237 mL at 11/10/23 1455   heparin ADULT infusion 100 units/mL (25000 units/250mL)  1,300 Units/hr Intravenous Continuous Lenon Elsie HERO, RPH 13 mL/hr at 11/09/23 2302 1,300 Units/hr at 11/09/23 2302   isosorbide  mononitrate (IMDUR ) 24 hr tablet 30 mg  30 mg Oral Daily Melvin, Alexander B, MD   30 mg at 11/10/23 1048   losartan  (COZAAR ) tablet 50 mg  50 mg Oral Daily Melvin, Alexander B, MD   50 mg at 11/10/23 1048   multivitamin with minerals tablet 1 tablet  1 tablet Oral Daily Melvin, Alexander B, MD   1 tablet at 11/10/23 1049   pantoprazole  (PROTONIX ) EC tablet 20 mg  20 mg Oral Daily Melvin, Alexander B, MD   20 mg at 11/10/23 1049   polyethylene glycol (MIRALAX  / GLYCOLAX )  packet 17 g  17 g Oral Daily PRN Melvin, Alexander B, MD       rosuvastatin  (CRESTOR ) tablet 10 mg  10 mg Oral QHS Melvin, Alexander B, MD   10 mg at 11/09/23 2306   sodium chloride  flush (NS) 0.9 % injection 3 mL  3 mL Intravenous Q12H Seena Marsa NOVAK, MD   3 mL at 11/10/23 1050   traZODone (DESYREL) tablet 100 mg  100 mg Oral QHS Melvin, Alexander B, MD   100 mg at 11/09/23 2306     Discharge Medications: Please see discharge summary for a list of discharge medications.  Relevant Imaging Results:  Relevant Lab Results:   Additional Information [id number]  [place] L Teigen Parslow, LCSW

## 2023-11-10 NOTE — Evaluation (Signed)
 Occupational Therapy Evaluation Patient Details Name: Terri Braun MRN: [id number] DOB: 04-Feb-1929 Today's Date: 11/10/2023   History of Present Illness   Pt is a 89 y.o. female with medical history significant of hypertension, hyperlipidemia, hypothyroidism, GERD, CAD status post stent, depression, anxiety, alpha 1 antitrypsin, COPD, anemia presenting with cough/shortness of breath and found to have acute PE.     Clinical Impressions Pt was seen for OT/PT co- evaluation to maximize pt/therapist safety this date. PTA, pt resides at [facility] since April 2025. Daughter present and provides history. Pt had been a sliding board transfer since her tib/fib fx in April and was cleared for WBAT in July 2025 when daughter reports she has been able to take a few steps with therapy and extensive assist. Pt has been mostly bed/recliner bound at facility.   Pt presents with deficits in strength, balance and activity tolerance, affecting safe and optimal ADL completion. Pt currently requires Mod A x2 for bed mobility tasks, able to initiate BLE movement, but requires verb cues for initiation of all tasks and follow commands with increased time. She tolerated sitting EOB x10 mins, however standing deferred d/t reports of 7/10 dizziness. She also reports 7/10 chest pain that has been ongoing for last several week per daughter d/t increased coughing. Able to wash her face with set up assist at EOB and Min/mod A to maintain seated balance with cues for anterior weight shift d/t posterior lean. HR ranging from 39-103 during session with BP 125/50 and 126/46. Sp02 stable throughout on RA. Pt returned to bed and able to roll with Min A for peri-care to be provided with total assist and pt propped on pillow to maximize pressure relief.  Pt would benefit from skilled OT services to address noted impairments and functional limitations to maximize safety and independence while minimizing future risk of falls,  injury, and readmission. Do anticipate the need for follow up OT services upon acute hospital DC.       If plan is discharge home, recommend the following:   A lot of help with bathing/dressing/bathroom;Two people to help with walking and/or transfers     Functional Status Assessment   Patient has had a recent decline in their functional status and demonstrates the ability to make significant improvements in function in a reasonable and predictable amount of time.     Equipment Recommendations   Other (comment) (defer to next venue)     Recommendations for Other Services         Precautions/Restrictions   Precautions Precautions: Fall Recall of Precautions/Restrictions: Intact Restrictions Weight Bearing Restrictions Per Provider Order: No (Per pt's daughter, pt had tib/fib fx earlier this year and has been WBAT since July.)     Mobility Bed Mobility Overal bed mobility: Needs Assistance Bed Mobility: Rolling, Supine to Sit, Sit to Supine Rolling: Min assist, Used rails   Supine to sit: Mod assist, +2 for physical assistance Sit to supine: Mod assist, +2 for physical assistance   General bed mobility comments: able to roll in bed utilizing bed rails and cueing with Min A; trunkal elevation and cueing for LE management to perform sup<>Sit    Transfers                   General transfer comment: deferred d/t reports of dizziness seated at EOB and needing Min/Mod A to maintain seated balance with HR up to 103 with activity      Balance Overall balance assessment: Needs assistance  Sitting-balance support: Feet supported, Bilateral upper extremity supported Sitting balance-Leahy Scale: Poor Sitting balance - Comments: Min/Mod A to maintain seated balance on high ED stretcher very brief periods of SBA <30 seconds before needing assist Postural control: Posterior lean                                 ADL either performed or assessed with  clinical judgement   ADL Overall ADL's : Needs assistance/impaired                             Toileting- Clothing Manipulation and Hygiene: Total assistance;Bed level               Vision         Perception         Praxis         Pertinent Vitals/Pain Pain Assessment Pain Assessment: 0-10 Pain Score: 7  Pain Location: chest Pain Descriptors / Indicators: Discomfort, Burning Pain Intervention(s): Limited activity within patient's tolerance, Monitored during session, Repositioned     Extremity/Trunk Assessment Upper Extremity Assessment Upper Extremity Assessment: Generalized weakness   Lower Extremity Assessment Lower Extremity Assessment: Generalized weakness   Cervical / Trunk Assessment Cervical / Trunk Assessment: Kyphotic   Communication Communication Communication: No apparent difficulties   Cognition Arousal: Alert Behavior During Therapy: WFL for tasks assessed/performed                                 Following commands: Impaired Following commands impaired: Follows one step commands with increased time     Cueing  General Comments   Cueing Techniques: Verbal cues;Tactile cues;Visual cues  increased RR with activity, HR during session ranging from 39-103 with reported dizziness while seated EOB; BP stable in supine and sitting EOB   Exercises Other Exercises Other Exercises: Edu on role of OT in acute setting.   Shoulder Instructions      Home Living Family/patient expects to be discharged to:: Skilled nursing facility                                 Additional Comments: Pt from [facility]      Prior Functioning/Environment Prior Level of Function : Needs assist;History of Falls (last six months)             Mobility Comments: Pt's daughter reports that pt has taken some steps with PT/extensive assist at [facility] and has been performing slide board transfers. ADLs  Comments: Total assistance from staff at facility for ADLs, can self feed    OT Problem List: Decreased strength;Decreased activity tolerance;Impaired balance (sitting and/or standing)   OT Treatment/Interventions: Self-care/ADL training;Therapeutic exercise;Therapeutic activities;Energy conservation;DME and/or AE instruction;Patient/family education;Balance training      OT Goals(Current goals can be found in the care plan section)   Acute Rehab OT Goals Patient Stated Goal: return to rehab OT Goal Formulation: With patient/family Time For Goal Achievement: 11/24/23 Potential to Achieve Goals: Good ADL Goals Pt Will Perform Grooming: with supervision;sitting Pt Will Perform Upper Body Bathing: with supervision;sitting Pt Will Perform Lower Body Dressing: with min assist;sitting/lateral leans;sit to/from stand   OT Frequency:  Min 2X/week    Co-evaluation PT/OT/SLP Co-Evaluation/Treatment: Yes Reason for Co-Treatment: For patient/therapist safety PT  goals addressed during session: Mobility/safety with mobility OT goals addressed during session: ADL's and self-care      AM-PAC OT 6 Clicks Daily Activity     Outcome Measure Help from another person eating meals?: A Little Help from another person taking care of personal grooming?: A Little Help from another person toileting, which includes using toliet, bedpan, or urinal?: Total Help from another person bathing (including washing, rinsing, drying)?: A Lot Help from another person to put on and taking off regular upper body clothing?: A Lot Help from another person to put on and taking off regular lower body clothing?: Total 6 Click Score: 12   End of Session Nurse Communication: Mobility status  Activity Tolerance: Patient tolerated treatment well;Patient limited by fatigue Patient left: in bed;with call bell/phone within reach;with family/visitor present  OT Visit Diagnosis: Other abnormalities of gait and mobility  (R26.89);Muscle weakness (generalized) (M62.81)                Time: 8689-8666 OT Time Calculation (min): 23 min Charges:  OT General Charges $OT Visit: 1 Visit OT Evaluation $OT Eval Moderate Complexity: 1 Mod Araya Roel, OTR/L  11/10/23, 2:30 PM  Merissa Renwick E Walter Grima 11/10/2023, 2:24 PM

## 2023-11-10 NOTE — Progress Notes (Signed)
 Progress Note   Patient: Terri Braun FMW:[id number] DOB: 12/08/1929 DOA: 11/08/2023     1 DOS: the patient was seen and examined on 11/10/2023    Brief hospital course: From HPI Terri Braun is a 89 y.o. female with medical history significant of hypertension, hyperlipidemia, hypothyroidism, GERD, CAD status post stent, depression, anxiety, alpha 1 antitrypsin, COPD, anemia presenting with cough/shortness of breath/PE.   Patient has had some cough and shortness of breath though similar to her baseline shortness of breath for the past 2 to 3 weeks.  Saw pulmonologist yesterday and had a chest x-ray and labs done.  Labs showed elevated D-dimer and patient went for VQ scan today which was positive bilaterally.  DVT study also ordered which was negative.  Patient sent to the ED for PE.   Patient denies fevers, chills, chest pain, abdominal pain, constipation, diarrhea, nausea, vomiting.   ED Course: Vital signs in the ED notable for blood pressure in the 110s-120 systolic, respirate in the 20s.    Lab workup included BMP with sodium 133, potassium 5.4, bicarb 21, glucose 205, BUN 30, creatinine stable at 1.06, calcium  8.4.   CBC with leukocytosis of 13.1, hemoglobin stable at 8.9, platelets 525.  PT, INR, PTT normal.  Troponin normal.   Chest x-ray earlier today with left pleural effusion with atelectasis.  VQ scan showing the 2 wedge-shaped perfusion deficits consistent with positive/high risk PE.  DVT study negative bilaterally.   Patient started on heparin infusion and received DuoNeb in the ED.      Assessment and Plan:    Acute pulmonary embolism > Workup outpatient showed elevated D-dimer and VQ scan was ordered which demonstrated bilateral wedge-shaped infarcts, high risk/positive for PE.  No elevated troponin so low suspicion for right heart strain but this is difficult to evaluate as patient had VQ scan performed and not CT. > Not requiring oxygen.  Echocardiogram within  normal limit of EF - Continue with heparin - Supportive care TOC working on SNF   Hyperkalemia-status post Lokelma   Hypertension - Continue home losartan    Hyperlipidemia - Continue home rosuvastatin    GERD - Continue PPI   CAD - Continue home rosuvastatin , Plavix  - Starting anticoagulation as above   Depression Anxiety - Continue home duloxetine  and trazodone   COPD Heterozygous for alpha 1 antitrypsin - Replace home Trelegy with formulary Breztri - Continue as needed albuterol    Anemia > Hemoglobin stable at 8.9 - Trend CBC   DVT prophylaxis:      Heparin Code Status:              Full Family Communication:       Updated at bedside  Disposition Plan:      Subjective:  Denies nausea vomiting chest pain or cough No acute overnight event Has been seen by PT OT with recommendation for SNF   Physical Exam:   Constitutional:      General: She is not in acute distress.    Appearance: Normal appearance.  HENT:     Head: Normocephalic and atraumatic.     Mouth/Throat:     Mouth: Mucous membranes are moist.     Pharynx: Oropharynx is clear.  Eyes:     Extraocular Movements: Extraocular movements intact.     Pupils: Pupils are equal, round, and reactive to light.  Cardiovascular:     Rate and Rhythm: Normal rate and regular rhythm.     Pulses: Normal pulses.     Heart sounds: Normal heart  sounds.  Pulmonary:     Effort: Pulmonary effort is normal. No respiratory distress.     Breath sounds: Normal breath sounds.  Abdominal:     General: Bowel sounds are normal. There is no distension.     Palpations: Abdomen is soft.     Tenderness: There is no abdominal tenderness.  Musculoskeletal:        General: No swelling or deformity.  Skin:    General: Skin is warm and dry.  Neurological:     General: No focal deficit present.     Mental Status: Mental status is at baseline.      Data reviewed:    Latest Ref Rng & Units 11/10/2023    5:38 AM 11/09/2023     2:08 AM 11/08/2023    4:33 PM  BMP  Glucose 70 - 99 mg/dL 882  884  794   BUN 8 - 23 mg/dL 27  28  30    Creatinine 0.44 - 1.00 mg/dL 9.17  9.07  8.93   Sodium 135 - 145 mmol/L 132  136  133   Potassium 3.5 - 5.1 mmol/L 4.7  5.4  5.4   Chloride 98 - 111 mmol/L 102  105  103   CO2 22 - 32 mmol/L 25  20  21    Calcium  8.9 - 10.3 mg/dL 8.5  8.1  8.4        Latest Ref Rng & Units 11/10/2023    5:38 AM 11/09/2023    2:08 AM 11/08/2023    4:33 PM  CBC  WBC 4.0 - 10.5 K/uL 9.3  8.4  13.1   Hemoglobin 12.0 - 15.0 g/dL 7.8  7.8  8.9   Hematocrit 36.0 - 46.0 % 24.0  24.3  29.3   Platelets 150 - 400 K/uL 359  379  525        Vitals:   11/10/23 0722 11/10/23 1000 11/10/23 1055 11/10/23 1416  BP: (!) 135/46 (!) 133/47 (!) 133/47 (!) 125/47  Pulse: 89 88 95 96  Resp: 20  18   Temp:   98 F (36.7 C) 99.4 F (37.4 C)  TempSrc:   Oral   SpO2:   95% 94%  Weight:      Height:        Author: Drue ONEIDA Potter, MD 11/10/2023 6:29 PM  For on call review [email or web address].

## 2023-11-10 NOTE — Evaluation (Signed)
 Physical Therapy Evaluation Patient Details Name: Terri Braun MRN: [id number] DOB: Jan 22, 1930 Today's Date: 11/10/2023  History of Present Illness  Terri Braun is a 89 y.o. female with medical history significant of hypertension, hyperlipidemia, hypothyroidism, GERD, CAD status post stent, depression, anxiety, alpha 1 antitrypsin, COPD, anemia presenting with cough/shortness of breath/PE. MD dx include acute pulmonary embolism.  Clinical Impression  Pt is pleasant 89 y.o. femalr admitted for acute PE. Pt's daughter at bedside to provide info on PLOF. Prior to hospitalization, pt was performing rehab at [facility] facility where she was able to take few steps with therapy and perform slide board transfers according to pt's daughter. Pt now requires min A to roll with verbal cuing for hand placement. Pt requires mod A +2 for supine<>sit for trunk and LE management. Unable to attempt STS transfer d/t pt c/c dizziness. Pt requires mod A to maintain seated balance d/t posterior lean. Pt with noted inc in RR and HR ranging from 30s-100s during session and no change in chest pain. Pt demonstrates deficits in balance/strength/activity tolerance. Would benefit from skilled PT to address above deficits and promote optimal return to PLOF.       If plan is discharge home, recommend the following: Two people to help with walking and/or transfers;A lot of help with bathing/dressing/bathroom;Assist for transportation   Can travel by private vehicle   No    Equipment Recommendations None recommended by PT  Recommendations for Other Services       Functional Status Assessment Patient has had a recent decline in their functional status and demonstrates the ability to make significant improvements in function in a reasonable and predictable amount of time.     Precautions / Restrictions Precautions Precautions: Fall Recall of Precautions/Restrictions: Intact Restrictions Weight Bearing  Restrictions Per Provider Order: No (Per pt's daughter, pt had tib/fib fx earlier this year and has been WBAT since July.)      Mobility  Bed Mobility Overal bed mobility: Needs Assistance Bed Mobility: Rolling, Supine to Sit, Sit to Supine Rolling: Min assist   Supine to sit: Mod assist, +2 for physical assistance Sit to supine: Mod assist, +2 for physical assistance   General bed mobility comments: Min A for rolling with verbal cues for sequencing/hand placement. Mod A +2 for trunk and LE management for supine<>sit.    Transfers Overall transfer level: Needs assistance                 General transfer comment: Unable to test d/t pt c/c dizziness. Pt with noted inc in RR while sitting at EOB. No change in chest pain.    Ambulation/Gait               General Gait Details: Unable to test d/t pt c/c dizziness. Pt with noted inc in RR while sitting at EOB. No change in chest pain.  Stairs            Wheelchair Mobility     Tilt Bed    Modified Rankin (Stroke Patients Only)       Balance Overall balance assessment: Needs assistance Sitting-balance support: Feet supported, Bilateral upper extremity supported Sitting balance-Leahy Scale: Poor Sitting balance - Comments: Pt able to maintain seated balance with min-mod A and short instances of SBA. Pt fatigues quickly, however is able to correct sitting balance with assistance. Pt demosntrates posterior lean. Able to perform reaching exercises with SUE. Postural control: Posterior lean     Standing balance comment: Unable to test  d/t pt c/c dizziness. Pt with noted inc in RR while sitting at EOB. No change in chest pain.                             Pertinent Vitals/Pain Pain Assessment Pain Assessment: 0-10 Pain Score: 7  Pain Location: chest Pain Descriptors / Indicators: Discomfort, Burning Pain Intervention(s): Limited activity within patient's tolerance, Monitored during session     Home Living Family/patient expects to be discharged to:: Skilled nursing facility                   Additional Comments: Pt from [facility]    Prior Function Prior Level of Function : Needs assist;History of Falls (last six months)             Mobility Comments: Pt's daughter reports that pt has taken some steps with PT at [facility] and has been performing slide board transfers. ADLs Comments: Total assistance from staff at facility     Extremity/Trunk Assessment   Upper Extremity Assessment Upper Extremity Assessment: Generalized weakness    Lower Extremity Assessment Lower Extremity Assessment: Generalized weakness    Cervical / Trunk Assessment Cervical / Trunk Assessment: Kyphotic  Communication   Communication Communication: No apparent difficulties    Cognition Arousal: Alert Behavior During Therapy: WFL for tasks assessed/performed   PT - Cognitive impairments: No apparent impairments                       PT - Cognition Comments: No apparent impairments, however daughter at bedside to provide info on PLOF. Following commands: Impaired Following commands impaired: Follows one step commands with increased time     Cueing Cueing Techniques: Verbal cues, Tactile cues, Visual cues     General Comments General comments (skin integrity, edema, etc.): Noted inc in RR while sitting at EOB. Pt with HR ranging from 30s-100s. Pt reports dizziness while sitting at EOB and no change in chest pain. BP WNL for therapy, however diastolic number low. Pt able to perform SLR in bed.    Exercises Other Exercises Other Exercises: total A for peri care   Assessment/Plan    PT Assessment Patient needs continued PT services  PT Problem List Decreased strength;Decreased activity tolerance;Decreased balance;Decreased mobility;Decreased knowledge of use of DME       PT Treatment Interventions DME instruction;Gait training;Functional mobility  training;Therapeutic activities;Therapeutic exercise;Balance training;Patient/family education;Neuromuscular re-education    PT Goals (Current goals can be found in the Care Plan section)  Acute Rehab PT Goals Patient Stated Goal: get stronger PT Goal Formulation: With patient Time For Goal Achievement: 11/24/23 Potential to Achieve Goals: Fair    Frequency Min 2X/week     Co-evaluation PT/OT/SLP Co-Evaluation/Treatment: Yes Reason for Co-Treatment: For patient/therapist safety PT goals addressed during session: Mobility/safety with mobility OT goals addressed during session: ADL's and self-care       AM-PAC PT 6 Clicks Mobility  Outcome Measure Help needed turning from your back to your side while in a flat bed without using bedrails?: A Little Help needed moving from lying on your back to sitting on the side of a flat bed without using bedrails?: A Little Help needed moving to and from a bed to a chair (including a wheelchair)?: A Lot Help needed standing up from a chair using your arms (e.g., wheelchair or bedside chair)?: A Lot Help needed to walk in hospital room?: A Lot Help  needed climbing 3-5 steps with a railing? : Total 6 Click Score: 13    End of Session   Activity Tolerance: Patient tolerated treatment well Patient left: in bed;with call bell/phone within reach;with family/visitor present Nurse Communication: Mobility status PT Visit Diagnosis: Other abnormalities of gait and mobility (R26.89);Muscle weakness (generalized) (M62.81);Difficulty in walking, not elsewhere classified (R26.2)    Time: 8689-8665 PT Time Calculation (min) (ACUTE ONLY): 24 min   Charges:                 Brody Bonneau, SPT   Kalib Bhagat 11/10/2023, 2:01 PM

## 2023-11-10 NOTE — Progress Notes (Signed)
 PHARMACY - ANTICOAGULATION CONSULT NOTE  Pharmacy Consult for Heparin Infusion  Indication: pulmonary embolus  Allergies  Allergen Reactions   Shellfish Allergy Anaphylaxis    Ended up in the ED after eating shellfish, caused diarrhea and severe GI upset , unsure if caused SOB   Shellfish Protein-Containing Drug Products     Other reaction(s): Not available   Sulfa Antibiotics     Patient Measurements: Height: 5' 5 (165.1 cm) Weight: 64.4 kg (141 lb 15.6 oz) IBW/kg (Calculated) : 57 HEPARIN DW (KG): 64.4  Vital Signs: Temp: 98.6 F (37 C) (10/10 0537) Temp Source: Oral (10/10 0537) BP: 100/54 (10/10 0537) Pulse Rate: 89 (10/10 0537)  Labs: Recent Labs    11/08/23 1633 11/08/23 1633 11/08/23 1851 11/09/23 0208 11/09/23 1123 11/09/23 2016 11/10/23 0538  HGB 8.9*  --   --  7.8*  --   --  7.8*  HCT 29.3*  --   --  24.3*  --   --  24.0*  PLT 525*  --   --  379  --   --  359  APTT 29  --   --   --   --   --   --   LABPROT 14.0  --   --   --   --   --   --   INR 1.0  --   --   --   --   --   --   HEPARINUNFRC  --    < >  --  0.25* 0.25* 0.44 0.40  CREATININE 1.06*  --   --  0.92  --   --  0.82  TROPONINIHS 8  --  8  --   --   --   --    < > = values in this interval not displayed.    Estimated Creatinine Clearance: 39.4 mL/min (by C-G formula based on SCr of 0.82 mg/dL).   Medical History: Past Medical History:  Diagnosis Date   Anemia    ASCVD (arteriosclerotic cardiovascular disease)    COPD (chronic obstructive pulmonary disease) (HCC)    Diverticulosis    GERD (gastroesophageal reflux disease)    Hypertension    Hypothyroidism    Tibial fracture 05/03/2023     Assessment: 89 yo female with PMH of HTN, HLD, Hypothyroidism, GERD, CAD, Depression, COPD, and Anemia. Outpatient VQ scan positive for multiple pulmonary emboli. Pharmacy consulted for heparin infusion dosing and monitoring.   Goal of Therapy:  Heparin level 0.3-0.7 units/ml Monitor  platelets by anticoagulation protocol: Yes  10/9: HL @ 0208 = 0.25, SUBtherapeutic 10/9: HL @ 1123 = 0.25, SUBtherapeutic at 1150 un/hr 10/10: HL @ 0538 = 0.40, therapeutic X 2    Plan:  10/10:  HL @ 0538 = 0.40, therapeutic X 2  - will continue heparin infusion at 1300 units/hr - recheck heparin on 10/11 with AM labs - CBC daily   Anijah Spohr D Clinical Pharmacist 11/10/2023 6:58 AM

## 2023-11-10 NOTE — ED Notes (Signed)
 This RN and Ashanti NT in room. Pt noted to be soiled in urine. Bed linens also noted to be soiled. Pt unable to tell this RN or NT when she needs to use the bathroom or if she feels like she is wet. Pt cleaned with bath wipes. Pt bed linens removed and bed cleaned. New linens placed underneath pt. Pt placed on draw sheet, chux pad, and new brief. Pt also noted to have redness to her sacral area. This RN applied sacral heart pad to area. Pt placed in hospital gown and clothing placed in belongings bag. Pt rolled to her right side with pillow underneath her left hip. Pt reports she would like to have a breathing treatment. Pt also given coca cola at this time. Denies any further needs.

## 2023-11-11 DIAGNOSIS — I2699 Other pulmonary embolism without acute cor pulmonale: Secondary | ICD-10-CM | POA: Diagnosis not present

## 2023-11-11 LAB — BASIC METABOLIC PANEL WITH GFR
Anion gap: 6 (ref 5–15)
BUN: 25 mg/dL — ABNORMAL HIGH (ref 8–23)
CO2: 27 mmol/L (ref 22–32)
Calcium: 8.7 mg/dL — ABNORMAL LOW (ref 8.9–10.3)
Chloride: 99 mmol/L (ref 98–111)
Creatinine, Ser: 0.79 mg/dL (ref 0.44–1.00)
GFR, Estimated: >60 mL/min (ref >60)
Glucose, Bld: 102 mg/dL — ABNORMAL HIGH (ref 70–99)
Potassium: 4.6 mmol/L (ref 3.5–5.1)
Sodium: 132 mmol/L — ABNORMAL LOW (ref 135–145)

## 2023-11-11 LAB — CBC WITH DIFFERENTIAL/PLATELET
Abs Immature Granulocytes: 0.16 K/uL — ABNORMAL HIGH (ref 0.00–0.07)
Basophils Absolute: 0 K/uL (ref 0.0–0.1)
Basophils Relative: 0 %
Eosinophils Absolute: 0.1 K/uL (ref 0.0–0.5)
Eosinophils Relative: 1 %
HCT: 27.4 % — ABNORMAL LOW (ref 36.0–46.0)
Hemoglobin: 8.7 g/dL — ABNORMAL LOW (ref 12.0–15.0)
Immature Granulocytes: 2 %
Lymphocytes Relative: 8 %
Lymphs Abs: 0.8 K/uL (ref 0.7–4.0)
MCH: 28.2 pg (ref 26.0–34.0)
MCHC: 31.8 g/dL (ref 30.0–36.0)
MCV: 89 fL (ref 80.0–100.0)
Monocytes Absolute: 0.9 K/uL (ref 0.1–1.0)
Monocytes Relative: 9 %
Neutro Abs: 8 K/uL — ABNORMAL HIGH (ref 1.7–7.7)
Neutrophils Relative %: 80 %
Platelets: 374 K/uL (ref 150–400)
RBC: 3.08 MIL/uL — ABNORMAL LOW (ref 3.87–5.11)
RDW: 16.7 % — ABNORMAL HIGH (ref 11.5–15.5)
WBC: 10 K/uL (ref 4.0–10.5)
nRBC: 0 % (ref 0.0–0.2)

## 2023-11-11 LAB — HEPARIN LEVEL (UNFRACTIONATED): Heparin Unfractionated: 0.44 [IU]/mL (ref 0.30–0.70)

## 2023-11-11 MED ORDER — INFLUENZA VAC SPLIT HIGH-DOSE 0.5 ML IM SUSY
0.5000 mL | PREFILLED_SYRINGE | INTRAMUSCULAR | Status: DC
  Filled 2023-11-11: qty 0.5

## 2023-11-11 MED ORDER — APIXABAN 5 MG PO TABS
5.0000 mg | ORAL_TABLET | Freq: Two times a day (BID) | ORAL | Status: DC
  Administered 2023-11-18 – 2023-11-25 (×15): 5 mg via ORAL
  Filled 2023-11-11 (×15): qty 1

## 2023-11-11 MED ORDER — HEPARIN (PORCINE) 25000 UT/250ML-% IV SOLN: 1300.0000 [IU]/h | INTRAVENOUS | Status: AC

## 2023-11-11 MED ORDER — APIXABAN 5 MG PO TABS
10.0000 mg | ORAL_TABLET | Freq: Two times a day (BID) | ORAL | Status: AC
  Administered 2023-11-11 – 2023-11-17 (×14): 10 mg via ORAL
  Filled 2023-11-11 (×14): qty 2

## 2023-11-11 MED ORDER — DM-GUAIFENESIN ER 30-600 MG PO TB12
1.0000 | ORAL_TABLET | Freq: Two times a day (BID) | ORAL | Status: DC
Start: 2023-11-11 — End: 2023-11-21
  Administered 2023-11-11 – 2023-11-19 (×17): 1 via ORAL
  Filled 2023-11-11 (×20): qty 1

## 2023-11-11 NOTE — Plan of Care (Signed)

## 2023-11-11 NOTE — TOC Progression Note (Signed)
 Transition of Care [facility]) - Progression Note    Patient Details  Name: Terri Braun MRN: [id number] Date of Birth: May 19, 1929  Transition of Care [facility]) CM/SW Contact  [place] L Kalyan Barabas, [place] Phone Number: 11/11/2023, 4:15 PM  Clinical Narrative:     Bed offers have been e-mailed to patients daughter, [name], to review.                     Expected Discharge Plan and Services                                               Social Drivers of Health (SDOH) Interventions SDOH Screenings   Food Insecurity: No Food Insecurity (11/10/2023)  Housing: Low Risk  (11/10/2023)  Transportation Needs: No Transportation Needs (11/10/2023)  Utilities: Not At Risk (11/10/2023)  Alcohol Screen: Low Risk  (09/29/2021)  Depression (PHQ2-9): Low Risk  (09/29/2021)  Financial Resource Strain: Low Risk  (08/11/2023)   Received from [facility]  Physical Activity: Insufficiently Active (09/29/2021)  Social Connections: Moderately Isolated (11/10/2023)  Stress: No Stress Concern Present (09/29/2021)  Tobacco Use: Low Risk  (11/08/2023)    Readmission Risk Interventions     No data to display

## 2023-11-11 NOTE — Progress Notes (Addendum)
 PHARMACY - ANTICOAGULATION CONSULT NOTE  Pharmacy Consult for Heparin Infusion  Indication: pulmonary embolus  Allergies  Allergen Reactions   Shellfish Allergy Anaphylaxis    Ended up in the ED after eating shellfish, caused diarrhea and severe GI upset , unsure if caused SOB   Shellfish Protein-Containing Drug Products     Other reaction(s): Not available   Sulfa Antibiotics     Patient Measurements: Height: 5' 5 (165.1 cm) Weight: 64.4 kg (141 lb 15.6 oz) IBW/kg (Calculated) : 57 HEPARIN DW (KG): 64.4  Vital Signs: Temp: 98.3 F (36.8 C) (10/11 0441) Temp Source: Oral (10/11 0441) BP: 157/56 (10/11 0441) Pulse Rate: 94 (10/11 0441)  Labs: Recent Labs    11/08/23 1633 11/08/23 1851 11/09/23 0208 11/09/23 1123 11/09/23 2016 11/10/23 0538 11/11/23 0449  HGB 8.9*  --  7.8*  --   --  7.8* 8.7*  HCT 29.3*  --  24.3*  --   --  24.0* 27.4*  PLT 525*  --  379  --   --  359 374  APTT 29  --   --   --   --   --   --   LABPROT 14.0  --   --   --   --   --   --   INR 1.0  --   --   --   --   --   --   HEPARINUNFRC  --   --  0.25*   < > 0.44 0.40 0.44  CREATININE 1.06*  --  0.92  --   --  0.82 0.79  TROPONINIHS 8 8  --   --   --   --   --    < > = values in this interval not displayed.    Estimated Creatinine Clearance: 40.4 mL/min (by C-G formula based on SCr of 0.79 mg/dL).   Medical History: Past Medical History:  Diagnosis Date   Anemia    ASCVD (arteriosclerotic cardiovascular disease)    COPD (chronic obstructive pulmonary disease) (HCC)    Diverticulosis    GERD (gastroesophageal reflux disease)    Hypertension    Hypothyroidism    Tibial fracture 05/03/2023     Assessment: 89 yo female with PMH of HTN, HLD, Hypothyroidism, GERD, CAD, Depression, COPD, and Anemia. Outpatient VQ scan positive for multiple pulmonary emboli. Pharmacy consulted for heparin infusion dosing and monitoring.   Goal of Therapy:  Heparin level 0.3-0.7 units/ml Monitor  platelets by anticoagulation protocol: Yes  10/9: HL @ 0208 = 0.25, SUBtherapeutic 10/9: HL @ 1123 = 0.25, SUBtherapeutic at 1150 un/hr 10/10: HL @ 0538 = 0.40, therapeutic X 2  10/11: HL @ 0449 = 0.44, therapeutic X 3    Plan:  10/11:  HL @ 0449 = 0.44, therapeutic X 3  - will transition pt to Eliquis per MD request - Eliquis 10 mg PO BID X 7 days to start on 10/11 @ 1000 followed by 5 mg PO BID  - Heparin to d/c @ 1000   Ariz Terrones D Clinical Pharmacist 11/11/2023 6:17 AM

## 2023-11-11 NOTE — Progress Notes (Signed)
 Progress Note   Patient: Terri Braun FMW:[id number] DOB: [birth year] DOA: 11/08/2023     2 DOS: the patient was seen and examined on 11/11/2023     Brief hospital course: From HPI Terri Braun is a 89 y.o. female with medical history significant of hypertension, hyperlipidemia, hypothyroidism, GERD, CAD status post stent, depression, anxiety, alpha 1 antitrypsin, COPD, anemia presenting with cough/shortness of breath/PE.   Patient has had some cough and shortness of breath though similar to her baseline shortness of breath for the past 2 to 3 weeks.  Saw pulmonologist yesterday and had a chest x-ray and labs done.  Labs showed elevated D-dimer and patient went for VQ scan today which was positive bilaterally.  DVT study also ordered which was negative.  Patient sent to the ED for PE.   Patient denies fevers, chills, chest pain, abdominal pain, constipation, diarrhea, nausea, vomiting.   ED Course: Vital signs in the ED notable for blood pressure in the 110s-120 systolic, respirate in the 20s.    Lab workup included BMP with sodium 133, potassium 5.4, bicarb 21, glucose 205, BUN 30, creatinine stable at 1.06, calcium  8.4.   CBC with leukocytosis of 13.1, hemoglobin stable at 8.9, platelets 525.  PT, INR, PTT normal.  Troponin normal.   Chest x-ray earlier today with left pleural effusion with atelectasis.  VQ scan showing the 2 wedge-shaped perfusion deficits consistent with positive/high risk PE.  DVT study negative bilaterally.   Patient started on heparin infusion and received DuoNeb in the ED.      Assessment and Plan:    Acute pulmonary embolism > Workup outpatient showed elevated D-dimer and VQ scan was ordered which demonstrated bilateral wedge-shaped infarcts, high risk/positive for PE.  No elevated troponin so low suspicion for right heart strain but this is difficult to evaluate as patient had VQ scan performed and not CT. > Not requiring oxygen.  Echocardiogram within  normal limit of EF Heparin drip transition to Eliquis today - Supportive care TOC working on SNF   Hyperkalemia-status post [employer]   Hypertension - Continue home losartan    Hyperlipidemia - Continue home rosuvastatin    GERD - Continue PPI   CAD - Continue home rosuvastatin , Plavix  - Starting anticoagulation as above   Depression Anxiety - Continue home duloxetine  and trazodone   COPD Heterozygous for alpha 1 antitrypsin - Replace home Trelegy with formulary Breztri - Continue as needed albuterol    Anemia > Hemoglobin stable at 8.9 - Trend CBC   DVT prophylaxis:      Heparin Code Status:              Full Family Communication:       Updated at bedside  Disposition Plan:      Subjective:  Denies nausea vomiting chest pain or cough Denied acute complaint Has been seen by PT OT with recommendation for SNF   Physical Exam:   Constitutional:      General: She is not in acute distress.    Appearance: Normal appearance.  HENT:     Head: Normocephalic and atraumatic.     Mouth/Throat:     Mouth: Mucous membranes are moist.     Pharynx: Oropharynx is clear.  Eyes:     Extraocular Movements: Extraocular movements intact.     Pupils: Pupils are equal, round, and reactive to light.  Cardiovascular:     Rate and Rhythm: Normal rate and regular rhythm.     Pulses: Normal pulses.     Heart sounds:  Normal heart sounds.  Pulmonary:     Effort: Pulmonary effort is normal. No respiratory distress.     Breath sounds: Normal breath sounds.  Abdominal:     General: Bowel sounds are normal. There is no distension.     Palpations: Abdomen is soft.     Tenderness: There is no abdominal tenderness.  Musculoskeletal:        General: No swelling or deformity.  Skin:    General: Skin is warm and dry.  Neurological:     General: No focal deficit present.     Mental Status: Mental status is at baseline.       Data reviewed:    Latest Ref Rng & Units 11/11/2023     4:49 AM 11/10/2023    5:38 AM 11/09/2023    2:08 AM  CBC  WBC 4.0 - 10.5 K/uL 10.0  9.3  8.4   Hemoglobin 12.0 - 15.0 g/dL 8.7  7.8  7.8   Hematocrit 36.0 - 46.0 % 27.4  24.0  24.3   Platelets 150 - 400 K/uL 374  359  379        Latest Ref Rng & Units 11/11/2023    4:49 AM 11/10/2023    5:38 AM 11/09/2023    2:08 AM  BMP  Glucose 70 - 99 mg/dL 897  882  884   BUN 8 - 23 mg/dL 25  27  28    Creatinine 0.44 - 1.00 mg/dL 9.20  9.17  9.07   Sodium 135 - 145 mmol/L 132  132  136   Potassium 3.5 - 5.1 mmol/L 4.6  4.7  5.4   Chloride 98 - 111 mmol/L 99  102  105   CO2 22 - 32 mmol/L 27  25  20    Calcium  8.9 - 10.3 mg/dL 8.7  8.5  8.1       Vitals:   11/10/23 2338 11/11/23 0441 11/11/23 0824 11/11/23 1138  BP: (!) 142/53 (!) 157/56 (!) 153/51 (!) 118/41  Pulse: 94 94 94 82  Resp: 18 18 18 20   Temp: 98.4 F (36.9 C) 98.3 F (36.8 C) 99.5 F (37.5 C) 99.1 F (37.3 C)  TempSrc:  Oral    SpO2: 93% 95% 92% 93%  Weight:      Height:         Author: Drue ONEIDA Potter, MD 11/11/2023 2:18 PM  For on call review [email or web address].

## 2023-11-12 ENCOUNTER — Inpatient Hospital Stay

## 2023-11-12 DIAGNOSIS — I2699 Other pulmonary embolism without acute cor pulmonale: Secondary | ICD-10-CM | POA: Diagnosis not present

## 2023-11-12 LAB — BASIC METABOLIC PANEL WITH GFR
Anion gap: 7 (ref 5–15)
BUN: 27 mg/dL — ABNORMAL HIGH (ref 8–23)
CO2: 24 mmol/L (ref 22–32)
Calcium: 8.3 mg/dL — ABNORMAL LOW (ref 8.9–10.3)
Chloride: 100 mmol/L (ref 98–111)
Creatinine, Ser: 0.84 mg/dL (ref 0.44–1.00)
GFR, Estimated: >60 mL/min (ref >60)
Glucose, Bld: 106 mg/dL — ABNORMAL HIGH (ref 70–99)
Potassium: 4.4 mmol/L (ref 3.5–5.1)
Sodium: 131 mmol/L — ABNORMAL LOW (ref 135–145)

## 2023-11-12 LAB — CBC WITH DIFFERENTIAL/PLATELET
Abs Immature Granulocytes: 0.11 K/uL — ABNORMAL HIGH (ref 0.00–0.07)
Basophils Absolute: 0 K/uL (ref 0.0–0.1)
Basophils Relative: 0 %
Eosinophils Absolute: 0.1 K/uL (ref 0.0–0.5)
Eosinophils Relative: 1 %
HCT: 23.4 % — ABNORMAL LOW (ref 36.0–46.0)
Hemoglobin: 7.5 g/dL — ABNORMAL LOW (ref 12.0–15.0)
Immature Granulocytes: 1 %
Lymphocytes Relative: 8 %
Lymphs Abs: 0.9 K/uL (ref 0.7–4.0)
MCH: 28.3 pg (ref 26.0–34.0)
MCHC: 32.1 g/dL (ref 30.0–36.0)
MCV: 88.3 fL (ref 80.0–100.0)
Monocytes Absolute: 1.3 K/uL — ABNORMAL HIGH (ref 0.1–1.0)
Monocytes Relative: 11 %
Neutro Abs: 9 K/uL — ABNORMAL HIGH (ref 1.7–7.7)
Neutrophils Relative %: 79 %
Platelets: 328 K/uL (ref 150–400)
RBC: 2.65 MIL/uL — ABNORMAL LOW (ref 3.87–5.11)
RDW: 16.9 % — ABNORMAL HIGH (ref 11.5–15.5)
WBC: 11.4 K/uL — ABNORMAL HIGH (ref 4.0–10.5)
nRBC: 0 % (ref 0.0–0.2)

## 2023-11-12 MED ORDER — CEFTRIAXONE SODIUM 1 G IJ SOLR
1.0000 g | INTRAMUSCULAR | Status: DC
  Administered 2023-11-12 – 2023-11-15 (×4): 1 g via INTRAVENOUS
  Filled 2023-11-12 (×4): qty 10

## 2023-11-12 MED ORDER — SODIUM CHLORIDE 0.9 % IV SOLN
500.0000 mg | INTRAVENOUS | Status: DC
  Administered 2023-11-12 – 2023-11-15 (×4): 500 mg via INTRAVENOUS
  Filled 2023-11-12 (×5): qty 5

## 2023-11-12 NOTE — Plan of Care (Signed)

## 2023-11-12 NOTE — Progress Notes (Signed)
 Progress Note   Patient: Terri Braun FMW:[id number] DOB: 1929-12-11 DOA: 11/08/2023     3 DOS: the patient was seen and examined on 11/12/2023     Brief hospital course: From HPI Terri Braun is a 89 y.o. female with medical history significant of hypertension, hyperlipidemia, hypothyroidism, GERD, CAD status post stent, depression, anxiety, alpha 1 antitrypsin, COPD, anemia presenting with cough/shortness of breath/PE.   Patient has had some cough and shortness of breath though similar to her baseline shortness of breath for the past 2 to 3 weeks.  Saw pulmonologist yesterday and had a chest x-ray and labs done.  Labs showed elevated D-dimer and patient went for VQ scan today which was positive bilaterally.  DVT study also ordered which was negative.  Patient sent to the ED for PE.   Patient denies fevers, chills, chest pain, abdominal pain, constipation, diarrhea, nausea, vomiting.   ED Course: Vital signs in the ED notable for blood pressure in the 110s-120 systolic, respirate in the 20s.    Lab workup included BMP with sodium 133, potassium 5.4, bicarb 21, glucose 205, BUN 30, creatinine stable at 1.06, calcium  8.4.   CBC with leukocytosis of 13.1, hemoglobin stable at 8.9, platelets 525.  PT, INR, PTT normal.  Troponin normal.   Chest x-ray earlier today with left pleural effusion with atelectasis.  VQ scan showing the 2 wedge-shaped perfusion deficits consistent with positive/high risk PE.  DVT study negative bilaterally.   Patient started on heparin infusion and received DuoNeb in the ED.      Assessment and Plan:    Acute pulmonary embolism > Workup outpatient showed elevated D-dimer and VQ scan was ordered which demonstrated bilateral wedge-shaped infarcts, high risk/positive for PE.  No elevated troponin so low suspicion for right heart strain but this is difficult to evaluate as patient had VQ scan performed and not CT. > Not requiring oxygen.  Echocardiogram within  normal limit of EF Heparin drip transition to Eliquis today - Supportive care TOC working on SNF    Community-acquired pneumonia Patient requiring 3 L of oxygen Chest x-ray showing findings of pneumonia Initiated on ceftriaxone  and azithromycin  Hyperkalemia-status post [employer]   Hypertension - Continue home losartan    Hyperlipidemia - Continue home rosuvastatin    GERD - Continue PPI   CAD - Continue home rosuvastatin , Plavix  - Starting anticoagulation as above   Depression Anxiety - Continue home duloxetine  and trazodone   COPD Heterozygous for alpha 1 antitrypsin - Replace home Trelegy with formulary Breztri - Continue as needed albuterol    Anemia > Hemoglobin stable at 8.9 - Trend CBC   DVT prophylaxis:      Heparin Code Status:              Full Family Communication:       Updated at bedside  Disposition Plan:      Subjective:  Denies nausea vomiting chest pain or cough Oxygen requirement worsened today and chest x-ray showed pneumonia Patient initiated on oxygen as well as antibiotics PT OT recommends skilled nursing facility when medically stable   Physical Exam:   Constitutional:      General: She is not in acute distress.    Appearance: Normal appearance.  HENT:     Head: Normocephalic and atraumatic.     Mouth/Throat:     Mouth: Mucous membranes are moist.     Pharynx: Oropharynx is clear.  Eyes:     Extraocular Movements: Extraocular movements intact.     Pupils: Pupils  are equal, round, and reactive to light.  Cardiovascular:     Rate and Rhythm: Normal rate and regular rhythm.     Pulses: Normal pulses.     Heart sounds: Normal heart sounds.  Pulmonary:     Effort: Pulmonary effort is normal. No respiratory distress.     Breath sounds: Normal breath sounds.  Abdominal:     General: Bowel sounds are normal. There is no distension.     Palpations: Abdomen is soft.     Tenderness: There is no abdominal tenderness.  Musculoskeletal:         General: No swelling or deformity.  Skin:    General: Skin is warm and dry.  Neurological:     General: No focal deficit present.     Mental Status: Mental status is at baseline.       Data reviewed: Vitals:   11/12/23 0851 11/12/23 1157 11/12/23 1337 11/12/23 1607  BP: (!) 171/62 (!) 103/43 (!) 114/49 (!) 111/41  Pulse: (!) 110 (!) 104 (!) 102 97  Resp: 20 20  20   Temp: 100.3 F (37.9 C)  98 F (36.7 C) 98.8 F (37.1 C)  TempSrc: Oral   Oral  SpO2: 91% 93% 95% 96%  Weight:      Height:          Latest Ref Rng & Units 11/12/2023    2:11 AM 11/11/2023    4:49 AM 11/10/2023    5:38 AM  CBC  WBC 4.0 - 10.5 K/uL 11.4  10.0  9.3   Hemoglobin 12.0 - 15.0 g/dL 7.5  8.7  7.8   Hematocrit 36.0 - 46.0 % 23.4  27.4  24.0   Platelets 150 - 400 K/uL 328  374  359        Latest Ref Rng & Units 11/12/2023    2:11 AM 11/11/2023    4:49 AM 11/10/2023    5:38 AM  BMP  Glucose 70 - 99 mg/dL 893  897  882   BUN 8 - 23 mg/dL 27  25  27    Creatinine 0.44 - 1.00 mg/dL 9.15  9.20  9.17   Sodium 135 - 145 mmol/L 131  132  132   Potassium 3.5 - 5.1 mmol/L 4.4  4.6  4.7   Chloride 98 - 111 mmol/L 100  99  102   CO2 22 - 32 mmol/L 24  27  25    Calcium  8.9 - 10.3 mg/dL 8.3  8.7  8.5      Author: Drue ONEIDA Potter, MD 11/12/2023 4:47 PM  For on call review [email or web address].

## 2023-11-13 ENCOUNTER — Other Ambulatory Visit (HOSPITAL_COMMUNITY): Payer: Self-pay

## 2023-11-13 ENCOUNTER — Telehealth (HOSPITAL_COMMUNITY): Payer: Self-pay | Admitting: Pharmacy Technician

## 2023-11-13 DIAGNOSIS — J189 Pneumonia, unspecified organism: Secondary | ICD-10-CM | POA: Diagnosis not present

## 2023-11-13 MED ORDER — SODIUM CHLORIDE 0.9 % IV BOLUS
500.0000 mL | Freq: Once | INTRAVENOUS | Status: AC
  Administered 2023-11-13: 500 mL via INTRAVENOUS

## 2023-11-13 NOTE — Plan of Care (Signed)

## 2023-11-13 NOTE — Telephone Encounter (Signed)
 Patient Product/process development scientist completed.    The patient is insured through [employer]. Patient has Medicare and is not eligible for a copay card, but may be able to apply for patient assistance or Medicare RX Payment Plan (Patient Must reach out to their plan, if eligible for payment plan), if available.    Ran test claim for Eliquis 5 mg and the current 30 day co-pay is $0.00.   This test claim was processed through [facility]- copay amounts may vary at other pharmacies due to pharmacy/plan contracts, or as the patient moves through the different stages of their insurance plan.     Reyes Sharps, CPHT Pharmacy Technician Patient Advocate Specialist Lead [facility] Patient Advocate Team Direct Number: [phone]  Fax: [phone]

## 2023-11-13 NOTE — Progress Notes (Signed)
 Physical Therapy Treatment Patient Details Name: Terri Braun MRN: [id number] DOB: 21-May-1929 Today's Date: 11/13/2023   History of Present Illness Pt is a 89 y.o. female with medical history significant of hypertension, hyperlipidemia, hypothyroidism, GERD, CAD status post stent, depression, anxiety, alpha 1 antitrypsin, COPD, anemia presenting with cough/shortness of breath and found to have acute PE.    PT Comments  Pt received saturated in urine, soiled purewick removed. Co-tx session with OT for safe mobility and functional progression requiring Mod/MaxA of 2 for supine<>sit and rolling. Dependent for bedside hygiene assist prior to sitting EOB for 5+ minutes with Mod/Min of 1 to maintain. SpO2 93% or greater while on 2L, no SOB or dizziness noted. Pt placed on Left side upon returning to bed to prevent skin breakdown. All needs in reach. Daughter present for session.   If plan is discharge home, recommend the following: Two people to help with walking and/or transfers;A lot of help with bathing/dressing/bathroom;Assist for transportation   Can travel by private vehicle     No  Equipment Recommendations  None recommended by PT (TBD at next level of care)    Recommendations for Other Services       Precautions / Restrictions Precautions Precautions: Fall Recall of Precautions/Restrictions: Intact Required Braces or Orthoses:  (B float boots due to heel breakdown) Restrictions Weight Bearing Restrictions Per Provider Order: No     Mobility  Bed Mobility Overal bed mobility: Needs Assistance Bed Mobility: Rolling, Supine to Sit, Sit to Supine Rolling: Mod assist, Used rails   Supine to sit: Mod assist, +2 for physical assistance Sit to supine: +2 for physical assistance, Max assist   General bed mobility comments:  (Increased physical assist for bed mobility)    Transfers                   General transfer comment:  (Unable to tolerate standing)     Ambulation/Gait               General Gait Details:  (Has not ambulated in a significant amount of time)   Stairs             Wheelchair Mobility     Tilt Bed    Modified Rankin (Stroke Patients Only)       Balance Overall balance assessment: Needs assistance Sitting-balance support: Feet supported, Bilateral upper extremity supported Sitting balance-Leahy Scale: Poor Sitting balance - Comments:  (Mod/MinA to sit EOB for 5+ minutes) Postural control: Posterior lean   Standing balance-Leahy Scale: Zero                              Communication Communication Communication: Impaired Factors Affecting Communication: Hearing impaired  Cognition Arousal: Alert Behavior During Therapy: WFL for tasks assessed/performed   PT - Cognitive impairments: No family/caregiver present to determine baseline, Orientation, Awareness, Memory, Initiation, Problem solving   Orientation impairments: Situation, Place, Time                   PT - Cognition Comments:  (Daughter states cognition has worsened since hospital admission) Following commands: Impaired Following commands impaired: Follows one step commands with increased time    Cueing Cueing Techniques: Verbal cues, Tactile cues, Visual cues  Exercises General Exercises - Lower Extremity Ankle Circles/Pumps: PROM, Both, 10 reps, Supine Heel Slides: AAROM, Both, 5 reps Other Exercises Other Exercises:  (Pt and daughter educated on role of acute PT, recs for  STR, and goals for co-tx session)    General Comments General comments (skin integrity, edema, etc.):  (Pt found saturated in large amount of new and old urine, buttocks red and at risk for skin breakdown, Mepilex removed. Bilateral heel breakdown with mepilex dressings intact. 2L O2 SpO2 above 93% throughout session.)      Pertinent Vitals/Pain Pain Assessment Pain Assessment: No/denies pain    Home Living                           Prior Function            PT Goals (current goals can now be found in the care plan section) Acute Rehab PT Goals Patient Stated Goal: get stronger    Frequency    Min 2X/week      PT Plan      Co-evaluation PT/OT/SLP Co-Evaluation/Treatment: Yes Reason for Co-Treatment: For patient/therapist safety PT goals addressed during session: Mobility/safety with mobility OT goals addressed during session: ADL's and self-care      AM-PAC PT 6 Clicks Mobility   Outcome Measure  Help needed turning from your back to your side while in a flat bed without using bedrails?: Total Help needed moving from lying on your back to sitting on the side of a flat bed without using bedrails?: Total Help needed moving to and from a bed to a chair (including a wheelchair)?: Total Help needed standing up from a chair using your arms (e.g., wheelchair or bedside chair)?: Total Help needed to walk in hospital room?: Total Help needed climbing 3-5 steps with a railing? : Total 6 Click Score: 6    End of Session Equipment Utilized During Treatment: Oxygen (2L O2) Activity Tolerance: Patient tolerated treatment well Patient left: in bed;with call bell/phone within reach;with family/visitor present Nurse Communication: Mobility status;Other (comment) (Incontinence, skin breakdown concerns) PT Visit Diagnosis: Other abnormalities of gait and mobility (R26.89);Muscle weakness (generalized) (M62.81);Difficulty in walking, not elsewhere classified (R26.2)     Time: 8849-8764 PT Time Calculation (min) (ACUTE ONLY): 45 min  Charges:    $Therapeutic Activity: 23-37 mins PT General Charges $$ ACUTE PT VISIT: 1 Visit                    Darice Bohr, PTA  Darice JAYSON Bohr 11/13/2023, 1:33 PM

## 2023-11-13 NOTE — Care Management Important Message (Signed)
 Important Message  Patient Details  Name: Terri Braun MRN: [id number] Date of Birth: [birth year]   Important Message Given:  Yes - Medicare IM     Terri Braun 11/13/2023, 3:46 PM
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
## 2023-11-13 NOTE — Progress Notes (Signed)
 Occupational Therapy Treatment Patient Details Name: Terri Braun MRN: [id number] DOB: 09-24-1929 Today's Date: 11/13/2023   History of present illness Pt is a 89 y.o. female with medical history significant of hypertension, hyperlipidemia, hypothyroidism, GERD, CAD status post stent, depression, anxiety, alpha 1 antitrypsin, COPD, anemia presenting with cough/shortness of breath and found to have acute PE.   OT comments  Pt seen for OT/PT co treatment on this date. Upon arrival to room pt supine in bed, agreeable to tx. Pt requires MODA+2 for bed mobility, MINA rolling in bed during MAXA for pericare at the bed level. Pt tolerated sitting on the EOB ~7 mins with MIN-MODA to remain upright, preventing posterior lean. Pt required MAXA for donning bilateral socks. RN aware of observed breakdown and redness on pt's sacral area and lower back. VSS, pt on 2L spo2 >93% throughout session. Pt making fair progress toward goals, will continue to follow POC. Discharge recommendation remains appropriate.        If plan is discharge home, recommend the following:  A lot of help with bathing/dressing/bathroom;Two people to help with walking and/or transfers   Equipment Recommendations  Other (comment)    Recommendations for Other Services      Precautions / Restrictions Precautions Precautions: Fall Recall of Precautions/Restrictions: Intact Required Braces or Orthoses:  (B float boots due to heel breakdown) Restrictions Weight Bearing Restrictions Per Provider Order: No       Mobility Bed Mobility Overal bed mobility: Needs Assistance Bed Mobility: Rolling, Supine to Sit, Sit to Supine Rolling: Mod assist, Used rails   Supine to sit: Mod assist, +2 for physical assistance Sit to supine: +2 for physical assistance, Max assist   General bed mobility comments:  (MINA for rolling either side, verbal/tactile cues for hand placement and sequencing)    Transfers Overall transfer level:  Needs assistance                 General transfer comment:  (Unable to tolerate standing on this date)     Balance Overall balance assessment: Needs assistance Sitting-balance support: Feet supported, Bilateral upper extremity supported Sitting balance-Leahy Scale: Poor   Postural control: Posterior lean   Standing balance-Leahy Scale: Zero                             ADL either performed or assessed with clinical judgement   ADL Overall ADL's : Needs assistance/impaired     Grooming: Wash/dry face;Wash/dry hands;Minimal assistance;Bed level               Lower Body Dressing: MAX assistance;Bed level Lower Body Dressing Details (indicate cue type and reason): Donning socks and floating heel boots     Toileting- Clothing Manipulation and Hygiene: Total assistance;Bed level;Cueing for sequencing         General ADL Comments: Unaware of soiled sheets on arrival to room - MAXA pericare at bed level     Communication Communication Communication: Impaired Factors Affecting Communication: Hearing impaired   Cognition Arousal: Alert Behavior During Therapy: WFL for tasks assessed/performed Cognition: Cognition impaired   Orientation impairments: Situation, Time Awareness: Intellectual awareness impaired, Online awareness impaired Memory impairment (select all impairments): Short-term memory, Working Biochemist, clinical functioning impairment (select all impairments): Initiation, Sequencing, Problem solving OT - Cognition Comments: Daughter at bedside reporting pt is becoming more confused                 Following commands: Impaired Following  commands impaired: Follows one step commands with increased time      Cueing   Cueing Techniques: Verbal cues, Tactile cues, Visual cues  Exercises Exercises: Other exercises Other Exercises Other Exercises: Edu: Role of OT treatment, goals for OT/PT co treatment on this date (Pt and daughter  educated on role of acute PT, recs for STR, and goals for co-tx session)    Shoulder Instructions       General Comments Pt noted to be lying in saturated sheets seemingly new and old urine. Pt unable to verbalize need for pericare. RN aware of situation.    Pertinent Vitals/ Pain       Pain Assessment Pain Assessment: 0-10 Pain Score: 7  Pain Location: Generalized all over Pain Descriptors / Indicators: Discomfort Pain Intervention(s): Limited activity within patient's tolerance, Repositioned                                                          Frequency  Min 2X/week        Progress Toward Goals  OT Goals(current goals can now be found in the care plan section)  Progress towards OT goals: Progressing toward goals  Acute Rehab OT Goals OT Goal Formulation: With patient/family Time For Goal Achievement: 11/24/23 Potential to Achieve Goals: Good ADL Goals Pt Will Perform Grooming: with supervision;sitting Pt Will Perform Upper Body Bathing: with supervision;sitting Pt Will Perform Lower Body Dressing: with min assist;sitting/lateral leans;sit to/from stand  Plan      Co-evaluation      Reason for Co-Treatment: For patient/therapist safety PT goals addressed during session: Mobility/safety with mobility OT goals addressed during session: ADL's and self-care      AM-PAC OT 6 Clicks Daily Activity     Outcome Measure   Help from another person eating meals?: A Little Help from another person taking care of personal grooming?: A Little Help from another person toileting, which includes using toliet, bedpan, or urinal?: Total Help from another person bathing (including washing, rinsing, drying)?: A Lot Help from another person to put on and taking off regular upper body clothing?: A Lot Help from another person to put on and taking off regular lower body clothing?: Total 6 Click Score: 12    End of Session Equipment Utilized  During Treatment: Oxygen  OT Visit Diagnosis: Other abnormalities of gait and mobility (R26.89);Muscle weakness (generalized) (M62.81)   Activity Tolerance Patient tolerated treatment well   Patient Left in bed;with call bell/phone within reach;with family/visitor present;with bed alarm set   Nurse Communication Other (comment) (Saturated sheets and need of new purwick)        Time: 8849-8763 OT Time Calculation (min): 46 min  Charges: OT General Charges $OT Visit: 1 Visit OT Treatments $Self Care/Home Management : 8-22 mins  Larraine Colas M.S. OTR/L  11/13/23, 2:18 PM

## 2023-11-13 NOTE — Progress Notes (Signed)
 Progress Note   Patient: Terri Braun FMW:[id number] DOB: [birth year] DOA: 11/08/2023     4 DOS: the patient was seen and examined on 11/13/2023      Brief hospital course: From HPI Diany Formosa is a 89 y.o. female with medical history significant of hypertension, hyperlipidemia, hypothyroidism, GERD, CAD status post stent, depression, anxiety, alpha 1 antitrypsin, COPD, anemia presenting with cough/shortness of breath/PE.   Patient has had some cough and shortness of breath though similar to her baseline shortness of breath for the past 2 to 3 weeks.  Saw pulmonologist yesterday and had a chest x-ray and labs done.  Labs showed elevated D-dimer and patient went for VQ scan today which was positive bilaterally.  DVT study also ordered which was negative.  Patient sent to the ED for PE.   Patient denies fevers, chills, chest pain, abdominal pain, constipation, diarrhea, nausea, vomiting.   ED Course: Vital signs in the ED notable for blood pressure in the 110s-120 systolic, respirate in the 20s.    Lab workup included BMP with sodium 133, potassium 5.4, bicarb 21, glucose 205, BUN 30, creatinine stable at 1.06, calcium  8.4.   CBC with leukocytosis of 13.1, hemoglobin stable at 8.9, platelets 525.  PT, INR, PTT normal.  Troponin normal.   Chest x-ray earlier today with left pleural effusion with atelectasis.  VQ scan showing the 2 wedge-shaped perfusion deficits consistent with positive/high risk PE.  DVT study negative bilaterally.   Patient started on heparin infusion and received DuoNeb in the ED.      Assessment and Plan:    Acute pulmonary embolism > Workup outpatient showed elevated D-dimer and VQ scan was ordered which demonstrated bilateral wedge-shaped infarcts, high risk/positive for PE.  No elevated troponin so low suspicion for right heart strain but this is difficult to evaluate as patient had VQ scan performed and not CT. > Not requiring oxygen.  Echocardiogram within  normal limit of EF Heparin drip transition to Eliquis today - Supportive care TOC working on SNF     Community-acquired pneumonia Patient requiring 3 L of oxygen Chest x-ray showing findings of pneumonia Continue ceftriaxone  and azithromycin   Hyperkalemia-status post Lokelma   Hypertension - Continue home losartan    Hyperlipidemia - Continue home rosuvastatin    GERD - Continue PPI   CAD - Continue home rosuvastatin , Plavix  - Starting anticoagulation as above   Depression Anxiety - Continue home duloxetine  and trazodone   COPD Heterozygous for alpha 1 antitrypsin - Replace home Trelegy with formulary Breztri - Continue as needed albuterol    Anemia > Hemoglobin stable at 8.9 - Trend CBC   DVT prophylaxis:      Heparin Code Status:              Full Family Communication:       Updated at bedside  Disposition Plan:      Subjective:  Denies nausea vomiting chest pain  Oxygen requirement still at 3 L Respiratory function as well as cough improving  Physical Exam:   Constitutional:      General: She is not in acute distress.    Appearance: Normal appearance.  HENT:     Head: Normocephalic and atraumatic.     Mouth/Throat:     Mouth: Mucous membranes are moist.     Pharynx: Oropharynx is clear.  Eyes:     Extraocular Movements: Extraocular movements intact.     Pupils: Pupils are equal, round, and reactive to light.  Cardiovascular:  Rate and Rhythm: Normal rate and regular rhythm.     Pulses: Normal pulses.     Heart sounds: Normal heart sounds.  Pulmonary:     Effort: Pulmonary effort is normal. No respiratory distress.     Breath sounds: Normal breath sounds.  Abdominal:     General: Bowel sounds are normal. There is no distension.     Palpations: Abdomen is soft.     Tenderness: There is no abdominal tenderness.  Musculoskeletal:        General: No swelling or deformity.  Skin:    General: Skin is warm and dry.  Neurological:     General:  No focal deficit present.     Mental Status: Mental status is at baseline.       Data reviewed:  Vitals:   11/13/23 0354 11/13/23 0741 11/13/23 1156 11/13/23 1548  BP: (!) 136/55 128/63 139/69 (!) 110/38  Pulse: 97 (!) 102 (!) 105 96  Resp: 17 18 16 18   Temp: 97.6 F (36.4 C) 99.5 F (37.5 C)  99.8 F (37.7 C)  TempSrc:      SpO2: 93% 94% 97% 90%  Weight:      Height:          Latest Ref Rng & Units 11/12/2023    2:11 AM 11/11/2023    4:49 AM 11/10/2023    5:38 AM  BMP  Glucose 70 - 99 mg/dL 893  897  882   BUN 8 - 23 mg/dL 27  25  27    Creatinine 0.44 - 1.00 mg/dL 9.15  9.20  9.17   Sodium 135 - 145 mmol/L 131  132  132   Potassium 3.5 - 5.1 mmol/L 4.4  4.6  4.7   Chloride 98 - 111 mmol/L 100  99  102   CO2 22 - 32 mmol/L 24  27  25    Calcium  8.9 - 10.3 mg/dL 8.3  8.7  8.5      Author: Drue ONEIDA Potter, MD 11/13/2023 6:13 PM  For on call review [email or web address].

## 2023-11-14 DIAGNOSIS — J189 Pneumonia, unspecified organism: Secondary | ICD-10-CM | POA: Diagnosis not present

## 2023-11-14 DIAGNOSIS — Z7189 Other specified counseling: Secondary | ICD-10-CM | POA: Diagnosis not present

## 2023-11-14 LAB — BASIC METABOLIC PANEL WITH GFR
Anion gap: 8 (ref 5–15)
BUN: 38 mg/dL — ABNORMAL HIGH (ref 8–23)
CO2: 24 mmol/L (ref 22–32)
Calcium: 7.7 mg/dL — ABNORMAL LOW (ref 8.9–10.3)
Chloride: 100 mmol/L (ref 98–111)
Creatinine, Ser: 1.08 mg/dL — ABNORMAL HIGH (ref 0.44–1.00)
GFR, Estimated: 48 mL/min — ABNORMAL LOW (ref >60)
Glucose, Bld: 114 mg/dL — ABNORMAL HIGH (ref 70–99)
Potassium: 4.1 mmol/L (ref 3.5–5.1)
Sodium: 132 mmol/L — ABNORMAL LOW (ref 135–145)

## 2023-11-14 LAB — HEMOGLOBIN: Hemoglobin: 6.6 g/dL — ABNORMAL LOW (ref 12.0–15.0)

## 2023-11-14 LAB — ABO/RH: ABO/RH(D): O POS

## 2023-11-14 LAB — PREPARE RBC (CROSSMATCH)

## 2023-11-14 MED ORDER — HYDROCODONE-ACETAMINOPHEN 5-325 MG PO TABS
1.0000 | ORAL_TABLET | ORAL | Status: DC | PRN
  Administered 2023-11-15 – 2023-11-23 (×19): 1 via ORAL
  Filled 2023-11-14 (×19): qty 1

## 2023-11-14 MED ORDER — SODIUM CHLORIDE 0.9% IV SOLUTION: Freq: Once | INTRAVENOUS | Status: AC

## 2023-11-14 MED ORDER — OXYMETAZOLINE HCL 0.05 % NA SOLN
1.0000 | Freq: Two times a day (BID) | NASAL | Status: AC
Start: 2023-11-15 — End: 2023-11-17
  Administered 2023-11-15 – 2023-11-17 (×4): 1 via NASAL
  Filled 2023-11-14: qty 15

## 2023-11-14 NOTE — Progress Notes (Signed)
 Progress Note   Patient: Terri Braun FMW:[id number] DOB: 11/29/1929 DOA: 11/08/2023     5 DOS: the patient was seen and examined on 11/14/2023   Brief hospital course: Terri Braun is a 89 y.o. female with medical history significant of hypertension, hyperlipidemia, hypothyroidism, GERD, CAD status post stent, depression, anxiety, alpha 1 antitrypsin, COPD, anemia presenting with cough/shortness of breath.  VQ scan confirmed acute bilateral PE. During hospitalization patient's oxygen requirement worsened and chest imaging showed findings concerning for pneumonia for which patient was initiated on antibiotics. Patient was also having cough with swallowing for which speech therapist was consulted, patient has been cleared to have a meal.  Palliative care also consulted for goals of care discussion.     Assessment and Plan:    Acute pulmonary embolism Workup outpatient showed elevated D-dimer and VQ scan was ordered which demonstrated bilateral wedge-shaped infarcts, high risk/positive for PE.   Echocardiogram within normal limit of EF Heparin drip transitioned to Eliquis  TOC working on SNF     Community-acquired pneumonia with hypoxia Patient requiring 3 L of oxygen Chest x-ray showing findings of pneumonia Continue ceftriaxone  and azithromycin   Hyperkalemia-status post Lokelma Monitor potassium closely  Hypertension - Continue home losartan    Hyperlipidemia - Continue home rosuvastatin    GERD - Continue PPI   CAD - Continue home rosuvastatin , Plavix  - Starting anticoagulation as above   Depression Anxiety - Continue home duloxetine  and trazodone   COPD Heterozygous for alpha 1 antitrypsin - Replace home Trelegy with formulary Breztri - Continue as needed albuterol    Normocytic anemia Hemoglobin 7.5 today No evidence of GI bleeding Given history of CAD we will give 1 unit today to maintain hb levels above 8   DVT prophylaxis:      Heparin  Code Status:               Full   Disposition Plan: TOC working on SNF     Subjective:  Denies nausea vomiting chest pain  Oxygen requirement still at 3 L He admits to improvement in the cough   Physical Exam:   Constitutional:      General: She is not in acute distress.    Appearance: Normal appearance.  HENT:     Head: Normocephalic and atraumatic.     Mouth/Throat:     Mouth: Mucous membranes are moist.     Pharynx: Oropharynx is clear.  Eyes:     Extraocular Movements: Extraocular movements intact.     Pupils: Pupils are equal, round, and reactive to light.  Cardiovascular:     Rate and Rhythm: Normal rate and regular rhythm.     Pulses: Normal pulses.     Heart sounds: Normal heart sounds.  Pulmonary:     Effort: Pulmonary effort is normal. No respiratory distress.     Breath sounds: Normal breath sounds.  Abdominal:     General: Bowel sounds are normal. There is no distension.     Palpations: Abdomen is soft.     Tenderness: There is no abdominal tenderness.  Musculoskeletal:        General: No swelling or deformity.  Skin:    General: Skin is warm and dry.  Neurological:     General: No focal deficit present.     Mental Status: Mental status is at baseline.       Data reviewed:    Latest Ref Rng & Units 11/12/2023    2:11 AM 11/11/2023    4:49 AM 11/10/2023  5:38 AM  CBC  WBC 4.0 - 10.5 K/uL 11.4  10.0  9.3   Hemoglobin 12.0 - 15.0 g/dL 7.5  8.7  7.8   Hematocrit 36.0 - 46.0 % 23.4  27.4  24.0   Platelets 150 - 400 K/uL 328  374  359        Latest Ref Rng & Units 11/14/2023    3:55 AM 11/12/2023    2:11 AM 11/11/2023    4:49 AM  BMP  Glucose 70 - 99 mg/dL 885  893  897   BUN 8 - 23 mg/dL 38  27  25   Creatinine 0.44 - 1.00 mg/dL 8.91  9.15  9.20   Sodium 135 - 145 mmol/L 132  131  132   Potassium 3.5 - 5.1 mmol/L 4.1  4.4  4.6   Chloride 98 - 111 mmol/L 100  100  99   CO2 22 - 32 mmol/L 24  24  27    Calcium  8.9 - 10.3 mg/dL 7.7  8.3  8.7     Vitals:    11/14/23 0021 11/14/23 0428 11/14/23 0747 11/14/23 1138  BP: (!) 88/35 (!) 92/33 (!) 116/42 (!) 128/51  Pulse: 85 83 88 91  Resp: 18 17 18 14   Temp: 98.1 F (36.7 C) 98.3 F (36.8 C) 98.3 F (36.8 C) 98.4 F (36.9 C)  TempSrc: Oral Oral    SpO2: 95% 97% 97% 95%  Weight:      Height:         Author: Drue ONEIDA Potter, MD 11/14/2023 2:46 PM  For on call review [email or web address].

## 2023-11-14 NOTE — Evaluation (Signed)
 Clinical/Bedside Swallow Evaluation Patient Details  Name: Terri Braun MRN: [id number] Date of Birth: 1929/04/12  Today's Date: 11/14/2023 Time: SLP Start Time (ACUTE ONLY): 1100 SLP Stop Time (ACUTE ONLY): 1200 SLP Time Calculation (min) (ACUTE ONLY): 60 min  Past Medical History:  Past Medical History:  Diagnosis Date   Anemia    ASCVD (arteriosclerotic cardiovascular disease)    COPD (chronic obstructive pulmonary disease) (HCC)    Diverticulosis    GERD (gastroesophageal reflux disease)    Hypertension    Hypothyroidism    Tibial fracture 05/03/2023   Past Surgical History:  Past Surgical History:  Procedure Laterality Date   APPENDECTOMY     BREAST SURGERY  1983   Lumpectomy   CHOLECYSTECTOMY     COLON SURGERY     COLONOSCOPY  11/04/2015   CORONARY ANGIOPLASTY WITH STENT PLACEMENT     NISSEN FUNDOPLICATION     TOTAL ABDOMINAL HYSTERECTOMY     With BSO   HPI:  Pt is a 89 y.o. female with medical history significant of Centrilobular Emphysema, hypertension, hyperlipidemia, hypothyroidism, GERD, MOD size Hiatal Hernia, CAD status post stent, depression, anxiety, alpha 1 antitrypsin, COPD, anemia presenting with cough/shortness of breath/PE.  A recent VQ scan revealed multiple PEs per chart note. At admit, patient stated she has had shortness of breath for a long time, does feel this is worse recently and specifically notes she has developed pleuritic chest pain, but cannot remember exactly when this started.  Pt lives at [facility] facility.    CXR this admit:  1. At least moderate sized left pleural effusion, which may suggest  underlying loculations versus decubitus imaging.  2. Left retrocardiac airspace opacity, suggesting combination of  left lung atelectasis and/or consolidation. Previous Imaging: Moderate-sized hiatal hernia.  Head Imaging 04/2023: Chronic microvascular ischemic change and cerebral volume loss.    Assessment / Plan / Recommendation  Clinical  Impression   Pt seen for BSE after NSG noted coughing when attempting to swallow Pills this morning. Pt is on a Regular diet since admit per MD. Pt awake, verbal but w/ declined Cognitive awareness noted- she was not oriented as to where she lives, the year. She stated she lived w/ her Daughter. She could not recall any breakfast items from her tray this morning. Unsure of pt's Baseline Cognitive functioning- she lives at a Skilled Facility per chart.  On [place] O2 2L(chronic O2 per NSG); afebrile.   Pt appears to present w/ grossly functional oropharyngeal phase swallowing w/ No overt, gross oropharyngeal phase dysphagia noted, No neuromuscular deficits noted. Pt appeared to exhibit generalized weakness w/ all tasks and distraction requiring direction during tasks. Pt consumed po trials w/ No overt, clinical s/s of aspiration during the po trials.  Pt appears at reduced risk for aspiration when following general aspiration precautions and using an easy to eat diet for conservation of energy (PEs baseline this admit). However, pt does have challenging factors that could impact her oropharyngeal swallowing to include: Esophageal phase Dysmotility; Cognitive decline apparent currently; need for FULL support w/ setup/Supervision w/ po intake; need for support w/ feeding; deconditioning/weakness; Baseline GERD and Hiatal Hernia; Advanced Age.  These factors can increase risk for dysphagia as well as decreased oral intake overall.   During po trials, pt consumed all consistencies helping to feed self given FULL support w/ no overt coughing, decline in vocal quality, or change in respiratory presentation during/post trials. O2 sats remained 98% when checked. Oral phase appeared grossly [facility] w/  timely bolus management, mastication, and control of bolus propulsion for A-P transfer for swallowing. Oral clearing achieved w/ all trial consistencies -- moistened, soft foods given in cut, small pieces for ease of chewing  and conservation of energy.  OM Exam was cursory but appeared [facility] w/ no unilateral weakness noted. Speech intelligible, clear.   Recommend a more Mech Soft/regular consistency diet w/ well-Cut meats, moistened foods for ease of chewing/intake(conservation of energy); Thin liquids -- NO STRAWS and pt should help to Hold Cup when drinking. Rest Breaks during meals for Esophageal phase clearing d/t Hiatal Hernia; GERD. Remain upright post meals. Recommend general aspiration precautions including small bites/sips Slowly; sit fully upright for oral intake. Feeding support and Supervision at meals. Pills CRUSHED vs WHOLE in Puree for safer, easier swallowing -- it was encouraged now and for D/C. REFLUX precautions.   Education given on Pills in Puree; food consistencies and easy to eat options; general aspiration and REFLUX precautions to pt and NSG. Will leave education for Family. Pt appears at/close to her swallowing baseline (suspected) in setting of her Chronic Comorbidities. Recommend f/u at her SNF if needs indicate for ongoing Education w/ Family and pt. NSG to reconsult if any new needs arise during this admit. NSG updated, agreed. MD updated. Recommend Palliative Care f/u for GOC and Dietician f/u for support. SLP Visit Diagnosis: Dysphagia, unspecified (R13.10) (Esophageal phase Dysmotility; Cognitive decline apparent currently; need for FULL support w/ setup/Supervision w/ po intake; need for support w/ feeding; Baseline GERD and Hiatal Hernia; Advanced Age)    Aspiration Risk  Mild aspiration risk;Risk for inadequate nutrition/hydration (reduced when following general precautions)    Diet Recommendation   Thin;Dysphagia 3 (mechanical soft) (gravies) = a more Mech Soft/regular consistency diet w/ well-Cut meats, moistened foods for ease of chewing/intake(conservation of energy); Thin liquids -- NO STRAWS and pt should help to Hold Cup when drinking. Rest Breaks during meals for Esophageal phase  clearing d/t Hiatal Hernia; GERD. Remain upright post meals. Recommend general aspiration precautions including small bites/sips Slowly; sit fully upright for oral intake. Feeding support and Supervision at meals. REFLUX precautions.   Medication Administration: Crushed with puree (vs Whole in puree if able)    Other  Recommendations Recommended Consults:  (Palliative Care consult for GOC, education.  Dietician f/u.) Oral Care Recommendations: Oral care BID;Staff/trained caregiver to provide oral care     Assistance Recommended at Discharge  FULL d/t Cognition  Functional Status Assessment Patient has had a recent decline in their functional status and/or demonstrates limited ability to make significant improvements in function in a reasonable and predictable amount of time (chronic comorbidities)  Frequency and Duration  (n/a)   (n/a)       Prognosis Prognosis for improved oropharyngeal function: Fair Barriers to Reach Goals: Cognitive deficits;Time post onset;Severity of deficits Barriers/Prognosis Comment: Esophageal phase Dysmotility; Cognitive decline apparent currently; need for FULL support w/ setup/Supervision w/ po intake; need for support w/ feeding; Baseline GERD and Hiatal Hernia; Advanced Age      Swallow Study   General Date of Onset: 11/08/23 HPI: Pt is a 89 y.o. female with medical history significant of Centrilobular Emphysema, hypertension, hyperlipidemia, hypothyroidism, GERD, MOD size Hiatal Hernia, CAD status post stent, depression, anxiety, alpha 1 antitrypsin, COPD, anemia presenting with cough/shortness of breath/PE.  A recent VQ scan revealed multiple PEs per chart note. At admit, patient stated she has had shortness of breath for a long time, does feel this is worse recently and specifically notes  she has developed pleuritic chest pain, but cannot remember exactly when this started.  Pt lives at [facility] facility.    CXR this admit:  1. At least moderate sized  left pleural effusion, which may suggest  underlying loculations versus decubitus imaging.  2. Left retrocardiac airspace opacity, suggesting combination of  left lung atelectasis and/or consolidation. Previous Imaging: Moderate-sized hiatal hernia.  Head Imaging 04/2023: Chronic microvascular ischemic change and cerebral volume loss. Type of Study: Bedside Swallow Evaluation Previous Swallow Assessment: none Diet Prior to this Study: Regular;Thin liquids (Level 0) Temperature Spikes Noted: No (wbc 11.4) Respiratory Status: Nasal cannula (2L - chronic per NSG) History of Recent Intubation: No Behavior/Cognition: Alert;Cooperative;Pleasant mood;Confused;Distractible;Requires cueing (unsure of Baseline Cognition) Oral Cavity Assessment: Within Functional Limits Oral Care Completed by SLP: Yes Oral Cavity - Dentition: Adequate natural dentition Vision: Functional for self-feeding Self-Feeding Abilities: Able to feed self;Needs assist;Needs set up;Total assist (distracted) Patient Positioning: Upright in bed (FULL assist) Baseline Vocal Quality: Normal Volitional Cough: Strong;Congested (min)    Oral/Motor/Sensory Function Overall Oral Motor/Sensory Function: Within functional limits   Ice Chips Ice chips: Within functional limits Presentation: Spoon (fed; 3 trials)   Thin Liquid Thin Liquid: Within functional limits Presentation: Cup;Self Fed (12 trials total) Other Comments: NO Straws    Nectar Thick Nectar Thick Liquid: Not tested   Honey Thick Honey Thick Liquid: Not tested   Puree Puree: Within functional limits Presentation: Spoon (fed; 9 trials)   Solid     Solid: Impaired (fair) Presentation: Spoon (fed; 6 trials) Oral Phase Impairments: Impaired mastication (min increased time) Pharyngeal Phase Impairments:  (none)         Comer Portugal, MS, CCC-SLP Speech Language Pathologist Rehab Services; [facility] [phone] (ascom) Joelly Bolanos 11/14/2023,4:53  PM
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
## 2023-11-14 NOTE — Progress Notes (Signed)
 Pt noted to have coughing during med administration with water. Pt able to clear throat and continued coughing with thin liquids. Speech consult placed and MD notified. Pt made NPO.

## 2023-11-14 NOTE — Consult Note (Signed)
 Consultation Note Date: 11/14/2023   Patient Name: Terri Braun  DOB: [birth year]  MRN: [id number]  Age / Sex: 89 y.o., female  PCP: Lenon Layman ORN, MD Referring Physician: Dorinda Drue DASEN, MD  Reason for Consultation: Establishing goals of care  HPI/Patient Profile: Kasy Iannacone is a 89 y.o. female with medical history significant of hypertension, hyperlipidemia, hypothyroidism, GERD, CAD status post stent, depression, anxiety, alpha 1 antitrypsin, COPD, anemia presenting with cough/shortness of breath/PE.    Clinical Assessment and Goals of Care: Notes and labs reviewed.  In to see patient.  She is currently resting in bed at this time eating a cracker; no distress noted.  Her daughter is at bedside.  Her daughter focuses on current concerns, and discusses them.  She discusses plans for discharge to SNF, and discusses desiring to ensure her mother does not discharge too soon.  She states she has questions regarding her lab work and diagnostics.  She discusses a nosebleed that her mother had earlier today.  We discussed the drying effect of oxygen by nasal cannula.  Discussed relaying daughter's concerns to attending team.  PMT will follow-up tomorrow.  SUMMARY OF RECOMMENDATIONS   PMT will follow-up tomorrow      Primary Diagnoses: Present on Admission:  Mixed hyperlipidemia  Moderate episode of recurrent major depressive disorder (HCC)  Hypothyroidism  GERD (gastroesophageal reflux disease)  Essential hypertension, benign  COPD (chronic obstructive pulmonary disease) (HCC)  CAD (coronary artery disease)  Anxiety  Anemia  Acute pulmonary embolism (HCC)   I have reviewed the medical record, interviewed the patient and family, and examined the patient. The following aspects are pertinent.  Past Medical History:  Diagnosis Date   Anemia    ASCVD (arteriosclerotic cardiovascular  disease)    COPD (chronic obstructive pulmonary disease) (HCC)    Diverticulosis    GERD (gastroesophageal reflux disease)    Hypertension    Hypothyroidism    Tibial fracture 05/03/2023   Social History   Socioeconomic History   Marital status: Widowed    Spouse name: Not on file   Number of children: Not on file   Years of education: Not on file   Highest education level: Not on file  Occupational History   Not on file  Tobacco Use   Smoking status: Never   Smokeless tobacco: Never  Vaping Use   Vaping status: Never Used  Substance and Sexual Activity   Alcohol use: Never   Drug use: Never   Sexual activity: Not on file  Other Topics Concern   Not on file  Social History Narrative   She is widowed she has sons and daughters, she moved here from [place]  to live in [place] near her daughter   2 caffeinated beverages a day   Rare alcohol   Never smoker   Social Drivers of Corporate investment banker Strain: Low Risk  (08/11/2023)   Received from [facility]   Overall Financial Resource Strain (CARDIA)    Difficulty of Paying Living  Expenses: Not very hard  Food Insecurity: No Food Insecurity (11/10/2023)   Hunger Vital Sign    Worried About Running Out of Food in the Last Year: Never true    Ran Out of Food in the Last Year: Never true  Transportation Needs: No Transportation Needs (11/10/2023)   PRAPARE - Administrator, Civil Service (Medical): No    Lack of Transportation (Non-Medical): No  Physical Activity: Insufficiently Active (09/29/2021)   Exercise Vital Sign    Days of Exercise per Week: 2 days    Minutes of Exercise per Session: 60 min  Stress: No Stress Concern Present (09/29/2021)   [employer] of Occupational Health - Occupational Stress Questionnaire    Feeling of Stress : Only a little  Social Connections: Moderately Isolated (11/10/2023)   Social Connection and Isolation Panel    Frequency  of Communication with Friends and Family: More than three times a week    Frequency of Social Gatherings with Friends and Family: More than three times a week    Attends Religious Services: More than 4 times per year    Active Member of [employer] or Organizations: No    Attends Banker Meetings: Never    Marital Status: Widowed   Family History  Problem Relation Age of Onset   Coronary artery disease Mother    Heart disease Mother    Crohn's disease Son    Cancer Other        other family member with breast cancer   Scheduled Meds:  apixaban  10 mg Oral BID   Followed by   NOREEN ON 11/18/2023] apixaban  5 mg Oral BID   budesonide-glycopyrrolate-formoterol  2 puff Inhalation BID   celecoxib   100 mg Oral Daily   clopidogrel   75 mg Oral Daily   dextromethorphan-guaiFENesin  1 tablet Oral BID   DULoxetine   60 mg Oral Daily   feeding supplement  237 mL Oral BID BM   Influenza vac split trivalent PF  0.5 mL Intramuscular Tomorrow-1000   isosorbide  mononitrate  30 mg Oral Daily   losartan   50 mg Oral Daily   multivitamin with minerals  1 tablet Oral Daily   pantoprazole   20 mg Oral Daily   rosuvastatin   10 mg Oral QHS   sodium chloride  flush  3 mL Intravenous Q12H   traZODone  100 mg Oral QHS   Continuous Infusions:  azithromycin 500 mg (11/13/23 1945)   cefTRIAXone  (ROCEPHIN )  IV 1 g (11/13/23 1342)   PRN Meds:.acetaminophen  **OR** acetaminophen , albuterol , polyethylene glycol Medications Prior to Admission:  Prior to Admission medications   Medication Sig Start Date End Date Taking? Authorizing Provider  albuterol  (VENTOLIN  HFA) 108 (90 Base) MCG/ACT inhaler TAKE 2 PUFFS BY MOUTH EVERY 6 HOURS AS NEEDED FOR WHEEZE OR SHORTNESS OF BREATH 04/27/22  Yes Tamea Dedra CROME, MD  alendronate (FOSAMAX) 70 MG tablet alendronate 70 mg tablet  TAKE 1 TABLET BY MOUTH ONCE WEEKLY   Yes [provider]  bisacodyl  (DULCOLAX) 5 MG EC tablet Take 1 tablet (5 mg total) by  mouth daily as needed for moderate constipation. 05/11/23  Yes Caleen Qualia, MD  celecoxib  (CELEBREX ) 100 MG capsule TAKE 1 CAPSULE BY MOUTH EVERY DAY 10/08/20  Yes Bertrum Charlie CROME, MD  cholecalciferol (VITAMIN D3) 25 MCG (1000 UNIT) tablet Take 2,000 Units by mouth daily.   Yes [provider]  clopidogrel  (PLAVIX ) 75 MG tablet TAKE 1 TABLET BY MOUTH EVERY DAY 06/21/21  Yes  Bertrum Charlie CROME, MD  dextromethorphan-guaiFENesin (ROBITUSSIN-DM) 10-100 MG/5ML liquid Take by mouth every 4 (four) hours as needed for cough.   Yes [provider]  DULoxetine  (CYMBALTA ) 60 MG capsule Take 60 mg by mouth daily.   Yes [provider]  HYDROcodone -acetaminophen  (NORCO/VICODIN) 5-325 MG tablet Take 1-2 tablets by mouth every 6 (six) hours as needed for moderate pain (pain score 4-6). 05/11/23  Yes Caleen Qualia, MD  isosorbide  mononitrate (IMDUR ) 30 MG 24 hr tablet Take 1 tablet (30 mg total) by mouth daily. 04/26/22  Yes Ostwalt, Janna, PA-C  lidocaine  (LIDODERM ) 5 % Place 1 patch onto the skin daily. 04/26/23  Yes [provider]  losartan  (COZAAR ) 50 MG tablet Take 50 mg by mouth daily.   Yes [provider]  Multiple Vitamin (MULTIVITAMIN WITH MINERALS) TABS tablet Take 1 tablet by mouth daily. 05/12/23  Yes Caleen Qualia, MD  nitroGLYCERIN  (NITROSTAT ) 0.4 MG SL tablet Place 1 tablet (0.4 mg total) under the tongue every 5 (five) minutes as needed for chest pain. 11/11/22  Yes Furth, Cadence H, PA-C  pantoprazole  (PROTONIX ) 20 MG tablet Take 20 mg by mouth daily.   Yes [provider]  polyethylene glycol (MIRALAX  / GLYCOLAX ) 17 g packet Take 17 g by mouth 2 (two) times daily. 05/11/23  Yes Caleen Qualia, MD  rosuvastatin  (CRESTOR ) 10 MG tablet TAKE 1 TABLET BY MOUTH EVERY DAY 12/19/22  Yes Gollan, Timothy J, MD  traZODone (DESYREL) 100 MG tablet Take 100 mg by mouth at bedtime.   Yes [provider]  TRELEGY ELLIPTA  100-62.5-25 MCG/ACT AEPB TAKE 1  PUFF BY MOUTH EVERY DAY 01/30/23  Yes Tamea Dedra CROME, MD  feeding supplement (ENSURE ENLIVE / ENSURE PLUS) LIQD Take 237 mLs by mouth 2 (two) times daily between meals. 05/11/23   Caleen Qualia, MD   Allergies  Allergen Reactions   Shellfish Allergy Anaphylaxis    Ended up in the ED after eating shellfish, caused diarrhea and severe GI upset , unsure if caused SOB   Shellfish Protein-Containing Drug Products     Other reaction(s): Not available   Sulfa Antibiotics    Review of Systems  All other systems reviewed and are negative.   Physical Exam Pulmonary:     Effort: Pulmonary effort is normal.  Neurological:     Mental Status: She is alert.     Vital Signs: BP (!) 128/51 (BP Location: Left Arm)   Pulse 91   Temp 98.4 F (36.9 C)   Resp 14   Ht 5' 5 (1.651 m)   Wt 64.4 kg   SpO2 95%   BMI 23.63 kg/m  Pain Scale: 0-10   Pain Score: 0-No pain   SpO2: SpO2: 95 % O2 Device:SpO2: 95 % O2 Flow Rate: .O2 Flow Rate (L/min): 2 L/min  IO: Intake/output summary:  Intake/Output Summary (Last 24 hours) at 11/14/2023 1613 Last data filed at 11/13/2023 2145 Gross per 24 hour  Intake 480 ml  Output 600 ml  Net -120 ml    LBM: Last BM Date : 11/09/23 Baseline Weight: Weight: 64.4 kg Most recent weight: Weight: 64.4 kg       Signed by: Camelia Lewis, NP   Please contact Palliative Medicine Team phone at [phone] for questions and concerns.  For individual provider: See Tracey

## 2023-11-15 DIAGNOSIS — Z7189 Other specified counseling: Secondary | ICD-10-CM

## 2023-11-15 LAB — CBC
HCT: 24.5 % — ABNORMAL LOW (ref 36.0–46.0)
Hemoglobin: 7.7 g/dL — ABNORMAL LOW (ref 12.0–15.0)
MCH: 28.2 pg (ref 26.0–34.0)
MCHC: 31.4 g/dL (ref 30.0–36.0)
MCV: 89.7 fL (ref 80.0–100.0)
Platelets: 308 K/uL (ref 150–400)
RBC: 2.73 MIL/uL — ABNORMAL LOW (ref 3.87–5.11)
RDW: 18.5 % — ABNORMAL HIGH (ref 11.5–15.5)
WBC: 8.9 K/uL (ref 4.0–10.5)
nRBC: 0 % (ref 0.0–0.2)

## 2023-11-15 LAB — BASIC METABOLIC PANEL WITH GFR
Anion gap: 8 (ref 5–15)
BUN: 31 mg/dL — ABNORMAL HIGH (ref 8–23)
CO2: 24 mmol/L (ref 22–32)
Calcium: 7.6 mg/dL — ABNORMAL LOW (ref 8.9–10.3)
Chloride: 99 mmol/L (ref 98–111)
Creatinine, Ser: 0.87 mg/dL (ref 0.44–1.00)
GFR, Estimated: >60 mL/min (ref >60)
Glucose, Bld: 118 mg/dL — ABNORMAL HIGH (ref 70–99)
Potassium: 4.1 mmol/L (ref 3.5–5.1)
Sodium: 131 mmol/L — ABNORMAL LOW (ref 135–145)

## 2023-11-15 LAB — TYPE AND SCREEN
ABO/RH(D): O POS
Antibody Screen: NEGATIVE
Unit division: 0

## 2023-11-15 LAB — BPAM RBC
Blood Product Expiration Date: 202511172359
ISSUE DATE / TIME: 202510142033
Unit Type and Rh: 5100

## 2023-11-15 MED ORDER — SODIUM CHLORIDE 1 G PO TABS
1.0000 g | ORAL_TABLET | Freq: Two times a day (BID) | ORAL | Status: DC
  Administered 2023-11-15 – 2023-11-16 (×2): 1 g via ORAL
  Filled 2023-11-15 (×2): qty 1

## 2023-11-15 MED ORDER — SODIUM CHLORIDE 0.9 % IV SOLN
1.0000 g | INTRAVENOUS | Status: DC
  Filled 2023-11-15: qty 10

## 2023-11-15 NOTE — Progress Notes (Addendum)
 0720 Per night RN patient chocking on thin liquids so she gave her NTL last night ordered changed  this morning. Patient HBG 6.6 was given 1 unit blood no recheck completed CBC order placed per protocol  0900 patient chocking on food this morning when eating son-in-law at bedside patient encouraged patient to slow down eating patient was positioned upright before having breakfast

## 2023-11-15 NOTE — Progress Notes (Signed)
 Patient has a nosebleed. It been packed with gauze. Notified Dr.Duncan, received orders for affrin.

## 2023-11-15 NOTE — Plan of Care (Signed)
  Problem: Clinical Measurements: Goal: Respiratory complications will improve Outcome: Not Progressing Goal: Cardiovascular complication will be avoided Outcome: Progressing   Problem: Activity: Goal: Risk for activity intolerance will decrease Outcome: Not Progressing

## 2023-11-15 NOTE — TOC Progression Note (Signed)
 Transition of Care [facility]) - Progression Note    Patient Details  Name: Terri Braun MRN: [id number] Date of Birth: 1929-03-28  Transition of Care [facility]) CM/SW Contact  Corean ONEIDA Haddock, RN Phone Number: 11/15/2023, 2:47 PM  Clinical Narrative:     Per MD denied as she is not medically ready for discharge today and said we will have to re-apply once she is ready   [place] at [facility] updated                      Expected Discharge Plan and Services                                               Social Drivers of Health (SDOH) Interventions SDOH Screenings   Food Insecurity: No Food Insecurity (11/10/2023)  Housing: Low Risk  (11/10/2023)  Transportation Needs: No Transportation Needs (11/10/2023)  Utilities: Not At Risk (11/10/2023)  Alcohol Screen: Low Risk  (09/29/2021)  Depression (PHQ2-9): Low Risk  (09/29/2021)  Financial Resource Strain: Low Risk  (08/11/2023)   Received from [facility]  Physical Activity: Insufficiently Active (09/29/2021)  Social Connections: Moderately Isolated (11/10/2023)  Stress: No Stress Concern Present (09/29/2021)  Tobacco Use: Low Risk  (11/08/2023)    Readmission Risk Interventions     No data to display

## 2023-11-15 NOTE — Progress Notes (Signed)
 Progress Note   Patient: Terri Braun FMW:[id number] DOB: 07-16-1929 DOA: 11/08/2023     6 DOS: the patient was seen and examined on 11/15/2023   Brief hospital course: Nerissa Constantin is a 89 y.o. female with medical history significant of hypertension, hyperlipidemia, hypothyroidism, GERD, CAD status post stent, depression, anxiety, alpha 1 antitrypsin, COPD, anemia presenting with cough/shortness of breath.  VQ scan confirmed acute bilateral PE. During hospitalization patient's oxygen requirement worsened and chest imaging showed findings concerning for pneumonia for which patient was initiated on antibiotics. Patient was also having cough with swallowing for which speech therapist was consulted, patient has been cleared to have a meal.  Palliative care also consulted for goals of care discussion.    Acute pulmonary embolism Workup outpatient showed elevated D-dimer and VQ scan was ordered which demonstrated bilateral wedge-shaped infarcts, high risk/positive for PE.   Echocardiogram within normal limit of EF Heparin drip transitioned to Eliquis  TOC working on SNF   Community-acquired pneumonia with hypoxia Patient requiring 2 L of oxygen Chest x-ray showing findings of pneumonia Continue ceftriaxone  and azithromycin Wean oxygen as able Seen by SLP  Hyponatremia, mild - salt tabs, Monitor sodium   Hyperkalemia-status post Lokelma Monitor potassium closely  Hypertension - Continue home losartan    Hyperlipidemia - Continue home rosuvastatin    GERD - Continue PPI   CAD - Continue home rosuvastatin , Plavix  - Starting anticoagulation as above   Depression Anxiety - Continue home duloxetine  and trazodone   COPD Heterozygous for alpha 1 antitrypsin - Replace home Trelegy with formulary Breztri - Continue as needed albuterol    Normocytic anemia Hb dropped to 6.6, s/p 1u pRBC improved to 7.7 No evidence of GI bleeding Monitor Hb   -- GOC Palliative care  following, patient and daughter leaning towards returning back to facility with hospice care [facility] for peer to peer review, was denied and asked to re-appeal once medically stable for discharge    DVT prophylaxis:      Heparin  Code Status:              Full   Disposition Plan: TOC working on SNF     Subjective:  Denies nausea vomiting chest pain  Oxygen requirement still at 2 L, s/p 1u pRBC Hb 7.7 Will wean O2 as able and monitor Hb   Physical Exam:   Constitutional:      General: She is not in acute distress.    Appearance: Normal appearance.  Cardiovascular:     Rate and Rhythm: Normal rate and regular rhythm.     Pulses: Normal pulses.     Heart sounds: Normal heart sounds.  Pulmonary:     Effort: Pulmonary effort is normal. No respiratory distress.     Breath sounds: Normal breath sounds.  Abdominal:     General: Bowel sounds are normal. There is no distension.     Palpations: Abdomen is soft.     Tenderness: There is no abdominal tenderness.  Musculoskeletal:        General: No swelling or deformity.  Skin:    General: Skin is warm and dry.  Neurological:     General: No focal deficit present.     Mental Status: Mental status is at baseline.       Data reviewed:    Latest Ref Rng & Units 11/15/2023    7:50 AM 11/14/2023    6:40 PM 11/12/2023    2:11 AM  CBC  WBC 4.0 - 10.5 K/uL 8.9  11.4   Hemoglobin 12.0 - 15.0 g/dL 7.7  6.6  7.5   Hematocrit 36.0 - 46.0 % 24.5   23.4   Platelets 150 - 400 K/uL 308   328        Latest Ref Rng & Units 11/15/2023    3:55 AM 11/14/2023    3:55 AM 11/12/2023    2:11 AM  BMP  Glucose 70 - 99 mg/dL 881  885  893   BUN 8 - 23 mg/dL 31  38  27   Creatinine 0.44 - 1.00 mg/dL 9.12  8.91  9.15   Sodium 135 - 145 mmol/L 131  132  131   Potassium 3.5 - 5.1 mmol/L 4.1  4.1  4.4   Chloride 98 - 111 mmol/L 99  100  100   CO2 22 - 32 mmol/L 24  24  24    Calcium  8.9 - 10.3 mg/dL 7.6  7.7  8.3     Vitals:    11/14/23 2051 11/14/23 2310 11/15/23 0412 11/15/23 0850  BP: (!) 126/50 (!) 117/44 (!) 135/56 139/62  Pulse: 97 87 85 89  Resp: 18 18  18   Temp: 99.3 F (37.4 C) 98.5 F (36.9 C) 98.3 F (36.8 C) 98.2 F (36.8 C)  TempSrc: Oral  Oral   SpO2: 97% 97% 98% 97%  Weight:      Height:         Author: Laree Lock, MD 11/15/2023 9:33 AM  For on call review [email or web address].

## 2023-11-15 NOTE — Plan of Care (Signed)

## 2023-11-15 NOTE — TOC Progression Note (Addendum)
 Transition of Care [facility]) - Progression Note    Patient Details  Name: Terri Braun MRN: [id number] Date of Birth: 02/24/1929  [facility]) CM/SW Contact  Lauraine JAYSON Carpen, LCSW Phone Number: 11/15/2023, 8:51 AM  Clinical Narrative:   SNF insurance authorization is still pending.  10:47 am: Insurance is offering a peer-to-peer review by 3:00 today. Sent details to MD in a secure chat. Sent update to daughter by text message.  Expected Discharge Plan and Services                                               Social Drivers of Health (SDOH) Interventions SDOH Screenings   Food Insecurity: No Food Insecurity (11/10/2023)  Housing: Low Risk  (11/10/2023)  Transportation Needs: No Transportation Needs (11/10/2023)  Utilities: Not At Risk (11/10/2023)  Alcohol Screen: Low Risk  (09/29/2021)  Depression (PHQ2-9): Low Risk  (09/29/2021)  Financial Resource Strain: Low Risk  (08/11/2023)   Received from [facility]  Physical Activity: Insufficiently Active (09/29/2021)  Social Connections: Moderately Isolated (11/10/2023)  Stress: No Stress Concern Present (09/29/2021)  Tobacco Use: Low Risk  (11/08/2023)    Readmission Risk Interventions     No data to display

## 2023-11-15 NOTE — Progress Notes (Addendum)
 Daily Progress Note   Patient Name: Terri Braun       Date: 11/15/2023 DOB: 04-Mar-1929  Age: 89 y.o. MRN#: [id number] Attending Physician: Jerelene Critchley, MD Primary Care Physician: Lenon Layman ORN, MD Admit Date: 11/08/2023  Reason for Consultation/Follow-up: Establishing goals of care  Subjective: Notes and labs reviewed.  Spoke with nursing who advises patient had coughing overnight with oral intake, and coughing this morning.  Per staff poor p.o. intake.  Patient is currently on a dysphagia nectar thick diet.  Spoke with SLP who discusses concerns for age, PE, COPD, esophageal dysmotility, cognitive status, all placing patient at risk for aspiration.  In to see patient.  She has lunch sitting in front of her with nectar thick liquids, which is untouched.  She states she does not like the food or liquids.  She is currently sitting in bed at this time with her daughter at bedside.  Patient lives at a nursing facility at baseline.  We discussed her diagnoses, prognosis, GOC, EOL wishes disposition and options.  Created space and opportunity for patient  to explore thoughts and feelings regarding current medical information.   A detailed discussion was had today regarding advanced directives.  Concepts specific to code status, artifical feeding and hydration, IV antibiotics and rehospitalization were discussed.  The difference between an aggressive medical intervention path and a comfort care path was discussed.  Values and goals of care important to patient and family were attempted to be elicited.  Discussed limitations of medical interventions to prolong quality of life in some situations and discussed the concept of human mortality.  Patient states she is a woman of faith, daughter  states she is married to her pastor.  Patient has advanced directives that list her daughter Terri Braun as her H POA.  Patient discusses that her husband was on life support before he died, and the decision had to be made to withdraw.  She discusses completing a living will and marking at such that she does not want her life prolonged by artificial means.  She states quality of life is important to her such that she wants to be able to do ADLs and care for herself.  She states she does not want to be in a situation of having to be fed, or cleaned.  Patient states she is tired  and has been ready to die.  She states she wants no further life-prolonging care and wants comfort focused care for what time she has left on this earth.  Her daughter states she has been asking why she is still on earth at 89 years old, and why the Terri Braun has not taken her.  Patient looks to her daughter several times to ensure she is okay with the decision she is making.  She confirms she would never want CPR if her heart and breathing stop.  She states she would never want to be placed on life support.  She would never want dialysis.  She would never want a feeding tube.  We discussed stopping antibiotics as she does not want further life-prolonging care, and daughter is in agreement with patient.  We discussed stopping lab draws as she finds these unpleasant, and she is in agreement with this as well.  She states she wants to eat and drink what she wants to without restriction.  She desires to recline back in bed while eating.  They are aware of risks.   I completed a MOST form today with patient while daughter was at bedside and the signed original was placed in the chart. Each section of options on the form were reviewed in full detail and any questions were answered as needed. The form was scanned and sent to medical records for it to be uploaded under ACP tab in Epic. A photocopy was also placed in the chart to be scanned into EMR.  The patient outlined their wishes for the following treatment decisions:  Cardiopulmonary Resuscitation: Do Not Attempt Resuscitation (DNR/No CPR)  Medical Interventions: Comfort Measures: Keep clean, warm, and dry. Use medication by any route, positioning, wound care, and other measures to relieve pain and suffering. Use oxygen, suction and manual treatment of airway obstruction as needed for comfort. Do not transfer to the hospital unless comfort needs cannot be met in current location.  Antibiotics: No antibiotics (use other measures to relieve symptoms)  IV Fluids: No IV fluids (provide other measures to ensure comfort)  Feeding Tube: No feeding tube    ADDENDUM: Returned to bedside to check on patient. She was resting quietly leaning to the left, with her arm tucked, sleeping.  Daughter is at bedside.  Daughter discusses that they had been at facility for rehab, and are hopeful to return there with hospice care if possible.  Patient awoke during my visit and began drinking Ensure.  Coughing noted with drinking Ensure.  Per daughter patient has been coughing with intake.  Discussed minimal intake.  Length of Stay: 6  Current Medications: Scheduled Meds:   apixaban  10 mg Oral BID   Followed by   NOREEN ON 11/18/2023] apixaban  5 mg Oral BID   budesonide-glycopyrrolate-formoterol  2 puff Inhalation BID   celecoxib   100 mg Oral Daily   clopidogrel   75 mg Oral Daily   dextromethorphan-guaiFENesin  1 tablet Oral BID   DULoxetine   60 mg Oral Daily   feeding supplement  237 mL Oral BID BM   Influenza vac split trivalent PF  0.5 mL Intramuscular Tomorrow-1000   isosorbide  mononitrate  30 mg Oral Daily   losartan   50 mg Oral Daily   multivitamin with minerals  1 tablet Oral Daily   oxymetazoline  1 spray Each Nare BID   pantoprazole   20 mg Oral Daily   rosuvastatin   10 mg Oral QHS   sodium chloride  flush  3 mL Intravenous Q12H  traZODone  100 mg Oral QHS    Continuous Infusions:   azithromycin 500 mg (11/14/23 1742)   cefTRIAXone  (ROCEPHIN )  IV 1 g (11/15/23 1450)    PRN Meds: acetaminophen  **OR** acetaminophen , albuterol , HYDROcodone -acetaminophen , polyethylene glycol  Physical Exam Pulmonary:     Effort: Pulmonary effort is normal.  Skin:    General: Skin is warm and dry.  Neurological:     Mental Status: She is alert.             Vital Signs: BP (!) 117/47 (BP Location: Right Arm)   Pulse 85   Temp 98.2 F (36.8 C)   Resp 18   Ht 5' 5 (1.651 m)   Wt 64.4 kg   SpO2 96%   BMI 23.63 kg/m  SpO2: SpO2: 96 % O2 Device: O2 Device: Nasal Cannula O2 Flow Rate: O2 Flow Rate (L/min): 2 L/min  Intake/output summary:  Intake/Output Summary (Last 24 hours) at 11/15/2023 1528 Last data filed at 11/15/2023 1519 Gross per 24 hour  Intake 635.5 ml  Output 950 ml  Net -314.5 ml   LBM: Last BM Date : 11/09/23 Baseline Weight: Weight: 64.4 kg Most recent weight: Weight: 64.4 kg  Patient Active Problem List   Diagnosis Date Noted   Acute pulmonary embolism (HCC) 11/08/2023   Frequent falls 05/12/2023   Closed fracture of proximal end of left fibula 05/05/2023   Closed nondisplaced oblique fracture of shaft of left tibia 05/05/2023   UTI (urinary tract infection) 05/04/2023   COPD (chronic obstructive pulmonary disease) (HCC)    Tibial plateau fracture 05/03/2023   Moderate persistent asthma without complication 03/10/2022   Olecranon bursitis of right elbow 08/23/2021   Leg hematoma, left, initial encounter 05/03/2021   Syncope 05/03/2021   Osteoarthritis of left knee 04/12/2021   Leukocytes in urine 12/13/2019   Urinary frequency 12/13/2019   Closed fracture of metatarsal bone 12/25/2018   Polyp of ascending colon 02/16/2018   Diverticula, colon 02/16/2018   Hypothyroidism 02/16/2018   Essential hypertension, benign 02/16/2018   ASCVD (arteriosclerotic cardiovascular disease) 02/16/2018   Periumbilical abdominal pain 02/16/2018   GI bleed  02/16/2018   Iron deficiency anemia, unspecified 02/16/2018   Chronic liver disease 02/16/2018   Carrier of alpha-1-antitrypsin deficiency 02/16/2018   History of colonic polyps 02/16/2018   Abnormal liver CT 02/16/2018   Elevated liver enzymes 02/16/2018   History of hepatitis C 02/16/2018   Abnormal findings on imaging test 02/16/2018   Helicobacter pylori gastritis 02/16/2018   Gait instability 02/13/2018   Mixed hyperlipidemia 02/13/2018   Atherosclerosis of native coronary artery of native heart with stable angina pectoris 02/12/2018   Centrilobular emphysema (HCC) 02/12/2018   Anemia 02/12/2018   Benign neoplasm of cecum 01/16/2018   Heterozygous alpha 1-antitrypsin deficiency (HCC) 01/16/2018   GERD (gastroesophageal reflux disease) 01/16/2018   Bronchiectasis without complication (HCC) 05/16/2016   Moderate episode of recurrent major depressive disorder (HCC) 04/14/2016   Bloody stool 09/15/2015   Left lower quadrant pain 09/15/2015   Long term (current) use of antithrombotics/antiplatelets 09/15/2015   Dyspnea 05/18/2015   Anxiety 11/14/2011   DJD (degenerative joint disease) 11/14/2011   Abnormal stress test 04/20/2010   Status post coronary artery stent placement 04/20/2010   CAD (coronary artery disease) 04/20/2010    Palliative Care Assessment & Plan   Recommendations/Plan: Patient and daughter want to shift to comfort with hospice at discharge  Code Status:    Code Status Orders  (From admission, onward)  Start     Ordered   11/08/23 1736  Full code  Continuous       Question:  By:  Answer:  Consent: discussion documented in EHR   11/08/23 1743           Code Status History     Date Active Date Inactive Code Status Order ID Comments User Context   05/03/2023 0840 05/11/2023 1834 Full Code [id number]  Laurita Cort DASEN, MD ED      Advance Directive Documentation    Flowsheet Row Most Recent Value  Type of Advance Directive Living will   Pre-existing out of facility DNR order (yellow form or pink MOST form) --  MOST Form in Place? --   Camelia Lewis, NP  Please contact Palliative Medicine Team phone at [phone] for questions and concerns.

## 2023-11-16 DIAGNOSIS — Z7189 Other specified counseling: Secondary | ICD-10-CM | POA: Diagnosis not present

## 2023-11-16 DIAGNOSIS — I2699 Other pulmonary embolism without acute cor pulmonale: Secondary | ICD-10-CM | POA: Diagnosis not present

## 2023-11-16 LAB — CBC
HCT: 24.9 % — ABNORMAL LOW (ref 36.0–46.0)
Hemoglobin: 7.8 g/dL — ABNORMAL LOW (ref 12.0–15.0)
MCH: 27.8 pg (ref 26.0–34.0)
MCHC: 31.3 g/dL (ref 30.0–36.0)
MCV: 88.6 fL (ref 80.0–100.0)
Platelets: 317 K/uL (ref 150–400)
RBC: 2.81 MIL/uL — ABNORMAL LOW (ref 3.87–5.11)
RDW: 18.3 % — ABNORMAL HIGH (ref 11.5–15.5)
WBC: 8.8 K/uL (ref 4.0–10.5)
nRBC: 0 % (ref 0.0–0.2)

## 2023-11-16 LAB — BASIC METABOLIC PANEL WITH GFR
Anion gap: 6 (ref 5–15)
BUN: 38 mg/dL — ABNORMAL HIGH (ref 8–23)
CO2: 25 mmol/L (ref 22–32)
Calcium: 7.7 mg/dL — ABNORMAL LOW (ref 8.9–10.3)
Chloride: 101 mmol/L (ref 98–111)
Creatinine, Ser: 1.04 mg/dL — ABNORMAL HIGH (ref 0.44–1.00)
GFR, Estimated: 50 mL/min — ABNORMAL LOW (ref >60)
Glucose, Bld: 109 mg/dL — ABNORMAL HIGH (ref 70–99)
Potassium: 4.7 mmol/L (ref 3.5–5.1)
Sodium: 132 mmol/L — ABNORMAL LOW (ref 135–145)

## 2023-11-16 NOTE — Plan of Care (Signed)
   Problem: Clinical Measurements: Goal: Respiratory complications will improve Outcome: Progressing   Problem: Activity: Goal: Risk for activity intolerance will decrease Outcome: Progressing

## 2023-11-16 NOTE — TOC Progression Note (Signed)
 Transition of Care [facility]) - Progression Note    Patient Details  Name: Terri Braun MRN: [id number] Date of Birth: [birth year]  [facility]) CM/SW Contact  Lauraine JAYSON Carpen, LCSW Phone Number: 11/16/2023, 10:51 AM  Clinical Narrative:   [facility] admissions coordinator will ask administrator if patient is able to return with hospice.  Expected Discharge Plan and Services                                               Social Drivers of Health (SDOH) Interventions SDOH Screenings   Food Insecurity: No Food Insecurity (11/10/2023)  Housing: Low Risk  (11/10/2023)  Transportation Needs: No Transportation Needs (11/10/2023)  Utilities: Not At Risk (11/10/2023)  Alcohol Screen: Low Risk  (09/29/2021)  Depression (PHQ2-9): Low Risk  (09/29/2021)  Financial Resource Strain: Low Risk  (08/11/2023)   Received from [facility]  Physical Activity: Insufficiently Active (09/29/2021)  Social Connections: Moderately Isolated (11/10/2023)  Stress: No Stress Concern Present (09/29/2021)  Tobacco Use: Low Risk  (11/08/2023)    Readmission Risk Interventions     No data to display

## 2023-11-16 NOTE — Progress Notes (Signed)
 Progress Note   Patient: Terri Braun FMW:[id number] DOB: [birth year] DOA: 11/08/2023     7 DOS: the patient was seen and examined on 11/16/2023   Brief hospital course: Lorretta Kerce is a 89 y.o. female with medical history significant of hypertension, hyperlipidemia, hypothyroidism, GERD, CAD status post stent, depression, anxiety, alpha 1 antitrypsin, COPD, anemia presenting with cough/shortness of breath.  VQ scan confirmed acute bilateral PE. During hospitalization patient's oxygen requirement worsened and chest imaging showed findings concerning for pneumonia for which patient was initiated on antibiotics. Patient was also having cough with swallowing for which speech therapist was consulted, patient has been cleared to have a meal.  Palliative care also consulted for goals of care discussion.    Acute pulmonary embolism Workup outpatient showed elevated D-dimer and VQ scan was ordered which demonstrated bilateral wedge-shaped infarcts, high risk/positive for PE.   Echocardiogram within normal limit of EF Heparin drip transitioned to Eliquis  TOC working on SNF   Community-acquired pneumonia with hypoxia Patient requiring 2 L of oxygen Chest x-ray showing findings of pneumonia S/p ceftriaxone  and azithromycin for 4 days Wean oxygen as able Seen by SLP  Hyponatremia, mild - salt tabs, Monitor sodium   Hyperkalemia-status post Lokelma Monitor potassium closely  Hypertension - Continue home losartan    Hyperlipidemia - Continue home rosuvastatin    GERD - Continue PPI   CAD - Continue home rosuvastatin , Plavix  - Starting anticoagulation as above   Depression Anxiety - Continue home duloxetine  and trazodone   COPD Heterozygous for alpha 1 antitrypsin - Replace home Trelegy with formulary Breztri - Continue as needed albuterol    Normocytic anemia Hb dropped to 6.6, s/p 1u pRBC improved to 7.8 No evidence of GI bleeding Monitor Hb   -- GOC Palliative care  following, patient and daughter decided to go back to facility with hospice Called daughter [name], left voicemail    DVT prophylaxis:      Heparin  Code Status:              Full   Disposition Plan: TOC working on SNF     Subjective:  Lying comfortably in the bed, no distress Tried calling daughter, left voicemail  Physical Exam:   Constitutional:      General: She is not in acute distress.    Appearance: Normal appearance.  Cardiovascular:     Rate and Rhythm: Normal rate and regular rhythm.     Pulses: Normal pulses.     Heart sounds: Normal heart sounds.  Pulmonary:     Effort: Pulmonary effort is normal. No respiratory distress.     Breath sounds: Normal breath sounds.  Abdominal:     General: Bowel sounds are normal. There is no distension.     Palpations: Abdomen is soft.     Tenderness: There is no abdominal tenderness.  Musculoskeletal:        General: No swelling or deformity.  Skin:    General: Skin is warm and dry.  Neurological:     General: No focal deficit present.     Mental Status: Mental status is at baseline.       Data reviewed:    Latest Ref Rng & Units 11/16/2023    6:05 AM 11/15/2023    7:50 AM 11/14/2023    6:40 PM  CBC  WBC 4.0 - 10.5 K/uL 8.8  8.9    Hemoglobin 12.0 - 15.0 g/dL 7.8  7.7  6.6   Hematocrit 36.0 - 46.0 % 24.9  24.5  Platelets 150 - 400 K/uL 317  308         Latest Ref Rng & Units 11/16/2023    6:05 AM 11/15/2023    3:55 AM 11/14/2023    3:55 AM  BMP  Glucose 70 - 99 mg/dL 890  881  885   BUN 8 - 23 mg/dL 38  31  38   Creatinine 0.44 - 1.00 mg/dL 8.95  9.12  8.91   Sodium 135 - 145 mmol/L 132  131  132   Potassium 3.5 - 5.1 mmol/L 4.7  4.1  4.1   Chloride 98 - 111 mmol/L 101  99  100   CO2 22 - 32 mmol/L 25  24  24    Calcium  8.9 - 10.3 mg/dL 7.7  7.6  7.7     Vitals:   11/16/23 0800 11/16/23 1257 11/16/23 1458 11/16/23 1615  BP: (!) 136/54 (!) 123/44  (!) 132/44  Pulse: 87 92  98  Resp: 16  16   Temp:  98.2 F (36.8 C) 98.6 F (37 C)  99.3 F (37.4 C)  TempSrc: Axillary   Axillary  SpO2: 98% 97%  98%  Weight:      Height:         Author: Laree Lock, MD 11/16/2023 6:30 PM  For on call review [email or web address].

## 2023-11-16 NOTE — Progress Notes (Addendum)
 Daily Progress Note   Patient Name: Terri Braun       Date: 11/16/2023 DOB: 1929/11/15  Age: 89 y.o. MRN#: [id number] Attending Physician: Jerelene Critchley, MD Primary Care Physician: Lenon Layman ORN, MD Admit Date: 11/08/2023  Reason for Consultation/Follow-up: Establishing goals of care  Subjective: Notes reviewed.  TOC working on discharge.  Into see patient and family to assess for needs and answer any questions that may have.  She is currently resting in bed at this time; no family at bedside.  Patient is resting comfortably with her eyes closed.  Even and unlabored respirations.  No distress noted.  Spoke with nursing for updates.  Length of Stay: 7  Current Medications: Scheduled Meds:   apixaban  10 mg Oral BID   Followed by   NOREEN ON 11/18/2023] apixaban  5 mg Oral BID   budesonide-glycopyrrolate-formoterol  2 puff Inhalation BID   celecoxib   100 mg Oral Daily   clopidogrel   75 mg Oral Daily   dextromethorphan-guaiFENesin  1 tablet Oral BID   DULoxetine   60 mg Oral Daily   feeding supplement  237 mL Oral BID BM   Influenza vac split trivalent PF  0.5 mL Intramuscular Tomorrow-1000   isosorbide  mononitrate  30 mg Oral Daily   losartan   50 mg Oral Daily   oxymetazoline  1 spray Each Nare BID   pantoprazole   20 mg Oral Daily   sodium chloride  flush  3 mL Intravenous Q12H   traZODone  100 mg Oral QHS    Continuous Infusions:   PRN Meds: acetaminophen  **OR** acetaminophen , albuterol , HYDROcodone -acetaminophen , polyethylene glycol  Physical Exam Constitutional:      Comments: Eyes closed  Pulmonary:     Comments: Even and unlabored respirations            Vital Signs: BP (!) 123/44 (BP Location: Right Arm)   Pulse 92   Temp 98.6 F (37 C)   Resp 16    Ht 5' 5 (1.651 m)   Wt 64.4 kg   SpO2 97%   BMI 23.63 kg/m  SpO2: SpO2: 97 % O2 Device: O2 Device: Nasal Cannula O2 Flow Rate: O2 Flow Rate (L/min): 2 L/min  Intake/output summary:  Intake/Output Summary (Last 24 hours) at 11/16/2023 1608 Last data filed at 11/16/2023 1433 Gross per 24 hour  Intake  960 ml  Output --  Net 960 ml   LBM: Last BM Date : 11/09/23 Baseline Weight: Weight: 64.4 kg Most recent weight: Weight: 64.4 kg   Patient Active Problem List   Diagnosis Date Noted   Goals of care, counseling/discussion 11/15/2023   Acute pulmonary embolism (HCC) 11/08/2023   Frequent falls 05/12/2023   Closed fracture of proximal end of left fibula 05/05/2023   Closed nondisplaced oblique fracture of shaft of left tibia 05/05/2023   UTI (urinary tract infection) 05/04/2023   COPD (chronic obstructive pulmonary disease) (HCC)    Tibial plateau fracture 05/03/2023   Moderate persistent asthma without complication 03/10/2022   Olecranon bursitis of right elbow 08/23/2021   Leg hematoma, left, initial encounter 05/03/2021   Syncope 05/03/2021   Osteoarthritis of left knee 04/12/2021   Leukocytes in urine 12/13/2019   Urinary frequency 12/13/2019   Closed fracture of metatarsal bone 12/25/2018   Polyp of ascending colon 02/16/2018   Diverticula, colon 02/16/2018   Hypothyroidism 02/16/2018   Essential hypertension, benign 02/16/2018   ASCVD (arteriosclerotic cardiovascular disease) 02/16/2018   Periumbilical abdominal pain 02/16/2018   GI bleed 02/16/2018   Iron deficiency anemia, unspecified 02/16/2018   Chronic liver disease 02/16/2018   Carrier of alpha-1-antitrypsin deficiency 02/16/2018   History of colonic polyps 02/16/2018   Abnormal liver CT 02/16/2018   Elevated liver enzymes 02/16/2018   History of hepatitis C 02/16/2018   Abnormal findings on imaging test 02/16/2018   Helicobacter pylori gastritis 02/16/2018   Gait instability 02/13/2018   Mixed  hyperlipidemia 02/13/2018   Atherosclerosis of native coronary artery of native heart with stable angina pectoris 02/12/2018   Centrilobular emphysema (HCC) 02/12/2018   Anemia 02/12/2018   Benign neoplasm of cecum 01/16/2018   Heterozygous alpha 1-antitrypsin deficiency (HCC) 01/16/2018   GERD (gastroesophageal reflux disease) 01/16/2018   Bronchiectasis without complication (HCC) 05/16/2016   Moderate episode of recurrent major depressive disorder (HCC) 04/14/2016   Bloody stool 09/15/2015   Left lower quadrant pain 09/15/2015   Long term (current) use of antithrombotics/antiplatelets 09/15/2015   Dyspnea 05/18/2015   Anxiety 11/14/2011   DJD (degenerative joint disease) 11/14/2011   Abnormal stress test 04/20/2010   Status post coronary artery stent placement 04/20/2010   CAD (coronary artery disease) 04/20/2010    Palliative Care Assessment & Plan     Recommendations/Plan: TOC working on discharge to facility with hospice  Code Status:    Code Status Orders  (From admission, onward)           Start     Ordered   11/15/23 1721  Do not attempt resuscitation (DNR) - Comfort care  (Code Status)  Continuous       Question Answer Comment  If patient has no pulse and is not breathing Do Not Attempt Resuscitation   In Pre-Arrest Conditions (Patient Is Breathing and Has a Pulse) Provide comfort measures. Relieve any mechanical airway obstruction. Avoid transfer unless required for comfort.   Consent: Discussion documented in EHR or advanced directives reviewed      11/15/23 1721           Code Status History     Date Active Date Inactive Code Status Order ID Comments User Context   11/08/2023 1743 11/15/2023 1721 Full Code [id number]  Seena Marsa NOVAK, MD ED   05/03/2023 0840 05/11/2023 1834 Full Code [id number]  Laurita Cort DASEN, MD ED      Advance Directive Documentation    Flowsheet Row Most Recent Value  Type of Advance Directive Living will  Pre-existing out  of facility DNR order (yellow form or pink MOST form) --  MOST Form in Place? --    Camelia Lewis, NP  Please contact Palliative Medicine Team phone at [phone] for questions and concerns.

## 2023-11-16 NOTE — Plan of Care (Signed)

## 2023-11-17 DIAGNOSIS — I2699 Other pulmonary embolism without acute cor pulmonale: Secondary | ICD-10-CM | POA: Diagnosis not present

## 2023-11-17 NOTE — Plan of Care (Signed)

## 2023-11-17 NOTE — Plan of Care (Signed)
  Problem: Clinical Measurements: Goal: Will remain free from infection Outcome: Progressing Goal: Cardiovascular complication will be avoided Outcome: Progressing   Problem: Coping: Goal: Level of anxiety will decrease Outcome: Progressing

## 2023-11-17 NOTE — Progress Notes (Signed)
 Progress Note   Patient: Terri Braun FMW:[id number] DOB: 06-06-1929 DOA: 11/08/2023     8 DOS: the patient was seen and examined on 11/17/2023   Brief hospital course: Blessen Kimbrough is a 89 y.o. female with medical history significant of hypertension, hyperlipidemia, hypothyroidism, GERD, CAD status post stent, depression, anxiety, alpha 1 antitrypsin, COPD, anemia presenting with cough/shortness of breath.  VQ scan confirmed acute bilateral PE. During hospitalization patient's oxygen requirement worsened and chest imaging showed findings concerning for pneumonia for which patient was initiated on antibiotics. Patient was also having cough with swallowing for which speech therapist was consulted, patient has been cleared to have a meal.  Palliative care also consulted for goals of care discussion.    Acute pulmonary embolism Workup outpatient showed elevated D-dimer and VQ scan was ordered which demonstrated bilateral wedge-shaped infarcts, high risk/positive for PE.   Echocardiogram within normal limit of EF Heparin drip transitioned to Eliquis    Community-acquired pneumonia with hypoxia Patient requiring 2 L of oxygen Chest x-ray showing findings of pneumonia S/p ceftriaxone  and azithromycin for 4 days Wean oxygen as able Seen by SLP  Hyponatremia, mild - salt tabs, Monitor sodium   Hyperkalemia-status post Lokelma Monitor potassium closely  Hypertension - Continue home losartan    Hyperlipidemia - Continue home rosuvastatin    GERD - Continue PPI   CAD - Continue home rosuvastatin , Plavix  - Starting anticoagulation as above   Depression Anxiety - Continue home duloxetine  and trazodone   COPD Heterozygous for alpha 1 antitrypsin - Replace home Trelegy with formulary Breztri - Continue as needed albuterol    Normocytic anemia Hb dropped to 6.6, s/p 1u pRBC improved to 7.8 No evidence of GI bleeding Monitor Hb  Sacral pressure  - stage I, present on  adm   -- GOC Palliative care following, patient and daughter decided to go back to facility with hospice/comfort care Called daughter Terri Braun, discussed GOC - no further lab draws    DVT prophylaxis:      Heparin  Code Status:              Full   Disposition Plan: TOC working on SNF     Subjective:  Lying comfortably in the bed, no distress Spoke with daughter Terri Braun over the phone and provided updates  Physical Exam:   Constitutional:      General: She is not in acute distress. Cardiovascular:     Rate and Rhythm: Normal rate and regular rhythm.     Pulses: Normal pulses.     Heart sounds: Normal heart sounds.  Pulmonary:     Effort: Pulmonary effort is normal. No respiratory distress.     Breath sounds: Normal breath sounds.  Abdominal:     General: Bowel sounds are normal. There is no distension.     Palpations: Abdomen is soft.     Tenderness: There is no abdominal tenderness.  Musculoskeletal:        General: No swelling or deformity.  Skin:    General: Skin is warm and dry.  Neurological:     General: No focal deficit present.     Mental Status: Mental status is at baseline.       Data reviewed:    Latest Ref Rng & Units 11/16/2023    6:05 AM 11/15/2023    7:50 AM 11/14/2023    6:40 PM  CBC  WBC 4.0 - 10.5 K/uL 8.8  8.9    Hemoglobin 12.0 - 15.0 g/dL 7.8  7.7  6.6  Hematocrit 36.0 - 46.0 % 24.9  24.5    Platelets 150 - 400 K/uL 317  308         Latest Ref Rng & Units 11/16/2023    6:05 AM 11/15/2023    3:55 AM 11/14/2023    3:55 AM  BMP  Glucose 70 - 99 mg/dL 890  881  885   BUN 8 - 23 mg/dL 38  31  38   Creatinine 0.44 - 1.00 mg/dL 8.95  9.12  8.91   Sodium 135 - 145 mmol/L 132  131  132   Potassium 3.5 - 5.1 mmol/L 4.7  4.1  4.1   Chloride 98 - 111 mmol/L 101  99  100   CO2 22 - 32 mmol/L 25  24  24    Calcium  8.9 - 10.3 mg/dL 7.7  7.6  7.7     Vitals:   11/16/23 2010 11/17/23 0055 11/17/23 0418 11/17/23 0809  BP: (!) 135/44 (!)  119/48 (!) 120/45 (!) 128/44  Pulse: (!) 107 90 85 79  Resp: 20 19 20 20   Temp: 99 F (37.2 C) 98.7 F (37.1 C) 98.4 F (36.9 C) 98.3 F (36.8 C)  TempSrc:    Oral  SpO2: 97% 98% 98% 99%  Weight:      Height:         Author: Laree Lock, MD 11/17/2023 3:49 PM  For on call review [email or web address].

## 2023-11-18 DIAGNOSIS — M79661 Pain in right lower leg: Secondary | ICD-10-CM

## 2023-11-18 DIAGNOSIS — Z515 Encounter for palliative care: Secondary | ICD-10-CM | POA: Diagnosis not present

## 2023-11-18 DIAGNOSIS — Z7189 Other specified counseling: Secondary | ICD-10-CM | POA: Diagnosis not present

## 2023-11-18 DIAGNOSIS — I2699 Other pulmonary embolism without acute cor pulmonale: Secondary | ICD-10-CM | POA: Diagnosis not present

## 2023-11-18 DIAGNOSIS — M79662 Pain in left lower leg: Secondary | ICD-10-CM

## 2023-11-18 NOTE — Progress Notes (Signed)
 Daily Progress Note   Patient Name: Terri Braun       Date: 11/18/2023 DOB: 1929-08-22  Age: 89 y.o. MRN#: [id number] Attending Physician: Jerelene Critchley, MD Primary Care Physician: Lenon Layman ORN, MD Admit Date: 11/08/2023  Reason for Consultation/Follow-up: Establishing goals of care  HPI/Brief Hospital Review: Shalandra Leu is a 89 y.o. female with medical history significant of hypertension, hyperlipidemia, hypothyroidism, GERD, CAD status post stent, depression, anxiety, alpha 1 antitrypsin, COPD, anemia presenting with cough/shortness of breath/PE.   Transitioned to [facility] 11/15/2023  Palliative Medicine consulted for assisting with goals of care conversations.  Subjective: Extensive chart review has been completed prior to meeting patient including labs, vital signs, imaging, progress notes, orders, and available advanced directive documents from current and previous encounters.    Visited with Ms. Behringer at her bedside. She is awake, alert and able to engage in conversation. She reports not feeling well, initially could not offer a specific complaint but was able to explain she was experiencing pain in both legs and overall not feeling well. Reviewed available medications with her and she requests pain medication for leg pain--reported to nursing staff. Ms. Alsteen also complains of not being able to rest at night due to frequent interruptions, falling asleep without difficulty. She reports no change in appetite and was able to tolerate breakfast. No family or visitors at bedside during time of visit.  Review of MAR, no recommendations for adjustments at this time, current regimen managing comfort.  Secure message sent to team for disposition clarification. TOC in contact with  daughter who has [facility] and will be followed by [facility] once transferred to facility. Per TOC, [facility] does not accept admissions over the weekend. Potential for discharge on Monday if authorization approved and pending bed availability.  Attempted to call daughter-Carolyn, call sent to VM. PMT will continue to follow for support.  Objective:  Physical Exam Constitutional:      General: She is not in acute distress.    Appearance: She is ill-appearing.  HENT:     Head: Normocephalic.  Pulmonary:     Effort: Pulmonary effort is normal. No respiratory distress.  Skin:    General: Skin is warm and dry.     Findings: Bruising present.  Neurological:     Mental Status: She  is alert.     Motor: Weakness present.     Comments: Oriented to person and place, disoriented to situation  Psychiatric:        Mood and Affect: Mood normal.        Behavior: Behavior normal.        Thought Content: Thought content normal.             Vital Signs: BP (!) 122/41 (BP Location: Right Arm)   Pulse 91   Temp 98.6 F (37 C) (Oral)   Resp 19   Ht 5' 5 (1.651 m)   Wt 64.4 kg   SpO2 98%   BMI 23.63 kg/m  SpO2: SpO2: 98 % O2 Device: O2 Device: Nasal Cannula O2 Flow Rate: O2 Flow Rate (L/min): 2 L/min   Palliative Care Assessment & Plan   Assessment/Recommendation/Plan  CMO remains Plan to d/c to [facility] with [facility] pending authorization and bed availability   Thank you for allowing the Palliative Medicine Team to assist in the care of this patient.  Visit includes: Detailed review of medical records (labs, imaging, vital signs), medically appropriate exam (mental status, respiratory, cardiac, skin), discussed with treatment team, counseling and educating patient, family and staff, documenting clinical information, medication management and coordination of care.  Waddell Lesches, DNP, AGNP-C Palliative Medicine   Please contact  Palliative Medicine Team phone at [phone] for questions and concerns.

## 2023-11-18 NOTE — Progress Notes (Signed)
 Progress Note   Patient: Terri Braun FMW:[id number] DOB: 1929-05-01 DOA: 11/08/2023     9 DOS: the patient was seen and examined on 11/18/2023   Brief hospital course: Terri Braun is a 89 y.o. female with medical history significant of hypertension, hyperlipidemia, hypothyroidism, GERD, CAD status post stent, depression, anxiety, alpha 1 antitrypsin, COPD, anemia presenting with cough/shortness of breath.  VQ scan confirmed acute bilateral PE. During hospitalization patient's oxygen requirement worsened and chest imaging showed findings concerning for pneumonia for which patient was initiated on antibiotics. Patient was also having cough with swallowing for which speech therapist was consulted, patient has been cleared to have a meal.  Palliative care also consulted for goals of care discussion.    Acute pulmonary embolism Workup outpatient showed elevated D-dimer and VQ scan was ordered which demonstrated bilateral wedge-shaped infarcts, high risk/positive for PE.   Echocardiogram within normal limit of EF Heparin drip transitioned to Eliquis    Community-acquired pneumonia with hypoxia Patient requiring 2 L of oxygen Chest x-ray showing findings of pneumonia S/p ceftriaxone  and azithromycin for 4 days Wean oxygen as able Seen by SLP  Hyponatremia, mild Hyperkalemia-status post Lokelma - No labs  Hypertension - Continue home losartan    Hyperlipidemia - Continue home rosuvastatin    GERD - Continue PPI   CAD - Continue home rosuvastatin , Plavix  - Starting anticoagulation as above   Depression Anxiety - Continue home duloxetine  and trazodone   COPD Heterozygous for alpha 1 antitrypsin - Replace home Trelegy with formulary Breztri - Continue as needed albuterol    Normocytic anemia Hb dropped to 6.6, s/p 1u pRBC improved to 7.8 No evidence of GI bleeding No further labs  Sacral pressure  - stage I, present on adm   -- GOC Palliative care following,  patient and daughter decided to go back to facility with hospice/comfort care Called daughter Terri Braun, discussed GOC - no further lab draws    DVT prophylaxis:      Heparin  Code Status:              Full   Disposition Plan: TOC working on SNF     Subjective:  Lying comfortably in the bed, no distress No complaints, pending placement  Physical Exam:   Constitutional:      General: She is not in acute distress. Cardiovascular:     Rate and Rhythm: Normal rate and regular rhythm.     Pulses: Normal pulses.     Heart sounds: Normal heart sounds.  Pulmonary:     Effort: Pulmonary effort is normal. No respiratory distress.     Breath sounds: Normal breath sounds.  Abdominal:     General: Bowel sounds are normal. There is no distension.     Palpations: Abdomen is soft.     Tenderness: There is no abdominal tenderness.  Musculoskeletal:        General: No swelling or deformity.  Skin:    General: Skin is warm and dry.  Neurological:     General: No focal deficit present.     Mental Status: Mental status is at baseline.       Data reviewed:    Latest Ref Rng & Units 11/16/2023    6:05 AM 11/15/2023    7:50 AM 11/14/2023    6:40 PM  CBC  WBC 4.0 - 10.5 K/uL 8.8  8.9    Hemoglobin 12.0 - 15.0 g/dL 7.8  7.7  6.6   Hematocrit 36.0 - 46.0 % 24.9  24.5  Platelets 150 - 400 K/uL 317  308         Latest Ref Rng & Units 11/16/2023    6:05 AM 11/15/2023    3:55 AM 11/14/2023    3:55 AM  BMP  Glucose 70 - 99 mg/dL 890  881  885   BUN 8 - 23 mg/dL 38  31  38   Creatinine 0.44 - 1.00 mg/dL 8.95  9.12  8.91   Sodium 135 - 145 mmol/L 132  131  132   Potassium 3.5 - 5.1 mmol/L 4.7  4.1  4.1   Chloride 98 - 111 mmol/L 101  99  100   CO2 22 - 32 mmol/L 25  24  24    Calcium  8.9 - 10.3 mg/dL 7.7  7.6  7.7     Vitals:   11/18/23 0436 11/18/23 0758 11/18/23 1115 11/18/23 1511  BP: (!) 122/45 (!) 129/47 (!) 122/41 (!) 129/37  Pulse: 83 84 91 93  Resp: 19     Temp: 98.4 F  (36.9 C) 98.5 F (36.9 C) 98.6 F (37 C) 98.8 F (37.1 C)  TempSrc:  Oral Oral Oral  SpO2: 97% 97% 98% 96%  Weight:      Height:         Author: Laree Lock, MD 11/18/2023 6:14 PM  For on call review [email or web address].

## 2023-11-18 NOTE — TOC Progression Note (Signed)
 Transition of Care [facility]) - Progression Note    Patient Details  Name: Terri Braun MRN: [id number] Date of Birth: 1929-03-06  Transition of Care [facility]) CM/SW Contact  [place], [place] Phone Number: 11/18/2023, 1:32 PM  Clinical Narrative:     CSW spoke with Terri Braun. She advised that patient is not returning to [facility]. She advised that it is too expensive. She advised that her mother is going to [facility]. [facility] has in house hospice services.                     Expected Discharge Plan and Services                                               Social Drivers of Health (SDOH) Interventions SDOH Screenings   Food Insecurity: No Food Insecurity (11/10/2023)  Housing: Low Risk  (11/10/2023)  Transportation Needs: No Transportation Needs (11/10/2023)  Utilities: Not At Risk (11/10/2023)  Alcohol Screen: Low Risk  (09/29/2021)  Depression (PHQ2-9): Low Risk  (09/29/2021)  Financial Resource Strain: Low Risk  (08/11/2023)   Received from [facility]  Physical Activity: Insufficiently Active (09/29/2021)  Social Connections: Moderately Isolated (11/10/2023)  Stress: No Stress Concern Present (09/29/2021)  Tobacco Use: Low Risk  (11/08/2023)    Readmission Risk Interventions     No data to display

## 2023-11-19 DIAGNOSIS — J449 Chronic obstructive pulmonary disease, unspecified: Secondary | ICD-10-CM | POA: Diagnosis not present

## 2023-11-19 DIAGNOSIS — Z515 Encounter for palliative care: Secondary | ICD-10-CM | POA: Diagnosis not present

## 2023-11-19 DIAGNOSIS — M79604 Pain in right leg: Secondary | ICD-10-CM | POA: Diagnosis not present

## 2023-11-19 DIAGNOSIS — I2699 Other pulmonary embolism without acute cor pulmonale: Secondary | ICD-10-CM | POA: Diagnosis not present

## 2023-11-19 DIAGNOSIS — M79605 Pain in left leg: Secondary | ICD-10-CM

## 2023-11-19 NOTE — Progress Notes (Addendum)
 Progress Note   Patient: Terri Braun FMW:[id number] DOB: 1929-10-26 DOA: 11/08/2023     10 DOS: the patient was seen and examined on 11/19/2023   Brief hospital course: Terri Braun is a 89 y.o. female with medical history significant of hypertension, hyperlipidemia, hypothyroidism, GERD, CAD status post stent, depression, anxiety, alpha 1 antitrypsin, COPD, anemia presenting with cough/shortness of breath.  VQ scan confirmed acute bilateral PE. During hospitalization patient's oxygen requirement worsened and chest imaging showed findings concerning for pneumonia for which patient was initiated on antibiotics. Patient was also having cough with swallowing for which speech therapist was consulted, patient has been cleared to have a meal.  Palliative care also consulted for goals of care discussion.    Acute pulmonary embolism - On Eliquis   Community-acquired pneumonia with hypoxia - Requiring 2 L of oxygen as needed - S/p ceftriaxone  and azithromycin for 4 days - Seen by SLP  Hyponatremia, mild Hyperkalemia-status post Lokelma - No labs  Hypertension - Continue home losartan    Hyperlipidemia - Continue home rosuvastatin    GERD - Continue PPI   CAD - Continue home rosuvastatin , Plavix  - Starting anticoagulation as above   Depression Anxiety - Continue home duloxetine  and trazodone   COPD Heterozygous for alpha 1 antitrypsin - Replace home Trelegy with formulary Breztri - Continue as needed albuterol    Normocytic anemia - Hb dropped to 6.6, s/p 1u pRBC improved to 7.8 - No evidence of GI bleeding - No further labs  Sacral pressure  - stage I, present on adm   -- GOC Palliative care following, patient and daughter decided to go back to facility with hospice/comfort care Called daughter Terri Braun, discussed GOC - no further lab draws    DVT prophylaxis:      Heparin  Code Status:              Full   Disposition Plan: TOC working on SNF      Subjective:  Lying comfortably in the bed, no distress No complaints, pending placement. Called daughter Terri Braun, provided updates  Physical Exam:   Constitutional:      General: She is not in acute distress. Cardiovascular:     Rate and Rhythm: Normal rate and regular rhythm.     Pulses: Normal pulses.     Heart sounds: Normal heart sounds.  Pulmonary:     Effort: Pulmonary effort is normal. No respiratory distress.     Breath sounds: Normal breath sounds.  Abdominal:     General: Bowel sounds are normal. There is no distension.     Palpations: Abdomen is soft.     Tenderness: There is no abdominal tenderness.  Musculoskeletal:        General: No swelling or deformity.  Skin:    General: Skin is warm and dry.  Neurological:     General: No focal deficit present.     Mental Status: Mental status is at baseline.       Data reviewed:    Latest Ref Rng & Units 11/16/2023    6:05 AM 11/15/2023    7:50 AM 11/14/2023    6:40 PM  CBC  WBC 4.0 - 10.5 K/uL 8.8  8.9    Hemoglobin 12.0 - 15.0 g/dL 7.8  7.7  6.6   Hematocrit 36.0 - 46.0 % 24.9  24.5    Platelets 150 - 400 K/uL 317  308         Latest Ref Rng & Units 11/16/2023    6:05 AM 11/15/2023  3:55 AM 11/14/2023    3:55 AM  BMP  Glucose 70 - 99 mg/dL 890  881  885   BUN 8 - 23 mg/dL 38  31  38   Creatinine 0.44 - 1.00 mg/dL 8.95  9.12  8.91   Sodium 135 - 145 mmol/L 132  131  132   Potassium 3.5 - 5.1 mmol/L 4.7  4.1  4.1   Chloride 98 - 111 mmol/L 101  99  100   CO2 22 - 32 mmol/L 25  24  24    Calcium  8.9 - 10.3 mg/dL 7.7  7.6  7.7     Vitals:   11/18/23 1959 11/19/23 0840 11/19/23 1147 11/19/23 1628  BP: (!) 127/45 (!) 149/44 (!) 104/37 (!) 142/40  Pulse: 94 96 96 87  Resp: 20 18 18 19   Temp: 99.1 F (37.3 C) 98.9 F (37.2 C) 98.6 F (37 C) 98.5 F (36.9 C)  TempSrc: Oral Oral    SpO2: 96% 91% 98% 94%  Weight:      Height:         Author: Laree Lock, MD 11/19/2023 5:02 PM  For on call  review [email or web address].

## 2023-11-19 NOTE — Plan of Care (Signed)
  Problem: Health Behavior/Discharge Planning: Goal: Ability to manage health-related needs will improve Outcome: Adequate for Discharge   Problem: Clinical Measurements: Goal: Respiratory complications will improve Outcome: Progressing Goal: Cardiovascular complication will be avoided Outcome: Progressing

## 2023-11-19 NOTE — Progress Notes (Signed)
 Daily Progress Note   Patient Name: Terri Braun       Date: 11/19/2023 DOB: 1929/02/06  Age: 89 y.o. MRN#: [id number] Attending Physician: Jerelene Critchley, MD Primary Care Physician: Lenon Layman ORN, MD Admit Date: 11/08/2023  Reason for Consultation/Follow-up: Establishing goals of care  HPI/Brief Hospital Review: Terri Braun is a 89 y.o. female with medical history significant of hypertension, hyperlipidemia, hypothyroidism, GERD, CAD status post stent, depression, anxiety, alpha 1 antitrypsin, COPD, anemia presenting with cough/shortness of breath/PE.    Transitioned to [facility] 11/15/2023   Palliative Medicine consulted for assisting with goals of care conversations.  Subjective: Extensive chart review has been completed prior to meeting patient including labs, vital signs, imaging, progress notes, orders, and available advanced directive documents from current and previous encounters.    Visited with Terri Braun at her bedside. She is resting in bed with eyes closed but acknowledges my presence in room. Breakfast tray at bedside, appears she consumed a few bites of oatmeal only. She reports continued discomfort in bilateral legs, requesting pain medication as she reports it alleviated her pain yesterday, denies further acute complaints.  Spoke with nursing staff regarding complaints of pain.  Anticipate discharge to [facility] early next week with hospice services pending insurance authorization.  PMT will step away from daily visits as goals and plan are in place.  Objective:  Physical Exam Constitutional:      General: She is not in acute distress.    Appearance: She is ill-appearing.  HENT:     Head: Normocephalic.  Pulmonary:     Effort: Pulmonary effort is  normal. No respiratory distress.  Skin:    General: Skin is warm and dry.  Neurological:     Mental Status: She is alert.     Motor: Weakness present.  Psychiatric:        Mood and Affect: Affect is flat.        Speech: Speech is delayed.             Vital Signs: BP (!) 142/40 (BP Location: Right Arm)   Pulse 87   Temp 98.5 F (36.9 C)   Resp 19   Ht 5' 5 (1.651 m)   Wt 64.4 kg   SpO2 94%   BMI 23.63 kg/m  SpO2: SpO2: 94 % O2 Device: O2  Device: Room Air O2 Flow Rate: O2 Flow Rate (L/min): 2 L/min   Palliative Care Assessment & Plan   Assessment/Recommendation/Plan  CMO remains Anticipate d/c to SNF with hospice following early next week  Thank you for allowing the Palliative Medicine Team to assist in the care of this patient.  Visit includes: Detailed review of medical records (labs, imaging, vital signs), medically appropriate exam (mental status, respiratory, cardiac, skin), discussed with treatment team, counseling and educating patient, family and staff, documenting clinical information, medication management and coordination of care.  Waddell Lesches, DNP, AGNP-C Palliative Medicine   Please contact Palliative Medicine Team phone at [phone] for questions and concerns.

## 2023-11-20 DIAGNOSIS — I2699 Other pulmonary embolism without acute cor pulmonale: Secondary | ICD-10-CM | POA: Diagnosis not present

## 2023-11-20 MED ORDER — ISOSORBIDE DINITRATE 10 MG PO TABS
10.0000 mg | ORAL_TABLET | Freq: Three times a day (TID) | ORAL | Status: DC
  Administered 2023-11-20 – 2023-11-28 (×25): 10 mg via ORAL
  Filled 2023-11-20 (×27): qty 1

## 2023-11-20 MED ORDER — OMEPRAZOLE 20 MG PO TBDD
20.0000 mg | DELAYED_RELEASE_TABLET | Freq: Every day | ORAL | Status: DC
  Administered 2023-11-20 – 2023-11-28 (×9): 20 mg via ORAL
  Filled 2023-11-20 (×10): qty 1

## 2023-11-20 NOTE — Progress Notes (Signed)
 Progress Note   Patient: Terri Braun FMW:[id number] DOB: 04/14/1929 DOA: 11/08/2023     11 DOS: the patient was seen and examined on 11/20/2023   Brief hospital course: Terri Braun is a 89 y.o. female with medical history significant of hypertension, hyperlipidemia, hypothyroidism, GERD, CAD status post stent, depression, anxiety, alpha 1 antitrypsin, COPD, anemia presenting with cough/shortness of breath.  VQ scan confirmed acute bilateral PE. During hospitalization patient's oxygen requirement worsened and chest imaging showed findings concerning for pneumonia for which patient was initiated on antibiotics. Patient was also having cough with swallowing for which speech therapist was consulted, patient has been cleared to have a meal.  Palliative care also consulted for goals of care discussion.    Acute pulmonary embolism - On Eliquis   Community-acquired pneumonia with hypoxia - Requiring 2 L of oxygen as needed - S/p ceftriaxone  and azithromycin for 4 days - Seen by SLP  Hyponatremia, mild Hyperkalemia-status post Lokelma - No labs  Hypertension - Continue home losartan , Imdur  changed to Isordil  as meds needs to be crushed   Hyperlipidemia - Continue home rosuvastatin    GERD - Continue PPI   CAD - Continue home rosuvastatin , Plavix  - Starting anticoagulation as above   Depression Anxiety - Continue home duloxetine  and trazodone   COPD Heterozygous for alpha 1 antitrypsin - Replace home Trelegy with formulary Breztri - Continue as needed albuterol    Normocytic anemia - Hb dropped to 6.6, s/p 1u pRBC improved to 7.8 - No evidence of GI bleeding - No further labs  Sacral pressure  - stage I, present on adm   -- GOC Palliative care following, patient and daughter decided to go back to facility with hospice/comfort care Called daughter [name], discussed GOC - no further lab draws    DVT prophylaxis:      Heparin  Code Status:               Full   Disposition Plan: TOC working on SNF     Subjective:  Lying comfortably in the bed, no distress No complaints, pending placement  Physical Exam:   Constitutional:      General: She is not in acute distress. Cardiovascular:     Rate and Rhythm: Normal rate and regular rhythm.     Pulses: Normal pulses.     Heart sounds: Normal heart sounds.  Pulmonary:     Effort: Pulmonary effort is normal. No respiratory distress.     Breath sounds: Normal breath sounds.  Abdominal:     General: Bowel sounds are normal. There is no distension.     Palpations: Abdomen is soft.     Tenderness: There is no abdominal tenderness.  Musculoskeletal:        General: No swelling or deformity.  Skin:    General: Skin is warm and dry.  Neurological:     General: No focal deficit present.     Mental Status: Mental status is at baseline.       Data reviewed:    Latest Ref Rng & Units 11/16/2023    6:05 AM 11/15/2023    7:50 AM 11/14/2023    6:40 PM  CBC  WBC 4.0 - 10.5 K/uL 8.8  8.9    Hemoglobin 12.0 - 15.0 g/dL 7.8  7.7  6.6   Hematocrit 36.0 - 46.0 % 24.9  24.5    Platelets 150 - 400 K/uL 317  308         Latest Ref Rng & Units 11/16/2023  6:05 AM 11/15/2023    3:55 AM 11/14/2023    3:55 AM  BMP  Glucose 70 - 99 mg/dL 890  881  885   BUN 8 - 23 mg/dL 38  31  38   Creatinine 0.44 - 1.00 mg/dL 8.95  9.12  8.91   Sodium 135 - 145 mmol/L 132  131  132   Potassium 3.5 - 5.1 mmol/L 4.7  4.1  4.1   Chloride 98 - 111 mmol/L 101  99  100   CO2 22 - 32 mmol/L 25  24  24    Calcium  8.9 - 10.3 mg/dL 7.7  7.6  7.7     Vitals:   11/19/23 2320 11/20/23 0407 11/20/23 0809 11/20/23 0809  BP: (!) 136/41 (!) 139/45  (!) 147/51  Pulse: 85 91  85  Resp: 20 19  19   Temp: 98.7 F (37.1 C) 99.4 F (37.4 C) 98.2 F (36.8 C)   TempSrc: Oral Oral Axillary   SpO2: 98% 94%  97%  Weight:      Height:         Author: Laree Lock, MD 11/20/2023 4:16 PM  For on call review  [email or web address].

## 2023-11-20 NOTE — Progress Notes (Signed)
 Patient requires medications to be crushed in order to have them administered. Informed attending Ponnala, Shruthi that patient was unable to take the imdur , protonix , cymbalta , and mucinex DM this morning as these medications cannot be crushed per order. Requested attending place new orders for these medications. Also informed pharmacy. Attending voiced understanding and no new orders were placed.

## 2023-11-20 NOTE — Plan of Care (Signed)

## 2023-11-21 DIAGNOSIS — I2699 Other pulmonary embolism without acute cor pulmonale: Secondary | ICD-10-CM | POA: Diagnosis not present

## 2023-11-21 DIAGNOSIS — Z7189 Other specified counseling: Secondary | ICD-10-CM | POA: Diagnosis not present

## 2023-11-21 MED ORDER — DEXTROMETHORPHAN POLISTIREX ER 30 MG/5ML PO SUER
30.0000 mg | Freq: Two times a day (BID) | ORAL | Status: DC
  Filled 2023-11-21: qty 5

## 2023-11-21 MED ORDER — DEXTROMETHORPHAN POLISTIREX ER 30 MG/5ML PO SUER
30.0000 mg | Freq: Two times a day (BID) | ORAL | Status: DC
  Administered 2023-11-21 – 2023-11-24 (×6): 30 mg via ORAL
  Filled 2023-11-21 (×6): qty 5

## 2023-11-21 MED ORDER — GUAIFENESIN 100 MG/5ML PO LIQD
15.0000 mL | Freq: Four times a day (QID) | ORAL | Status: DC
  Administered 2023-11-21 – 2023-11-23 (×9): 15 mL via ORAL
  Filled 2023-11-21: qty 20
  Filled 2023-11-21: qty 15
  Filled 2023-11-21 (×9): qty 20

## 2023-11-21 MED ORDER — GUAIFENESIN 100 MG/5ML PO LIQD: 15.0000 mL | Freq: Four times a day (QID) | ORAL | Status: DC

## 2023-11-21 NOTE — Progress Notes (Signed)
 Daily Progress Note   Patient Name: Terri Braun       Date: 11/21/2023 DOB: 1930-01-19  Age: 89 y.o. MRN#: [id number] Attending Physician: Jerelene Critchley, MD Primary Care Physician: Lenon Layman ORN, MD Admit Date: 11/08/2023  Reason for Consultation/Follow-up: Establishing goals of care  Subjective: Notes reviewed.  In to see patient.  She is currently sitting in bed at this time with breakfast in front of her. It has barely been touched, with perhaps a couple of bites missing.  P.o. intake has remained poor.  She denies complaint at this time.  She states she was happy to be able to visit with her family over the weekend.  She states she is hopeful for placement soon.  No family at bedside.  PMT will shadow peripherally for needs.  Length of Stay: 12  Current Medications: Scheduled Meds:   apixaban  5 mg Oral BID   budesonide-glycopyrrolate-formoterol  2 puff Inhalation BID   celecoxib   100 mg Oral Daily   clopidogrel   75 mg Oral Daily   guaiFENesin  15 mL Oral Q6H   And   dextromethorphan  30 mg Oral Q12H   DULoxetine   60 mg Oral Daily   feeding supplement  237 mL Oral BID BM   isosorbide  dinitrate  10 mg Oral TID   losartan   50 mg Oral Daily   omeprazole  20 mg Oral Daily   sodium chloride  flush  3 mL Intravenous Q12H   traZODone  100 mg Oral QHS    Continuous Infusions:   PRN Meds: acetaminophen  **OR** acetaminophen , albuterol , HYDROcodone -acetaminophen , polyethylene glycol  Physical Exam          Vital Signs: BP (!) 136/51 (BP Location: Right Arm)   Pulse 86   Temp 98.4 F (36.9 C) (Axillary)   Resp 18   Ht 5' 5 (1.651 m)   Wt 64.4 kg   SpO2 98%   BMI 23.63 kg/m  SpO2: SpO2: 98 % O2 Device: O2 Device: Nasal Cannula O2 Flow Rate: O2 Flow Rate  (L/min): 2 L/min  Intake/output summary:  Intake/Output Summary (Last 24 hours) at 11/21/2023 1238 Last data filed at 11/21/2023 0927 Gross per 24 hour  Intake 503 ml  Output 500 ml  Net 3 ml   LBM: Last BM Date : 11/18/23 Baseline Weight: Weight: 64.4  kg Most recent weight: Weight: 64.4 kg   Patient Active Problem List   Diagnosis Date Noted   Goals of care, counseling/discussion 11/15/2023   Acute pulmonary embolism (HCC) 11/08/2023   Frequent falls 05/12/2023   Closed fracture of proximal end of left fibula 05/05/2023   Closed nondisplaced oblique fracture of shaft of left tibia 05/05/2023   UTI (urinary tract infection) 05/04/2023   COPD (chronic obstructive pulmonary disease) (HCC)    Tibial plateau fracture 05/03/2023   Moderate persistent asthma without complication 03/10/2022   Olecranon bursitis of right elbow 08/23/2021   Leg hematoma, left, initial encounter 05/03/2021   Syncope 05/03/2021   Osteoarthritis of left knee 04/12/2021   Leukocytes in urine 12/13/2019   Urinary frequency 12/13/2019   Closed fracture of metatarsal bone 12/25/2018   Polyp of ascending colon 02/16/2018   Diverticula, colon 02/16/2018   Hypothyroidism 02/16/2018   Essential hypertension, benign 02/16/2018   ASCVD (arteriosclerotic cardiovascular disease) 02/16/2018   Periumbilical abdominal pain 02/16/2018   GI bleed 02/16/2018   Iron deficiency anemia, unspecified 02/16/2018   Chronic liver disease 02/16/2018   Carrier of alpha-1-antitrypsin deficiency 02/16/2018   History of colonic polyps 02/16/2018   Abnormal liver CT 02/16/2018   Elevated liver enzymes 02/16/2018   History of hepatitis C 02/16/2018   Abnormal findings on imaging test 02/16/2018   Helicobacter pylori gastritis 02/16/2018   Gait instability 02/13/2018   Mixed hyperlipidemia 02/13/2018   Atherosclerosis of native coronary artery of native heart with stable angina pectoris 02/12/2018   Centrilobular emphysema  (HCC) 02/12/2018   Anemia 02/12/2018   Benign neoplasm of cecum 01/16/2018   Heterozygous alpha 1-antitrypsin deficiency (HCC) 01/16/2018   GERD (gastroesophageal reflux disease) 01/16/2018   Bronchiectasis without complication (HCC) 05/16/2016   Moderate episode of recurrent major depressive disorder (HCC) 04/14/2016   Bloody stool 09/15/2015   Left lower quadrant pain 09/15/2015   Long term (current) use of antithrombotics/antiplatelets 09/15/2015   Dyspnea 05/18/2015   Anxiety 11/14/2011   DJD (degenerative joint disease) 11/14/2011   Abnormal stress test 04/20/2010   Status post coronary artery stent placement 04/20/2010   CAD (coronary artery disease) 04/20/2010    Palliative Care Assessment & Plan     Recommendations/Plan: PMT will shadow  Code Status:    Code Status Orders  (From admission, onward)           Start     Ordered   11/15/23 1721  Do not attempt resuscitation (DNR) - Comfort care  (Code Status)  Continuous       Question Answer Comment  If patient has no pulse and is not breathing Do Not Attempt Resuscitation   In Pre-Arrest Conditions (Patient Is Breathing and Has a Pulse) Provide comfort measures. Relieve any mechanical airway obstruction. Avoid transfer unless required for comfort.   Consent: Discussion documented in EHR or advanced directives reviewed      11/15/23 1721           Code Status History     Date Active Date Inactive Code Status Order ID Comments User Context   11/08/2023 1743 11/15/2023 1721 Full Code [id number]  Seena Marsa NOVAK, MD ED   05/03/2023 0840 05/11/2023 1834 Full Code [id number]  Laurita Cort DASEN, MD ED      Advance Directive Documentation    Flowsheet Row Most Recent Value  Type of Advance Directive Living will  Pre-existing out of facility DNR order (yellow form or pink MOST form) --  MOST Form in Place? --  Prognosis:  < 6 months   Camelia Lewis, NP  Please contact Palliative Medicine Team phone  at [phone] for questions and concerns.

## 2023-11-21 NOTE — Plan of Care (Signed)

## 2023-11-21 NOTE — TOC Progression Note (Addendum)
 Transition of Care [facility]) - Progression Note    Patient Details  Name: Terri Braun MRN: [id number] Date of Birth: 06-13-1929  Transition of Care [facility]) CM/SW Contact  Lauraine JAYSON Carpen, LCSW Phone Number: 11/21/2023, 10:58 AM  Clinical Narrative:  CSW spoke to daughter over the phone. [facility] can only accept patient for short-term rehab if insurance will approve. They do not have any LTC bed availability at this time. Daughter said she spoke to the admissions coordinator on Friday about transitioning to LTC after rehab. CSW contacted the admissions coordinator again but she said daughter would still have to look for LTC placement since they don't have beds right now. Daughter inquired about the hospice home but patient does not meet criteria at this time per palliative NP. CSW sent text message to daughter to notify.  4:21 pm: Asked MD to reconsult PT and OT to see if patient will qualify for rehab at SNF. Daughter is aware.  Expected Discharge Plan and Services                                               Social Drivers of Health (SDOH) Interventions SDOH Screenings   Food Insecurity: No Food Insecurity (11/10/2023)  Housing: Low Risk  (11/10/2023)  Transportation Needs: No Transportation Needs (11/10/2023)  Utilities: Not At Risk (11/10/2023)  Alcohol Screen: Low Risk  (09/29/2021)  Depression (PHQ2-9): Low Risk  (09/29/2021)  Financial Resource Strain: Low Risk  (08/11/2023)   Received from [facility]  Physical Activity: Insufficiently Active (09/29/2021)  Social Connections: Moderately Isolated (11/10/2023)  Stress: No Stress Concern Present (09/29/2021)  Tobacco Use: Low Risk  (11/08/2023)    Readmission Risk Interventions     No data to display

## 2023-11-21 NOTE — Progress Notes (Addendum)
 Progress Note   Patient: Terri Braun FMW:[id number] DOB: 1929/09/10 DOA: 11/08/2023     12 DOS: the patient was seen and examined on 11/21/2023   Brief hospital course: Promiss Labarbera is a 89 y.o. female with medical history significant of hypertension, hyperlipidemia, hypothyroidism, GERD, CAD status post stent, depression, anxiety, alpha 1 antitrypsin, COPD, anemia presenting with cough/shortness of breath.  VQ scan confirmed acute bilateral PE. During hospitalization patient's oxygen requirement worsened and chest imaging showed findings concerning for pneumonia for which patient was initiated on antibiotics. Patient was also having cough with swallowing for which speech therapist was consulted, patient has been cleared to have a meal.  Palliative care also consulted for goals of care discussion.    Acute pulmonary embolism - On Eliquis   Community-acquired pneumonia with hypoxia - Requiring 2 L of oxygen as needed - S/p ceftriaxone  and azithromycin for 4 days - Seen by SLP  Hyponatremia, mild Hyperkalemia-status post Lokelma - No labs  Hypertension - Continue home losartan , Imdur  changed to Isordil  as meds needs to be crushed   Hyperlipidemia - Continue home rosuvastatin    GERD - Continue PPI   CAD - Continue home rosuvastatin , Plavix  - Starting anticoagulation as above   Depression Anxiety - Continue home duloxetine  and trazodone   COPD Heterozygous for alpha 1 antitrypsin - Replace home Trelegy with formulary Breztri - Continue as needed albuterol    Normocytic anemia - Hb dropped to 6.6, s/p 1u pRBC improved to 7.8 - No evidence of GI bleeding - No further labs  Sacral pressure  - stage I, present on adm   -- GOC Palliative care following, patient and daughter decided to go back to facility with hospice/comfort care No abx, blood transfusion, IV fluids, labs Called daughter [name], discussed GOC - no further lab draws    DVT prophylaxis:       Heparin  Code Status:              Full   Disposition Plan: TOC working on SNF     Subjective:  Lying comfortably in the bed, no distress No complaints, pending placement - SNF vs LTC with hospice Called daughter  left Voicemail  Physical Exam:   Constitutional:      General: She is not in acute distress. Cardiovascular:     Rate and Rhythm: Normal rate and regular rhythm.     Pulses: Normal pulses.     Heart sounds: Normal heart sounds.  Pulmonary:     Effort: Pulmonary effort is normal. No respiratory distress.     Breath sounds: Normal breath sounds.  Abdominal:     General: Bowel sounds are normal. There is no distension.     Palpations: Abdomen is soft.     Tenderness: There is no abdominal tenderness.  Musculoskeletal:        General: No swelling or deformity.  Skin:    General: Skin is warm and dry.  Neurological:     General: No focal deficit present.     Mental Status: Mental status is at baseline.       Data reviewed:    Latest Ref Rng & Units 11/16/2023    6:05 AM 11/15/2023    7:50 AM 11/14/2023    6:40 PM  CBC  WBC 4.0 - 10.5 K/uL 8.8  8.9    Hemoglobin 12.0 - 15.0 g/dL 7.8  7.7  6.6   Hematocrit 36.0 - 46.0 % 24.9  24.5    Platelets 150 - 400 K/uL  317  308         Latest Ref Rng & Units 11/16/2023    6:05 AM 11/15/2023    3:55 AM 11/14/2023    3:55 AM  BMP  Glucose 70 - 99 mg/dL 890  881  885   BUN 8 - 23 mg/dL 38  31  38   Creatinine 0.44 - 1.00 mg/dL 8.95  9.12  8.91   Sodium 135 - 145 mmol/L 132  131  132   Potassium 3.5 - 5.1 mmol/L 4.7  4.1  4.1   Chloride 98 - 111 mmol/L 101  99  100   CO2 22 - 32 mmol/L 25  24  24    Calcium  8.9 - 10.3 mg/dL 7.7  7.6  7.7     Vitals:   11/20/23 2300 11/21/23 0500 11/21/23 0921 11/21/23 0922  BP: (!) 111/44 (!) 130/46  (!) 136/51  Pulse: 88 95  86  Resp: 17 18  18   Temp: 98.7 F (37.1 C) 99.1 F (37.3 C) 98.4 F (36.9 C)   TempSrc:   Axillary   SpO2: 97% 94%  98%  Weight:      Height:          Author: Laree Lock, MD 11/21/2023 4:35 PM  For on call review [email or web address].

## 2023-11-22 DIAGNOSIS — I2699 Other pulmonary embolism without acute cor pulmonale: Secondary | ICD-10-CM | POA: Diagnosis not present

## 2023-11-22 DIAGNOSIS — Z7189 Other specified counseling: Secondary | ICD-10-CM | POA: Diagnosis not present

## 2023-11-22 MED ORDER — ORAL CARE MOUTH RINSE: 15.0000 mL | OROMUCOSAL | Status: DC | PRN

## 2023-11-22 MED ORDER — LORAZEPAM 0.5 MG PO TABS: 0.2500 mg | ORAL_TABLET | ORAL | Status: DC | PRN

## 2023-11-22 MED ORDER — ORAL CARE MOUTH RINSE
15.0000 mL | OROMUCOSAL | Status: DC
  Administered 2023-11-22 – 2023-11-28 (×19): 15 mL via OROMUCOSAL

## 2023-11-22 NOTE — TOC Progression Note (Signed)
 Transition of Care [facility]) - Progression Note    Patient Details  Name: Terri Braun MRN: [id number] Date of Birth: 01/30/1930  Transition of Care [facility]) CM/SW Contact  Lauraine JAYSON Carpen, LCSW Phone Number: 11/22/2023, 3:32 PM  Clinical Narrative: CSW left daughter a voicemail. Will discuss LTC placement when she calls back.    Expected Discharge Plan and Services                                               Social Drivers of Health (SDOH) Interventions SDOH Screenings   Food Insecurity: No Food Insecurity (11/10/2023)  Housing: Low Risk  (11/10/2023)  Transportation Needs: No Transportation Needs (11/10/2023)  Utilities: Not At Risk (11/10/2023)  Alcohol Screen: Low Risk  (09/29/2021)  Depression (PHQ2-9): Low Risk  (09/29/2021)  Financial Resource Strain: Low Risk  (08/11/2023)   Received from [facility]  Physical Activity: Insufficiently Active (09/29/2021)  Social Connections: Moderately Isolated (11/10/2023)  Stress: No Stress Concern Present (09/29/2021)  Tobacco Use: Low Risk  (11/08/2023)    Readmission Risk Interventions     No data to display

## 2023-11-22 NOTE — Progress Notes (Addendum)
 [facility] Room [address] [facility] Liaison Note  Received request to meet with daughter to explain hospice services at home, LTC and IPU.  Met with daughter, [name] Foots, and provided extensive education for hospice services.  She reported that  she could not take care of patient at home with Hospice. She understands that patient likely does not qualify for SNF rehab. If so, she is open to a LTC bed search with hospice following at the LTC facility. She understands that Medicare does not pay for LTC. At the present moment, Hospice Home does not have a bed and patient is not IPU appropriate. HL will  follow patient's condition for any changes that may warrant patient being transferred to the Hospice Home. HL  will also keep an eye on bed availability.     [facility] information and contact numbers given to daughrer & above information shared with TOC.   Please call with any questions/concerns.    Thank you for the opportunity to participate in this patient's care.  [facility] Liaison [phone]

## 2023-11-22 NOTE — Progress Notes (Signed)
 Progress Note   Patient: Terri Braun FMW:[id number] DOB: May 07, 1929 DOA: 11/08/2023     13 DOS: the patient was seen and examined on 11/22/2023   Brief hospital course: Terri Braun is a 89 y.o. female with medical history significant of hypertension, hyperlipidemia, hypothyroidism, GERD, CAD status post stent, depression, anxiety, alpha 1 antitrypsin, COPD, anemia presenting with cough/shortness of breath.  VQ scan confirmed acute bilateral PE. During hospitalization patient's oxygen requirement worsened and chest imaging showed findings concerning for pneumonia for which patient was initiated on antibiotics. Patient was also having cough with swallowing for which speech therapist was consulted, patient has been cleared to have a meal.  Palliative care also consulted for goals of care discussion.    Acute pulmonary embolism - On Eliquis   Community-acquired pneumonia with hypoxia - Requiring 2 L of oxygen as needed - S/p ceftriaxone  and azithromycin for 4 days - Seen by SLP  Hyponatremia, mild Hyperkalemia-status post Lokelma - No labs  Hypertension - Continue home losartan , Imdur  changed to Isordil  as meds needs to be crushed   Hyperlipidemia - Continue home rosuvastatin    GERD - Continue PPI   CAD - Continue home rosuvastatin , Plavix  - Starting anticoagulation as above   Depression Anxiety - Continue home duloxetine  and trazodone   COPD Heterozygous for alpha 1 antitrypsin - Replace home Trelegy with formulary Breztri - Continue as needed albuterol    Normocytic anemia - Hb dropped to 6.6, s/p 1u pRBC improved to 7.8 - No evidence of GI bleeding - No further labs  Sacral pressure  - stage I, present on adm   -- GOC Palliative care following, patient and daughter would like her to go to long-term care facility as she likely does not qualify for SNF No abx, blood transfusion, IV fluids, labs Called daughter Terri Braun, discussed GOC - no further lab  draws    DVT prophylaxis:      Heparin  Code Status:              Full   Disposition Plan: TOC working on alternate care facility with hospice     Subjective:  Sitting up comfortably in bed has no current complaints.  Physical Exam:   Physical Exam  Constitutional: In no distress. Chronically ill appearing Cardiovascular: Normal rate, regular rhythm. No lower extremity edema  Pulmonary: Non labored breathing on [place], no wheezing or rales.   Abdominal: Soft. Non distended and non tender Neurological: Alert and oriented to person, place, and time.  Skin: Skin is warm and dry.         Data reviewed:    Latest Ref Rng & Units 11/16/2023    6:05 AM 11/15/2023    7:50 AM 11/14/2023    6:40 PM  CBC  WBC 4.0 - 10.5 K/uL 8.8  8.9    Hemoglobin 12.0 - 15.0 g/dL 7.8  7.7  6.6   Hematocrit 36.0 - 46.0 % 24.9  24.5    Platelets 150 - 400 K/uL 317  308         Latest Ref Rng & Units 11/16/2023    6:05 AM 11/15/2023    3:55 AM 11/14/2023    3:55 AM  BMP  Glucose 70 - 99 mg/dL 890  881  885   BUN 8 - 23 mg/dL 38  31  38   Creatinine 0.44 - 1.00 mg/dL 8.95  9.12  8.91   Sodium 135 - 145 mmol/L 132  131  132   Potassium 3.5 - 5.1  mmol/L 4.7  4.1  4.1   Chloride 98 - 111 mmol/L 101  99  100   CO2 22 - 32 mmol/L 25  24  24    Calcium  8.9 - 10.3 mg/dL 7.7  7.6  7.7     Vitals:   11/21/23 2322 11/21/23 2338 11/22/23 0415 11/22/23 0910  BP: (!) 111/40 (!) 118/45 (!) 108/41 (!) 140/54  Pulse: 85  79 86  Resp: 17  17 18   Temp: 98.2 F (36.8 C)  (!) 97.2 F (36.2 C) 98.6 F (37 C)  TempSrc:      SpO2: 97%  100% 97%  Weight:      Height:         Author: Alban Pepper, MD 11/22/2023 4:59 PM  For on call 7p-7a review [email or web address].

## 2023-11-22 NOTE — Plan of Care (Signed)
  Problem: Clinical Measurements: Goal: Respiratory complications will improve Outcome: Progressing   Problem: Activity: Goal: Risk for activity intolerance will decrease Outcome: Not Progressing   Problem: Nutrition: Goal: Adequate nutrition will be maintained Outcome: Progressing   Problem: Pain Managment: Goal: General experience of comfort will improve and/or be controlled Outcome: Progressing   Problem: Safety: Goal: Ability to remain free from injury will improve Outcome: Progressing

## 2023-11-22 NOTE — Progress Notes (Addendum)
 Daily Progress Note   Patient Name: Terri Braun       Date: 11/22/2023 DOB: 03/29/1929  Age: 89 y.o. MRN#: [id number] Attending Physician: Franchot Novel, MD Primary Care Physician: Lenon Layman ORN, MD Admit Date: 11/08/2023  Reason for Consultation/Follow-up: Establishing goals of care  Subjective: Notes reviewed. Ongoing epic chat regarding staff plans moving forward.  In to see patient.  She is currently sitting in bed at this time.  She appears more restless today.  There appears to be remnants of dried blood around her nostrils and down the sides of her mouth.  She states she has pain in her legs today.    Case discussed with team via epic chat. Length of Stay: 13  Current Medications: Scheduled Meds:   apixaban  5 mg Oral BID   budesonide-glycopyrrolate-formoterol  2 puff Inhalation BID   celecoxib   100 mg Oral Daily   clopidogrel   75 mg Oral Daily   guaiFENesin  15 mL Oral Q6H   And   dextromethorphan  30 mg Oral Q12H   DULoxetine   60 mg Oral Daily   feeding supplement  237 mL Oral BID BM   isosorbide  dinitrate  10 mg Oral TID   losartan   50 mg Oral Daily   omeprazole  20 mg Oral Daily   mouth rinse  15 mL Mouth Rinse 4 times per day   sodium chloride  flush  3 mL Intravenous Q12H   traZODone  100 mg Oral QHS    Continuous Infusions:   PRN Meds: acetaminophen  **OR** acetaminophen , albuterol , HYDROcodone -acetaminophen , mouth rinse, polyethylene glycol  Physical Exam Pulmonary:     Effort: Pulmonary effort is normal.  Skin:    General: Skin is warm and dry.  Neurological:     Mental Status: She is alert.             Vital Signs: BP (!) 140/54 (BP Location: Right Arm)   Pulse 86   Temp 98.6 F (37 C)   Resp 18   Ht 5' 5 (1.651 m)   Wt 64.4 kg    SpO2 97%   BMI 23.63 kg/m  SpO2: SpO2: 97 % O2 Device: O2 Device: Nasal Cannula O2 Flow Rate: O2 Flow Rate (L/min): 2 L/min  Intake/output summary:  Intake/Output Summary (Last 24 hours) at 11/22/2023 1200 Last data  filed at 11/22/2023 1020 Gross per 24 hour  Intake 360 ml  Output 1000 ml  Net -640 ml   LBM: Last BM Date : 11/18/23 Baseline Weight: Weight: 64.4 kg Most recent weight: Weight: 64.4 kg  Patient Active Problem List   Diagnosis Date Noted   Goals of care, counseling/discussion 11/15/2023   Acute pulmonary embolism (HCC) 11/08/2023   Frequent falls 05/12/2023   Closed fracture of proximal end of left fibula 05/05/2023   Closed nondisplaced oblique fracture of shaft of left tibia 05/05/2023   UTI (urinary tract infection) 05/04/2023   COPD (chronic obstructive pulmonary disease) (HCC)    Tibial plateau fracture 05/03/2023   Moderate persistent asthma without complication 03/10/2022   Olecranon bursitis of right elbow 08/23/2021   Leg hematoma, left, initial encounter 05/03/2021   Syncope 05/03/2021   Osteoarthritis of left knee 04/12/2021   Leukocytes in urine 12/13/2019   Urinary frequency 12/13/2019   Closed fracture of metatarsal bone 12/25/2018   Polyp of ascending colon 02/16/2018   Diverticula, colon 02/16/2018   Hypothyroidism 02/16/2018   Essential hypertension, benign 02/16/2018   ASCVD (arteriosclerotic cardiovascular disease) 02/16/2018   Periumbilical abdominal pain 02/16/2018   GI bleed 02/16/2018   Iron deficiency anemia, unspecified 02/16/2018   Chronic liver disease 02/16/2018   Carrier of alpha-1-antitrypsin deficiency 02/16/2018   History of colonic polyps 02/16/2018   Abnormal liver CT 02/16/2018   Elevated liver enzymes 02/16/2018   History of hepatitis C 02/16/2018   Abnormal findings on imaging test 02/16/2018   Helicobacter pylori gastritis 02/16/2018   Gait instability 02/13/2018   Mixed hyperlipidemia 02/13/2018    Atherosclerosis of native coronary artery of native heart with stable angina pectoris 02/12/2018   Centrilobular emphysema (HCC) 02/12/2018   Anemia 02/12/2018   Benign neoplasm of cecum 01/16/2018   Heterozygous alpha 1-antitrypsin deficiency (HCC) 01/16/2018   GERD (gastroesophageal reflux disease) 01/16/2018   Bronchiectasis without complication (HCC) 05/16/2016   Moderate episode of recurrent major depressive disorder (HCC) 04/14/2016   Bloody stool 09/15/2015   Left lower quadrant pain 09/15/2015   Long term (current) use of antithrombotics/antiplatelets 09/15/2015   Dyspnea 05/18/2015   Anxiety 11/14/2011   DJD (degenerative joint disease) 11/14/2011   Abnormal stress test 04/20/2010   Status post coronary artery stent placement 04/20/2010   CAD (coronary artery disease) 04/20/2010    Palliative Care Assessment & Plan    Recommendations/Plan: PO PRN Ativan added for anxiety. Norco available for pain Patient being evaluated by door care for possible hospice facility placement  Code Status:    Code Status Orders  (From admission, onward)           Start     Ordered   11/15/23 1721  Do not attempt resuscitation (DNR) - Comfort care  (Code Status)  Continuous       Question Answer Comment  If patient has no pulse and is not breathing Do Not Attempt Resuscitation   In Pre-Arrest Conditions (Patient Is Breathing and Has a Pulse) Provide comfort measures. Relieve any mechanical airway obstruction. Avoid transfer unless required for comfort.   Consent: Discussion documented in EHR or advanced directives reviewed      11/15/23 1721           Code Status History     Date Active Date Inactive Code Status Order ID Comments User Context   11/08/2023 1743 11/15/2023 1721 Full Code [id number]  Seena Marsa NOVAK, MD ED   05/03/2023 0840 05/11/2023 1834 Full Code [id number]  Laurita Cort DASEN, MD ED      Advance Directive Documentation    Flowsheet Row Most Recent Value   Type of Advance Directive Living will  Pre-existing out of facility DNR order (yellow form or pink MOST form) --  MOST Form in Place? --   Camelia Lewis, NP  Please contact Palliative Medicine Team phone at [phone] for questions and concerns.

## 2023-11-22 NOTE — Progress Notes (Addendum)
 OT Cancellation Note  Patient Details Name: Terri Braun MRN: [id number] DOB: Dec 30, 1929   Cancelled Treatment:    Reason Eval/Treat Not Completed: Patient declined, no reason specified. Pt received in bed, eating breakfast. Pt refuses to participate in OT evaluation and states she does not want to have therapy. Reports BLE pain. Secure chat to RN and care team to update. Will check back at later date/time.   Rene Sizelove L. Cheryel Kyte, OTR/L  11/22/23, 10:07 AM

## 2023-11-22 NOTE — Progress Notes (Signed)
   11/22/23 1510  Spiritual Encounters  Type of Visit Initial  Care provided to: Patient  Conversation partners present during encounter Nurse  Referral source Nurse (RN/NT/LPN)  Reason for visit Routine spiritual support  OnCall Visit No  Interventions  Spiritual Care Interventions Made Established relationship of care and support;Compassionate presence   Patient visit per daughter and staff request.  Provided compassionate and supportive presence.  Offered follow up with return visit when daughter arrives.  Patient expressed appreciation for visit.

## 2023-11-22 NOTE — Progress Notes (Signed)
 PT Cancellation Note  Patient Details Name: Porschia Willbanks MRN: [id number] DOB: [birth year]   Cancelled Treatment:    Reason Eval/Treat Not Completed: Patient declined, no reason specified. Pt received eating breakfast in bed. Pt refused participation with therapy team, no reason specified. Will re-attempt at a later time.  Demetrus Pavao, SPT  Dasani Thurlow 11/22/2023, 10:02 AM

## 2023-11-23 ENCOUNTER — Ambulatory Visit: Admitting: Pulmonary Disease

## 2023-11-23 DIAGNOSIS — F419 Anxiety disorder, unspecified: Secondary | ICD-10-CM

## 2023-11-23 DIAGNOSIS — I2699 Other pulmonary embolism without acute cor pulmonale: Secondary | ICD-10-CM | POA: Diagnosis not present

## 2023-11-23 DIAGNOSIS — I25118 Atherosclerotic heart disease of native coronary artery with other forms of angina pectoris: Secondary | ICD-10-CM | POA: Diagnosis not present

## 2023-11-23 DIAGNOSIS — Z7189 Other specified counseling: Secondary | ICD-10-CM | POA: Diagnosis not present

## 2023-11-23 DIAGNOSIS — J449 Chronic obstructive pulmonary disease, unspecified: Secondary | ICD-10-CM | POA: Diagnosis not present

## 2023-11-23 MED ORDER — HYDROCODONE-ACETAMINOPHEN 5-325 MG PO TABS
1.0000 | ORAL_TABLET | Freq: Four times a day (QID) | ORAL | Status: DC | PRN
  Administered 2023-11-23: 1 via ORAL
  Administered 2023-11-24: 2 via ORAL
  Administered 2023-11-24: 1 via ORAL
  Administered 2023-11-24 – 2023-11-27 (×5): 2 via ORAL
  Administered 2023-11-27 – 2023-11-28 (×2): 1 via ORAL
  Filled 2023-11-23 (×3): qty 1
  Filled 2023-11-23: qty 2
  Filled 2023-11-23: qty 1
  Filled 2023-11-23 (×5): qty 2

## 2023-11-23 NOTE — Progress Notes (Addendum)
 Daily Progress Note   Patient Name: Terri Braun       Date: 11/23/2023 DOB: 04-Oct-1929  Age: 89 y.o. MRN#: [id number] Attending Physician: Maree Hue, MD Primary Care Physician: Lenon Layman ORN, MD Admit Date: 11/08/2023  Reason for Consultation/Follow-up: Establishing goals of care  Subjective: Notes reviewed.  In to see patient.  No family at bedside.  She is currently sitting in bed at this time.  Patient complains of leg pain, nursing providing medication for pain.  Orders for pain management reviewed.  Order modified to 1- 2 pills of Norco q 6 PRN for moderate to severe pain.  Will change this to alternative medication if needed.  Patient is aware she has Ativan available as needed for anxiety.  She denies other questions or concerns at this time.  Length of Stay: 14  Current Medications: Scheduled Meds:   apixaban  5 mg Oral BID   budesonide-glycopyrrolate-formoterol  2 puff Inhalation BID   celecoxib   100 mg Oral Daily   clopidogrel   75 mg Oral Daily   guaiFENesin  15 mL Oral Q6H   And   dextromethorphan  30 mg Oral Q12H   DULoxetine   60 mg Oral Daily   feeding supplement  237 mL Oral BID BM   isosorbide  dinitrate  10 mg Oral TID   losartan   50 mg Oral Daily   omeprazole  20 mg Oral Daily   mouth rinse  15 mL Mouth Rinse 4 times per day   sodium chloride  flush  3 mL Intravenous Q12H   traZODone  100 mg Oral QHS    Continuous Infusions:   PRN Meds: acetaminophen  **OR** acetaminophen , albuterol , HYDROcodone -acetaminophen , LORazepam, mouth rinse, polyethylene glycol  Physical Exam Pulmonary:     Effort: Pulmonary effort is normal.  Skin:    General: Skin is warm and dry.  Neurological:     Mental Status: She is alert.             Vital Signs: BP (!)  114/55 (BP Location: Right Arm)   Pulse 85   Temp 99.1 F (37.3 C) (Oral)   Resp 14   Ht 5' 5 (1.651 m)   Wt 64.4 kg   SpO2 98%   BMI 23.63 kg/m  SpO2: SpO2: 98 % O2 Device: O2 Device: Nasal Cannula O2 Flow Rate:  O2 Flow Rate (L/min): 2 L/min  Intake/output summary:  Intake/Output Summary (Last 24 hours) at 11/23/2023 1221 Last data filed at 11/22/2023 2143 Gross per 24 hour  Intake 123 ml  Output 550 ml  Net -427 ml   LBM: Last BM Date : 11/20/23 Baseline Weight: Weight: 64.4 kg Most recent weight: Weight: 64.4 kg   Patient Active Problem List   Diagnosis Date Noted   Goals of care, counseling/discussion 11/15/2023   Acute pulmonary embolism (HCC) 11/08/2023   Frequent falls 05/12/2023   Closed fracture of proximal end of left fibula 05/05/2023   Closed nondisplaced oblique fracture of shaft of left tibia 05/05/2023   UTI (urinary tract infection) 05/04/2023   COPD (chronic obstructive pulmonary disease) (HCC)    Tibial plateau fracture 05/03/2023   Moderate persistent asthma without complication 03/10/2022   Olecranon bursitis of right elbow 08/23/2021   Leg hematoma, left, initial encounter 05/03/2021   Syncope 05/03/2021   Osteoarthritis of left knee 04/12/2021   Leukocytes in urine 12/13/2019   Urinary frequency 12/13/2019   Closed fracture of metatarsal bone 12/25/2018   Polyp of ascending colon 02/16/2018   Diverticula, colon 02/16/2018   Hypothyroidism 02/16/2018   Essential hypertension, benign 02/16/2018   ASCVD (arteriosclerotic cardiovascular disease) 02/16/2018   Periumbilical abdominal pain 02/16/2018   GI bleed 02/16/2018   Iron deficiency anemia, unspecified 02/16/2018   Chronic liver disease 02/16/2018   Carrier of alpha-1-antitrypsin deficiency 02/16/2018   History of colonic polyps 02/16/2018   Abnormal liver CT 02/16/2018   Elevated liver enzymes 02/16/2018   History of hepatitis C 02/16/2018   Abnormal findings on imaging test  02/16/2018   Helicobacter pylori gastritis 02/16/2018   Gait instability 02/13/2018   Mixed hyperlipidemia 02/13/2018   Atherosclerosis of native coronary artery of native heart with stable angina pectoris 02/12/2018   Centrilobular emphysema (HCC) 02/12/2018   Anemia 02/12/2018   Benign neoplasm of cecum 01/16/2018   Heterozygous alpha 1-antitrypsin deficiency (HCC) 01/16/2018   GERD (gastroesophageal reflux disease) 01/16/2018   Bronchiectasis without complication (HCC) 05/16/2016   Moderate episode of recurrent major depressive disorder (HCC) 04/14/2016   Bloody stool 09/15/2015   Left lower quadrant pain 09/15/2015   Long term (current) use of antithrombotics/antiplatelets 09/15/2015   Dyspnea 05/18/2015   Anxiety 11/14/2011   DJD (degenerative joint disease) 11/14/2011   Abnormal stress test 04/20/2010   Status post coronary artery stent placement 04/20/2010   CAD (coronary artery disease) 04/20/2010    Palliative Care Assessment & Plan    Recommendations/Plan: Norco adjusted to 1-2 pills every 6 as needed for pain. Ativan 0.25 -0.5 mg available as needed for anxiety.  TOC is working on placement.   Code Status:    Code Status Orders  (From admission, onward)           Start     Ordered   11/15/23 1721  Do not attempt resuscitation (DNR) - Comfort care  (Code Status)  Continuous       Question Answer Comment  If patient has no pulse and is not breathing Do Not Attempt Resuscitation   In Pre-Arrest Conditions (Patient Is Breathing and Has a Pulse) Provide comfort measures. Relieve any mechanical airway obstruction. Avoid transfer unless required for comfort.   Consent: Discussion documented in EHR or advanced directives reviewed      11/15/23 1721           Code Status History     Date Active Date Inactive Code Status Order ID  Comments User Context   11/08/2023 1743 11/15/2023 1721 Full Code [id number]  Seena Marsa NOVAK, MD ED   05/03/2023 0840  05/11/2023 1834 Full Code [id number]  Laurita Cort DASEN, MD ED      Advance Directive Documentation    Flowsheet Row Most Recent Value  Type of Advance Directive Living will  Pre-existing out of facility DNR order (yellow form or pink MOST form) --  MOST Form in Place? --    Camelia Lewis, NP  Please contact Palliative Medicine Team phone at [phone] for questions and concerns.

## 2023-11-23 NOTE — Progress Notes (Signed)
 Progress Note   Patient: Terri Braun FMW:[id number] DOB: May 29, 1929 DOA: 11/08/2023     14 DOS: the patient was seen and examined on 11/23/2023   Brief hospital course: Terri Braun is a 89 y.o. female with medical history significant of hypertension, hyperlipidemia, hypothyroidism, GERD, CAD status post stent, depression, anxiety, alpha 1 antitrypsin, COPD, anemia presenting with cough/shortness of breath.  VQ scan confirmed acute bilateral PE. During hospitalization patient's oxygen requirement worsened and chest imaging showed findings concerning for pneumonia for which patient was initiated on antibiotics. Patient was also having cough with swallowing for which speech therapist was consulted, patient has been cleared to have a meal.  Palliative care also consulted for goals of care discussion.  10/23: TOC working on LTC placement with Hospice    Acute pulmonary embolism - Continue Eliquis   Community-acquired pneumonia with hypoxia - on room air now - S/p ceftriaxone  and azithromycin completed - Seen by SLP  Hyponatremia, mild Hyperkalemia-status post Lokelma - resolved  Hypertension - Continue home losartan , Imdur  changed to Isordil  as meds needs to be crushed   Hyperlipidemia - Continue home rosuvastatin    GERD - Continue PPI   CAD - Continue home rosuvastatin , Plavix  - Starting anticoagulation as above   Depression Anxiety - Continue home duloxetine  and trazodone   COPD Heterozygous for alpha 1 antitrypsin - Replace home Trelegy with formulary Breztri - Continue as needed albuterol    Normocytic anemia - Hb dropped to 6.6, s/p 1u pRBC improved to 7.8 - No evidence of GI bleeding - No further labs, will recheck in am  Sacral pressure  - stage I, present on adm   -- GOC Palliative care following, patient and daughter would like her to go to long-term care facility as she likely does not qualify for SNF No abx, blood transfusion, IV fluids,  labs Called daughter [name], discussed GOC - no further lab draws    DVT prophylaxis:      Heparin  Code Status:              DNR   Disposition Plan: TOC working on LTC/ care facility with hospice     Subjective:  No new issues,   Physical Exam:   Physical Exam  Constitutional: In no distress. Chronically ill appearing Cardiovascular: Normal rate, regular rhythm. No lower extremity edema  Pulmonary: Non labored breathing on [place], no wheezing or rales.   Abdominal: Soft. Non distended and non tender Neurological: Alert and oriented to person, place, and time.  Skin: Skin is warm and dry.         Data reviewed:    Latest Ref Rng & Units 11/16/2023    6:05 AM 11/15/2023    7:50 AM 11/14/2023    6:40 PM  CBC  WBC 4.0 - 10.5 K/uL 8.8  8.9    Hemoglobin 12.0 - 15.0 g/dL 7.8  7.7  6.6   Hematocrit 36.0 - 46.0 % 24.9  24.5    Platelets 150 - 400 K/uL 317  308         Latest Ref Rng & Units 11/16/2023    6:05 AM 11/15/2023    3:55 AM 11/14/2023    3:55 AM  BMP  Glucose 70 - 99 mg/dL 890  881  885   BUN 8 - 23 mg/dL 38  31  38   Creatinine 0.44 - 1.00 mg/dL 8.95  9.12  8.91   Sodium 135 - 145 mmol/L 132  131  132   Potassium 3.5 -  5.1 mmol/L 4.7  4.1  4.1   Chloride 98 - 111 mmol/L 101  99  100   CO2 22 - 32 mmol/L 25  24  24    Calcium  8.9 - 10.3 mg/dL 7.7  7.6  7.7     Vitals:   11/22/23 2014 11/23/23 0807 11/23/23 1754 11/23/23 2037  BP: (!) 133/51 (!) 114/55 (!) 147/39 (!) 130/44  Pulse: 86 85 91 85  Resp: 17 14 18 19   Temp: 99.5 F (37.5 C) 99.1 F (37.3 C) 98.6 F (37 C) 98.5 F (36.9 C)  TempSrc: Oral Oral Oral   SpO2: 96% 98% 94% 97%  Weight:      Height:       Time spent: 35 mins  Author: Cresencio Fairly, MD 11/23/2023 9:38 PM  For on call 7p-7a review [email or web address].

## 2023-11-23 NOTE — Progress Notes (Signed)
 Physical Therapy Treatment Patient Details Name: Terri Braun MRN: [id number] DOB: Nov 21, 1929 Today's Date: 11/23/2023   History of Present Illness Pt is a 89 y.o. female with medical history significant of hypertension, hyperlipidemia, hypothyroidism, GERD, CAD status post stent, depression, anxiety, alpha 1 antitrypsin, COPD, anemia presenting with cough/shortness of breath and found to have acute PE.    PT Comments  Pt seen for re-eval this date. Cotx with OT for safety. Pt now requiring max x2 to roll and maintain sidelying position. Able to attempt reaching for bed rails to assist, ultimately requiring assistance to reach. Pt agreeable to participate rolling, however declines attempt to sit at EOB. Pt continues to demonstrate deficits in weakness/activity tolerance/ROM. She reports pain with movement mostly in her LEs and also has skin breakdown around her  sacral area. Nursing notified and performed skin check during session. Pt has inconsistent participation with therapy, however did choose to participate today. Will continue to see pt acutely with 3 session trial to determine rehabability.     If plan is discharge home, recommend the following: Two people to help with walking and/or transfers;A lot of help with bathing/dressing/bathroom;Assist for transportation   Can travel by private vehicle     No  Equipment Recommendations  None recommended by PT    Recommendations for Other Services       Precautions / Restrictions Precautions Precautions: Fall Recall of Precautions/Restrictions: Intact Restrictions Weight Bearing Restrictions Per Provider Order: No     Mobility  Bed Mobility Overal bed mobility: Needs Assistance Bed Mobility: Rolling Rolling: Max assist, +2 for physical assistance         General bed mobility comments: Participated in rolling with use of bed rails. Needs assistaance to reach.    Transfers                   General transfer comment:  Unsafe to test    Ambulation/Gait               General Gait Details: Unsafe to test   Stairs             Wheelchair Mobility     Tilt Bed    Modified Rankin (Stroke Patients Only)       Balance                                            Communication Communication Communication: Impaired Factors Affecting Communication: Hearing impaired  Cognition Arousal: Alert Behavior During Therapy: WFL for tasks assessed/performed   PT - Cognitive impairments: No family/caregiver present to determine baseline                       PT - Cognition Comments: Pt is agreeable to therapy session Following commands: Impaired Following commands impaired: Follows one step commands with increased time    Cueing Cueing Techniques: Verbal cues, Visual cues, Tactile cues  Exercises      General Comments        Pertinent Vitals/Pain Pain Assessment Pain Assessment: Faces Faces Pain Scale: Hurts even more Pain Location: No specified location, however vocalizes pain with LE movement. Pain Descriptors / Indicators: Discomfort, Grimacing, Moaning Pain Intervention(s): Premedicated before session, Monitored during session    Home Living  Prior Function            PT Goals (current goals can now be found in the care plan section) Acute Rehab PT Goals PT Goal Formulation: With patient Time For Goal Achievement: 11/24/23 Potential to Achieve Goals: Poor Progress towards PT goals: Not progressing toward goals - comment    Frequency    Min 2X/week      PT Plan      Co-evaluation PT/OT/SLP Co-Evaluation/Treatment: Yes Reason for Co-Treatment: For patient/therapist safety PT goals addressed during session: Mobility/safety with mobility OT goals addressed during session: ADL's and self-care      AM-PAC PT 6 Clicks Mobility   Outcome Measure  Help needed turning from your back to your side  while in a flat bed without using bedrails?: Total Help needed moving from lying on your back to sitting on the side of a flat bed without using bedrails?: Total Help needed moving to and from a bed to a chair (including a wheelchair)?: Total Help needed standing up from a chair using your arms (e.g., wheelchair or bedside chair)?: Total Help needed to walk in hospital room?: Total Help needed climbing 3-5 steps with a railing? : Total 6 Click Score: 6    End of Session Equipment Utilized During Treatment: Oxygen Activity Tolerance: Patient limited by fatigue Patient left: in bed;with call bell/phone within reach;with bed alarm set Nurse Communication: Mobility status PT Visit Diagnosis: Other abnormalities of gait and mobility (R26.89);Muscle weakness (generalized) (M62.81);Difficulty in walking, not elsewhere classified (R26.2)     Time: 8983-8957 PT Time Calculation (min) (ACUTE ONLY): 26 min  Charges:                            Hakop Humbarger, SPT    Kham Zuckerman 11/23/2023, 1:04 PM

## 2023-11-23 NOTE — TOC Progression Note (Addendum)
 Transition of Care [facility]) - Progression Note    Patient Details  Name: Terri Braun MRN: [id number] Date of Birth: 05/31/1929  [facility]) CM/SW Contact  Lauraine JAYSON Carpen, LCSW Phone Number: 11/23/2023, 10:58 AM  Clinical Narrative:   Spoke to daughter over the phone. Updated FL2. Starting bed search for LTC SNF with hospice.   1:29 pm: [employer] private pay rate is $288 per day [phone] per month). Left messages for [facility] and [facility]. [facility] does not have any long-term beds at this time.  2:45 pm: [facility] does not have any beds. Updated daughter. She is agreeable to [facility], [facility] in [place], and [facility]. Left another voicemail for [facility]. [facility] has not reviewed yet. [facility] does not have any beds.  2:32 pm: [facility] in [place] is $310 per day starting on November 1st. Sent details to daughter. She asked about [facility]. CSW sent referral and left the admissions coordinator a voicemail.  4:35 pm: [facility] declined.  Expected Discharge Plan and Services                                               Social Drivers of Health (SDOH) Interventions SDOH Screenings   Food Insecurity: No Food Insecurity (11/10/2023)  Housing: Low Risk  (11/10/2023)  Transportation Needs: No Transportation Needs (11/10/2023)  Utilities: Not At Risk (11/10/2023)  Alcohol Screen: Low Risk  (09/29/2021)  Depression (PHQ2-9): Low Risk  (09/29/2021)  Financial Resource Strain: Low Risk  (08/11/2023)   Received from [facility]  Physical Activity: Insufficiently Active (09/29/2021)  Social Connections: Moderately Isolated (11/10/2023)  Stress: No Stress Concern Present (09/29/2021)  Tobacco Use: Low Risk  (11/08/2023)    Readmission Risk Interventions     No data to display

## 2023-11-23 NOTE — Plan of Care (Signed)
  Problem: Education: Goal: Knowledge of General Education information will improve Description: Including pain rating scale, medication(s)/side effects and non-pharmacologic comfort measures Outcome: Progressing   Problem: Nutrition: Goal: Adequate nutrition will be maintained Outcome: Progressing   Problem: Elimination: Goal: Will not experience complications related to bowel motility Outcome: Progressing Goal: Will not experience complications related to urinary retention Outcome: Progressing   Problem: Pain Managment: Goal: General experience of comfort will improve and/or be controlled Outcome: Progressing   Problem: Safety: Goal: Ability to remain free from injury will improve Outcome: Progressing   Problem: Skin Integrity: Goal: Risk for impaired skin integrity will decrease Outcome: Progressing

## 2023-11-23 NOTE — NC FL2 (Signed)
 [place]  MEDICAID [id number] LEVEL OF CARE FORM     IDENTIFICATION  Patient Name: Terri Braun Birthdate: [birth year] Sex: female Admission Date (Current Location): 11/08/2023  [facility] and [place] Number:  Chiropodist and Address:  [facility], [address], [place], [place] [address]      Provider Number: [id number]  Attending Physician Name and Address:  Maree Hue, MD  Relative Name and Phone Number:  [name] Foots [phone]    Current Level of Care: Hospital Recommended Level of Care: Skilled Nursing Facility (with hospice) Prior Approval Number:    Date Approved/Denied:  PASRR Number: [id number]  Discharge Plan: SNF (with hospice)    Current Diagnoses: Patient Active Problem List   Diagnosis Date Noted   Goals of care, counseling/discussion 11/15/2023   Acute pulmonary embolism (HCC) 11/08/2023   Frequent falls 05/12/2023   Closed fracture of proximal end of left fibula 05/05/2023   Closed nondisplaced oblique fracture of shaft of left tibia 05/05/2023   UTI (urinary tract infection) 05/04/2023   COPD (chronic obstructive pulmonary disease) (HCC)    Tibial plateau fracture 05/03/2023   Moderate persistent asthma without complication 03/10/2022   Olecranon bursitis of right elbow 08/23/2021   Leg hematoma, left, initial encounter 05/03/2021   Syncope 05/03/2021   Osteoarthritis of left knee 04/12/2021   Leukocytes in urine 12/13/2019   Urinary frequency 12/13/2019   Closed fracture of metatarsal bone 12/25/2018   Polyp of ascending colon 02/16/2018   Diverticula, colon 02/16/2018   Hypothyroidism 02/16/2018   Essential hypertension, benign 02/16/2018   ASCVD (arteriosclerotic cardiovascular disease) 02/16/2018   Periumbilical abdominal pain 02/16/2018   GI bleed 02/16/2018   Iron deficiency anemia, unspecified 02/16/2018   Chronic liver disease 02/16/2018   Carrier of alpha-1-antitrypsin deficiency 02/16/2018    History of colonic polyps 02/16/2018   Abnormal liver CT 02/16/2018   Elevated liver enzymes 02/16/2018   History of hepatitis C 02/16/2018   Abnormal findings on imaging test 02/16/2018   Helicobacter pylori gastritis 02/16/2018   Gait instability 02/13/2018   Mixed hyperlipidemia 02/13/2018   Atherosclerosis of native coronary artery of native heart with stable angina pectoris 02/12/2018   Centrilobular emphysema (HCC) 02/12/2018   Anemia 02/12/2018   Benign neoplasm of cecum 01/16/2018   Heterozygous alpha 1-antitrypsin deficiency (HCC) 01/16/2018   GERD (gastroesophageal reflux disease) 01/16/2018   Bronchiectasis without complication (HCC) 05/16/2016   Moderate episode of recurrent major depressive disorder (HCC) 04/14/2016   Bloody stool 09/15/2015   Left lower quadrant pain 09/15/2015   Long term (current) use of antithrombotics/antiplatelets 09/15/2015   Dyspnea 05/18/2015   Anxiety 11/14/2011   DJD (degenerative joint disease) 11/14/2011   Abnormal stress test 04/20/2010   Status post coronary artery stent placement 04/20/2010   CAD (coronary artery disease) 04/20/2010    Orientation RESPIRATION BLADDER Height & Weight     Self, Time, Situation, Place  O2 (Nasal Cannula 2 L) Incontinent, External catheter Weight: 141 lb 15.6 oz (64.4 kg) Height:  5' 5 (165.1 cm)  BEHAVIORAL SYMPTOMS/MOOD NEUROLOGICAL BOWEL NUTRITION STATUS   (None)  (None) Incontinent Diet (Regular)  AMBULATORY STATUS COMMUNICATION OF NEEDS Skin   Extensive Assist Verbally Bruising, Other (Comment), PU Stage and Appropriate Care (Erythema/redness.) PU Stage 1 Dressing:  (Sacrum: Moisture barrier)                     Personal Care Assistance Level of Assistance  Bathing, Feeding, Dressing Bathing Assistance: Maximum assistance Feeding assistance: Limited  assistance Dressing Assistance: Maximum assistance     Functional Limitations Info  Sight, Hearing, Speech Sight Info: Adequate Hearing  Info: Adequate Speech Info: Adequate    SPECIAL CARE FACTORS FREQUENCY                   Contractures Contractures Info: Not present    Additional Factors Info  Code Status, Allergies Code Status Info: DNR Allergies Info: Shellfish Allergy, Shellfish Protein-containing drug products, Sulfa antibiotics           Current Medications (11/23/2023):  This is the current hospital active medication list Current Facility-Administered Medications  Medication Dose Route Frequency Provider Last Rate Last Admin   acetaminophen  (TYLENOL ) tablet 650 mg  650 mg Oral Q6H PRN Melvin, Alexander B, MD   650 mg at 11/14/23 2010   Or   acetaminophen  (TYLENOL ) suppository 650 mg  650 mg Rectal Q6H PRN Seena Marsa NOVAK, MD       albuterol  (PROVENTIL ) (2.5 MG/3ML) 0.083% nebulizer solution 2.5 mg  2.5 mg Inhalation Q6H PRN Seena Marsa NOVAK, MD   2.5 mg at 11/08/23 2045   apixaban (ELIQUIS) tablet 5 mg  5 mg Oral BID Dorinda Homans T, MD   5 mg at 11/23/23 9074   budesonide-glycopyrrolate-formoterol (BREZTRI) 160-9-4.8 MCG/ACT inhaler 2 puff  2 puff Inhalation BID Melvin, Alexander B, MD   2 puff at 11/23/23 9074   celecoxib  (CELEBREX ) capsule 100 mg  100 mg Oral Daily Melvin, Alexander B, MD   100 mg at 11/23/23 9075   clopidogrel  (PLAVIX ) tablet 75 mg  75 mg Oral Daily Melvin, Alexander B, MD   75 mg at 11/23/23 0925   guaiFENesin (ROBITUSSIN) 100 MG/5ML liquid 15 mL  15 mL Oral Q6H Dail Rankin RAMAN, RPH   15 mL at 11/23/23 0549   And   dextromethorphan (DELSYM) 30 MG/5ML liquid 30 mg  30 mg Oral Q12H Dail Rankin RAMAN, RPH   30 mg at 11/22/23 2307   DULoxetine  (CYMBALTA ) DR capsule 60 mg  60 mg Oral Daily Melvin, Alexander B, MD   60 mg at 11/23/23 9074   feeding supplement (ENSURE ENLIVE / ENSURE PLUS) liquid 237 mL  237 mL Oral BID BM Melvin, Alexander B, MD   237 mL at 11/23/23 9073   HYDROcodone -acetaminophen  (NORCO/VICODIN) 5-325 MG per tablet 1 tablet  1 tablet Oral Q4H PRN Duncan, Hazel V,  MD   1 tablet at 11/23/23 9074   isosorbide  dinitrate (ISORDIL ) tablet 10 mg  10 mg Oral TID Ponnala, Shruthi, MD   10 mg at 11/23/23 9075   LORazepam (ATIVAN) tablet 0.25-0.5 mg  0.25-0.5 mg Oral Q4H PRN Signa Salines, NP       losartan  (COZAAR ) tablet 50 mg  50 mg Oral Daily Melvin, Alexander B, MD   50 mg at 11/23/23 9074   omeprazole disintegrating tablet 20 mg  20 mg Oral Daily Ponnala, Shruthi, MD   20 mg at 11/23/23 9075   Oral care mouth rinse  15 mL Mouth Rinse PRN Franchot Novel, MD       Oral care mouth rinse  15 mL Mouth Rinse 4 times per day Franchot Novel, MD   15 mL at 11/23/23 9073   polyethylene glycol (MIRALAX  / GLYCOLAX ) packet 17 g  17 g Oral Daily PRN Seena Marsa NOVAK, MD   17 g at 11/22/23 1016   sodium chloride  flush (NS) 0.9 % injection 3 mL  3 mL Intravenous Q12H Seena Marsa NOVAK, MD  3 mL at 11/23/23 0926   traZODone (DESYREL) tablet 100 mg  100 mg Oral QHS Melvin, Alexander B, MD   100 mg at 11/22/23 2142     Discharge Medications: Please see discharge summary for a list of discharge medications.  Relevant Imaging Results:  Relevant Lab Results:   Additional Information SS#: [id number]. Need LTC with hospice. Need private pay cost.  Lauraine JAYSON Carpen, LCSW

## 2023-11-23 NOTE — Evaluation (Signed)
 Occupational Therapy Re-Evaluation Patient Details Name: Terri Braun MRN: [id number] DOB: 07-Jun-1929 Today's Date: 11/23/2023   History of Present Illness   Pt is a 89 y.o. female with medical history significant of hypertension, hyperlipidemia, hypothyroidism, GERD, CAD status post stent, depression, anxiety, alpha 1 antitrypsin, COPD, anemia presenting with cough/shortness of breath and found to have acute PE.     Clinical Impressions Pt seen for OT re-evaluation. Pt appearing confused and requires increased time for processing, reporting BLE pain with pain meds received prior to session. Pt demonstrates global weakness and deconditioning, with decreased ROM in BUE/LE. Requires MAX A +2 for rolling R <> L in bed, and MAX A for grooming tasks bed level, MIN - MAX A for self-feeding. Pt with inconsistent participation in therapy, is agreeable to OT returning at later date for mobility and ADLs. Will place on trial of 3 sessions to assess potential for rehab.      If plan is discharge home, recommend the following:   Two people to help with walking and/or transfers;Two people to help with bathing/dressing/bathroom     Functional Status Assessment   Patient has had a recent decline in their functional status and demonstrates the ability to make significant improvements in function in a reasonable and predictable amount of time.     Equipment Recommendations   Other (comment)      Precautions/Restrictions   Precautions Precautions: Fall Recall of Precautions/Restrictions: Intact Restrictions Weight Bearing Restrictions Per Provider Order: No     Mobility Bed Mobility Overal bed mobility: Needs Assistance Bed Mobility: Rolling Rolling: Max assist, +2 for physical assistance         General bed mobility comments: Participated in rolling with use of bed rails. Needs assistaance to reach.    Transfers Overall transfer level: Needs assistance                  General transfer comment: NT      Balance Overall balance assessment: Needs assistance     Sitting balance - Comments: NT                                   ADL either performed or assessed with clinical judgement   ADL Overall ADL's : Needs assistance/impaired Eating/Feeding: Minimal assistance;Bed level Eating/Feeding Details (indicate cue type and reason): required hand-over-hand assist for self-feeding at bed level. placed pillows for optimal UE support for greater independnece during task performance Grooming: Wash/dry face;Wash/dry hands;Bed level;Maximal assistance Grooming Details (indicate cue type and reason): pt found with dried blood around nostrils; minA to wash face with pt unable to self-initiate                             Functional mobility during ADLs: Maximal assistance;+2 for physical assistance        Pertinent Vitals/Pain Pain Assessment Pain Assessment: Faces Faces Pain Scale: Hurts even more Pain Location: BLE Pain Descriptors / Indicators: Discomfort, Grimacing, Moaning Pain Intervention(s): Limited activity within patient's tolerance, Repositioned, Monitored during session, Premedicated before session     Extremity/Trunk Assessment Upper Extremity Assessment Upper Extremity Assessment: Generalized weakness   Lower Extremity Assessment Lower Extremity Assessment: Generalized weakness       Communication Communication Communication: Impaired Factors Affecting Communication: Hearing impaired   Cognition Arousal: Alert Behavior During Therapy: WFL for tasks assessed/performed Cognition: Cognition impaired   Orientation impairments: Situation,  Time Awareness: Intellectual awareness impaired, Online awareness impaired Memory impairment (select all impairments): Short-term memory, Working Biochemist, clinical functioning impairment (select all impairments): Initiation, Sequencing, Problem solving OT - Cognition  Comments: confused, does not seem fully aware of situation                 Following commands: Impaired Following commands impaired: Follows one step commands with increased time     Cueing  General Comments   Cueing Techniques: Verbal cues;Visual cues;Tactile cues  RN in room during session to assess skin breakdown on buttocks           Home Living Family/patient expects to be discharged to:: Skilled nursing facility                                 Additional Comments: [facility]      Prior Functioning/Environment Prior Level of Function : Needs assist;History of Falls (last six months)             Mobility Comments: Pt's daughter reports that pt has taken some steps with PT/extensive assist at [facility] and has been performing slide board transfers. ADLs Comments: Total assistance from staff at facility for ADLs, can self feed    OT Problem List: Decreased strength;Decreased activity tolerance;Impaired balance (sitting and/or standing)   OT Treatment/Interventions: Self-care/ADL training;Therapeutic exercise;Therapeutic activities;Energy conservation;DME and/or AE instruction;Patient/family education;Balance training      OT Goals(Current goals can be found in the care plan section)   Acute Rehab OT Goals OT Goal Formulation: With patient/family Time For Goal Achievement: 12/07/23 Potential to Achieve Goals: Poor   OT Frequency:  Min 2X/week    Co-evaluation PT/OT/SLP Co-Evaluation/Treatment: Yes Reason for Co-Treatment: For patient/therapist safety PT goals addressed during session: Mobility/safety with mobility OT goals addressed during session: ADL's and self-care      AM-PAC OT 6 Clicks Daily Activity     Outcome Measure Help from another person eating meals?: A Little Help from another person taking care of personal grooming?: A Little Help from another person toileting, which includes using toliet,  bedpan, or urinal?: Total Help from another person bathing (including washing, rinsing, drying)?: Total Help from another person to put on and taking off regular upper body clothing?: Total Help from another person to put on and taking off regular lower body clothing?: Total 6 Click Score: 10   End of Session Equipment Utilized During Treatment: Oxygen Nurse Communication: Other (comment) (skin breakdown)  Activity Tolerance: Patient tolerated treatment well Patient left: in bed;with call bell/phone within reach;with bed alarm set  OT Visit Diagnosis: Other abnormalities of gait and mobility (R26.89);Muscle weakness (generalized) (M62.81)                Time: 8983-8957 OT Time Calculation (min): 26 min Charges:  OT General Charges $OT Visit: 1 Visit OT Evaluation $OT Re-eval: 1 Re-eval Elisia Stepp L. Caleigh Rabelo, OTR/L  11/23/23, 2:04 PM

## 2023-11-23 NOTE — Progress Notes (Addendum)
 [facility] Room 257 [facility] Liaison Note   TOC has initiated a LTC with hospice bed search for patient. HL team will follow through discharge disposition.    Please call with any hospice questions or concerns.  Thank you for the opportunity to participate in this patient's care  [facility] Liaison 336 [phone]

## 2023-11-24 DIAGNOSIS — J449 Chronic obstructive pulmonary disease, unspecified: Secondary | ICD-10-CM | POA: Diagnosis not present

## 2023-11-24 DIAGNOSIS — K219 Gastro-esophageal reflux disease without esophagitis: Secondary | ICD-10-CM | POA: Diagnosis not present

## 2023-11-24 DIAGNOSIS — F419 Anxiety disorder, unspecified: Secondary | ICD-10-CM | POA: Diagnosis not present

## 2023-11-24 DIAGNOSIS — I2699 Other pulmonary embolism without acute cor pulmonale: Secondary | ICD-10-CM | POA: Diagnosis not present

## 2023-11-24 LAB — BASIC METABOLIC PANEL WITH GFR
Anion gap: 6 (ref 5–15)
BUN: 31 mg/dL — ABNORMAL HIGH (ref 8–23)
CO2: 26 mmol/L (ref 22–32)
Calcium: 7.8 mg/dL — ABNORMAL LOW (ref 8.9–10.3)
Chloride: 98 mmol/L (ref 98–111)
Creatinine, Ser: 0.81 mg/dL (ref 0.44–1.00)
GFR, Estimated: >60 mL/min (ref >60)
Glucose, Bld: 104 mg/dL — ABNORMAL HIGH (ref 70–99)
Potassium: 5.5 mmol/L — ABNORMAL HIGH (ref 3.5–5.1)
Sodium: 130 mmol/L — ABNORMAL LOW (ref 135–145)

## 2023-11-24 LAB — CBC
HCT: 21.2 % — ABNORMAL LOW (ref 36.0–46.0)
Hemoglobin: 6.7 g/dL — ABNORMAL LOW (ref 12.0–15.0)
MCH: 28.5 pg (ref 26.0–34.0)
MCHC: 31.6 g/dL (ref 30.0–36.0)
MCV: 90.2 fL (ref 80.0–100.0)
Platelets: 402 K/uL — ABNORMAL HIGH (ref 150–400)
RBC: 2.35 MIL/uL — ABNORMAL LOW (ref 3.87–5.11)
RDW: 18 % — ABNORMAL HIGH (ref 11.5–15.5)
WBC: 6.7 K/uL (ref 4.0–10.5)
nRBC: 0 % (ref 0.0–0.2)

## 2023-11-24 LAB — PREPARE RBC (CROSSMATCH)

## 2023-11-24 MED ORDER — SODIUM CHLORIDE 0.9% IV SOLUTION: Freq: Once | INTRAVENOUS | Status: AC

## 2023-11-24 MED ORDER — FUROSEMIDE 10 MG/ML IJ SOLN
40.0000 mg | Freq: Once | INTRAMUSCULAR | Status: AC
  Administered 2023-11-24: 40 mg via INTRAVENOUS
  Filled 2023-11-24: qty 4

## 2023-11-24 MED ORDER — DEXTROMETHORPHAN POLISTIREX ER 30 MG/5ML PO SUER
30.0000 mg | Freq: Two times a day (BID) | ORAL | Status: DC
  Administered 2023-11-25 – 2023-11-27 (×6): 30 mg via ORAL
  Filled 2023-11-24 (×9): qty 5

## 2023-11-24 MED ORDER — GUAIFENESIN 100 MG/5ML PO LIQD
15.0000 mL | Freq: Four times a day (QID) | ORAL | Status: DC
  Administered 2023-11-24 – 2023-11-28 (×15): 15 mL via ORAL
  Filled 2023-11-24: qty 20
  Filled 2023-11-24: qty 10
  Filled 2023-11-24: qty 20
  Filled 2023-11-24: qty 10
  Filled 2023-11-24 (×9): qty 20
  Filled 2023-11-24: qty 10
  Filled 2023-11-24 (×3): qty 20
  Filled 2023-11-24: qty 10

## 2023-11-24 NOTE — TOC Progression Note (Signed)
 Transition of Care [facility]) - Progression Note    Patient Details  Name: Terri Braun MRN: [id number] Date of Birth: 1929-11-20  [facility]) CM/SW Contact  Victory Jackquline RAMAN, RN Phone Number: 11/24/2023, 4:53 PM  Clinical Narrative:   10:28am: RNCM spoke to [facility] in [place] and she said that they can take the patient as a private pay. The family would have to come in to do the paperwork on Monday morning at 9:30am. RNCM will continue to follow discharge for planning/care coordination and update as applicable.   4:45pm Spoke to patient's daughter and she is in agreement with moving forward. She is going to go to [facility] on Monday morning to do the paperwork. She has an appointment for 9:30am to meet with Sonny.                        Expected Discharge Plan and Services                                               Social Drivers of Health (SDOH) Interventions SDOH Screenings   Food Insecurity: No Food Insecurity (11/10/2023)  Housing: Low Risk  (11/10/2023)  Transportation Needs: No Transportation Needs (11/10/2023)  Utilities: Not At Risk (11/10/2023)  Alcohol Screen: Low Risk  (09/29/2021)  Depression (PHQ2-9): Low Risk  (09/29/2021)  Financial Resource Strain: Low Risk  (08/11/2023)   Received from [facility]  Physical Activity: Insufficiently Active (09/29/2021)  Social Connections: Moderately Isolated (11/10/2023)  Stress: No Stress Concern Present (09/29/2021)  Tobacco Use: Low Risk  (11/08/2023)    Readmission Risk Interventions     No data to display

## 2023-11-24 NOTE — Progress Notes (Signed)
   11/24/23 1030  Spiritual Encounters  Type of Visit Follow up  Care provided to: Pt and family  Referral source Chaplain team  Reason for visit Routine spiritual support  OnCall Visit Yes   Made a follow up visit with patient, visited with patient and her son-in-law who is also her minister.  Offered compassionate presence and support.

## 2023-11-24 NOTE — Progress Notes (Addendum)
 [facility] Room 257 [facility] Liaison Note  Patient is still not appropriate for the IPU.  Will continue to monitor for changes in her condition.  Hospice will follow at LTC when bed has been found.    Please call with any hospice related questions or concerns.  Thank you for the opportunity to participate in this patient's care.    [facility] [phone]

## 2023-11-24 NOTE — Progress Notes (Addendum)
 Progress Note   Patient: Terri Braun FMW:[id number] DOB: November 13, 1929 DOA: 11/08/2023     15 DOS: the patient was seen and examined on 11/24/2023   Brief hospital course: Terri Braun is a 89 y.o. female with medical history significant of hypertension, hyperlipidemia, hypothyroidism, GERD, CAD status post stent, depression, anxiety, alpha 1 antitrypsin, COPD, anemia presenting with cough/shortness of breath.  VQ scan confirmed acute bilateral PE. During hospitalization patient's oxygen requirement worsened and chest imaging showed findings concerning for pneumonia for which patient was initiated on antibiotics. Patient was also having cough with swallowing for which speech therapist was consulted, patient has been cleared to have a meal.  Palliative care also consulted for goals of care discussion.  10/23: TOC working on LTC placement with Hospice 10/24: Hb 6.7 - 1 PRBC transfusion and Lasix 40 mg for K of 5.5, DC tele and transfer to med-surg    Acute pulmonary embolism - Continue Eliquis   Community-acquired pneumonia with hypoxia - on room air now - S/p ceftriaxone  and azithromycin completed - Seen by SLP  Hyponatremia, mild Hyperkalemia-status post Lokelma - resolved  Hypertension - Continue Isordil . Stop Losartan  due to hyperkalemia  Hyperkalemia K 5.5, Lasix 40 mg IV once   Hyperlipidemia - Continue home rosuvastatin    GERD - Continue PPI   CAD - Continue home rosuvastatin , will stop plavix  as she's on Eliquis considering Hb 6.7    Depression Anxiety - Continue home duloxetine  and trazodone   COPD Heterozygous for alpha 1 antitrypsin - Replace home Trelegy with formulary Breztri - Continue as needed albuterol    Normocytic anemia - Hb dropped to 6.6, s/p 1u pRBC improved to 7.8 - Hb 6.7 this am, transfuse 1 PRBC, stopping Plavix   Sacral pressure  - stage I, present on adm   -- GOC Possibly [facility] with Hospice on Monday. No abx, blood  transfusion, IV fluids, labs - will d/w family again over the weekend.  Son in law was at bedside and was updated   DVT prophylaxis:     Eliquis  Code Status:              DNR   Disposition Plan: Possibly [facility] with H[facility]ice on Monday.     Subjective:  No new issues,   Physical Exam:   Physical Exam  Constitutional: In no distress. Chronically ill appearing Cardiovascular: Normal rate, regular rhythm. No lower extremity edema  Pulmonary: Non labored breathing on [place], no wheezing or rales.   Abdominal: Soft. Non distended and non tender Neurological: Alert and oriented to person, place, and time.  Skin: Skin is warm and dry.         Data reviewed:    Latest Ref Rng & Units 11/24/2023    7:05 AM 11/16/2023    6:05 AM 11/15/2023    7:50 AM  CBC  WBC 4.0 - 10.5 K/uL 6.7  8.8  8.9   Hemoglobin 12.0 - 15.0 g/dL 6.7  7.8  7.7   Hematocrit 36.0 - 46.0 % 21.2  24.9  24.5   Platelets 150 - 400 K/uL 402  317  308        Latest Ref Rng & Units 11/24/2023    7:05 AM 11/16/2023    6:05 AM 11/15/2023    3:55 AM  BMP  Glucose 70 - 99 mg/dL 895  890  881   BUN 8 - 23 mg/dL 31  38  31   Creatinine 0.44 - 1.00 mg/dL 9.18  8.95  9.12  Sodium 135 - 145 mmol/L 130  132  131   Potassium 3.5 - 5.1 mmol/L 5.5  4.7  4.1   Chloride 98 - 111 mmol/L 98  101  99   CO2 22 - 32 mmol/L 26  25  24    Calcium  8.9 - 10.3 mg/dL 7.8  7.7  7.6     Vitals:   11/24/23 1339 11/24/23 1406 11/24/23 1618 11/24/23 1730  BP: (!) 143/53 (!) 149/56 126/62 (!) 146/51  Pulse: 99 98 87 89  Resp: 18 18 19 20   Temp: 98.5 F (36.9 C) 98.5 F (36.9 C) 97.8 F (36.6 C) 98.2 F (36.8 C)  TempSrc: Axillary Axillary Oral Axillary  SpO2: 99% 96% 97% 98%  Weight:      Height:       Time spent: 35 mins  Author: Cresencio Fairly, MD 11/24/2023 7:44 PM  For on call 7p-7a review [email or web address].

## 2023-11-24 NOTE — Plan of Care (Signed)

## 2023-11-24 NOTE — Plan of Care (Signed)

## 2023-11-25 DIAGNOSIS — I2699 Other pulmonary embolism without acute cor pulmonale: Secondary | ICD-10-CM | POA: Diagnosis not present

## 2023-11-25 LAB — TYPE AND SCREEN
ABO/RH(D): O POS
Antibody Screen: NEGATIVE
Unit division: 0

## 2023-11-25 LAB — BPAM RBC
Blood Product Expiration Date: 202511252359
ISSUE DATE / TIME: 202510241331
Unit Type and Rh: 5100

## 2023-11-25 LAB — CBC
HCT: 27.8 % — ABNORMAL LOW (ref 36.0–46.0)
Hemoglobin: 9 g/dL — ABNORMAL LOW (ref 12.0–15.0)
MCH: 28.3 pg (ref 26.0–34.0)
MCHC: 32.4 g/dL (ref 30.0–36.0)
MCV: 87.4 fL (ref 80.0–100.0)
Platelets: 460 K/uL — ABNORMAL HIGH (ref 150–400)
RBC: 3.18 MIL/uL — ABNORMAL LOW (ref 3.87–5.11)
RDW: 17.1 % — ABNORMAL HIGH (ref 11.5–15.5)
WBC: 8.2 K/uL (ref 4.0–10.5)
nRBC: 0 % (ref 0.0–0.2)

## 2023-11-25 LAB — BASIC METABOLIC PANEL WITH GFR
Anion gap: 12 (ref 5–15)
BUN: 36 mg/dL — ABNORMAL HIGH (ref 8–23)
CO2: 23 mmol/L (ref 22–32)
Calcium: 8 mg/dL — ABNORMAL LOW (ref 8.9–10.3)
Chloride: 94 mmol/L — ABNORMAL LOW (ref 98–111)
Creatinine, Ser: 0.78 mg/dL (ref 0.44–1.00)
GFR, Estimated: >60 mL/min (ref >60)
Glucose, Bld: 123 mg/dL — ABNORMAL HIGH (ref 70–99)
Potassium: 5 mmol/L (ref 3.5–5.1)
Sodium: 129 mmol/L — ABNORMAL LOW (ref 135–145)

## 2023-11-25 LAB — PROTIME-INR
INR: 1.7 — ABNORMAL HIGH (ref 0.8–1.2)
Prothrombin Time: 20.6 s — ABNORMAL HIGH (ref 11.4–15.2)

## 2023-11-25 LAB — APTT: aPTT: 53 s — ABNORMAL HIGH (ref 24–36)

## 2023-11-25 LAB — OSMOLALITY: Osmolality: 284 mosm/kg (ref 275–295)

## 2023-11-25 MED ORDER — HEPARIN (PORCINE) 25000 UT/250ML-% IV SOLN
900.0000 [IU]/h | INTRAVENOUS | Status: DC
  Administered 2023-11-25: 1050 [IU]/h via INTRAVENOUS
  Administered 2023-11-26: 900 [IU]/h via INTRAVENOUS
  Filled 2023-11-25 (×2): qty 250

## 2023-11-25 NOTE — Plan of Care (Signed)

## 2023-11-25 NOTE — Progress Notes (Addendum)
 PHARMACY - ANTICOAGULATION CONSULT NOTE  Pharmacy Consult for heparin infusion  Indication: pulmonary embolus  Allergies  Allergen Reactions   Shellfish Allergy Anaphylaxis    Ended up in the ED after eating shellfish, caused diarrhea and severe GI upset , unsure if caused SOB   Shellfish Protein-Containing Drug Products     Other reaction(s): Not available   Sulfa Antibiotics     Patient Measurements: Height: 5' 5 (165.1 cm) Weight: 64.4 kg (141 lb 15.6 oz) IBW/kg (Calculated) : 57 HEPARIN DW (KG): 64.4  Vital Signs: Temp: 98.5 F (36.9 C) (10/25 1159) BP: 121/47 (10/25 1159) Pulse Rate: 87 (10/25 1159)  Labs: Recent Labs    11/24/23 0705 11/25/23 0518  HGB 6.7* 9.0*  HCT 21.2* 27.8*  PLT 402* 460*  CREATININE 0.81 0.78    Estimated Creatinine Clearance: 40.4 mL/min (by C-G formula based on SCr of 0.78 mg/dL).   Medical History: Past Medical History:  Diagnosis Date   Anemia    ASCVD (arteriosclerotic cardiovascular disease)    COPD (chronic obstructive pulmonary disease) (HCC)    Diverticulosis    GERD (gastroesophageal reflux disease)    Hypertension    Hypothyroidism    Tibial fracture 05/03/2023     Assessment: 89 yo female admitted with acute PE and CAP. Patient has PMH of HTN, HLD, GERD, CAD, COPD and anemia. Patient was started on apixaban, last dose 10/25 @ 0830. Pharmacy now consulted for heparin infusion dosing and monitoring for PE due to concern for bleeding.   Goal of Therapy:  Heparin level 0.3-0.7 units/ml aPTT 66-102 seconds Monitor platelets by anticoagulation protocol: Yes   Plan:  Baseline labs ordered. No bolus Start heparin infusion at 1050 units/hr at 2030 this evening (last apixaban dose 0830)  Will need to adjust heparin infusion based of aPTT levels until aPTT and heparin level correlate.  Check anti-Xa level 8 hours after start of infusion and daily while on heparin Continue to monitor H&H and platelets  Estill CHRISTELLA Lutes, PharmD, BCPS Clinical Pharmacist 11/25/2023 1:38 PM

## 2023-11-25 NOTE — Progress Notes (Signed)
 Progress Note   Patient: Terri Braun FMW:[id number] DOB: 1929-10-06 DOA: 11/08/2023     16 DOS: the patient was seen and examined on 11/25/2023   Brief hospital course: Sandria Mcenroe is a 89 y.o. female with medical history significant of hypertension, hyperlipidemia, hypothyroidism, GERD, CAD status post stent, depression, anxiety, alpha 1 antitrypsin, COPD, anemia presenting with cough/shortness of breath.  VQ scan confirmed acute bilateral PE. During hospitalization patient's oxygen requirement worsened and chest imaging showed findings concerning for pneumonia for which patient was initiated on antibiotics. Patient was also having cough with swallowing for which speech therapist was consulted, patient has been cleared to have a meal.  Palliative care also consulted for goals of care discussion.  10/23: TOC working on LTC placement with Hospice 10/24: Hb 6.7 - 1 PRBC transfusion and Lasix 40 mg for K of 5.5, DC tele and transfer to med-surg    Acute pulmonary embolism - transition apixaban to heparin given bleeding concern   Community-acquired pneumonia with hypoxia - on 2 liters but satting well, will trial discontinuing - S/p ceftriaxone  and azithromycin completed - Seen by SLP  Anemia Has required 2 units this hospitalization last on 10/24 with appropriate response. Rn reports small amount of hematochezia. - transition to iv heparin - monitor closely - stop celebrex   Hyponatremia, mild/moderate -likely siadh will check tsh, urine osm, serum osm, urine sod  Hyperkalemia-status post Lokelma - resolved  Urinary retention Once yesterday and again today - I/o cath prn, if fails again will place foley  Hypertension - Continue Isordil . Stop Losartan  due to hyperkalemia  Hyperkalemia Resolved but upper limit of normal - monitor   Hyperlipidemia - Continue home rosuvastatin    GERD - Continue PPI   CAD - Continue home rosuvastatin , anticoagulation as above    Depression Anxiety - Continue home duloxetine  and trazodone   COPD Heterozygous for alpha 1 antitrypsin - Replace home Trelegy with formulary Breztri - Continue as needed albuterol   Sacral pressure  - stage I, present on adm  -- GOC Possibly [facility] with H[facility]ice on Monday. No answer when daughter called today, need to confirm goals of care, current documentation unclear as to whether this is comfort based approach or not   DVT prophylaxis:     heparin  Code Status:              DNR   Disposition Plan: Possibly [facility] with [facility]ice on Monday.     Subjective:  No complaints,   Physical Exam:   Physical Exam  Constitutional: In no distress. Chronically ill appearing Cardiovascular: Normal rate, regular rhythm. No lower extremity edema  Pulmonary: Non labored breathing on [place], no wheezing or rales.   Abdominal: Soft. Non distended and non tender Neurological: awake, not oriented Skin: Skin is warm and dry.         Data reviewed:    Latest Ref Rng & Units 11/25/2023    5:18 AM 11/24/2023    7:05 AM 11/16/2023    6:05 AM  CBC  WBC 4.0 - 10.5 K/uL 8.2  6.7  8.8   Hemoglobin 12.0 - 15.0 g/dL 9.0  6.7  7.8   Hematocrit 36.0 - 46.0 % 27.8  21.2  24.9   Platelets 150 - 400 K/uL 460  402  317        Latest Ref Rng & Units 11/25/2023    5:18 AM 11/24/2023    7:05 AM 11/16/2023    6:05 AM  BMP  Glucose 70 - 99  mg/dL 876  895  890   BUN 8 - 23 mg/dL 36  31  38   Creatinine 0.44 - 1.00 mg/dL 9.21  9.18  8.95   Sodium 135 - 145 mmol/L 129  130  132   Potassium 3.5 - 5.1 mmol/L 5.0  5.5  4.7   Chloride 98 - 111 mmol/L 94  98  101   CO2 22 - 32 mmol/L 23  26  25    Calcium  8.9 - 10.3 mg/dL 8.0  7.8  7.7     Vitals:   11/24/23 1730 11/24/23 2010 11/25/23 0815 11/25/23 1159  BP: (!) 146/51 (!) 133/53 (!) 122/57 (!) 121/47  Pulse: 89 91 87 87  Resp: 20 19 17 17   Temp: 98.2 F (36.8 C) 98.3 F (36.8 C) 98.3 F (36.8 C) 98.5 F (36.9 C)  TempSrc: Axillary      SpO2: 98% 96% 98% 98%  Weight:      Height:         Author: Devaughn KATHEE Ban, MD 11/25/2023 1:23 PM  For on call 7p-7a review [email or web address]

## 2023-11-25 NOTE — Progress Notes (Addendum)
 [facility] Room 257 [facility] Liaison Note   HL team will follow peripherally through discharge disposition.  Daughter will complete paperwork at [facility] on Monday to see if patient will be accepted for admission.  Hospice will follow at the facility.     Please call with any hospice related questions or concerns.   Thank you for the opportunity to participate in this patient's care.     [facility] [phone]

## 2023-11-26 DIAGNOSIS — L8990 Pressure ulcer of unspecified site, unspecified stage: Secondary | ICD-10-CM | POA: Insufficient documentation

## 2023-11-26 DIAGNOSIS — I2699 Other pulmonary embolism without acute cor pulmonale: Secondary | ICD-10-CM | POA: Diagnosis not present

## 2023-11-26 DIAGNOSIS — R338 Other retention of urine: Secondary | ICD-10-CM | POA: Insufficient documentation

## 2023-11-26 LAB — CBC
HCT: 27 % — ABNORMAL LOW (ref 36.0–46.0)
Hemoglobin: 8.7 g/dL — ABNORMAL LOW (ref 12.0–15.0)
MCH: 28.6 pg (ref 26.0–34.0)
MCHC: 32.2 g/dL (ref 30.0–36.0)
MCV: 88.8 fL (ref 80.0–100.0)
Platelets: 458 K/uL — ABNORMAL HIGH (ref 150–400)
RBC: 3.04 MIL/uL — ABNORMAL LOW (ref 3.87–5.11)
RDW: 17.2 % — ABNORMAL HIGH (ref 11.5–15.5)
WBC: 8 K/uL (ref 4.0–10.5)
nRBC: 0 % (ref 0.0–0.2)

## 2023-11-26 LAB — BASIC METABOLIC PANEL WITH GFR
Anion gap: 8 (ref 5–15)
BUN: 39 mg/dL — ABNORMAL HIGH (ref 8–23)
CO2: 23 mmol/L (ref 22–32)
Calcium: 7.9 mg/dL — ABNORMAL LOW (ref 8.9–10.3)
Chloride: 98 mmol/L (ref 98–111)
Creatinine, Ser: 0.86 mg/dL (ref 0.44–1.00)
GFR, Estimated: >60 mL/min (ref >60)
Glucose, Bld: 119 mg/dL — ABNORMAL HIGH (ref 70–99)
Potassium: 5 mmol/L (ref 3.5–5.1)
Sodium: 129 mmol/L — ABNORMAL LOW (ref 135–145)

## 2023-11-26 LAB — APTT
aPTT: 119 s — ABNORMAL HIGH (ref 24–36)
aPTT: 86 s — ABNORMAL HIGH (ref 24–36)
aPTT: 80 s — ABNORMAL HIGH (ref 24–36)

## 2023-11-26 LAB — HEPARIN LEVEL (UNFRACTIONATED): Heparin Unfractionated: >1.1 [IU]/mL — ABNORMAL HIGH (ref 0.30–0.70)

## 2023-11-26 LAB — OSMOLALITY, URINE: Osmolality, Ur: 503 mosm/kg (ref 300–900)

## 2023-11-26 LAB — SODIUM, URINE, RANDOM: Sodium, Ur: <30 mmol/L

## 2023-11-26 LAB — TSH: TSH: 2.459 u[IU]/mL (ref 0.350–4.500)

## 2023-11-26 MED ORDER — CHLORHEXIDINE GLUCONATE CLOTH 2 % EX PADS
6.0000 | MEDICATED_PAD | Freq: Every day | CUTANEOUS | Status: DC
  Administered 2023-11-26 – 2023-11-28 (×3): 6 via TOPICAL

## 2023-11-26 MED ORDER — GLYCERIN (LAXATIVE) 2 G RE SUPP
1.0000 | Freq: Once | RECTAL | Status: AC
  Administered 2023-11-26: 1 via RECTAL
  Filled 2023-11-26: qty 1

## 2023-11-26 MED ORDER — POLYETHYLENE GLYCOL 3350 17 G PO PACK
17.0000 g | PACK | Freq: Every day | ORAL | Status: DC
  Administered 2023-11-26 – 2023-11-28 (×3): 17 g via ORAL
  Filled 2023-11-26 (×3): qty 1

## 2023-11-26 NOTE — Progress Notes (Signed)
 PHARMACY - ANTICOAGULATION CONSULT NOTE  Pharmacy Consult for heparin infusion  Indication: pulmonary embolus  Allergies  Allergen Reactions   Shellfish Allergy Anaphylaxis    Ended up in the ED after eating shellfish, caused diarrhea and severe GI upset , unsure if caused SOB   Shellfish Protein-Containing Drug Products     Other reaction(s): Not available   Sulfa Antibiotics     Patient Measurements: Height: 5' 5 (165.1 cm) Weight: 72.1 kg (158 lb 15.2 oz) IBW/kg (Calculated) : 57 HEPARIN DW (KG): 64.4  Vital Signs: Temp: 98.3 F (36.8 C) (10/26 0859) BP: 142/65 (10/26 0859) Pulse Rate: 109 (10/26 0859)  Labs: Recent Labs    11/24/23 0705 11/25/23 0518 11/25/23 1545 11/26/23 0532 11/26/23 1400  HGB 6.7* 9.0*  --  8.7*  --   HCT 21.2* 27.8*  --  27.0*  --   PLT 402* 460*  --  458*  --   APTT  --   --  53* 119* 86*  LABPROT  --   --  20.6*  --   --   INR  --   --  1.7*  --   --   HEPARINUNFRC  --   --   --  >1.10*  --   CREATININE 0.81 0.78  --  0.86  --     Estimated Creatinine Clearance: 41.5 mL/min (by C-G formula based on SCr of 0.86 mg/dL).   Medical History: Past Medical History:  Diagnosis Date   Anemia    ASCVD (arteriosclerotic cardiovascular disease)    COPD (chronic obstructive pulmonary disease) (HCC)    Diverticulosis    GERD (gastroesophageal reflux disease)    Hypertension    Hypothyroidism    Tibial fracture 05/03/2023     Assessment: 89 yo female admitted with acute PE and CAP. Patient has PMH of HTN, HLD, GERD, CAD, COPD and anemia. Patient was started on apixaban, last dose 10/25 @ 0830. Pharmacy now consulted for heparin infusion dosing and monitoring for PE due to concern for bleeding.   Goal of Therapy:  Heparin level 0.3-0.7 units/ml aPTT 66-102 seconds Monitor platelets by anticoagulation protocol: Yes  Date    Time  aPTT 10/25   1545   53  10/26   0532  119  SUPRAtherapeutic  10/26   1400   86   therapeutic x1 @ 900  units/hr  Plan:  aPTT at goal, therapeutic x1 Recheck aPTT in 8 hours Recheck HL on 10/27 with AM labs Will need to adjust heparin infusion based of aPTT levels until aPTT and heparin level correlate Continue to monitor H&H and platelets  Leonor JAYSON Argyle, PharmD 11/26/2023 2:42 PM

## 2023-11-26 NOTE — Plan of Care (Signed)
  Problem: Education: Goal: Knowledge of General Education information will improve Description: Including pain rating scale, medication(s)/side effects and non-pharmacologic comfort measures Outcome: Progressing   Problem: Health Behavior/Discharge Planning: Goal: Ability to manage health-related needs will improve Outcome: Progressing   Problem: Clinical Measurements: Goal: Ability to maintain clinical measurements within normal limits will improve Outcome: Progressing Goal: Will remain free from infection Outcome: Progressing Goal: Diagnostic test results will improve Outcome: Progressing Goal: Respiratory complications will improve Outcome: Progressing Goal: Cardiovascular complication will be avoided Outcome: Progressing   Problem: Activity: Goal: Risk for activity intolerance will decrease Outcome: Not Progressing   Problem: Nutrition: Goal: Adequate nutrition will be maintained Outcome: Progressing   Problem: Coping: Goal: Level of anxiety will decrease Outcome: Not Progressing   Problem: Elimination: Goal: Will not experience complications related to bowel motility Outcome: Not Progressing Goal: Will not experience complications related to urinary retention Outcome: Not Progressing   Problem: Pain Managment: Goal: General experience of comfort will improve and/or be controlled Outcome: Progressing   Problem: Skin Integrity: Goal: Risk for impaired skin integrity will decrease Outcome: Not Progressing

## 2023-11-26 NOTE — Progress Notes (Signed)
 PHARMACY - ANTICOAGULATION CONSULT NOTE  Pharmacy Consult for heparin infusion  Indication: pulmonary embolus  Allergies  Allergen Reactions   Shellfish Allergy Anaphylaxis    Ended up in the ED after eating shellfish, caused diarrhea and severe GI upset , unsure if caused SOB   Shellfish Protein-Containing Drug Products     Other reaction(s): Not available   Sulfa Antibiotics     Patient Measurements: Height: 5' 5 (165.1 cm) Weight: 64.4 kg (141 lb 15.6 oz) IBW/kg (Calculated) : 57 HEPARIN DW (KG): 64.4  Vital Signs: Temp: 98.5 F (36.9 C) (10/25 2334) BP: 101/50 (10/25 2334) Pulse Rate: 95 (10/25 2334)  Labs: Recent Labs    11/24/23 0705 11/25/23 0518 11/25/23 1545 11/26/23 0532  HGB 6.7* 9.0*  --  8.7*  HCT 21.2* 27.8*  --  27.0*  PLT 402* 460*  --  458*  APTT  --   --  53* 119*  LABPROT  --   --  20.6*  --   INR  --   --  1.7*  --   HEPARINUNFRC  --   --   --  >1.10*  CREATININE 0.81 0.78  --  0.86    Estimated Creatinine Clearance: 37.6 mL/min (by C-G formula based on SCr of 0.86 mg/dL).   Medical History: Past Medical History:  Diagnosis Date   Anemia    ASCVD (arteriosclerotic cardiovascular disease)    COPD (chronic obstructive pulmonary disease) (HCC)    Diverticulosis    GERD (gastroesophageal reflux disease)    Hypertension    Hypothyroidism    Tibial fracture 05/03/2023     Assessment: 89 yo female admitted with acute PE and CAP. Patient has PMH of HTN, HLD, GERD, CAD, COPD and anemia. Patient was started on apixaban, last dose 10/25 @ 0830. Pharmacy now consulted for heparin infusion dosing and monitoring for PE due to concern for bleeding.   Goal of Therapy:  Heparin level 0.3-0.7 units/ml aPTT 66-102 seconds Monitor platelets by anticoagulation protocol: Yes   Plan:  10/26 @ 0532:  aPTT = 119,  HL = > 1.10 - aPTT SUPRAtherapaeutic,  HL elevated from Eliquis PTA - will decrease heparin drip rate to 900 units/hr and recheck aPTT 8  hrs after rate change - recheck HL on 10/27 with AM labs  - Will need to adjust heparin infusion based of aPTT levels until aPTT and heparin level correlate.  - Continue to monitor H&H and platelets  Somalia Segler D, PharmD Clinical Pharmacist 11/26/2023 6:12 AM

## 2023-11-26 NOTE — Plan of Care (Signed)

## 2023-11-26 NOTE — Progress Notes (Signed)
 Progress Note   Patient: Terri Braun FMW:[id number] DOB: Dec 21, 1929 DOA: 11/08/2023     17 DOS: the patient was seen and examined on 11/26/2023   Brief hospital course: Terri Braun is a 89 y.o. female with medical history significant of hypertension, hyperlipidemia, hypothyroidism, GERD, CAD status post stent, depression, anxiety, alpha 1 antitrypsin, COPD, anemia presenting with cough/shortness of breath.  VQ scan confirmed acute bilateral PE. During hospitalization patient's oxygen requirement worsened and chest imaging showed findings concerning for pneumonia for which patient was initiated on antibiotics. Patient was also having cough with swallowing for which speech therapist was consulted, patient has been cleared to have a meal.  Palliative care also consulted for goals of care discussion.  10/23: TOC working on LTC placement with Hospice 10/24: Hb 6.7 - 1 PRBC transfusion and Lasix 40 mg for K of 5.5, DC tele and transfer to med-surg    Acute pulmonary embolism - transition apixaban to heparin given bleeding concern   Community-acquired pneumonia with hypoxia - S/p ceftriaxone  and azithromycin completed - weaned off oxygen - Seen by SLP  Anemia Has required 2 units this hospitalization last on 10/24 with appropriate response. Rn reports small amount of hematochezia on 10/25 - transitioned to iv heparin - no further report of bleeding and hgb stable - stopped celebrex   Hyponatremia, mild/moderate -likely siadh will check tsh, urine osm, serum osm, urine sod, pending  Hyperkalemia -status post Lokelma - resolved but k upper limit of normal  Urinary retention Requiring serial I/o caths, will place foley  Constipation Will add glycerine suppository  Hypertension - Continue Isordil . Stop Losartan  due to hyperkalemia  Hyperkalemia Resolved but upper limit of normal - monitor   Hyperlipidemia - Continue home rosuvastatin   Left proximal fibula fracture,  tibial plateau fracture In April, managed non-operatively, has had decline since then   GERD - Continue PPI   CAD - Continue home rosuvastatin , anticoagulation as above   Depression Anxiety - Continue home duloxetine  and trazodone   COPD Heterozygous for alpha 1 antitrypsin - Replace home Trelegy with formulary Breztri - Continue as needed albuterol   Sacral pressure  - stage I, present on adm  -- GOC Possibly [facility] with [facility]ice on Monday.    DVT prophylaxis:     heparin  Code Status:              DNR   Disposition Plan: Possibly [facility] with H[facility]ice on Monday.     Subjective:  No complaints, stable leg pain  Physical Exam:   Physical Exam  Constitutional: In no distress. Chronically ill appearing Cardiovascular: Normal rate, regular rhythm. No lower extremity edema  Pulmonary: Non labored breathing on [place], no wheezing or rales.   Abdominal: Soft. Non distended and non tender Neurological: awake, not oriented Skin: Skin is warm and dry.         Data reviewed:    Latest Ref Rng & Units 11/26/2023    5:32 AM 11/25/2023    5:18 AM 11/24/2023    7:05 AM  CBC  WBC 4.0 - 10.5 K/uL 8.0  8.2  6.7   Hemoglobin 12.0 - 15.0 g/dL 8.7  9.0  6.7   Hematocrit 36.0 - 46.0 % 27.0  27.8  21.2   Platelets 150 - 400 K/uL 458  460  402        Latest Ref Rng & Units 11/26/2023    5:32 AM 11/25/2023    5:18 AM 11/24/2023    7:05 AM  BMP  Glucose  70 - 99 mg/dL 880  876  895   BUN 8 - 23 mg/dL 39  36  31   Creatinine 0.44 - 1.00 mg/dL 9.13  9.21  9.18   Sodium 135 - 145 mmol/L 129  129  130   Potassium 3.5 - 5.1 mmol/L 5.0  5.0  5.5   Chloride 98 - 111 mmol/L 98  94  98   CO2 22 - 32 mmol/L 23  23  26    Calcium  8.9 - 10.3 mg/dL 7.9  8.0  7.8     Vitals:   11/25/23 1943 11/25/23 2334 11/26/23 0704 11/26/23 0859  BP: (!) 113/52 (!) 101/50 135/60 (!) 142/65  Pulse: 90 95 (!) 107 (!) 109  Resp: 16 18 16 20   Temp: 97.9 F (36.6 C) 98.5 F (36.9 C) 98.1 F (36.7  C) 98.3 F (36.8 C)  TempSrc:      SpO2: 93% 95% 92% 95%  Weight:   72.1 kg   Height:         Author: Devaughn KATHEE Ban, MD 11/26/2023 1:40 PM  For on call 7p-7a review [email or web address].

## 2023-11-26 NOTE — Progress Notes (Addendum)
 [facility] Room 109 [facility] Liaison Note   HL team will follow peripherally through discharge disposition.  Daughter will complete paperwork at [facility] on Monday to see if patient will be accepted for admission.  Hospice will follow at the facility.     Please call with any hospice related questions or concerns.   Thank you for the opportunity to participate in this patient's care.     [facility] [phone]

## 2023-11-26 NOTE — Progress Notes (Signed)
 PHARMACY - ANTICOAGULATION CONSULT NOTE  Pharmacy Consult for heparin infusion  Indication: pulmonary embolus  Allergies  Allergen Reactions   Shellfish Allergy Anaphylaxis    Ended up in the ED after eating shellfish, caused diarrhea and severe GI upset , unsure if caused SOB   Shellfish Protein-Containing Drug Products     Other reaction(s): Not available   Sulfa Antibiotics     Patient Measurements: Height: 5' 5 (165.1 cm) Weight: 72.1 kg (158 lb 15.2 oz) IBW/kg (Calculated) : 57 HEPARIN DW (KG): 64.4  Vital Signs: Temp: 98.1 F (36.7 C) (10/26 1949) BP: 137/56 (10/26 1949) Pulse Rate: 104 (10/26 1949)  Labs: Recent Labs    11/24/23 0705 11/25/23 0518 11/25/23 1545 11/25/23 1545 11/26/23 0532 11/26/23 1400 11/26/23 2240  HGB 6.7* 9.0*  --   --  8.7*  --   --   HCT 21.2* 27.8*  --   --  27.0*  --   --   PLT 402* 460*  --   --  458*  --   --   APTT  --   --  53*   < > 119* 86* 80*  LABPROT  --   --  20.6*  --   --   --   --   INR  --   --  1.7*  --   --   --   --   HEPARINUNFRC  --   --   --   --  >1.10*  --   --   CREATININE 0.81 0.78  --   --  0.86  --   --    < > = values in this interval not displayed.    Estimated Creatinine Clearance: 41.5 mL/min (by C-G formula based on SCr of 0.86 mg/dL).   Medical History: Past Medical History:  Diagnosis Date   Anemia    ASCVD (arteriosclerotic cardiovascular disease)    COPD (chronic obstructive pulmonary disease) (HCC)    Diverticulosis    GERD (gastroesophageal reflux disease)    Hypertension    Hypothyroidism    Tibial fracture 05/03/2023     Assessment: 89 yo female admitted with acute PE and CAP. Patient has PMH of HTN, HLD, GERD, CAD, COPD and anemia. Patient was started on apixaban, last dose 10/25 @ 0830. Pharmacy now consulted for heparin infusion dosing and monitoring for PE due to concern for bleeding.   Goal of Therapy:  Heparin level 0.3-0.7 units/ml aPTT 66-102 seconds Monitor platelets  by anticoagulation protocol: Yes  Date    Time  aPTT 10/25   1545   53  10/26   0532  119  SUPRAtherapeutic  10/26   1400   86   therapeutic x1 @ 900 units/hr 10/26   2240   80   therapeutic X 2 @ 900 units/hr   Plan:  10/26:  aPTT @ 2240 = 80, therapeutic X 2 Recheck aPTT and HL on 10/27 with AM labs Will need to adjust heparin infusion based of aPTT levels until aPTT and heparin level correlate Continue to monitor H&H and platelets  Makaiya Geerdes D, PharmD 11/26/2023 11:21 PM

## 2023-11-27 DIAGNOSIS — I2699 Other pulmonary embolism without acute cor pulmonale: Secondary | ICD-10-CM | POA: Diagnosis not present

## 2023-11-27 DIAGNOSIS — Z7189 Other specified counseling: Secondary | ICD-10-CM | POA: Diagnosis not present

## 2023-11-27 LAB — BASIC METABOLIC PANEL WITH GFR
Anion gap: 7 (ref 5–15)
BUN: 37 mg/dL — ABNORMAL HIGH (ref 8–23)
CO2: 24 mmol/L (ref 22–32)
Calcium: 7.9 mg/dL — ABNORMAL LOW (ref 8.9–10.3)
Chloride: 96 mmol/L — ABNORMAL LOW (ref 98–111)
Creatinine, Ser: 0.71 mg/dL (ref 0.44–1.00)
GFR, Estimated: >60 mL/min (ref >60)
Glucose, Bld: 113 mg/dL — ABNORMAL HIGH (ref 70–99)
Potassium: 4.7 mmol/L (ref 3.5–5.1)
Sodium: 127 mmol/L — ABNORMAL LOW (ref 135–145)

## 2023-11-27 LAB — CBC
HCT: 25.1 % — ABNORMAL LOW (ref 36.0–46.0)
Hemoglobin: 8.2 g/dL — ABNORMAL LOW (ref 12.0–15.0)
MCH: 29.5 pg (ref 26.0–34.0)
MCHC: 32.7 g/dL (ref 30.0–36.0)
MCV: 90.3 fL (ref 80.0–100.0)
Platelets: 477 K/uL — ABNORMAL HIGH (ref 150–400)
RBC: 2.78 MIL/uL — ABNORMAL LOW (ref 3.87–5.11)
RDW: 17.5 % — ABNORMAL HIGH (ref 11.5–15.5)
WBC: 8.5 K/uL (ref 4.0–10.5)
nRBC: 0 % (ref 0.0–0.2)

## 2023-11-27 LAB — HEPARIN LEVEL (UNFRACTIONATED): Heparin Unfractionated: >1.1 [IU]/mL — ABNORMAL HIGH (ref 0.30–0.70)

## 2023-11-27 LAB — APTT: aPTT: 85 s — ABNORMAL HIGH (ref 24–36)

## 2023-11-27 MED ORDER — POLYVINYL ALCOHOL 1.4 % OP SOLN: 1.0000 [drp] | Freq: Four times a day (QID) | OPHTHALMIC | Status: DC | PRN

## 2023-11-27 MED ORDER — SODIUM CHLORIDE 0.9 % IV SOLN: INTRAVENOUS | Status: DC

## 2023-11-27 NOTE — Progress Notes (Signed)
 PHARMACY - ANTICOAGULATION CONSULT NOTE  Pharmacy Consult for heparin infusion  Indication: pulmonary embolus  Allergies  Allergen Reactions   Shellfish Allergy Anaphylaxis    Ended up in the ED after eating shellfish, caused diarrhea and severe GI upset , unsure if caused SOB   Shellfish Protein-Containing Drug Products     Other reaction(s): Not available   Sulfa Antibiotics     Patient Measurements: Height: 5' 5 (165.1 cm) Weight: 72.1 kg (158 lb 15.2 oz) IBW/kg (Calculated) : 57 HEPARIN DW (KG): 64.4  Vital Signs: Temp: 98.1 F (36.7 C) (10/27 0442) BP: 116/46 (10/27 0442) Pulse Rate: 90 (10/27 0442)  Labs: Recent Labs    11/25/23 0518 11/25/23 0518 11/25/23 1545 11/26/23 0532 11/26/23 1400 11/26/23 2240 11/27/23 0359  HGB 9.0*  --   --  8.7*  --   --  8.2*  HCT 27.8*  --   --  27.0*  --   --  25.1*  PLT 460*  --   --  458*  --   --  477*  APTT  --    < > 53* 119* 86* 80* 85*  LABPROT  --   --  20.6*  --   --   --   --   INR  --   --  1.7*  --   --   --   --   HEPARINUNFRC  --   --   --  >1.10*  --   --  >1.10*  CREATININE 0.78  --   --  0.86  --   --  0.71   < > = values in this interval not displayed.    Estimated Creatinine Clearance: 44.6 mL/min (by C-G formula based on SCr of 0.71 mg/dL).   Medical History: Past Medical History:  Diagnosis Date   Anemia    ASCVD (arteriosclerotic cardiovascular disease)    COPD (chronic obstructive pulmonary disease) (HCC)    Diverticulosis    GERD (gastroesophageal reflux disease)    Hypertension    Hypothyroidism    Tibial fracture 05/03/2023     Assessment: 89 yo female admitted with acute PE and CAP. Patient has PMH of HTN, HLD, GERD, CAD, COPD and anemia. Patient was started on apixaban, last dose 10/25 @ 0830. Pharmacy now consulted for heparin infusion dosing and monitoring for PE due to concern for bleeding.   Goal of Therapy:  Heparin level 0.3-0.7 units/ml aPTT 66-102 seconds Monitor platelets  by anticoagulation protocol: Yes  Date    Time  aPTT 10/25   1545   53  10/26   0532  119  SUPRAtherapeutic  10/26   1400   86   therapeutic x1 @ 900 units/hr 10/26   2240   80   therapeutic X 2 @ 900 units/hr  10/27   0359   85   therapeutic X 3 @ 900 units/hr   Plan:  10/27 @ 0359:  aPTT = 85,  HL = > 1.10 - aPTT therapeutic X 3,  HL elevated from Eliquis PTA Continue pt on current rate and recheck aPTT and HL on 10/28 with AM labs Will need to adjust heparin infusion based of aPTT levels until aPTT and heparin level correlate Continue to monitor H&H and platelets  Carlyon Nolasco D, PharmD 11/27/2023 5:30 AM

## 2023-11-27 NOTE — TOC Progression Note (Addendum)
 Transition of Care [facility]) - Progression Note    Patient Details  Name: Terri Braun MRN: [id number] Date of Birth: November 02, 1929  [facility] CM/SW Contact  Dalia GORMAN Fuse, RN Phone Number: 11/27/2023, 2:24 PM  Clinical Narrative:     Patient's daughter was planning to meet with someone from the business office at [facility] this morning to make payment arrangements.  TOC LVMM with the patient's daughter requesting call back.  TOC called to [facility] and was transferred to [place] with no option to lvmm  15:25: TOC spoke with Sonny at [facility], they can accept the patient tomorrow for LTC.  TOC will continue to follow.                    Expected Discharge Plan and Services                                               Social Drivers of Health (SDOH) Interventions SDOH Screenings   Food Insecurity: No Food Insecurity (11/10/2023)  Housing: Low Risk  (11/10/2023)  Transportation Needs: No Transportation Needs (11/10/2023)  Utilities: Not At Risk (11/10/2023)  Alcohol Screen: Low Risk  (09/29/2021)  Depression (PHQ2-9): Low Risk  (09/29/2021)  Financial Resource Strain: Low Risk  (08/11/2023)   Received from [facility]  Physical Activity: Insufficiently Active (09/29/2021)  Social Connections: Moderately Isolated (11/10/2023)  Stress: No Stress Concern Present (09/29/2021)  Tobacco Use: Low Risk  (11/08/2023)    Readmission Risk Interventions     No data to display

## 2023-11-27 NOTE — Plan of Care (Signed)
   Problem: Nutrition: Goal: Adequate nutrition will be maintained Outcome: Progressing   Problem: Pain Managment: Goal: General experience of comfort will improve and/or be controlled Outcome: Progressing   Problem: Safety: Goal: Ability to remain free from injury will improve Outcome: Progressing

## 2023-11-27 NOTE — Progress Notes (Addendum)
 [facility] Room 109 [facility] Liaison Note   HL team will follow peripherally through discharge disposition. If patient requires SNF rehab, [facility] will provide palliative care follow up at the SNF.  If patient requires LTC, [facility] will provide hospice follow up at the LTC facility. Referral submitted on 10.22.25.   Please call with any hospice or palliative care related questions or concerns.   Thank you for the opportunity to participate in this patient's care.     [facility] [phone]

## 2023-11-27 NOTE — Plan of Care (Signed)
  Problem: Education: Goal: Knowledge of General Education information will improve Description: Including pain rating scale, medication(s)/side effects and non-pharmacologic comfort measures Outcome: Progressing   Problem: Health Behavior/Discharge Planning: Goal: Ability to manage health-related needs will improve Outcome: Progressing   Problem: Clinical Measurements: Goal: Ability to maintain clinical measurements within normal limits will improve Outcome: Progressing Goal: Will remain free from infection Outcome: Progressing Goal: Diagnostic test results will improve Outcome: Progressing Goal: Respiratory complications will improve Outcome: Progressing Goal: Cardiovascular complication will be avoided Outcome: Progressing   Problem: Activity: Goal: Risk for activity intolerance will decrease Outcome: Not Progressing   Problem: Nutrition: Goal: Adequate nutrition will be maintained Outcome: Progressing   Problem: Coping: Goal: Level of anxiety will decrease Outcome: Progressing   Problem: Elimination: Goal: Will not experience complications related to bowel motility Outcome: Progressing Goal: Will not experience complications related to urinary retention Outcome: Not Progressing   Problem: Pain Managment: Goal: General experience of comfort will improve and/or be controlled Outcome: Progressing   Problem: Safety: Goal: Ability to remain free from injury will improve Outcome: Progressing   Problem: Skin Integrity: Goal: Risk for impaired skin integrity will decrease Outcome: Progressing

## 2023-11-27 NOTE — Progress Notes (Addendum)
 Daily Progress Note   Patient Name: Terri Braun       Date: 11/27/2023 DOB: 1929-11-08  Age: 89 y.o. MRN#: [id number] Attending Physician: Kandis Devaughn Sayres, MD Primary Care Physician: Lenon Layman ORN, MD Admit Date: 11/08/2023  Reason for Consultation/Follow-up: Establishing goals of care  GOC: 10/15  Subjective: Notes reviewed.  Patient has been awaiting LTC placement with hospice to follow, and TOC has been assisting with this. Epic chat per attending inquiring about patient's status.  Discussed the goals of care conversation had with patient and family on 10/15.  Patient currently DNR, comfort care.   A MOST form is scanned under the ACP tab on 10/15 which was completed for   Cardiopulmonary Resuscitation: Do Not Attempt Resuscitation (DNR/No CPR)  Medical Interventions: Comfort Measures: Keep clean, warm, and dry. Use medication by any route, positioning, wound care, and other measures to relieve pain and suffering. Use oxygen, suction and manual treatment of airway obstruction as needed for comfort. Do not transfer to the hospital unless comfort needs cannot be met in current location.  Antibiotics: No antibiotics (use other measures to relieve symptoms)  IV Fluids: No IV fluids (provide other measures to ensure comfort)  Feeding Tube: No feeding tube     In to see patient, she is eating lunch. She states she has no complaints at this time.  She confirms desire for no further life-prolonging care.  Attempted to call daughter unsuccessfully.  Per most recent notes patient will be discharging tomorrow to [facility].   Length of Stay: 18  Current Medications: Scheduled Meds:   budesonide-glycopyrrolate-formoterol  2 puff Inhalation BID   Chlorhexidine Gluconate Cloth  6 each  Topical Daily   guaiFENesin  15 mL Oral Q6H   And   dextromethorphan  30 mg Oral Q12H   DULoxetine   60 mg Oral Daily   feeding supplement  237 mL Oral BID BM   isosorbide  dinitrate  10 mg Oral TID   omeprazole  20 mg Oral Daily   mouth rinse  15 mL Mouth Rinse 4 times per day   polyethylene glycol  17 g Oral Daily   sodium chloride  flush  3 mL Intravenous Q12H   traZODone  100 mg Oral QHS    Continuous Infusions:  sodium chloride       PRN Meds: acetaminophen  **  OR** acetaminophen , albuterol , artificial tears, HYDROcodone -acetaminophen , LORazepam, mouth rinse  Physical Exam Pulmonary:     Effort: Pulmonary effort is normal.  Skin:    General: Skin is warm and dry.  Neurological:     Mental Status: She is alert.             Vital Signs: BP (!) 127/49 (BP Location: Left Arm)   Pulse 97   Temp 99.7 F (37.6 C) (Oral)   Resp 18   Ht 5' 5 (1.651 m)   Wt 72.1 kg   SpO2 93%   BMI 26.45 kg/m  SpO2: SpO2: 93 % O2 Device: O2 Device: Room Air O2 Flow Rate: O2 Flow Rate (L/min): 2 L/min  Intake/output summary:  Intake/Output Summary (Last 24 hours) at 11/27/2023 1550 Last data filed at 11/27/2023 0920 Gross per 24 hour  Intake 3 ml  Output 750 ml  Net -747 ml   LBM: Last BM Date : 11/26/23 Baseline Weight: Weight: 64.4 kg Most recent weight: Weight: 72.1 kg     Patient Active Problem List   Diagnosis Date Noted   Acute urinary retention 11/26/2023   Pressure injury of skin 11/26/2023   Goals of care, counseling/discussion 11/15/2023   Acute pulmonary embolism (HCC) 11/08/2023   Frequent falls 05/12/2023   Closed fracture of proximal end of left fibula 05/05/2023   Closed nondisplaced oblique fracture of shaft of left tibia 05/05/2023   UTI (urinary tract infection) 05/04/2023   COPD (chronic obstructive pulmonary disease) (HCC)    Tibial plateau fracture 05/03/2023   Moderate persistent asthma without complication 03/10/2022   Olecranon bursitis of right  elbow 08/23/2021   Leg hematoma, left, initial encounter 05/03/2021   Syncope 05/03/2021   Osteoarthritis of left knee 04/12/2021   Leukocytes in urine 12/13/2019   Urinary frequency 12/13/2019   Closed fracture of metatarsal bone 12/25/2018   Polyp of ascending colon 02/16/2018   Diverticula, colon 02/16/2018   Hypothyroidism 02/16/2018   Essential hypertension, benign 02/16/2018   ASCVD (arteriosclerotic cardiovascular disease) 02/16/2018   Periumbilical abdominal pain 02/16/2018   GI bleed 02/16/2018   Iron deficiency anemia, unspecified 02/16/2018   Chronic liver disease 02/16/2018   Carrier of alpha-1-antitrypsin deficiency 02/16/2018   History of colonic polyps 02/16/2018   Abnormal liver CT 02/16/2018   Elevated liver enzymes 02/16/2018   History of hepatitis C 02/16/2018   Abnormal findings on imaging test 02/16/2018   Helicobacter pylori gastritis 02/16/2018   Gait instability 02/13/2018   Mixed hyperlipidemia 02/13/2018   Atherosclerosis of native coronary artery of native heart with stable angina pectoris 02/12/2018   Centrilobular emphysema (HCC) 02/12/2018   Anemia 02/12/2018   Benign neoplasm of cecum 01/16/2018   Heterozygous alpha 1-antitrypsin deficiency (HCC) 01/16/2018   GERD (gastroesophageal reflux disease) 01/16/2018   Bronchiectasis without complication (HCC) 05/16/2016   Moderate episode of recurrent major depressive disorder (HCC) 04/14/2016   Bloody stool 09/15/2015   Left lower quadrant pain 09/15/2015   Long term (current) use of antithrombotics/antiplatelets 09/15/2015   Dyspnea 05/18/2015   Anxiety 11/14/2011   DJD (degenerative joint disease) 11/14/2011   Abnormal stress test 04/20/2010   Status post coronary artery stent placement 04/20/2010   CAD (coronary artery disease) 04/20/2010    Palliative Care Assessment & Plan   Recommendations/Plan:  Plans in place to discharge to [facility] tomorrow.  [facility] following and will follow  patient at discharge.  Code Status:    Code Status Orders  (From admission, onward)  Start     Ordered   11/27/23 1500  Do not attempt resuscitation (DNR) - Comfort care  Continuous       Question Answer Comment  If patient has no pulse and is not breathing Do Not Attempt Resuscitation   In Pre-Arrest Conditions (Patient Is Breathing and Has a Pulse) Provide comfort measures. Relieve any mechanical airway obstruction. Avoid transfer unless required for comfort.   Consent: Discussion documented in EHR or advanced directives reviewed      11/27/23 1459           Code Status History     Date Active Date Inactive Code Status Order ID Comments User Context   11/15/2023 1721 11/27/2023 1459 Do not attempt resuscitation (DNR) - Comfort care [id number]  Signa Salines, NP Inpatient   11/08/2023 1743 11/15/2023 1721 Full Code [id number]  Seena Marsa NOVAK, MD ED   05/03/2023 0840 05/11/2023 1834 Full Code [id number]  Laurita Cort DASEN, MD ED      Advance Directive Documentation    Flowsheet Row Most Recent Value  Type of Advance Directive Living will  Pre-existing out of facility DNR order (yellow form or pink MOST form) --  MOST Form in Place? --    Salines Signa, NP  Please contact Palliative Medicine Team phone at [phone] for questions and concerns.

## 2023-11-27 NOTE — Progress Notes (Signed)
 Physical Therapy Treatment Patient Details Name: Terri Braun MRN: [id number] DOB: [birth year] Today's Date: 11/27/2023   History of Present Illness Pt is a 89 y.o. female with medical history significant of hypertension, hyperlipidemia, hypothyroidism, GERD, CAD status post stent, depression, anxiety, alpha 1 antitrypsin, COPD, anemia presenting with cough/shortness of breath and found to have acute PE.    PT Comments  Pt alert and agreeable to participate in PT tx this date. Pt was received in bed, able to perform gently BLE exercises with fair tolerance, minimal AROM of BLE. Pt able to initiate rolling with LUE reaching to bed rail with max verbal and tactile cues for hand placement and sequencing. MaxAx2 for supine <> sit transfer. Pt able to tolerate sitting EOB for ~5 minutes during session with mod-maxAx 1-2 and performed multiple reps of seated anterior reaching outside BOS, noted to demonstrate minimal active trunk flexion and rather performed reaching from scapula. Pt was left supine in bed at end of session, all needs in reach. The patient would benefit from further skilled PT intervention to continue to progress towards goals.     If plan is discharge home, recommend the following: Two people to help with walking and/or transfers;A lot of help with bathing/dressing/bathroom;Assist for transportation;Direct supervision/assist for medications management;Direct supervision/assist for financial management;Assistance with cooking/housework   Can travel by private vehicle     No  Equipment Recommendations  Other (comment) (TBD)    Recommendations for Other Services       Precautions / Restrictions Precautions Precautions: Fall Recall of Precautions/Restrictions: Intact Restrictions Weight Bearing Restrictions Per Provider Order: No     Mobility  Bed Mobility Overal bed mobility: Needs Assistance Bed Mobility: Rolling, Supine to Sit, Sit to Supine Rolling: Max assist, Used  rails   Supine to sit: Max assist, +2 for physical assistance Sit to supine: Max assist, +2 for physical assistance   General bed mobility comments: Pt able to participate in rolling with LUE with verbal/tactile cues for sequencing and hand placement. Heavy +2 for supine <> sit transfers    Transfers                   General transfer comment: NT this session due to pt's poor sitting balance and inability to anterior weight shift in sitting    Ambulation/Gait                   Stairs             Wheelchair Mobility     Tilt Bed    Modified Rankin (Stroke Patients Only)       Balance Overall balance assessment: Needs assistance Sitting-balance support: Feet supported, Bilateral upper extremity supported, Single extremity supported Sitting balance-Leahy Scale: Poor Sitting balance - Comments: required mod-maxA x1-2 to maintain sitting EOB Postural control: Posterior lean                                  Communication Communication Communication: Impaired Factors Affecting Communication: Hearing impaired  Cognition Arousal: Alert Behavior During Therapy: WFL for tasks assessed/performed   PT - Cognitive impairments: No family/caregiver present to determine baseline   Orientation impairments: Place, Time, Situation                   PT - Cognition Comments: alert and agreeable to participate, perseverating on plan of care written on white board in room Following commands: Impaired Following  commands impaired: Follows one step commands with increased time    Cueing Cueing Techniques: Verbal cues, Tactile cues, Visual cues  Exercises General Exercises - Lower Extremity Ankle Circles/Pumps: AROM, Both, 10 reps, Supine Quad Sets: AROM, Both, 5 reps, Supine Short Arc Quad: AROM, Both, 5 reps, Supine Other Exercises Other Exercises: Pt performed multiple reps of seated reaching outside BOS to improve anterior weight  shift    General Comments        Pertinent Vitals/Pain Pain Assessment Pain Assessment: Faces Faces Pain Scale: Hurts little more Pain Location: bilateral feet Pain Descriptors / Indicators: Grimacing, Sore Pain Intervention(s): Limited activity within patient's tolerance, Monitored during session, Repositioned    Home Living                          Prior Function            PT Goals (current goals can now be found in the care plan section) Progress towards PT goals: Progressing toward goals;Goals updated    Frequency    Min 2X/week      PT Plan      Co-evaluation              AM-PAC PT 6 Clicks Mobility   Outcome Measure  Help needed turning from your back to your side while in a flat bed without using bedrails?: A Lot Help needed moving from lying on your back to sitting on the side of a flat bed without using bedrails?: A Lot Help needed moving to and from a bed to a chair (including a wheelchair)?: Total Help needed standing up from a chair using your arms (e.g., wheelchair or bedside chair)?: Total Help needed to walk in hospital room?: Total Help needed climbing 3-5 steps with a railing? : Total 6 Click Score: 8    End of Session   Activity Tolerance: Patient limited by fatigue Patient left: in bed;with call bell/phone within reach;with bed alarm set (Prevalon boots reapplied) Nurse Communication: Mobility status PT Visit Diagnosis: Other abnormalities of gait and mobility (R26.89);Muscle weakness (generalized) (M62.81);Difficulty in walking, not elsewhere classified (R26.2)     Time: 8972-8951 PT Time Calculation (min) (ACUTE ONLY): 21 min  Charges:    $Therapeutic Activity: 8-22 mins PT General Charges $$ ACUTE PT VISIT: 1 Visit                     Bristol Soy, SPT

## 2023-11-27 NOTE — Progress Notes (Signed)
 Progress Note   Patient: Doll Frazee FMW:[id number] DOB: 1929-12-18 DOA: 11/08/2023     18 DOS: the patient was seen and examined on 11/27/2023   Brief hospital course: Rilee Knoll is a 89 y.o. female with medical history significant of hypertension, hyperlipidemia, hypothyroidism, GERD, CAD status post stent, depression, anxiety, alpha 1 antitrypsin, COPD, anemia presenting with cough/shortness of breath.  VQ scan confirmed acute bilateral PE. During hospitalization patient's oxygen requirement worsened and chest imaging showed findings concerning for pneumonia for which patient was initiated on antibiotics. Patient was also having cough with swallowing for which speech therapist was consulted, patient has been cleared to have a meal.  Palliative care also consulted for goals of care discussion.  10/23: TOC working on LTC placement with Hospice 10/24: Hb 6.7 - 1 PRBC transfusion and Lasix 40 mg for K of 5.5, DC tele and transfer to med-surg    Acute pulmonary embolism Confirmed with palliative and patient today that goals of care are comfort and not extension of life, will therefore discontinue anticoagulation, patient in agreement   Community-acquired pneumonia with hypoxia - S/p ceftriaxone  and azithromycin completed - weaned off oxygen - Seen by SLP  Anemia Has required 2 units this hospitalization last on 10/24 with appropriate response. Rn reports small amount of hematochezia on 10/25 - d/c anticoagulation as above  Hyponatremia, mild/moderate Labs suggest hypovolemia may be playing a role though suspect siadh as well  Hyperkalemia Treated with lokelma, resolved, now holding on additional monitoring  Urinary retention Requiring serial I/o caths, Foley placed 10/26  Left proximal fibula fracture, tibial plateau fracture In April, managed non-operatively, has had decline since then   CAD asymptomatic   Depression Anxiety - Continue home duloxetine  and trazodone    COPD Heterozygous for alpha 1 antitrypsin - Replace home Trelegy with formulary Breztri - Continue as needed albuterol   Sacral pressure  - stage I, present on adm  -- GOC Daughter meeting with [facility] today    DVT prophylaxis:     none  Code Status:              DNR/DNI   Disposition Plan: long-term care vs home with hospice     Subjective:  No complaints, stable leg pain  Physical Exam:   Physical Exam  Constitutional: In no distress. Chronically ill appearing Cardiovascular: Normal rate, regular rhythm. No lower extremity edema  Pulmonary: Non labored breathing on [place], no wheezing or rales.   Abdominal: Soft. Non distended and non tender Neurological: awake, not oriented Skin: Skin is warm and dry.         Data reviewed:    Latest Ref Rng & Units 11/27/2023    3:59 AM 11/26/2023    5:32 AM 11/25/2023    5:18 AM  CBC  WBC 4.0 - 10.5 K/uL 8.5  8.0  8.2   Hemoglobin 12.0 - 15.0 g/dL 8.2  8.7  9.0   Hematocrit 36.0 - 46.0 % 25.1  27.0  27.8   Platelets 150 - 400 K/uL 477  458  460        Latest Ref Rng & Units 11/27/2023    3:59 AM 11/26/2023    5:32 AM 11/25/2023    5:18 AM  BMP  Glucose 70 - 99 mg/dL 886  880  876   BUN 8 - 23 mg/dL 37  39  36   Creatinine 0.44 - 1.00 mg/dL 9.28  9.13  9.21   Sodium 135 - 145 mmol/L 127  129  129   Potassium 3.5 - 5.1 mmol/L 4.7  5.0  5.0   Chloride 98 - 111 mmol/L 96  98  94   CO2 22 - 32 mmol/L 24  23  23    Calcium  8.9 - 10.3 mg/dL 7.9  7.9  8.0     Vitals:   11/26/23 1601 11/26/23 1949 11/27/23 0442 11/27/23 0855  BP: (!) 133/49 (!) 137/56 (!) 116/46 (!) 127/49  Pulse: (!) 101 (!) 104 90 97  Resp: 20   18  Temp:  98.1 F (36.7 C) 98.1 F (36.7 C) 99.7 F (37.6 C)  TempSrc:    Oral  SpO2: 98% 92% 94% 93%  Weight:      Height:         Author: Devaughn KATHEE Ban, MD 11/27/2023 2:59 PM  For on call 7p-7a review [email or web address].

## 2023-11-28 ENCOUNTER — Ambulatory Visit: Admitting: Pulmonary Disease

## 2023-11-28 DIAGNOSIS — I2699 Other pulmonary embolism without acute cor pulmonale: Secondary | ICD-10-CM | POA: Diagnosis not present

## 2023-11-28 MED ORDER — LORAZEPAM 0.5 MG PO TABS: 0.2500 mg | ORAL_TABLET | ORAL | 0 refills | Status: DC | PRN

## 2023-11-28 MED ORDER — HYDROCODONE-ACETAMINOPHEN 5-325 MG PO TABS: 1.0000 | ORAL_TABLET | Freq: Four times a day (QID) | ORAL | 0 refills | Status: DC | PRN

## 2023-11-28 NOTE — Discharge Summary (Signed)
 Terri Braun FMW:[id number] DOB: May 17, 1929 DOA: 11/08/2023  PCP: Lenon Layman ORN, MD  Admit date: 11/08/2023 Discharge date: 11/28/2023  Time spent: 35 minutes    Discharge Diagnoses:  Principal Problem:   Acute pulmonary embolism (HCC) Active Problems:   COPD (chronic obstructive pulmonary disease) (HCC)   CAD (coronary artery disease)   GERD (gastroesophageal reflux disease)   Anemia   Mixed hyperlipidemia   Hypothyroidism   Essential hypertension, benign   Anxiety   Moderate episode of recurrent major depressive disorder (HCC)   Status post coronary artery stent placement   Goals of care, counseling/discussion   Acute urinary retention   Pressure injury of skin   Discharge Condition: stable  Diet recommendation: dysphagia 2 (can consider liberalizing given comfort care)  Filed Weights   11/08/23 1622 11/26/23 0704  Weight: 64.4 kg 72.1 kg    History of present illness:  From admission h and p Terri Braun is a 89 y.o. female with medical history significant of hypertension, hyperlipidemia, hypothyroidism, GERD, CAD status post stent, depression, anxiety, alpha 1 antitrypsin, COPD, anemia presenting with cough/shortness of breath/PE.   Patient has had some cough and shortness of breath though similar to her baseline shortness of breath for the past 2 to 3 weeks.  Saw pulmonologist yesterday and had a chest x-ray and labs done.  Labs showed elevated D-dimer and patient went for VQ scan today which was positive bilaterally.  DVT study also ordered which was negative.  Patient sent to the ED for PE.   Patient denies fevers, chills, chest pain, abdominal pain, constipation, diarrhea, nausea, vomiting.  Hospital Course:   Acute pulmonary embolism Diagnosed herre with vq scan. Confirmed with palliative and patient that goals of care are comfort and not extension of life, have discontinued anticoagulation,    Community-acquired pneumonia with hypoxia - S/p  ceftriaxone  and azithromycin completed - weaned off oxygen  Anemia Has required 2 units this hospitalization last on 10/24 with appropriate response. Rn reports small amount of hematochezia on 10/25 - d/c anticoagulation as above   Hyponatremia, mild/moderate Labs suggest hypovolemia may be playing a role though suspect siadh as well   Hyperkalemia Treated with lokelma, resolved    Urinary retention Requiring serial I/o caths, Foley placed 10/26, will maintain   Left proximal fibula fracture, tibial plateau fracture In April, managed non-operatively, has had decline since then   CAD asymptomatic   Depression Anxiety - Continue home duloxetine  and trazodone   COPD Heterozygous for alpha 1 antitrypsin - home Trelegy   Sacral pressure  - stage I, present on adm   -- GOC Comfort care, going to long term care with hospice following  Procedures: none   Consultations: palliative  Discharge Exam: Vitals:   11/28/23 0418 11/28/23 0741  BP: (!) 130/52 (!) 142/53  Pulse: 91 90  Resp: 18 20  Temp: 98.6 F (37 C) 99 F (37.2 C)  SpO2: 93% 95%    Constitutional: In no distress. Chronically ill appearing Cardiovascular: Normal rate, regular rhythm. No lower extremity edema  Pulmonary: Non labored breathing on [place], no wheezing or rales.   Abdominal: Soft. Non distended and non tender Neurological: awake, not oriented Skin: Skin is warm and dry.   Discharge Instructions   Discharge Instructions     Diet - low sodium heart healthy   Complete by: As directed    Increase activity slowly   Complete by: As directed    No wound care   Complete by: As directed  Allergies as of 11/28/2023       Reactions   Shellfish Allergy Anaphylaxis   Ended up in the ED after eating shellfish, caused diarrhea and severe GI upset , unsure if caused SOB   Shellfish Protein-containing Drug Products    Other reaction(s): Not available   Sulfa Antibiotics          Medication List     STOP taking these medications    alendronate 70 MG tablet Commonly known as: FOSAMAX   azithromycin 250 MG tablet Commonly known as: Zithromax   celecoxib  100 MG capsule Commonly known as: CELEBREX    cholecalciferol 25 MCG (1000 UNIT) tablet Commonly known as: VITAMIN D3   clopidogrel  75 MG tablet Commonly known as: PLAVIX    dextromethorphan-guaiFENesin 10-100 MG/5ML liquid Commonly known as: ROBITUSSIN-DM   feeding supplement Liqd   losartan  50 MG tablet Commonly known as: COZAAR    multivitamin with minerals Tabs tablet   rosuvastatin  10 MG tablet Commonly known as: CRESTOR        TAKE these medications    albuterol  108 (90 Base) MCG/ACT inhaler Commonly known as: VENTOLIN  HFA TAKE 2 PUFFS BY MOUTH EVERY 6 HOURS AS NEEDED FOR WHEEZE OR SHORTNESS OF BREATH   bisacodyl  5 MG EC tablet Commonly known as: DULCOLAX Take 1 tablet (5 mg total) by mouth daily as needed for moderate constipation.   DULoxetine  60 MG capsule Commonly known as: CYMBALTA  Take 60 mg by mouth daily.   HYDROcodone -acetaminophen  5-325 MG tablet Commonly known as: NORCO/VICODIN Take 1-2 tablets by mouth every 6 (six) hours as needed for moderate pain (pain score 4-6).   isosorbide  mononitrate 30 MG 24 hr tablet Commonly known as: IMDUR  Take 1 tablet (30 mg total) by mouth daily.   lidocaine  5 % Commonly known as: LIDODERM  Place 1 patch onto the skin daily.   LORazepam 0.5 MG tablet Commonly known as: ATIVAN Take 0.5-1 tablets (0.25-0.5 mg total) by mouth every 4 (four) hours as needed for anxiety.   nitroGLYCERIN  0.4 MG SL tablet Commonly known as: NITROSTAT  Place 1 tablet (0.4 mg total) under the tongue every 5 (five) minutes as needed for chest pain.   pantoprazole  20 MG tablet Commonly known as: PROTONIX  Take 20 mg by mouth daily.   polyethylene glycol 17 g packet Commonly known as: MIRALAX  / GLYCOLAX  Take 17 g by mouth 2 (two) times daily.    traZODone 100 MG tablet Commonly known as: DESYREL Take 100 mg by mouth at bedtime.   Trelegy Ellipta  100-62.5-25 MCG/ACT Aepb Generic drug: Fluticasone -Umeclidin-Vilant TAKE 1 PUFF BY MOUTH EVERY DAY       Allergies  Allergen Reactions   Shellfish Allergy Anaphylaxis    Ended up in the ED after eating shellfish, caused diarrhea and severe GI upset , unsure if caused SOB   Shellfish Protein-Containing Drug Products     Other reaction(s): Not available   Sulfa Antibiotics     Contact information for after-discharge care     Destination     [facility] .   Service: Skilled Nursing Contact information: [address] [place]  [address] [phone]                      The results of significant diagnostics from this hospitalization (including imaging, microbiology, ancillary and laboratory) are listed below for reference.    Significant Diagnostic Studies: DG Chest Port 1 View Result Date: 11/12/2023 CLINICAL DATA:  10031 Cough 10031 EXAM: PORTABLE CHEST  1 VIEW COMPARISON:  11/08/2023. FINDINGS: There is at least moderate sized pleural effusion, extending across the left lateral hemithorax, which may suggest underlying loculation versus decubitus imaging. Re-demonstration of left retrocardiac airspace opacity obscuring the left hemidiaphragm and descending thoracic aorta, suggesting combination of left lung atelectasis and/or consolidation. Bilateral lung fields are otherwise grossly clear. Right lateral costophrenic angle is clear. No pneumothorax on either side. Stable cardio-mediastinal silhouette. No acute osseous abnormalities. The soft tissues are within normal limits. IMPRESSION: 1. At least moderate sized left pleural effusion, which may suggest underlying loculations versus decubitus imaging. 2. Left retrocardiac airspace opacity, suggesting combination of left lung atelectasis and/or consolidation. Electronically Signed    By: Ree Molt M.D.   On: 11/12/2023 09:24   ECHOCARDIOGRAM COMPLETE Result Date: 11/09/2023    ECHOCARDIOGRAM REPORT   Patient Name:   [place] DOST Date of Exam: 11/09/2023 Medical Rec #:  [id number]      Height:       65.0 in Accession #:    [id number]     Weight:       142.0 lb Date of Birth:  1929-03-08      BSA:          1.710 m Patient Age:    89 years       BP:           143/55 mmHg Patient Gender: F              HR:           92 bpm. Exam Location:  [facility] Procedure: 2D Echo, Cardiac Doppler and Color Doppler (Both Spectral and Color            Flow Doppler were utilized during procedure). Indications:     Pulmonary embolus I26.09  History:         Patient has prior history of Echocardiogram examinations, most                  recent 04/27/2022. COPD; Risk Factors:Hypertension.  Sonographer:     Christopher Furnace Referring Phys:  [id number] MARSA NOVAK MELVIN Diagnosing Phys: Evalene Lunger MD  Sonographer Comments: Suboptimal parasternal window. Image acquisition challenging due to COPD. IMPRESSIONS  1. Left ventricular ejection fraction, by estimation, is 60 to 65%. The left ventricle has normal function. The left ventricle has no regional wall motion abnormalities. Left ventricular diastolic parameters are consistent with Grade I diastolic dysfunction (impaired relaxation).  2. Right ventricular systolic function is normal. The right ventricular size is normal. There is normal pulmonary artery systolic pressure.  3. The mitral valve is normal in structure. No evidence of mitral valve regurgitation. No evidence of mitral stenosis.  4. The aortic valve is normal in structure. Aortic valve regurgitation is mild. Aortic valve sclerosis is present, with no evidence of aortic valve stenosis.  5. The inferior vena cava is normal in size with greater than 50% respiratory variability, suggesting right atrial pressure of 3 mmHg. FINDINGS  Left Ventricle: Left ventricular ejection fraction, by estimation, is 60 to 65%.  The left ventricle has normal function. The left ventricle has no regional wall motion abnormalities. Strain was performed and the global longitudinal strain is indeterminate. The left ventricular internal cavity size was normal in size. There is no left ventricular hypertrophy. Left ventricular diastolic parameters are consistent with Grade I diastolic dysfunction (impaired relaxation). Right Ventricle: The right ventricular size is normal. No increase in right ventricular wall thickness. Right ventricular systolic function is normal.  There is normal pulmonary artery systolic pressure. The tricuspid regurgitant velocity is 2.55 m/s, and  with an assumed right atrial pressure of 5 mmHg, the estimated right ventricular systolic pressure is 31.0 mmHg. Left Atrium: Left atrial size was normal in size. Right Atrium: Right atrial size was normal in size. Pericardium: There is no evidence of pericardial effusion. Mitral Valve: The mitral valve is normal in structure. No evidence of mitral valve regurgitation. No evidence of mitral valve stenosis. MV peak gradient, 8.0 mmHg. The mean mitral valve gradient is 4.0 mmHg. Tricuspid Valve: The tricuspid valve is normal in structure. Tricuspid valve regurgitation is not demonstrated. No evidence of tricuspid stenosis. Aortic Valve: The aortic valve is normal in structure. Aortic valve regurgitation is mild. Aortic valve sclerosis is present, with no evidence of aortic valve stenosis. Aortic valve mean gradient measures 9.5 mmHg. Aortic valve peak gradient measures 15.4 mmHg. Aortic valve area, by VTI measures 3.81 cm. Pulmonic Valve: The pulmonic valve was normal in structure. Pulmonic valve regurgitation is not visualized. No evidence of pulmonic stenosis. Aorta: The aortic root is normal in size and structure. Venous: The inferior vena cava is normal in size with greater than 50% respiratory variability, suggesting right atrial pressure of 3 mmHg. IAS/Shunts: No atrial level  shunt detected by color flow Doppler. Additional Comments: 3D was performed not requiring image post processing on an independent workstation and was indeterminate.  LEFT VENTRICLE PLAX 2D LVIDd:         3.40 cm   Diastology LVIDs:         2.20 cm   LV e' medial:    6.09 cm/s LV PW:         1.20 cm   LV E/e' medial:  11.8 LV IVS:        0.90 cm   LV e' lateral:   5.22 cm/s LVOT diam:     2.10 cm   LV E/e' lateral: 13.8 LV SV:         124 LV SV Index:   73 LVOT Area:     3.46 cm  RIGHT VENTRICLE RV Basal diam:  3.30 cm RV Mid diam:    2.20 cm RV S prime:     14.30 cm/s TAPSE (M-mode): 2.0 cm LEFT ATRIUM             Index        RIGHT ATRIUM          Index LA diam:        2.90 cm 1.70 cm/m   RA Area:     6.00 cm LA Vol (A2C):   35.4 ml 20.70 ml/m  RA Volume:   9.08 ml  5.31 ml/m LA Vol (A4C):   27.0 ml 15.79 ml/m LA Biplane Vol: 33.4 ml 19.53 ml/m  AORTIC VALVE AV Area (Vmax):    3.48 cm AV Area (Vmean):   3.77 cm AV Area (VTI):     3.81 cm AV Vmax:           196.00 cm/s AV Vmean:          140.500 cm/s AV VTI:            0.326 m AV Peak Grad:      15.4 mmHg AV Mean Grad:      9.5 mmHg LVOT Vmax:         197.00 cm/s LVOT Vmean:        153.000 cm/s LVOT VTI:  0.358 m LVOT/AV VTI ratio: 1.10  AORTA Ao Root diam: 3.00 cm MITRAL VALVE                TRICUSPID VALVE MV Area (PHT): 4.33 cm     TR Peak grad:   26.0 mmHg MV Area VTI:   4.59 cm     TR Vmax:        255.00 cm/s MV Peak grad:  8.0 mmHg MV Mean grad:  4.0 mmHg     SHUNTS MV Vmax:       1.41 m/s     Systemic VTI:  0.36 m MV Vmean:      91.8 cm/s    Systemic Diam: 2.10 cm MV Decel Time: 175 msec MV E velocity: 71.80 cm/s MV A velocity: 114.00 cm/s MV E/A ratio:  0.63 Evalene Lunger MD Electronically signed by Evalene Lunger MD Signature Date/Time: 11/09/2023/3:45:43 PM    Final    NM Pulmonary Perfusion Result Date: 11/08/2023 EXAM: NM Lung Perfusion Scan. CLINICAL HISTORY: Pulmonary embolism (PE) suspected, low to intermediate probability,  negative D-dimer. Subacute cough, shortness of breath. TECHNIQUE: Radiolabeled MAA was administered intravenously and planar images of the lungs were obtained in multiple projections. RADIOPHARMACEUTICAL: 4.41 mCi Technetium-87m albumin aggregated (MAA) injection solution. COMPARISON: Chest radiograph 11/08/2023. FINDINGS: PERFUSION: There are 2 wedge-shaped peripheral perfusion defects in the right middle lobe. There is overall poor regional perfusion to the left lower lobe. This poor perfusion is greater than the effusion seen on comparison radiograph. IMPRESSION: 1. Two wedge-shaped peripheral perfusion defects in the right middle lobe, suspicious for pulmonary emboli. 2. Overall poor regional perfusion to the left lower lobe, out of proportion to the small effusion on chest radiograph, raising concern for additional embolic involvement. 3. Overall concern for bilateral acute Pulmonary Emboli.  high probability. These results will be called to the ordering clinician or representative by the radiologist assistant, and communication documented in the pacs or clario dashboard Electronically signed by: Norleen Boxer MD 11/08/2023 02:53 PM EDT RP Workstation: [id number]   US  Venous Img Lower Bilateral (DVT) Result Date: 11/08/2023 EXAM: ULTRASOUND DUPLEX OF THE BILATERAL LOWER EXTREMITY VEINS TECHNIQUE: Duplex ultrasound using B-mode/gray scaled imaging and Doppler spectral analysis and color flow was obtained of the deep venous structures of the bilateral lower extremity. COMPARISON: CT left lower extremity 05/22/2023. CLINICAL HISTORY: DVT. FINDINGS: LEFT: The common femoral vein, femoral vein, popliteal vein, and posterior tibial vein of the left lower extremity demonstrate normal compressibility with normal color flow and spectral analysis. RIGHT: The common femoral vein, femoral vein, popliteal vein, and posterior tibial vein of the right lower extremity demonstrate normal compressibility with normal color flow  and spectral analysis. IMPRESSION: 1. No evidence of deep venous thrombosis in the evaluated lower extremity veins. Electronically signed by: Norleen Boxer MD 11/08/2023 02:44 PM EDT RP Workstation: [id number]   DG Chest 2 View Result Date: 11/08/2023 CLINICAL DATA:  shortness of breath EXAM: CHEST - 2 VIEW COMPARISON:  03/20/2018 FINDINGS: Small to moderate volume left pleural effusion. Left basilar airspace opacities, likely atelectasis. No pneumothorax. No cardiomegaly. Aortic atherosclerosis. Diffuse osteopenia. No acute fracture or destructive lesions. Multilevel thoracic osteophytosis. IMPRESSION: Small to moderate volume left pleural effusion with left basilar atelectasis. Electronically Signed   By: Rogelia Myers M.D.   On: 11/08/2023 12:28    Microbiology: No results found for this or any previous visit (from the past 240 hours).   Labs: Basic Metabolic Panel: Recent Labs  Lab 11/24/23 0705 11/25/23 0518  11/26/23 0532 11/27/23 0359  NA 130* 129* 129* 127*  K 5.5* 5.0 5.0 4.7  CL 98 94* 98 96*  CO2 26 23 23 24   GLUCOSE 104* 123* 119* 113*  BUN 31* 36* 39* 37*  CREATININE 0.81 0.78 0.86 0.71  CALCIUM  7.8* 8.0* 7.9* 7.9*   Liver Function Tests: No results for input(s): AST, ALT, ALKPHOS, BILITOT, PROT, ALBUMIN in the last 168 hours. No results for input(s): LIPASE, AMYLASE in the last 168 hours. No results for input(s): AMMONIA in the last 168 hours. CBC: Recent Labs  Lab 11/24/23 0705 11/25/23 0518 11/26/23 0532 11/27/23 0359  WBC 6.7 8.2 8.0 8.5  HGB 6.7* 9.0* 8.7* 8.2*  HCT 21.2* 27.8* 27.0* 25.1*  MCV 90.2 87.4 88.8 90.3  PLT 402* 460* 458* 477*   Cardiac Enzymes: No results for input(s): CKTOTAL, CKMB, CKMBINDEX, TROPONINI in the last 168 hours. BNP: BNP (last 3 results) No results for input(s): BNP in the last 8760 hours.  ProBNP (last 3 results) No results for input(s): PROBNP in the last 8760 hours.  CBG: No results for  input(s): GLUCAP in the last 168 hours.     Signed:  Devaughn KATHEE Ban MD.  [facility] 11/28/2023, 10:39 AM

## 2023-11-28 NOTE — Plan of Care (Signed)
   Problem: Safety: Goal: Ability to remain free from injury will improve Outcome: Progressing

## 2023-11-28 NOTE — TOC Transition Note (Signed)
 Transition of Care [facility]) - Discharge Note   Patient Details  Name: Terri Braun MRN: [id number] Date of Birth: [birth year]  [facility] CM/SW Contact:  Alvaro Louder, LCSW Phone Number: 11/28/2023, 11:44 AM   Clinical Narrative:   ISRAEL confirmed with MD that patient is stable for discharge. LCSWA notified the patient's Daughter [name] and they are in agreement with discharge. LCSWA confirmed bed is available at [facility] for LTC. Transport arranged with [facility] for next available.  RM E-10, Number to call report: [phone]   [facility] signing off         Patient Goals and CMS Choice            Discharge Placement                       Discharge Plan and Services Additional resources added to the After Visit Summary for                                       Social Drivers of Health (SDOH) Interventions SDOH Screenings   Food Insecurity: No Food Insecurity (11/10/2023)  Housing: Low Risk  (11/10/2023)  Transportation Needs: No Transportation Needs (11/10/2023)  Utilities: Not At Risk (11/10/2023)  Alcohol Screen: Low Risk  (09/29/2021)  Depression (PHQ2-9): Low Risk  (09/29/2021)  Financial Resource Strain: Low Risk  (08/11/2023)   Received from [facility]  Physical Activity: Insufficiently Active (09/29/2021)  Social Connections: Moderately Isolated (11/10/2023)  Stress: No Stress Concern Present (09/29/2021)  Tobacco Use: Low Risk  (11/08/2023)     Readmission Risk Interventions     No data to display

## 2024-01-01 DEATH — deceased
